# Patient Record
Sex: Female | Born: 1965
Health system: Southern US, Community
[De-identification: ages and names within clinical notes are randomized; demographics above are authoritative.]

## PROBLEM LIST (undated history)

## (undated) DIAGNOSIS — T7840XA Allergy, unspecified, initial encounter: Secondary | ICD-10-CM

## (undated) DIAGNOSIS — M1990 Unspecified osteoarthritis, unspecified site: Secondary | ICD-10-CM

## (undated) DIAGNOSIS — J189 Pneumonia, unspecified organism: Secondary | ICD-10-CM

## (undated) DIAGNOSIS — M797 Fibromyalgia: Secondary | ICD-10-CM

## (undated) DIAGNOSIS — I10 Essential (primary) hypertension: Secondary | ICD-10-CM

## (undated) DIAGNOSIS — E785 Hyperlipidemia, unspecified: Secondary | ICD-10-CM

## (undated) DIAGNOSIS — E119 Type 2 diabetes mellitus without complications: Secondary | ICD-10-CM

## (undated) DIAGNOSIS — H269 Unspecified cataract: Secondary | ICD-10-CM

## (undated) DIAGNOSIS — T884XXA Failed or difficult intubation, initial encounter: Secondary | ICD-10-CM

## (undated) DIAGNOSIS — F32A Depression, unspecified: Secondary | ICD-10-CM

## (undated) DIAGNOSIS — K219 Gastro-esophageal reflux disease without esophagitis: Secondary | ICD-10-CM

## (undated) DIAGNOSIS — E039 Hypothyroidism, unspecified: Secondary | ICD-10-CM

## (undated) DIAGNOSIS — J45909 Unspecified asthma, uncomplicated: Secondary | ICD-10-CM

## (undated) DIAGNOSIS — M5412 Radiculopathy, cervical region: Secondary | ICD-10-CM

## (undated) DIAGNOSIS — Z973 Presence of spectacles and contact lenses: Secondary | ICD-10-CM

## (undated) DIAGNOSIS — A4902 Methicillin resistant Staphylococcus aureus infection, unspecified site: Secondary | ICD-10-CM

## (undated) DIAGNOSIS — Z87440 Personal history of urinary (tract) infections: Secondary | ICD-10-CM

## (undated) DIAGNOSIS — IMO0002 Reserved for concepts with insufficient information to code with codable children: Secondary | ICD-10-CM

## (undated) DIAGNOSIS — R42 Dizziness and giddiness: Secondary | ICD-10-CM

## (undated) DIAGNOSIS — F329 Major depressive disorder, single episode, unspecified: Secondary | ICD-10-CM

## (undated) DIAGNOSIS — Z8659 Personal history of other mental and behavioral disorders: Secondary | ICD-10-CM

## (undated) DIAGNOSIS — R32 Unspecified urinary incontinence: Secondary | ICD-10-CM

## (undated) DIAGNOSIS — Z6841 Body Mass Index (BMI) 40.0 and over, adult: Secondary | ICD-10-CM

## (undated) DIAGNOSIS — R9430 Abnormal result of cardiovascular function study, unspecified: Secondary | ICD-10-CM

## (undated) DIAGNOSIS — F4310 Post-traumatic stress disorder, unspecified: Secondary | ICD-10-CM

## (undated) DIAGNOSIS — R200 Anesthesia of skin: Secondary | ICD-10-CM

## (undated) DIAGNOSIS — G4730 Sleep apnea, unspecified: Secondary | ICD-10-CM

## (undated) DIAGNOSIS — E282 Polycystic ovarian syndrome: Secondary | ICD-10-CM

## (undated) DIAGNOSIS — M810 Age-related osteoporosis without current pathological fracture: Secondary | ICD-10-CM

## (undated) DIAGNOSIS — R251 Tremor, unspecified: Secondary | ICD-10-CM

## (undated) DIAGNOSIS — R51 Headache: Secondary | ICD-10-CM

## (undated) DIAGNOSIS — N3281 Overactive bladder: Secondary | ICD-10-CM

## (undated) DIAGNOSIS — F419 Anxiety disorder, unspecified: Secondary | ICD-10-CM

## (undated) DIAGNOSIS — R000 Tachycardia, unspecified: Secondary | ICD-10-CM

## (undated) DIAGNOSIS — S060X9A Concussion with loss of consciousness of unspecified duration, initial encounter: Secondary | ICD-10-CM

## (undated) HISTORY — DX: Hypothyroidism, unspecified: E03.9

## (undated) HISTORY — DX: Anxiety disorder, unspecified: F41.9

## (undated) HISTORY — DX: Allergy, unspecified, initial encounter: T78.40XA

## (undated) HISTORY — DX: Fibromyalgia: M79.7

## (undated) HISTORY — DX: Depression, unspecified: F32.A

## (undated) HISTORY — DX: Age-related osteoporosis without current pathological fracture: M81.0

## (undated) HISTORY — DX: Tachycardia, unspecified: R00.0

## (undated) HISTORY — PX: COLONOSCOPY W/ BIOPSIES AND POLYPECTOMY: SHX1376

## (undated) HISTORY — PX: CHOLECYSTECTOMY: SHX55

## (undated) HISTORY — PX: CATARACT EXTRACTION, BILATERAL: SHX1313

## (undated) HISTORY — PX: CARPAL TUNNEL RELEASE: SHX101

## (undated) HISTORY — PX: EYE SURGERY: SHX253

## (undated) HISTORY — DX: Dizziness and giddiness: R42

## (undated) HISTORY — DX: Type 2 diabetes mellitus without complications: E11.9

## (undated) HISTORY — PX: BUNIONECTOMY: SHX129

## (undated) HISTORY — DX: Hyperlipidemia, unspecified: E78.5

## (undated) HISTORY — DX: Reserved for concepts with insufficient information to code with codable children: IMO0002

## (undated) HISTORY — PX: WISDOM TOOTH EXTRACTION: SHX21

## (undated) HISTORY — PX: STERIOD INJECTION: SHX5046

## (undated) HISTORY — DX: Major depressive disorder, single episode, unspecified: F32.9

## (undated) HISTORY — PX: TONSILLECTOMY: SUR1361

## (undated) HISTORY — DX: Abnormal result of cardiovascular function study, unspecified: R94.30

---
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
## 1999-04-06 ENCOUNTER — Emergency Department (HOSPITAL_COMMUNITY): Admission: EM | Admit: 1999-04-06 | Discharge: 1999-04-06 | Payer: Self-pay | Admitting: Emergency Medicine

## 1999-07-04 ENCOUNTER — Encounter: Admission: RE | Admit: 1999-07-04 | Discharge: 1999-07-06 | Payer: Self-pay | Admitting: Anesthesiology

## 2000-04-18 ENCOUNTER — Other Ambulatory Visit: Admission: RE | Admit: 2000-04-18 | Discharge: 2000-04-18 | Payer: Self-pay | Admitting: Obstetrics and Gynecology

## 2000-09-05 ENCOUNTER — Encounter (INDEPENDENT_AMBULATORY_CARE_PROVIDER_SITE_OTHER): Payer: Self-pay | Admitting: Specialist

## 2000-09-05 ENCOUNTER — Encounter: Payer: Self-pay | Admitting: Emergency Medicine

## 2000-09-05 ENCOUNTER — Encounter: Payer: Self-pay | Admitting: General Surgery

## 2000-09-05 ENCOUNTER — Inpatient Hospital Stay (HOSPITAL_COMMUNITY): Admission: EM | Admit: 2000-09-05 | Discharge: 2000-09-07 | Payer: Self-pay | Admitting: Emergency Medicine

## 2000-09-06 ENCOUNTER — Encounter: Payer: Self-pay | Admitting: General Surgery

## 2001-04-23 ENCOUNTER — Other Ambulatory Visit: Admission: RE | Admit: 2001-04-23 | Discharge: 2001-04-23 | Payer: Self-pay | Admitting: Obstetrics and Gynecology

## 2001-08-05 ENCOUNTER — Encounter: Payer: Self-pay | Admitting: Internal Medicine

## 2001-08-05 ENCOUNTER — Encounter: Admission: RE | Admit: 2001-08-05 | Discharge: 2001-08-05 | Payer: Self-pay | Admitting: Internal Medicine

## 2001-09-05 ENCOUNTER — Encounter: Admission: RE | Admit: 2001-09-05 | Discharge: 2001-12-04 | Payer: Self-pay | Admitting: Obstetrics and Gynecology

## 2002-01-06 ENCOUNTER — Encounter: Admission: RE | Admit: 2002-01-06 | Discharge: 2002-04-06 | Payer: Self-pay | Admitting: Obstetrics and Gynecology

## 2002-04-28 ENCOUNTER — Other Ambulatory Visit: Admission: RE | Admit: 2002-04-28 | Discharge: 2002-04-28 | Payer: Self-pay | Admitting: Obstetrics and Gynecology

## 2002-10-31 ENCOUNTER — Encounter: Admission: RE | Admit: 2002-10-31 | Discharge: 2002-10-31 | Payer: Self-pay | Admitting: Internal Medicine

## 2002-10-31 ENCOUNTER — Encounter: Payer: Self-pay | Admitting: Internal Medicine

## 2004-08-12 ENCOUNTER — Ambulatory Visit (HOSPITAL_COMMUNITY): Admission: RE | Admit: 2004-08-12 | Discharge: 2004-08-12 | Payer: Self-pay | Admitting: Family Medicine

## 2005-11-29 ENCOUNTER — Other Ambulatory Visit: Admission: RE | Admit: 2005-11-29 | Discharge: 2005-11-29 | Payer: Self-pay | Admitting: Gynecology

## 2007-09-05 ENCOUNTER — Inpatient Hospital Stay (HOSPITAL_COMMUNITY): Admission: EM | Admit: 2007-09-05 | Discharge: 2007-09-07 | Payer: Self-pay | Admitting: Emergency Medicine

## 2009-03-10 ENCOUNTER — Ambulatory Visit (HOSPITAL_COMMUNITY): Admission: RE | Admit: 2009-03-10 | Discharge: 2009-03-10 | Payer: Self-pay | Admitting: Family Medicine

## 2009-05-13 ENCOUNTER — Other Ambulatory Visit: Admission: RE | Admit: 2009-05-13 | Discharge: 2009-05-13 | Payer: Self-pay | Admitting: Family Medicine

## 2009-11-04 ENCOUNTER — Emergency Department (HOSPITAL_COMMUNITY): Admission: EM | Admit: 2009-11-04 | Discharge: 2009-11-04 | Payer: Self-pay | Admitting: Emergency Medicine

## 2010-03-29 ENCOUNTER — Ambulatory Visit (HOSPITAL_COMMUNITY): Admission: RE | Admit: 2010-03-29 | Discharge: 2010-03-29 | Payer: Self-pay | Admitting: Family Medicine

## 2010-06-01 ENCOUNTER — Ambulatory Visit (HOSPITAL_BASED_OUTPATIENT_CLINIC_OR_DEPARTMENT_OTHER): Admission: RE | Admit: 2010-06-01 | Discharge: 2010-06-01 | Payer: Self-pay | Admitting: Orthopedic Surgery

## 2010-09-28 LAB — POCT HEMOGLOBIN-HEMACUE: Hemoglobin: 15 g/dL (ref 12.0–15.0)

## 2010-10-04 LAB — URINALYSIS, ROUTINE W REFLEX MICROSCOPIC
Bilirubin Urine: NEGATIVE
Glucose, UA: NEGATIVE mg/dL
Specific Gravity, Urine: 1.019 (ref 1.005–1.030)
Urobilinogen, UA: 0.2 mg/dL (ref 0.0–1.0)

## 2010-10-04 LAB — DIFFERENTIAL
Basophils Absolute: 0 10*3/uL (ref 0.0–0.1)
Basophils Relative: 0 % (ref 0–1)
Eosinophils Absolute: 0.1 10*3/uL (ref 0.0–0.7)
Eosinophils Relative: 2 % (ref 0–5)
Lymphocytes Relative: 24 % (ref 12–46)
Lymphs Abs: 1.8 10*3/uL (ref 0.7–4.0)
Monocytes Absolute: 0.5 10*3/uL (ref 0.1–1.0)
Monocytes Relative: 7 % (ref 3–12)

## 2010-10-04 LAB — POCT I-STAT, CHEM 8
Calcium, Ion: 1.19 mmol/L (ref 1.12–1.32)
HCT: 44 % (ref 36.0–46.0)

## 2010-10-04 LAB — CBC
RBC: 4.61 MIL/uL (ref 3.87–5.11)
WBC: 7.6 10*3/uL (ref 4.0–10.5)

## 2010-10-04 LAB — URINE MICROSCOPIC-ADD ON
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
## 2010-12-02 NOTE — Op Note (Signed)
[place]. [facility]  Patient:    [name], [name]                       MRN: [id number] Proc. Date: 09/05/00 Adm. Date:  [id number] Attending:  Cherylynn Ridges                           Operative Report  PREOPERATIVE DIAGNOSIS:  Acute cholecystitis and biliary colic.  POSTOPERATIVE DIAGNOSIS:  Acute cholecystitis and biliary colic with retained common bile duct stone in the distal common bile duct.  OPERATION PERFORMED:  Laparoscopic cholecystectomy with intraoperative cholangiogram.  SURGEON:  Jimmye Norman, M.D.  ASSISTANT:  Donnie Coffin. Samuella Cota, M.D.  ANESTHESIA:  General endotracheal.  ESTIMATED BLOOD LOSS:  Less than 28 to 25 cc.  COMPLICATIONS:  Retained common bile duct stone with obstruction of the distal common bile duct.  CONDITION:  Stable.  INDICATIONS FOR PROCEDURE:  The patient is 35.  Has abdominal pain with mildly elevated liver function tests.  She now comes in for laparoscopic cholecystectomy through the emergency room.  FINDINGS:  The patients gallbladder was mildly edematous and large, was perforated during extrication.  Intraoperative cholangiogram demonstrates a distal common duct stone with meniscus sign and no entry into the duodenum.  DESCRIPTION OF PROCEDURE:  The patient was taken to the operating room and placed on the table in supine position.  After adequate general anesthetic was administered, she was prepped and draped in the usual sterile manner exposing the midline and the right upper quadrant of the abdomen.  She was placed in reverse Trendelenburg position and a curvilinear superumbilical incision was made down to the midline fascia.  Through this fascia Veress needle was passed into the peritoneal cavity and confirmed to be in position using saline test. Subsequently, carbon dioxide insufflation up to a maximum intra-abdominal pressure 15 mmHg was instilled into the peritoneal cavity.  Once this was done a 10-11 mm  trocar and cannula was passed through the superumbilical fascia into the peritoneal cavity and confirmed to be in adequate position using the laparoscope with attached camera and light source.  Once this was done, two right costal margin 5 mm cannulas and a subxiphoid 10-11 mm cannula were passed into the peritoneal cavity under direct vision.  Once this was done, the patient was placed in steeper reversed Trendelenburg position.  The left side was tilted down and the dissection begun.  The dome of the gallbladder was grasped and retracted to the anterior abdominal wall towards the right upper quadrant.  There were some lateral omental adhesions to the liver edge which were take down using electrocautery. We dissected out the infundibulum and the peritoneum overlying the triangle of Calot and the hepatoduodenal triangle in order to expose the cystic duct and the cystic artery.  The cystic duct did appear to be mildly enlarged at approximately 8 to 9 mm in size and just the full length of an Endoclip.  We were able to dissect out the duct and after we had done so, we placed a clip on the proximal part of the duct and then subsequently performed a cholecystodochotomy in order to pass a Reddick catheter for cholangiogram. The Reddick catheter was passed through a catheter on the anterior abdominal wall, passed into the cholecystodochotomy and cholangiogram performed.  This demonstrated the lack of flow into the duodenum and the meniscus sign of the distal common bile duct.  The  common bile duct itself did not appear to be markedly enlarged.  The proximal biliary tree appeared to be normal.  This appeared to be an impacted stone and one that would not be amenable to endoscopic removal. Therefore the decision was made to triply clip the cystic duct which was done after removing the Reddick catheter.  We then transected and dissected out the cystic artery which was endoclipped proximally  and distally.  Then we dissected the gallbladder out of its bed using electrocautery.  There was entry into the gallbladder during this procedure with drainage of mucous bile but no pus.  We irrigated out most of the biliary drainage from the gallbladder.  Then we passed an Endocatch back into the peritoneal cavity which we used in order to remove the gallbladder.  This was done with minimal difficulty and no need to enlarge the incisions.  Once we had done so, we inspected the gallbladder bed for hemostasis and it appeared to be adequately clotted.  We irrigated with 2L of saline and subsequently removed all cannulas allowing the gas to escape through the largest cannula.   We reapproximated the fascia at the superumbilical site using a figure-of-eight stitch of 0 Vicryl passed on a UR-6 needle.  Once this was done, the skin was closed using running subcuticular suture of 4-0 Vicryl. Sterile dressings were applied.  Sponge, needle and instrument counts were correct. DD:  09/05/00 TD:  09/05/00 Job: [id number]

## 2010-12-02 NOTE — Discharge Summary (Signed)
[place]. [facility]  Patient:    [name], [name]                       MRN: [id number] Adm. Date:  [id number] Disc. Date: [id number] Attending:  Cherylynn Ridges CC:         Barbette Hair. Arlyce Dice, M.D. [facility]   Discharge Summary  DISCHARGE DIAGNOSES: 1. Acute cholecystitis with common bile duct stone. 2. Mild cholangitis.  PRINCIPAL PROCEDURES:  Laparoscopic cholecystectomy with intraoperative cholangiogram and postoperative endoscopic retrograde cholangiopancreatography with retrieval of common bile duct stone.  GASTROENTEROLOGIST:  Barbette Hair. Arlyce Dice, M.D.  DISCHARGE MEDICATIONS:  She was discharged to home on Vicodin one to two every four hours p.r.n. for pain.  FOLLOW-UP:  To see me in two weeks.  Also, she had a follow-up with Barbette Hair. Arlyce Dice, M.D.  BRIEF SUMMARY OF HOSPITAL COURSE:  The patient was admitted through the emergency room with abdominal pain over the last four to five days, associated with nausea and vomiting.  No fever or chills.  She is a heavy-set lady.  She was admitted for cholecystectomy.  Her SGOT and SGPT were elevated, but the alkaline phosphatase and total bilirubin were normal.  She was taken to surgery where a laparoscopic cholecystectomy was performed uneventfully. Intraoperative cholangiogram demonstrated a distal common duct obstruction. She received a postoperative ERCP by Barbette Hair. Arlyce Dice, M.D., that retrieved with stone.  She did well subsequently and was discharged to home in two days. DD:  10/27/00 TD:  10/27/00 Job: [id number]

## 2010-12-02 NOTE — H&P (Signed)
[place]. [facility]  Patient:    [name], [name]                       MRN: [id number] Adm. Date:  [id number] Attending:  Cherylynn Ridges                         History and Physical  IDENTIFICATION/CHIEF COMPLAINT:  The patient is a 45 year old female with symptomatic cholelithiasis, possible subacute cholecystitis.  HISTORY OF PRESENT ILLNESS:  This is the fourth or fifth attack over the last several days of acute, severe right upper quadrant pain presenting in the same way with severe pain while the patient was asleep and awakening her early in the morning.  She has had nausea and vomiting associated with that, along with some possible chills but no documented fevers.  She has had no jaundice but did have some diarrhea yesterday which was bilious.  Her bowel movement today was normal. Her last meal, however, was yesterday evening at 7 p.m.  She has had no previous diagnosis of gallbladder disease; however, an ultrasound today showed multiple gallstones in her gallbladder along with a thickened gallbladder wall.  Her ducts were normal.  A surgical consultation was obtained after the ultrasound results were brought out.  PAST MEDICAL HISTORY: 1. Infertility. 2. She is hypothyroid. 3. Obesity.  MEDICATIONS: 1. Glucophage 500 mg p.o. b.i.d. - which she is apparently taking for    infertility, not for diabetes. 2. Synthroid. 3. Prenatal vitamins.  GYNECOLOGIC HISTORY:  She is currently not pregnant.  We will send a beta HCG but she feels as though she is not pregnant.  She has had infertility problems for years.  ALLERGIES:  PREDNISONE - she gets a rash.  REVIEW OF SYSTEMS:  She has had no constipation but did have diarrhea yesterday, possible chills.  No fevers documented, no jaundice, no blood in her urine.  She is currently on her period.  PHYSICAL EXAMINATION:  GENERAL:  She is a well-nourished obese female in mild acute distress but tender  in the right upper quadrant.  HEENT:  She is normocephalic and atraumatic.  VITAL SIGNS:  Stable.  She is afebrile.  NECK:  Supple.  No palpable masses, no bruits.  CHEST:  Clear to auscultation.  CARDIAC:  Regular rhythm and rate with no murmurs, gallops, lifts, or heaves.  ABDOMEN:  Soft but she is very tender in the right upper quadrant epigastric area with no palpable masses, no rebound or guarding.  She has hypoactive bowel sounds.  RECTAL AND PELVIC:  Deferred.  LABORATORY STUDIES:  Show a white count of 11.8, hematocrit 41%, platelet count normal.  Liver function tests are abnormal with an SGOT of 115, SGPT 88. Total bili is normal at 0.7.  Electrolytes are normal.  A UA is pending along with a urine beta HCG.  She does not have a chest x-ray or EKG.  She is a nonsmoker and nondrinker and has no risk factors for cardiac disease outside of her obesity.  IMPRESSION:  Symptomatic cholelithiasis, possible subacute cholecystitis in an obese 45 year old female with abdominal pain.  Ultrasound demonstrates a thickened gallbladder wall along with stones.  With this scenario, likely she has acute gallbladder disease that requires operative intervention.  Her obesity presents somewhat of a problem logistically and surgically; however, I suspect she should be a good candidate for a laparoscopic cholecystectomy.  It is unsure whether or not  she really has diabetes.  Will check a hemoglobin A1c.  PLAN:  Admit the patient, give her IV antibiotics, and take her to surgery for a laparoscopic cholecystectomy.  Intraoperative cholangiograms will be performed at the time of the laparoscopic cholecystectomy.  There is a possibility we may need to open for this procedure because of the amount of inflammation of the gallbladder. DD:  09/05/00 TD:  09/05/00 Job: [id number]

## 2010-12-19 ENCOUNTER — Inpatient Hospital Stay (INDEPENDENT_AMBULATORY_CARE_PROVIDER_SITE_OTHER)
Admission: RE | Admit: 2010-12-19 | Discharge: 2010-12-19 | Disposition: A | Discharge status: Home / Self Care | Payer: 59 | Source: Ambulatory Visit | Attending: Family Medicine | Admitting: Family Medicine

## 2010-12-19 DIAGNOSIS — L255 Unspecified contact dermatitis due to plants, except food: Secondary | ICD-10-CM

## 2011-02-09 ENCOUNTER — Inpatient Hospital Stay (INDEPENDENT_AMBULATORY_CARE_PROVIDER_SITE_OTHER)
Admission: RE | Admit: 2011-02-09 | Discharge: 2011-02-09 | Disposition: A | Discharge status: Home / Self Care | Payer: 59 | Source: Ambulatory Visit | Attending: Family Medicine | Admitting: Family Medicine

## 2011-02-09 DIAGNOSIS — M549 Dorsalgia, unspecified: Secondary | ICD-10-CM

## 2011-04-10 LAB — CBC
HCT: 37.3
Hemoglobin: 8.3 — ABNORMAL LOW
MCHC: 34.2
MCV: 84.7
Platelets: 291
RBC: 2.84 — ABNORMAL LOW
RBC: 4.4
RDW: 14.7
WBC: 9.3

## 2011-04-10 LAB — TROPONIN I: Troponin I: 0.02

## 2011-04-10 LAB — DIFFERENTIAL
Basophils Absolute: 0
Lymphocytes Relative: 22
Lymphs Abs: 2
Monocytes Absolute: 0.6
Monocytes Relative: 6
Neutro Abs: 6.5
Neutrophils Relative %: 70

## 2011-04-10 LAB — CK TOTAL AND CKMB (NOT AT ARMC)
CK, MB: 3.5
Relative Index: 2.7 — ABNORMAL HIGH
Total CK: 131

## 2011-04-10 LAB — I-STAT 8, (EC8 V) (CONVERTED LAB)
Acid-Base Excess: 2
Bicarbonate: 23.8
Hemoglobin: 13.9
Potassium: 3.8
Sodium: 137
TCO2: 25
pCO2, Ven: 30.3 — ABNORMAL LOW

## 2011-04-10 LAB — CROSSMATCH: ABO/RH(D): O NEG

## 2011-04-10 LAB — BASIC METABOLIC PANEL
CO2: 28
Calcium: 7.7 — ABNORMAL LOW
Creatinine, Ser: 0.8
GFR calc Af Amer: >60
GFR calc non Af Amer: >60
Sodium: 133 — ABNORMAL LOW

## 2011-05-11 ENCOUNTER — Emergency Department (HOSPITAL_COMMUNITY): Payer: 59

## 2011-05-11 ENCOUNTER — Emergency Department (HOSPITAL_COMMUNITY)
Admission: EM | Admit: 2011-05-11 | Discharge: 2011-05-11 | Disposition: A | Discharge status: Home / Self Care | Payer: 59 | Attending: Emergency Medicine | Admitting: Emergency Medicine

## 2011-05-11 DIAGNOSIS — E785 Hyperlipidemia, unspecified: Secondary | ICD-10-CM | POA: Insufficient documentation

## 2011-05-11 DIAGNOSIS — R42 Dizziness and giddiness: Secondary | ICD-10-CM | POA: Insufficient documentation

## 2011-05-11 DIAGNOSIS — E039 Hypothyroidism, unspecified: Secondary | ICD-10-CM | POA: Insufficient documentation

## 2011-05-11 DIAGNOSIS — M79609 Pain in unspecified limb: Secondary | ICD-10-CM | POA: Insufficient documentation

## 2011-05-11 DIAGNOSIS — F329 Major depressive disorder, single episode, unspecified: Secondary | ICD-10-CM | POA: Insufficient documentation

## 2011-05-11 DIAGNOSIS — F3289 Other specified depressive episodes: Secondary | ICD-10-CM | POA: Insufficient documentation

## 2011-05-11 DIAGNOSIS — R072 Precordial pain: Secondary | ICD-10-CM | POA: Insufficient documentation

## 2011-05-11 DIAGNOSIS — R799 Abnormal finding of blood chemistry, unspecified: Secondary | ICD-10-CM | POA: Insufficient documentation

## 2011-05-11 DIAGNOSIS — Z79899 Other long term (current) drug therapy: Secondary | ICD-10-CM | POA: Insufficient documentation

## 2011-05-11 LAB — URINALYSIS, ROUTINE W REFLEX MICROSCOPIC
Nitrite: NEGATIVE
Specific Gravity, Urine: 1.024 (ref 1.005–1.030)
Urobilinogen, UA: 1 mg/dL (ref 0.0–1.0)
pH: 6 (ref 5.0–8.0)

## 2011-05-11 LAB — CBC
HCT: 43.1 % (ref 36.0–46.0)
Platelets: 213 10*3/uL (ref 150–400)
RBC: 4.79 MIL/uL (ref 3.87–5.11)
RDW: 14 % (ref 11.5–15.5)
WBC: 10.3 10*3/uL (ref 4.0–10.5)

## 2011-05-11 LAB — POCT I-STAT TROPONIN I
Troponin i, poc: 0 ng/mL (ref 0.00–0.08)
Troponin i, poc: 0 ng/mL (ref 0.00–0.08)

## 2011-05-11 LAB — DIFFERENTIAL
Basophils Absolute: 0 10*3/uL (ref 0.0–0.1)
Eosinophils Absolute: 0.1 10*3/uL (ref 0.0–0.7)
Eosinophils Relative: 1 % (ref 0–5)
Lymphocytes Relative: 24 % (ref 12–46)
Lymphs Abs: 2.4 10*3/uL (ref 0.7–4.0)
Neutrophils Relative %: 66 % (ref 43–77)

## 2011-05-11 LAB — BASIC METABOLIC PANEL
Chloride: 102 mEq/L (ref 96–112)
GFR calc Af Amer: >90 mL/min (ref >90)
GFR calc non Af Amer: >90 mL/min (ref >90)
Potassium: 3.5 mEq/L (ref 3.5–5.1)
Sodium: 138 mEq/L (ref 135–145)

## 2011-05-11 LAB — URINE MICROSCOPIC-ADD ON

## 2011-05-11 MED ORDER — IOHEXOL 300 MG/ML  SOLN
100.0000 mL | Freq: Once | INTRAMUSCULAR | Status: AC | PRN
  Administered 2011-05-11: 100 mL via INTRAVENOUS

## 2011-05-15 ENCOUNTER — Encounter: Payer: Self-pay | Admitting: Cardiology

## 2011-05-15 DIAGNOSIS — E785 Hyperlipidemia, unspecified: Secondary | ICD-10-CM | POA: Insufficient documentation

## 2011-05-15 DIAGNOSIS — M797 Fibromyalgia: Secondary | ICD-10-CM | POA: Insufficient documentation

## 2011-05-15 DIAGNOSIS — F329 Major depressive disorder, single episode, unspecified: Secondary | ICD-10-CM | POA: Insufficient documentation

## 2011-05-15 DIAGNOSIS — F419 Anxiety disorder, unspecified: Secondary | ICD-10-CM | POA: Insufficient documentation

## 2011-05-15 DIAGNOSIS — R000 Tachycardia, unspecified: Secondary | ICD-10-CM | POA: Insufficient documentation

## 2011-05-15 DIAGNOSIS — F32A Depression, unspecified: Secondary | ICD-10-CM | POA: Insufficient documentation

## 2011-05-15 DIAGNOSIS — E039 Hypothyroidism, unspecified: Secondary | ICD-10-CM | POA: Insufficient documentation

## 2011-05-15 DIAGNOSIS — R42 Dizziness and giddiness: Secondary | ICD-10-CM | POA: Insufficient documentation

## 2011-05-16 ENCOUNTER — Ambulatory Visit (INDEPENDENT_AMBULATORY_CARE_PROVIDER_SITE_OTHER): Payer: 59

## 2011-05-16 ENCOUNTER — Inpatient Hospital Stay (INDEPENDENT_AMBULATORY_CARE_PROVIDER_SITE_OTHER)
Admission: RE | Admit: 2011-05-16 | Discharge: 2011-05-16 | Disposition: A | Discharge status: Home / Self Care | Payer: 59 | Source: Ambulatory Visit | Attending: Family Medicine | Admitting: Family Medicine

## 2011-05-16 DIAGNOSIS — S93409A Sprain of unspecified ligament of unspecified ankle, initial encounter: Secondary | ICD-10-CM

## 2011-05-17 ENCOUNTER — Encounter: Payer: Self-pay | Admitting: *Deleted

## 2011-05-18 ENCOUNTER — Encounter: Payer: Self-pay | Admitting: Cardiology

## 2011-05-18 ENCOUNTER — Ambulatory Visit (INDEPENDENT_AMBULATORY_CARE_PROVIDER_SITE_OTHER): Payer: 59 | Admitting: Cardiology

## 2011-05-18 DIAGNOSIS — E039 Hypothyroidism, unspecified: Secondary | ICD-10-CM

## 2011-05-18 DIAGNOSIS — R079 Chest pain, unspecified: Secondary | ICD-10-CM

## 2011-05-18 NOTE — Progress Notes (Signed)
HPI Thjs very active patient is here for evaluation of chest pain. Several days ago she noted anterior chest discomfort.  It seems to stay with her continuously. It does not appear to be related to exertion or position.  There is a family history of coronary disease.  The patient is overweight. The patient did have a chest CT revealing no pulmonary embolus.  As part of the evaluation today I reviewed the records sent to me by the patient's primary physician.  Also review the hospital records. Allergies  Allergen Reactions  . Prednisolone     Current Outpatient Prescriptions  Medication Sig Dispense Refill  . clonazePAM (KLONOPIN) 0.5 MG tablet Take 0.5 mg by mouth 2 (two) times daily as needed.        . cyclobenzaprine (FLEXERIL) 10 MG tablet Take 10 mg by mouth 3 (three) times daily as needed.        . DULoxetine (CYMBALTA) 60 MG capsule Take 60 mg by mouth daily.        Marland Kitchen levothyroxine (SYNTHROID, LEVOTHROID) 150 MCG tablet Take 150 mcg by mouth daily.        Marland Kitchen lidocaine (LIDODERM) 5 % Place 1 patch onto the skin daily. Remove & Discard patch within 12 hours or as directed by MD       . loratadine (CLARITIN) 10 MG tablet Take 10 mg by mouth daily.        . naproxen (NAPROSYN) 500 MG tablet Take 500 mg by mouth 2 (two) times daily as needed.        . solifenacin (VESICARE) 5 MG tablet Take 5 mg by mouth daily.       . traMADol (ULTRAM) 50 MG tablet Take 50 mg by mouth every 6 (six) hours as needed. Maximum dose= 8 tablets per day         History   Social History  . Marital Status: Married    Spouse Name: N/A    Number of Children: 0  . Years of Education: N/A   Occupational History  . [employer]   Social History Main Topics  . Smoking status: Never Smoker   . Smokeless tobacco: Not on file  . Alcohol Use: No  . Drug Use: No  . Sexually Active: Not on file   Other Topics Concern  . Not on file   Social History Narrative  . No narrative on  file    Family History  Problem Relation Age of Onset  . Adopted: Yes  . Stroke      Past Medical History  Diagnosis Date  . Chest pain   . Tachycardia   . Anxiety   . Dyslipidemia   . Hypothyroidism   . Depression   . Fibromyalgia   . Dizziness     Past Surgical History  Procedure Date  . Wisdom tooth extraction   . Carpal tunnel release   . Cholecystectomy     ROS  Patient denies fever, chills, headache, sweats, rash, change in vision, change in hearing, cough, nausea vomiting, urinary symptoms.  All other systems are reviewed and are negative. PHYSICAL EXAM Patient is overweight.  Head is atraumatic.  There is no xanthelasma.  No jugular venous distention.  No carotid bruits.  Lungs are clear.  Respiratory effort is not labored.  Cardiac exam reveals S1-S2.  No clicks or significant murmurs.  The abdomen is soft.  There is no peripheral edema.  There are no musculoskeletal deformities.  No skin  rashes. Filed Vitals:   05/18/11 1538  BP: 110/78  Pulse: 102  Height: 5\' 3"  (1.6 m)  Weight: 273 lb (123.832 kg)  SpO2: 97%    EKG is not done today.  I have reviewed the prior tracing and I reviewed the tracing from [facility].  There no significant abnormalities.  ASSESSMENT & PLAN

## 2011-05-18 NOTE — Assessment & Plan Note (Signed)
The patient continues to have chest pain.  We know that she had a CT angiogram of her chest showed no pulmonary embolus.  We need to proceed onward to be sure that there is no evidence of significant coronary disease.  Two-dimensional echo will be very helpful to be sure that she does not have any unusual type of cardiomyopathy.  Pharmacologic stress nuclear scan will help to be sure that there is no sign of ischemia.  This will be ordered and arranged for some period

## 2011-05-18 NOTE — Patient Instructions (Signed)
Your physician recommends that you schedule a follow-up appointment in: 05/31/11 at 9:30am  Your physician has requested that you have an echocardiogram. Echocardiography is a painless test that uses sound waves to create images of your heart. It provides your doctor with information about the size and shape of your heart and how well your heart's chambers and valves are working. This procedure takes approximately one hour. There are no restrictions for this procedure.   Your physician has requested that you have a lexiscan myoview. For further information please visit [email or web address] Please follow instruction sheet, as given.

## 2011-05-18 NOTE — Assessment & Plan Note (Signed)
The patient's thyroid status and being treated.

## 2011-05-23 ENCOUNTER — Ambulatory Visit (HOSPITAL_COMMUNITY)
Admission: RE | Admit: 2011-05-23 | Discharge: 2011-05-23 | Disposition: A | Discharge status: Home / Self Care | Payer: 59 | Attending: Psychiatry | Admitting: Psychiatry

## 2011-05-23 DIAGNOSIS — F3289 Other specified depressive episodes: Secondary | ICD-10-CM | POA: Insufficient documentation

## 2011-05-23 DIAGNOSIS — F329 Major depressive disorder, single episode, unspecified: Secondary | ICD-10-CM | POA: Insufficient documentation
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
## 2011-05-24 ENCOUNTER — Ambulatory Visit (HOSPITAL_COMMUNITY): Payer: 59 | Attending: Cardiovascular Disease | Admitting: Radiology

## 2011-05-24 ENCOUNTER — Other Ambulatory Visit (HOSPITAL_COMMUNITY): Payer: 59

## 2011-05-24 DIAGNOSIS — R0609 Other forms of dyspnea: Secondary | ICD-10-CM | POA: Insufficient documentation

## 2011-05-24 DIAGNOSIS — R42 Dizziness and giddiness: Secondary | ICD-10-CM | POA: Insufficient documentation

## 2011-05-24 DIAGNOSIS — R5381 Other malaise: Secondary | ICD-10-CM | POA: Insufficient documentation

## 2011-05-24 DIAGNOSIS — R0989 Other specified symptoms and signs involving the circulatory and respiratory systems: Secondary | ICD-10-CM | POA: Insufficient documentation

## 2011-05-24 DIAGNOSIS — Z8249 Family history of ischemic heart disease and other diseases of the circulatory system: Secondary | ICD-10-CM | POA: Insufficient documentation

## 2011-05-24 DIAGNOSIS — R0789 Other chest pain: Secondary | ICD-10-CM | POA: Insufficient documentation

## 2011-05-24 DIAGNOSIS — E785 Hyperlipidemia, unspecified: Secondary | ICD-10-CM | POA: Insufficient documentation

## 2011-05-24 DIAGNOSIS — R000 Tachycardia, unspecified: Secondary | ICD-10-CM | POA: Insufficient documentation

## 2011-05-24 MED ORDER — TECHNETIUM TC 99M TETROFOSMIN IV KIT
33.0000 | PACK | Freq: Once | INTRAVENOUS | Status: AC | PRN
  Administered 2011-05-24: 33 via INTRAVENOUS

## 2011-05-24 MED ORDER — REGADENOSON 0.4 MG/5ML IV SOLN
0.4000 mg | Freq: Once | INTRAVENOUS | Status: AC
  Administered 2011-05-24: 0.4 mg via INTRAVENOUS

## 2011-05-24 NOTE — Progress Notes (Signed)
[facility] SITE 3 NUCLEAR MED [address] [place] [address] [phone]  Cardiology Nuclear Med Study  [name] is a 45 y.o. female [id number] 09/25/1965   Nuclear Med Background Indication for Stress Test:  Evaluation for Ischemia and Post Hospital ER 05/11/11 CP, Neg. Enzymes History:  No previous documented CAD Cardiac Risk Factors: Family History - CAD and Lipids  Symptoms:  Chest Pain and Chest Pressure ar rest, continuous since 05/11/11 per patient, Dizziness, DOE, Fatigue, Fatigue with Exertion, Light-Headedness and Rapid HR   Nuclear Pre-Procedure Caffeine/Decaff Intake:  None NPO After: 11AM   Lungs:  Clear IV 0.9% NS with Angio Cath:  22g  IV Site: R Antecubital  IV Started by:  Bonnita Levan, RN  Chest Size (in):  52 Cup Size: D  Height: 5\' 3"  (1.6 m)  Weight:  272 lb (123.378 kg)  BMI:  Body mass index is 48.18 kg/(m^2). Tech Comments:  N/A    Nuclear Med Study 1 or 2 day study: 2 day  Stress Test Type:  Treadmill/Lexiscan  Reading MD: Charlton Haws, MD  Order Authorizing Provider:  Willa Rough, MD  Resting Radionuclide: Technetium 24m Tetrofosmin  Resting Radionuclide Dose: 33.0 mCi   Stress Radionuclide:  Technetium 41m Tetrofosmin  Stress Radionuclide Dose: 33.0 mCi           Stress Protocol Rest HR: 96 Stress HR: 144  Rest BP: 114/74 Stress BP: 124/51  Exercise Time (min): 2:00 METS: n/a   Predicted Max HR: 176 bpm % Max HR: 81.82 bpm Rate Pressure Product: 69629   Dose of Adenosine (mg):  n/a Dose of Lexiscan: 0.4 mg  Dose of Atropine (mg): n/a Dose of Dobutamine: n/a mcg/kg/min (at max HR)  Stress Test Technologist: Irean Hong, RN  Nuclear Technologist:  Domenic Polite, CNMT     Rest Procedure:  Myocardial perfusion imaging was performed at rest 45 minutes following the intravenous administration of Technetium 25m Tetrofosmin. Rest ECG: NSR with Poor R wave Progression  Stress Procedure:  The patient  received IV Lexiscan 0.4 mg over 15-seconds with concurrent low level exercise. Technetium 3m Tetrofosmin was injected at 30-seconds while the patient continued walking.There were no significant changes with Lexiscan.  Quantitative spect images were obtained after a 45-minute delay. Stress ECG: No significant change from baseline ECG  QPS Raw Data Images:  Normal; no motion artifact; normal heart/lung ratio. Stress Images:  No diagnostic abnormality Rest Images:  Same as stress. Subtraction (SDS):  No evidence of ischemia. Transient Ischemic Dilatation (Normal <1.22):  1.00 Lung/Heart Ratio (Normal <0.45):  0.28  Quantitative Gated Spect Images QGS EDV:  96 ml QGS ESV:  42 ml QGS cine images:  Normal Wall Motion QGS EF: 56%  Impression Exercise Capacity:  Lexiscan with low level exercise. BP Response:  Normal blood pressure response. Clinical Symptoms:  DOE ECG Impression:  No significant ST segment change suggestive of ischemia. Comparison with Prior Nuclear Study: No previous nuclear study performed  Overall Impression:  Normal stress nuclear study.  Willa Rough

## 2011-05-25 ENCOUNTER — Encounter (HOSPITAL_COMMUNITY): Payer: Self-pay | Admitting: Psychiatry

## 2011-05-25 ENCOUNTER — Other Ambulatory Visit (HOSPITAL_COMMUNITY): Payer: 59 | Admitting: Psychiatry

## 2011-05-25 DIAGNOSIS — F411 Generalized anxiety disorder: Secondary | ICD-10-CM

## 2011-05-25 DIAGNOSIS — F331 Major depressive disorder, recurrent, moderate: Secondary | ICD-10-CM | POA: Insufficient documentation

## 2011-05-25 NOTE — Progress Notes (Signed)
    Daily Group Progress Note  Program: IOP  Group Time: 9:00-10:30 am   Participation Level: Minimal  Behavioral Response: Appropriate  Type of Therapy:  Process Group  Summary of Progress: Today was the patients first day in the group. She was introduced, appeared attentive and observed the group process.      Group Time: 10:30 am - 12:00 pm   Participation Level:  Active  Behavioral Response: Appropriate, Sharing, Motivated and Assertive  Type of Therapy: Education and Training Group  Summary of Progress: Patient actively participated in a discussion and education segment on healthy boundaries. Pt identified personal examples where firmer limits need to be implemented for patients needs to be met.   Maxcine Ham, LCSW
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
## 2011-05-26 ENCOUNTER — Other Ambulatory Visit (HOSPITAL_COMMUNITY): Payer: 59 | Attending: Psychiatry

## 2011-05-26 NOTE — Progress Notes (Signed)
    Daily Group Progress Note  Program: IOP  Group Time:  0900 - 1030  Participation Level: Active  Behavioral Response: Appropriate, Sharing, Care-Taking and Motivated  Type of Therapy:  Process Group  Summary of Progress: Juel Burrow shared the pain of multiple losses in the past 3 years, while accepting the compassion of the group for her losses.  She focused on the loss of a man, neighbor, "who was like a brother" in Jan of 2012, stating she "can't get over it".  She used the opportunity to express her sorrow and her lack of support, even though she told us about her mother and husband as being supportive.  Financial concerns are prominent in spite of the fact that she has inherited the man's farm property and house.  She engaged easily with the group and was well accepted.    Group Time: 1045-1200  Participation Level:  Active  Behavioral Response: Appropriate, Sharing, Motivated and Assertive  Type of Therapy: Psycho-education Group  Summary of Progress: Participated in guided visualization exercise with the opportunity to find an object of significance.  Method explored as a strategy to enhance self-awareness and self-care.   Shonna Chock, APRN, CNS

## 2011-05-29 ENCOUNTER — Encounter: Payer: Self-pay | Admitting: Cardiology

## 2011-05-29 ENCOUNTER — Other Ambulatory Visit (HOSPITAL_COMMUNITY): Payer: 59

## 2011-05-29 ENCOUNTER — Ambulatory Visit (HOSPITAL_COMMUNITY): Payer: 59 | Attending: Cardiology | Admitting: Radiology

## 2011-05-29 ENCOUNTER — Encounter (HOSPITAL_COMMUNITY): Payer: 59 | Admitting: Radiology

## 2011-05-29 ENCOUNTER — Ambulatory Visit (HOSPITAL_BASED_OUTPATIENT_CLINIC_OR_DEPARTMENT_OTHER): Payer: 59 | Admitting: Radiology

## 2011-05-29 DIAGNOSIS — E039 Hypothyroidism, unspecified: Secondary | ICD-10-CM | POA: Insufficient documentation

## 2011-05-29 DIAGNOSIS — R072 Precordial pain: Secondary | ICD-10-CM | POA: Insufficient documentation

## 2011-05-29 DIAGNOSIS — R0989 Other specified symptoms and signs involving the circulatory and respiratory systems: Secondary | ICD-10-CM

## 2011-05-29 DIAGNOSIS — R9430 Abnormal result of cardiovascular function study, unspecified: Secondary | ICD-10-CM | POA: Insufficient documentation

## 2011-05-29 MED ORDER — TECHNETIUM TC 99M TETROFOSMIN IV KIT: 33.0000 | PACK | Freq: Once | INTRAVENOUS | Status: AC | PRN
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
## 2011-05-29 NOTE — Progress Notes (Signed)
    Daily Group Progress Note  Program: IOP  Group Time: 9:00-10:30 am  Participation Level: Minimal  Behavioral Response: Appropriate, Sharing and Motivated  Type of Therapy:  Process Group  Summary of Progress:  Patient was present for the first 30 minutes of group due to having a pre-approved doctor's visit.  Patient expressed an interest in participating for the limited amount of time due to benefiting from the group process.   Group Time: 10:30 am - 12:00 pm  Participation Level:  None  Behavioral Response: Patient did not attend due to not being back from her medical appointment.  Type of Therapy: Grief and Loss  Summary of Progress: Patient did not attend due to not being back from her medication appointment.  Cleophas Dunker, LMFT, CTS

## 2011-05-30 ENCOUNTER — Other Ambulatory Visit (HOSPITAL_COMMUNITY): Payer: 59

## 2011-05-30 NOTE — Progress Notes (Signed)
    Daily Group Progress Note  Program: IOP  Group Time: 9:00-10:30 am   Participation Level: Active  Behavioral Response: Appropriate  Type of Therapy:  Process Group  Summary of Progress: Patient processed feelings of depression and identified how Pt manages uncomfortable feelings while receiving support and encouragement from the group.      Group Time: 10:30 am - 12:00 pm   Participation Level:  Active  Behavioral Response: Appropriate  Type of Therapy: Psycho-education Group  Summary of Progress: Pt participated in a discussion on thoughts, feelings and behaviors and identifying how to distinguish between them and manage difficult feelings.   Dariana Garbett, LCSW 

## 2011-05-31 ENCOUNTER — Ambulatory Visit (INDEPENDENT_AMBULATORY_CARE_PROVIDER_SITE_OTHER): Payer: 59 | Admitting: Cardiology

## 2011-05-31 ENCOUNTER — Encounter: Payer: Self-pay | Admitting: Cardiology

## 2011-05-31 ENCOUNTER — Other Ambulatory Visit (HOSPITAL_COMMUNITY): Payer: 59

## 2011-05-31 ENCOUNTER — Ambulatory Visit: Payer: 59 | Admitting: Cardiology

## 2011-05-31 DIAGNOSIS — R079 Chest pain, unspecified: Secondary | ICD-10-CM | POA: Insufficient documentation

## 2011-05-31 DIAGNOSIS — F419 Anxiety disorder, unspecified: Secondary | ICD-10-CM

## 2011-05-31 NOTE — Patient Instructions (Signed)
Your physician recommends that you schedule a follow-up appointment in: as needed  

## 2011-05-31 NOTE — Assessment & Plan Note (Signed)
The patient's chest pain is improving.  There is no evidence of significant cardiac abnormalities.  She is getting help with her stress.  No further cardiac workup.

## 2011-05-31 NOTE — Assessment & Plan Note (Signed)
The patient has significant stress.  She is having this evaluated and doing well.

## 2011-05-31 NOTE — Assessment & Plan Note (Signed)
The patient has no evidence of significant cardiac disease.  Her nuclear scan reveals no definite ischemia.  No further workup is needed.

## 2011-05-31 NOTE — Progress Notes (Signed)
HPI Patient is seen today for followup evaluation of chest pain.  I had seen her on May 18, 2011.  She has been under enormous stress.  She had significant chest discomfort and a CT scan ruled out pulmonary embolus.  After our full discussion I decided to assess her LV function by echo and rule out ischemia by nuclear scan.  The echo reveals an ejection fraction of 60% with no wall motion abnormalities.  Valves reveal no significant abnormalities.  She had a nuclear pharmacologic stress test that showed no significant ischemia.  It had a good discussion with her today.  She is feeling better.  She is getting health concerning her significant stresses.  She seemed to be quite relieved that her heart tests were good. Allergies  Allergen Reactions  . Prednisolone     Prednisone HIVES.    Current Outpatient Prescriptions  Medication Sig Dispense Refill  . DULoxetine (CYMBALTA) 60 MG capsule Take 60 mg by mouth daily.        . fish oil-omega-3 fatty acids 1000 MG capsule Take 2 g by mouth daily.        . Glucosamine-Chondroit-Vit C-Mn (GLUCOSAMINE 1500 COMPLEX PO) Take 1 tablet by mouth daily.        Marland Kitchen levothyroxine (SYNTHROID, LEVOTHROID) 150 MCG tablet Take 150 mcg by mouth daily.        Marland Kitchen lidocaine (LIDODERM) 5 % Place 1 patch onto the skin daily. Remove & Discard patch within 12 hours or as directed by MD       . loratadine (CLARITIN) 10 MG tablet Take 10 mg by mouth daily.        . solifenacin (VESICARE) 5 MG tablet Take 5 mg by mouth daily.       . clonazePAM (KLONOPIN) 0.5 MG tablet Take 0.5 mg by mouth 2 (two) times daily as needed.        . cyclobenzaprine (FLEXERIL) 10 MG tablet Take 10 mg by mouth 3 (three) times daily as needed.        . naproxen (NAPROSYN) 500 MG tablet Take 500 mg by mouth 2 (two) times daily as needed.        . traMADol (ULTRAM) 50 MG tablet Take 50 mg by mouth every 6 (six) hours as needed. Maximum dose= 8 tablets per day         History   Social History  .  Marital Status: Married    Spouse Name: N/A    Number of Children: 0  . Years of Education: N/A   Occupational History  . [employer] [place]   Social History Main Topics  . Smoking status: Never Smoker   . Smokeless tobacco: Not on file  . Alcohol Use: No  . Drug Use: No  . Sexually Active: Yes   Other Topics Concern  . Not on file   Social History Narrative  . No narrative on file    Family History  Problem Relation Age of Onset  . Adopted: Yes  . Stroke    . Depression Mother     Past Medical History  Diagnosis Date  . Chest pain   . Tachycardia   . Anxiety   . Dyslipidemia   . Hypothyroidism   . Depression   . Fibromyalgia   . Dizziness   . Fibromyalgia muscle pain   . Ejection fraction     EF 60%, echo, 05/2011    Past Surgical History  Procedure Date  . Wisdom  tooth extraction   . Carpal tunnel release   . Cholecystectomy   . Gallbladder surgery     ROS  Patient denies fever, chills, headache, sweats, rash, change in vision, change in hearing, chest pain, cough, nausea vomiting, urinary symptoms.  All other systems are reviewed and are negative.  PHYSICAL EXAM   Vital signs are normal.  Her diastolic blood pressure is mildly elevated.  This needs to be followed over time.  Her pressure was not elevated when I saw her last. Filed Vitals:   05/31/11 1456  BP: 138/94  Pulse: 104  Resp: 18  Height: 5\' 5"  (1.651 m)  Weight: 271 lb 12.8 oz (123.288 kg)    ASSESSMENT & PLAN

## 2011-06-01 ENCOUNTER — Other Ambulatory Visit (HOSPITAL_COMMUNITY): Payer: 59

## 2011-06-01 NOTE — Progress Notes (Signed)
    Daily Group Progress Note  Program: IOP  Group Time: 9:00-10:30 am   Participation Level: Active  Behavioral Response: Appropriate  Type of Therapy:  Process Group  Summary of Progress: Pt reports depression and was tearful. Pt identified her tendency to "overcare" and how this gets in the way of her being healthy.      Group Time: 10:30 am - 12:00 pm   Participation Level:  Active  Behavioral Response: Appropriate  Type of Therapy: Psycho-education Group  Summary of Progress:  Patient participated in an educational discussion on defining anxiety and on how to use different coping strategies to manage anxiety.  Maxcine Ham, LCSW

## 2011-06-02 ENCOUNTER — Other Ambulatory Visit (HOSPITAL_BASED_OUTPATIENT_CLINIC_OR_DEPARTMENT_OTHER): Payer: 59

## 2011-06-02 DIAGNOSIS — F331 Major depressive disorder, recurrent, moderate: Secondary | ICD-10-CM

## 2011-06-02 NOTE — Progress Notes (Unsigned)
   [facility]al Health Follow-up Outpatient Visit  [name] [birth year]  Date: 06-02-11   Subjective Pt seen states gets frustrated easily , over little things. tol meds well. States groups are helping her understand her boundries better, Sleep-improved, app-good. No si/hi. No psychosis. There were no vitals filed for this visit.  Mental Status Examination  Appearance: neat Alert: Yes Attention: good  Cooperative: Yes Eye Contact: Good Speech: normal Psychomotor Activity: Normal Memory/Concentration: fair Oriented: person, place, time/date, situation, day of week, month of year and year Mood: Anxious and Irritable Affect: Congruent Thought Processes and Associations: Linear Fund of Knowledge: Fair Thought Content: No si / hi. No psychosis Insight: Good Judgement: Good  Diagnosis:  Major depressive episode. Anxiety disorder NOS  Treatment Plan:  Cont Cymbalta 160 mg q am and other meds. Increase Risperdal 0.5 mg po bid.  Jahanna Raether.Linet Brash.M.D.  [facility]

## 2011-06-02 NOTE — Progress Notes (Signed)
    Daily Group Progress Note  Program: IOP  Group Time:   0900-1030  Participation Level: Active  Behavioral Response: Appropriate, Sharing and Care-Taking  Type of Therapy:  Process Group  Summary of Progress: [name] was here very early, engaged in reading a recommended book about "Boundaries".  She said she normally did not read anything, but this book has caught her interest because it seems to be all about her.  She had the chance to practice respecting boundaries as well today in group.  She became involved in an intellectual discussion of various chronic illnesses and how they have affected her.  The group and leader exerted some limits and she accepted these well.  She was very much engaged in recognizing the ways in which she has the same as well as different issues than peers.     Group Time: 1045-1200  Participation Level:  Active  Behavioral Response: Appropriate, Sharing, Care-Taking and Motivated  Type of Therapy: Psycho-education Group  Summary of Progress: This group focused on the difficulty people sometimes have in saying good-bye or in ending a relationship, in the context of boundaries.  A number of people got in touch with grief related to recent or distant losses that remain unresolved.  Goodbyes were shared with the group facilitator who will be leaving to have her baby.  This served as an excellent example of how to end the therapeutic relationship.  Jessica Chock, APRN, MS    [facility]

## 2011-06-05 ENCOUNTER — Encounter (HOSPITAL_COMMUNITY): Payer: 59

## 2011-06-05 ENCOUNTER — Other Ambulatory Visit (HOSPITAL_BASED_OUTPATIENT_CLINIC_OR_DEPARTMENT_OTHER): Payer: 59

## 2011-06-05 DIAGNOSIS — F331 Major depressive disorder, recurrent, moderate: Secondary | ICD-10-CM

## 2011-06-05 NOTE — Progress Notes (Signed)
    Daily Group Progress Note  Program: IOP  Group Time: 9:00 - 11:30 a.m.  Participation Level: Active  Behavioral Response: Appropriate, Sharing and Motivated  Type of Therapy:  Process Group  Summary of Progress: Patient shared about ways she copes with her depression including knitting and fishing.  She did state and felt "ok," ( not bad or good) but that she was struggling with putting herself first considering she got a call from her sister-in-law who said the SIL's father died.  She reports struggling with going to group but did show.     Group Time: 11:00 - Noon  Participation Level:  Active  Behavioral Response: Appropriate and Sharing  Type of Therapy: Psycho-education Group  Summary of Progress: Patient attended the grief and loss group.  Cleophas Dunker, LMFT, CTS

## 2011-06-05 NOTE — Progress Notes (Signed)
   [facility] Behavioral Health Follow-up Outpatient Visit  [name] [birth year]  Date: 06-05-11   Subjective: doing ok trying to stay strong for my friend who has lost a family member. There were no vitals filed for this visit.  Mental Status Examination  Appearance: goodAlert: Yes Attention: good  Cooperative: Yes Eye Contact: Good Speech: normal Psychomotor Activity: Normal Memory/Concentration: good Oriented: person, place, time/date, situation, day of week, month of year and year Mood: Anxious Affect: Congruent Thought Processes and Associations: Linear Fund of Knowledge: Good Thought Content: No si/hi/hall/del Insight: Good Judgement: Good  Diagnosis: depression  Treatment Plancont meds and therapy [name].[name].M.D. [facility]

## 2011-06-06 ENCOUNTER — Other Ambulatory Visit (HOSPITAL_COMMUNITY): Payer: 59

## 2011-06-06 NOTE — Progress Notes (Signed)
    Daily Group Progress Note  Program: IOP  Group Time: 9:00 - 10:30 a.m.  Participation Level: Active  Behavioral Response: Appropriate, Sharing, Care-Taking and Motivated  Type of Therapy:  Process Group  Summary of Progress: Patient talked about taking care of her husband who has serious medical problems.  She also tried to solve a few of the other patient's problems for them but stopped herself stating, "I know I'm caretaking right now."  Discussed the idea that it takes time to change behavior.   Group Time: 11:00 - Noon  Participation Level:  Active  Behavioral Response: Appropriate, Sharing and Motivated  Type of Therapy: Psycho-education Group  Summary of Progress: Patient participated in the group on holiday tips for people who have depression, anxiety, and who have lost a loved one.  Patient cried throughout the group stating that Thanksgiving is difficult for her since she lost her best friend and her father.    Cleophas Dunker, LMFT, CTS

## 2011-06-07 ENCOUNTER — Other Ambulatory Visit (HOSPITAL_COMMUNITY): Payer: 59

## 2011-06-07 NOTE — Progress Notes (Signed)
    Daily Group Progress Note  Program: IOP  Group Time: 1000-1200  Participation Level: Active  Behavioral Response: Appropriate, Sharing, Care-Taking and Motivated  Type of Therapy:  Process Group  Summary of Progress: Lauria was very active with helping peers.  She also had a solution to her holiday stress and had already put into action getting an estranged family member to the family celebration.  She has been able to have a good and healing conversation with her mother.    Group Time: 0900-1000  Participation Level:  Active  Behavioral Response: Appropriate, Sharing and Motivated  Type of Therapy: Psycho-education Group  Summary of Progress:   Peggye Fothergill, [address] D., did a Q&A session about psychotropic medications which was well received.   Shonna Chock, APRN, CNS    [facility]

## 2011-06-09 ENCOUNTER — Other Ambulatory Visit (HOSPITAL_COMMUNITY): Payer: 59

## 2011-06-12 ENCOUNTER — Other Ambulatory Visit (HOSPITAL_COMMUNITY): Payer: 59

## 2011-06-12 NOTE — Progress Notes (Signed)
    Daily Group Progress Note  Program: IOP  Group Time: 9:00 - 10:30 a.m.  Participation Level: Active  Behavioral Response: Appropriate, Sharing, Care-Taking and Motivated  Type of Therapy:  Process Group  Summary of Progress: Patient talked about her Thanksgiving weekend and how she coped.  She reports it was better than anticipated because she decided to take part of the weekend and go to the beach with a friend, "in order to take care of me."  Group Time: 11:00 - Noon  Participation Level:  Minimal  Behavioral Response: Appropriate and Sharing  Type of Therapy: Psycho-education Group  Summary of Progress: Patient attended the grief and loss group.  Patient talked about the death of her best friend and the best friend's husband.  Cleophas Dunker, LMFT, CTS

## 2011-06-13 ENCOUNTER — Other Ambulatory Visit (HOSPITAL_BASED_OUTPATIENT_CLINIC_OR_DEPARTMENT_OTHER): Payer: 59 | Admitting: Psychiatry

## 2011-06-13 DIAGNOSIS — F331 Major depressive disorder, recurrent, moderate: Secondary | ICD-10-CM

## 2011-06-13 MED ORDER — RISPERIDONE 1 MG PO TABS: 0.5000 mg | ORAL_TABLET | Freq: Two times a day (BID) | ORAL | Status: DC

## 2011-06-13 NOTE — Progress Notes (Signed)
    Daily Group Progress Note  Program: IOP  Group Time: 9:00 - 10:30 a.m.  Participation Level: Active  Behavioral Response: Appropriate, Sharing, Motivated and Sad and crying  Type of Therapy:  Process Group  Summary of Progress: This is patient's last group.  Was involved in a goodbye ceremony.  Patient cried throughout the ceremony stating she did not feel ready to go however the group told her she did a good job saying goodbye.  Discussed options for patient for when she leaves including going to support groups and staying in touch with the group for support.  Group Time: 11:00 - Noon  Participation Level:  Active  Behavioral Response: Appropriate, Sharing and Motivated    Type of Therapy: Psycho-education Group  Summary of Progress: Patient was involved in group on assertive communication and personal rights.  Patient talked about an argument she had with her husband yesterday but was able to identify that what she needed from him was to acknowledge that she was having a difficult time emotionally.  Cleophas Dunker, LMFT, CTS

## 2011-06-13 NOTE — Patient Instructions (Signed)
Patient completed MH-IOP today.  Denies any SI/HI.  Will follow up with Wenda Overland, LPC ([facility]) on 06-15-11 @ 2pm.  Will follow up with PCP for medication management.  Encouraged support groups.
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
## 2011-06-14 ENCOUNTER — Other Ambulatory Visit (HOSPITAL_COMMUNITY): Payer: 59

## 2011-06-15 ENCOUNTER — Other Ambulatory Visit (HOSPITAL_COMMUNITY): Payer: 59

## 2011-06-16 ENCOUNTER — Other Ambulatory Visit (HOSPITAL_COMMUNITY): Payer: 59

## 2011-06-19 ENCOUNTER — Other Ambulatory Visit (HOSPITAL_COMMUNITY): Payer: 59

## 2011-06-20 ENCOUNTER — Other Ambulatory Visit (HOSPITAL_COMMUNITY): Payer: 59

## 2011-08-16 ENCOUNTER — Emergency Department (INDEPENDENT_AMBULATORY_CARE_PROVIDER_SITE_OTHER)
Admission: EM | Admit: 2011-08-16 | Discharge: 2011-08-16 | Disposition: A | Discharge status: Home / Self Care | Payer: 59 | Source: Home / Self Care | Attending: Family Medicine | Admitting: Family Medicine

## 2011-08-16 ENCOUNTER — Encounter (HOSPITAL_COMMUNITY): Payer: Self-pay | Admitting: *Deleted

## 2011-08-16 DIAGNOSIS — M6789 Other specified disorders of synovium and tendon, multiple sites: Secondary | ICD-10-CM

## 2011-08-16 DIAGNOSIS — M79609 Pain in unspecified limb: Secondary | ICD-10-CM

## 2011-08-16 DIAGNOSIS — M79673 Pain in unspecified foot: Secondary | ICD-10-CM

## 2011-08-16 DIAGNOSIS — G5750 Tarsal tunnel syndrome, unspecified lower limb: Secondary | ICD-10-CM

## 2011-08-16 DIAGNOSIS — M76829 Posterior tibial tendinitis, unspecified leg: Secondary | ICD-10-CM

## 2011-08-16 MED ORDER — NAPROXEN 500 MG PO TABS: 500.0000 mg | ORAL_TABLET | Freq: Two times a day (BID) | ORAL | Status: DC

## 2011-08-16 NOTE — ED Notes (Signed)
Pt with onset of foot pain bilateral left worse than right - this episode onset x one week

## 2011-08-16 NOTE — ED Provider Notes (Signed)
History     CSN: [id number]  Arrival date & time 08/16/11  0809   First MD Initiated Contact with Patient 08/16/11 [phone]      Chief Complaint  Patient presents with  . Foot Pain    (Consider location/radiation/quality/duration/timing/severity/associated sxs/prior treatment) HPI Comments: [name] presents for evaluation of bilateral foot pain, that started over the last few months. She reports worsening over the last week to where she has pain at rest, at night, but mostly with walking. She was seen here a month ago and had an xray which showed some arthritis. She now reports diffuse pain in both feet, mostly around the medial ankle and arch, and some in the forefoot. She denies any injury.   Patient is a 46 y.o. female presenting with lower extremity pain. The history is provided by the patient.  Foot Pain This is a new problem. The current episode started more than 1 week ago. The problem occurs constantly. The problem has been gradually worsening. The symptoms are aggravated by walking and standing. Treatments tried: naproxen. The treatment provided mild relief.    Past Medical History  Diagnosis Date  . Chest pain     Nuclear, November, 2012, no ischemia, normal ejection fraction.  . Tachycardia   . Anxiety   . Dyslipidemia   . Hypothyroidism   . Depression   . Fibromyalgia   . Dizziness   . Fibromyalgia muscle pain   . Ejection fraction     EF 60%, echo, 05/2011  . Bipolar 1 disorder     Past Surgical History  Procedure Date  . Wisdom tooth extraction   . Carpal tunnel release   . Cholecystectomy   . Gallbladder surgery     Family History  Problem Relation Age of Onset  . Adopted: Yes  . Stroke    . Depression Mother     History  Substance Use Topics  . Smoking status: Never Smoker   . Smokeless tobacco: Not on file  . Alcohol Use: No    OB History    Grav Para Term Preterm Abortions TAB SAB Ect Mult Living                  Review of Systems    Constitutional: Negative.   HENT: Negative.   Eyes: Negative.   Respiratory: Negative.   Cardiovascular: Negative.   Gastrointestinal: Negative.   Genitourinary: Negative.   Musculoskeletal: Positive for arthralgias and gait problem.       Bilateral foot pain  Skin: Negative.   Neurological: Negative.     Allergies  Prednisolone  Home Medications   Current Outpatient Rx  Name Route Sig Dispense Refill  . ARIPIPRAZOLE 10 MG PO TABS Oral Take 10 mg by mouth daily.    Marland Kitchen CLONAZEPAM 0.5 MG PO TABS Oral Take 0.5 mg by mouth 2 (two) times daily as needed.      . DULOXETINE HCL 60 MG PO CPEP Oral Take 60 mg by mouth daily.      . OMEGA-3 FATTY ACIDS 1000 MG PO CAPS Oral Take 2 g by mouth daily.      Marland Kitchen GLUCOSAMINE 1500 COMPLEX PO Oral Take 1 tablet by mouth daily.      Marland Kitchen LEVOTHYROXINE SODIUM 150 MCG PO TABS Oral Take 150 mcg by mouth daily.      Marland Kitchen LIDOCAINE 5 % EX PTCH Transdermal Place 1 patch onto the skin daily. Remove & Discard patch within 12 hours or as directed by MD     .  LORATADINE 10 MG PO TABS Oral Take 10 mg by mouth daily.      Marland Kitchen RISPERIDONE 1 MG PO TABS Oral Take 0.5 tablets (0.5 mg total) by mouth 2 (two) times daily. 60 tablet 0  . SOLIFENACIN SUCCINATE 5 MG PO TABS Oral Take 5 mg by mouth daily.     . CYCLOBENZAPRINE HCL 10 MG PO TABS Oral Take 10 mg by mouth 3 (three) times daily as needed.      Marland Kitchen NAPROXEN 500 MG PO TABS Oral Take 500 mg by mouth 2 (two) times daily as needed.      Marland Kitchen NAPROXEN 500 MG PO TABS Oral Take 1 tablet (500 mg total) by mouth 2 (two) times daily with a meal. 30 tablet 0  . TRAMADOL HCL 50 MG PO TABS Oral Take 50 mg by mouth every 6 (six) hours as needed. Maximum dose= 8 tablets per day       BP 126/78  Pulse 81  Temp(Src) 98 F (36.7 C) (Oral)  Resp 16  SpO2 99%  LMP 08/16/2011  Physical Exam  Nursing note and vitals reviewed. Constitutional: She is oriented to person, place, and time. She appears well-developed and well-nourished.   HENT:  Head: Normocephalic and atraumatic.  Eyes: EOM are normal.  Neck: Normal range of motion.  Pulmonary/Chest: Effort normal.  Musculoskeletal: Normal range of motion.       Left foot: She exhibits tenderness. She exhibits normal range of motion and normal capillary refill.       Feet:  Neurological: She is alert and oriented to person, place, and time.  Skin: Skin is warm and dry.  Psychiatric: Her behavior is normal.    ED Course  Procedures (including critical care time)  Labs Reviewed - No data to display No results found.   1. Posterior tibial tendon dysfunction   2. Tarsal tunnel syndrome   3. Foot pain       MDM  Referred to [facility]; referral placed with Rosalita Chessman; patient will call as well; advised naproxen (rx given) and medial arch support        Richardo Priest, MD 08/16/11 [phone]

## 2011-08-24 NOTE — ED Notes (Signed)
1/30 Medical follow- up request form received from Dr. Juanetta Gosling for pt. to be seen at the [facility] in 1-2 weeks for bil. Foot pain L > R medial/arch pain, pes planus.   2/7 I called [facility] and she said pt. came in 1/30 and saw Dr. Cyndia Bent and has a f/u appt. 2/13 @ 0915.

## 2011-08-25 ENCOUNTER — Other Ambulatory Visit: Payer: Self-pay | Admitting: Podiatrist

## 2011-08-25 DIAGNOSIS — M25572 Pain in left ankle and joints of left foot: Secondary | ICD-10-CM

## 2011-08-28 ENCOUNTER — Other Ambulatory Visit: Payer: Self-pay | Admitting: Podiatrist

## 2011-08-28 DIAGNOSIS — M25572 Pain in left ankle and joints of left foot: Secondary | ICD-10-CM

## 2011-08-29 ENCOUNTER — Ambulatory Visit (HOSPITAL_COMMUNITY)
Admission: RE | Admit: 2011-08-29 | Discharge: 2011-08-29 | Disposition: A | Discharge status: Home / Self Care | Payer: 59 | Source: Ambulatory Visit | Attending: Podiatrist | Admitting: Podiatrist

## 2011-08-29 DIAGNOSIS — M659 Synovitis and tenosynovitis, unspecified: Secondary | ICD-10-CM | POA: Insufficient documentation

## 2011-08-29 DIAGNOSIS — M65979 Unspecified synovitis and tenosynovitis, unspecified ankle and foot: Secondary | ICD-10-CM | POA: Insufficient documentation

## 2011-08-29 DIAGNOSIS — M19079 Primary osteoarthritis, unspecified ankle and foot: Secondary | ICD-10-CM | POA: Insufficient documentation

## 2011-08-29 DIAGNOSIS — M2140 Flat foot [pes planus] (acquired), unspecified foot: Secondary | ICD-10-CM | POA: Insufficient documentation

## 2011-08-29 DIAGNOSIS — M25572 Pain in left ankle and joints of left foot: Secondary | ICD-10-CM

## 2011-10-15 ENCOUNTER — Encounter (HOSPITAL_COMMUNITY): Payer: Self-pay

## 2011-10-15 ENCOUNTER — Emergency Department (HOSPITAL_COMMUNITY)
Admission: EM | Admit: 2011-10-15 | Discharge: 2011-10-15 | Disposition: A | Discharge status: Home / Self Care | Payer: 59 | Attending: Emergency Medicine | Admitting: Emergency Medicine

## 2011-10-15 ENCOUNTER — Other Ambulatory Visit: Payer: Self-pay

## 2011-10-15 DIAGNOSIS — R531 Weakness: Secondary | ICD-10-CM

## 2011-10-15 DIAGNOSIS — R51 Headache: Secondary | ICD-10-CM | POA: Insufficient documentation

## 2011-10-15 DIAGNOSIS — R5383 Other fatigue: Secondary | ICD-10-CM | POA: Insufficient documentation

## 2011-10-15 DIAGNOSIS — R42 Dizziness and giddiness: Secondary | ICD-10-CM | POA: Insufficient documentation

## 2011-10-15 DIAGNOSIS — R5381 Other malaise: Secondary | ICD-10-CM | POA: Insufficient documentation

## 2011-10-15 LAB — URINALYSIS, ROUTINE W REFLEX MICROSCOPIC
Glucose, UA: NEGATIVE mg/dL
Leukocytes, UA: NEGATIVE
Nitrite: NEGATIVE
Protein, ur: NEGATIVE mg/dL
Urobilinogen, UA: 1 mg/dL (ref 0.0–1.0)

## 2011-10-15 LAB — GLUCOSE, CAPILLARY: Glucose-Capillary: 103 mg/dL — ABNORMAL HIGH (ref 70–99)

## 2011-10-15 LAB — BASIC METABOLIC PANEL
CO2: 28 mEq/L (ref 19–32)
Calcium: 9.1 mg/dL (ref 8.4–10.5)
GFR calc non Af Amer: >90 mL/min (ref >90)
Sodium: 139 mEq/L (ref 135–145)

## 2011-10-15 LAB — DIFFERENTIAL
Basophils Absolute: 0 10*3/uL (ref 0.0–0.1)
Eosinophils Relative: 1 % (ref 0–5)
Lymphocytes Relative: 28 % (ref 12–46)

## 2011-10-15 LAB — POCT I-STAT TROPONIN I: Troponin i, poc: 0 ng/mL (ref 0.00–0.08)

## 2011-10-15 LAB — CBC
MCV: 89.5 fL (ref 78.0–100.0)
Platelets: 206 10*3/uL (ref 150–400)
RDW: 14.2 % (ref 11.5–15.5)
WBC: 6.8 10*3/uL (ref 4.0–10.5)

## 2011-10-15 MED ORDER — METOCLOPRAMIDE HCL 5 MG/ML IJ SOLN
10.0000 mg | Freq: Once | INTRAMUSCULAR | Status: AC
  Administered 2011-10-15: 10 mg via INTRAVENOUS
  Filled 2011-10-15: qty 2

## 2011-10-15 MED ORDER — LORAZEPAM 2 MG/ML IJ SOLN
0.5000 mg | Freq: Once | INTRAMUSCULAR | Status: AC
  Administered 2011-10-15: 0.5 mg via INTRAVENOUS
  Filled 2011-10-15: qty 1

## 2011-10-15 MED ORDER — SODIUM CHLORIDE 0.9 % IV BOLUS (SEPSIS)
1000.0000 mL | Freq: Once | INTRAVENOUS | Status: AC
  Administered 2011-10-15: 1000 mL via INTRAVENOUS

## 2011-10-15 MED ORDER — KETOROLAC TROMETHAMINE 30 MG/ML IJ SOLN
30.0000 mg | Freq: Once | INTRAMUSCULAR | Status: AC
  Administered 2011-10-15: 30 mg via INTRAVENOUS
  Filled 2011-10-15: qty 1

## 2011-10-15 NOTE — ED Notes (Signed)
Woke up with dizziness and shaky feeling. Pt. Was just diagnosed with hypoglycemia.  Pt. Also having a headache and photphobia.   Speech is clear and denies any numbness

## 2011-10-15 NOTE — ED Provider Notes (Signed)
11:10 AM  Date: 10/15/2011  Rate: 80  Rhythm: normal sinus rhythm  QRS Axis: normal  Intervals: normal  ST/T Wave abnormalities: normal  Conduction Disutrbances:none  Narrative Interpretation: Normal EKG.  Old EKG Reviewed: unchanged    Carleene Cooper III, MD 10/15/11 [phone]

## 2011-10-15 NOTE — Discharge Instructions (Signed)
Call your primary care physician's office tomorrow to schedule close followup for further evaluation and management of your recurrent shakiness and weakness however return to emergency department for any changing or worsening symptoms as discussed. Take over-the-counter Tylenol or ibuprofen immediately at onset of headache. Continue to eat 5-6 small meals with whole grain carbs, low fat protein combination.

## 2011-10-15 NOTE — ED Provider Notes (Signed)
History     CSN: [id number]  Arrival date & time 10/15/11  4098   First MD Initiated Contact with Patient 10/15/11 [phone]      Chief Complaint  Patient presents with  . Dizziness    (Consider location/radiation/quality/duration/timing/severity/associated sxs/prior treatment) HPI  Patient presents to emergency department complaining of a multiple month history of intermittent dizziness, "shakiness", and overall weakness. Patient states that she went to her primary care physician at [facility] last week and had "a lot of blood work done" that showed mild hypoglycemia but "everything else looked normal." At that time she states her primary care physician instructed her to eat 5 small meals throughout the day. Patient states that when she woke this morning she felt very shaky , generally weak, and lightheaded. She denies spinning of the room. Patient states she ate a biscuit and went on into work but felt increasingly lightheaded and shaky. She states she began to have gradual onset headache and mild blurred vision as well as photophobia. Patient states she's having ongoing headache and photophobia but that the blurry vision, lightheadedness, and shakiness has resolved. Patient states that thyroid studies were checked last week and that they were "normal." Patient states that 6 months ago she had an "extensive cardiac workup" stating that she had a stress test that was completely normal. Patient states the whole cardiac workup was "normal." Patient denies known history of diabetes but states that primary care physician is continuing to work this up given instance of hypoglycemia. She denies any associated chest pain or shortness of breath. She denies fevers, chills, loss of vision, double vision, spinning of room, loss of coordination, or difficulty ambulating. Patient is taken nothing for headache prior to arrival. She denies any slurred speech, facial droop, extremity  numbness/tingling/weakness.   Past Medical History  Diagnosis Date  . Chest pain     Nuclear, November, 2012, no ischemia, normal ejection fraction.  . Tachycardia   . Anxiety   . Dyslipidemia   . Hypothyroidism   . Depression   . Fibromyalgia   . Dizziness   . Fibromyalgia muscle pain   . Ejection fraction     EF 60%, echo, 05/2011  . Bipolar 1 disorder     Past Surgical History  Procedure Date  . Wisdom tooth extraction   . Carpal tunnel release   . Cholecystectomy   . Gallbladder surgery     Family History  Problem Relation Age of Onset  . Adopted: Yes  . Stroke    . Depression Mother     History  Substance Use Topics  . Smoking status: Never Smoker   . Smokeless tobacco: Not on file  . Alcohol Use: No    OB History    Grav Para Term Preterm Abortions TAB SAB Ect Mult Living                  Review of Systems  All other systems reviewed and are negative.    Allergies  Prednisolone  Home Medications   Current Outpatient Rx  Name Route Sig Dispense Refill  . ARIPIPRAZOLE 10 MG PO TABS Oral Take 10 mg by mouth daily.    Marland Kitchen CLONAZEPAM 1 MG PO TABS Oral Take 0.5 mg by mouth 2 (two) times daily as needed. For panic attacks    . DULOXETINE HCL 60 MG PO CPEP Oral Take 120 mg by mouth daily.     . OMEGA-3 FATTY ACIDS 1000 MG PO CAPS Oral Take  2 g by mouth daily.      Marland Kitchen GLUCOSAMINE 1500 COMPLEX PO Oral Take 1 tablet by mouth daily.      . IBUPROFEN 200 MG PO TABS Oral Take 400 mg by mouth every 6 (six) hours as needed. For pain    . LEVOTHYROXINE SODIUM 150 MCG PO TABS Oral Take 150 mcg by mouth daily.      Marland Kitchen LORATADINE 10 MG PO TABS Oral Take 10 mg by mouth daily.      Marland Kitchen SOLIFENACIN SUCCINATE 5 MG PO TABS Oral Take 5 mg by mouth daily.     . CYCLOBENZAPRINE HCL 10 MG PO TABS Oral Take 10 mg by mouth 3 (three) times daily as needed. For muscle spasms    . LIDOCAINE 5 % EX PTCH Transdermal Place 1 patch onto the skin as needed. Remove & Discard patch within  12 hours or as directed by MD - for lower back pain    . NAPROXEN 500 MG PO TABS Oral Take 500 mg by mouth 2 (two) times daily as needed. For pain    . TRAMADOL HCL 50 MG PO TABS Oral Take 50 mg by mouth every 6 (six) hours as needed. Maximum dose= 8 tablets per day - for pain      BP 101/62  Pulse 77  Temp(Src) 97.9 F (36.6 C) (Oral)  Resp 20  SpO2 97%  LMP 10/09/2011  Physical Exam  Nursing note and vitals reviewed. Constitutional: She is oriented to person, place, and time. She appears well-developed and well-nourished. No distress.       obese  HENT:  Head: Normocephalic and atraumatic.  Eyes: Conjunctivae and EOM are normal. Pupils are equal, round, and reactive to light. Right eye exhibits no nystagmus. Left eye exhibits no nystagmus.  Neck: Normal range of motion. Neck supple. No Brudzinski's sign and no Kernig's sign noted. No thyromegaly present.  Cardiovascular: Normal rate, regular rhythm, normal heart sounds and intact distal pulses.  Exam reveals no gallop and no friction rub.   No murmur heard. Pulmonary/Chest: Effort normal and breath sounds normal. No respiratory distress. She has no wheezes. She has no rales. She exhibits no tenderness.  Abdominal: Soft. Bowel sounds are normal. She exhibits no distension and no mass. There is no tenderness. There is no rebound and no guarding.  Musculoskeletal: Normal range of motion. She exhibits no edema and no tenderness.  Neurological: She is alert and oriented to person, place, and time. No cranial nerve deficit. Coordination normal.  Skin: Skin is warm and dry. No rash noted. She is not diaphoretic. No erythema.  Psychiatric: She has a normal mood and affect.    ED Course  Procedures (including critical care time)  Patient is ambulating about the department without difficulty, asymptomatic. No ataxia  Headache has decreased from 6/10 to 3/10.   Labs Reviewed  GLUCOSE, CAPILLARY - Abnormal; Notable for the following:     Glucose-Capillary 103 (*)    All other components within normal limits  CBC  DIFFERENTIAL  BASIC METABOLIC PANEL  URINALYSIS, ROUTINE W REFLEX MICROSCOPIC  POCT I-STAT TROPONIN I   No results found.   1. Headache   2. Weakness       MDM  Patient is afebrile, nontoxic-appearing, in no acute distress. No meningeal signs, no neuro focal findings, and she is ambulating without any signs of ataxia. Patient's headache has improved. Symptoms of lightheadedness and shakiness have resolved. Patient's blood glucose was normal in ER. Patient has  had a recent cardiac workup with no abnormal findings. She is followed closely by primary care physician is agreeable to ongoing evaluation and management by primary care for recurrent symptoms of weakness and shakiness however we spoke at length about changing or worsening of symptoms that should prompt return to ER. Patient voices her understanding and is agreeable to plan.        Jenness Corner, PA 10/15/11 1200

## 2011-10-16 NOTE — ED Provider Notes (Signed)
Medical screening examination/treatment/procedure(s) were performed by non-physician practitioner and as supervising physician I was immediately available for consultation/collaboration.   Carleene Cooper III, MD 10/16/11 [phone]

## 2011-11-15 ENCOUNTER — Ambulatory Visit (HOSPITAL_BASED_OUTPATIENT_CLINIC_OR_DEPARTMENT_OTHER): Payer: 59 | Attending: Physician Assistant | Admitting: Radiology

## 2011-11-15 VITALS — Ht 63.0 in | Wt 291.0 lb

## 2011-11-15 DIAGNOSIS — G4733 Obstructive sleep apnea (adult) (pediatric): Secondary | ICD-10-CM | POA: Insufficient documentation

## 2011-11-18 DIAGNOSIS — R0609 Other forms of dyspnea: Secondary | ICD-10-CM

## 2011-11-18 DIAGNOSIS — G4733 Obstructive sleep apnea (adult) (pediatric): Secondary | ICD-10-CM

## 2011-11-18 DIAGNOSIS — R0989 Other specified symptoms and signs involving the circulatory and respiratory systems: Secondary | ICD-10-CM

## 2011-11-18 NOTE — Procedures (Signed)
NAMELEOMIA, [name]              ACCOUNT NO.:  [id number]  MEDICAL RECORD NO.:  [id number]          PATIENT TYPE:  OUT  LOCATION:  SLEEP CENTER                 FACILITY:  [facility]  PHYSICIAN:  Falon Huesca D. Maple Hudson, MD, FCCP, FACPDATE OF BIRTH:  January 31, 1967  DATE OF STUDY:  11/15/2011                           NOCTURNAL POLYSOMNOGRAM  REFERRING PHYSICIAN:  CHRISTIAN SCOTT GURLEY  INDICATION FOR STUDY:  Hypersomnia with sleep apnea.  EPWORTH SLEEPINESS SCORE:  11/24.  BMI 51.5, weight 291 pounds, height 63 inches, neck 18 inches.  MEDICATIONS:  Charted and reviewed.  SLEEP ARCHITECTURE:  Split study protocol.  During the diagnostic phase, total sleep time 122 minutes with sleep efficiency 52.9%.  Stage I was 11.9%, stage II 88.1%, stages III and REM were absent.  Sleep latency 75.5 minutes.  Awake after sleep onset 33 minutes.  Arousal index 91. Bedtime medication:  None.  RESPIRATORY DATA:  Split study protocol.  Apnea-hypopnea index (AHI) 102.3 per hour.  A total of 208 events was scored including 75 obstructive apneas, 3 mixed apneas, 130 hypopneas.  Events were associated with non-supine sleep.  CPAP was titrated to 14 CWP, AHI 0 per hour.  She wore a small ResMed Quattro FX full face mask with heated humidifier.  OXYGEN DATA:  Before CPAP, snoring was moderate with oxygen desaturation to a nadir of 79% on room air.  With CPAP titration, snoring was prevented and mean oxygen saturation held 94.4% on room air.  CARDIAC DATA:  Normal sinus rhythm.  MOVEMENT-PARASOMNIA:  A few limb jerks were noted with insignificant effect on sleep.  Bathroom x1.  IMPRESSIONS-RECOMMENDATIONS: 1. Severe obstructive sleep apnea/hypopnea syndrome, AHI 102.3 per     hour with non-supine events, moderate snoring, and oxygen     desaturation to a nadir of 79% on room air. 2. Successful CPAP titration to 14 CWP, AHI 0 per hour.  She wore a     small ResMed Quattro FX full face mask with heated  humidifier.     Snoring was prevented and mean oxygen saturation held 94.4% on room     air.     Tationa Stech D. Maple Hudson, MD, [facility], FACP Diplomate, American Board of Sleep Medicine    CDY/MEDQ  D:  11/18/2011 11:57:34  T:  11/18/2011 16:07:33  Job:  [id number]

## 2011-12-05 ENCOUNTER — Other Ambulatory Visit (HOSPITAL_COMMUNITY): Payer: Self-pay | Admitting: Orthopedic Surgery

## 2011-12-05 DIAGNOSIS — M25561 Pain in right knee: Secondary | ICD-10-CM

## 2011-12-07 ENCOUNTER — Ambulatory Visit (HOSPITAL_COMMUNITY)
Admission: RE | Admit: 2011-12-07 | Discharge: 2011-12-07 | Disposition: A | Discharge status: Home / Self Care | Payer: 59 | Source: Ambulatory Visit | Attending: Orthopedic Surgery | Admitting: Orthopedic Surgery

## 2011-12-07 DIAGNOSIS — M25561 Pain in right knee: Secondary | ICD-10-CM

## 2011-12-07 DIAGNOSIS — M25569 Pain in unspecified knee: Secondary | ICD-10-CM | POA: Insufficient documentation

## 2011-12-25 ENCOUNTER — Ambulatory Visit: Payer: 59 | Attending: Orthopedic Surgery | Admitting: Rehabilitative and Restorative Service Providers"

## 2011-12-25 DIAGNOSIS — M25569 Pain in unspecified knee: Secondary | ICD-10-CM | POA: Insufficient documentation

## 2011-12-25 DIAGNOSIS — IMO0001 Reserved for inherently not codable concepts without codable children: Secondary | ICD-10-CM | POA: Insufficient documentation

## 2011-12-25 DIAGNOSIS — M6281 Muscle weakness (generalized): Secondary | ICD-10-CM | POA: Insufficient documentation

## 2012-01-03 ENCOUNTER — Ambulatory Visit: Payer: 59 | Admitting: Physical Therapy

## 2012-01-08 ENCOUNTER — Ambulatory Visit: Payer: 59 | Admitting: Physical Therapy

## 2012-01-11 ENCOUNTER — Ambulatory Visit: Payer: 59 | Admitting: Physical Therapy

## 2012-01-22 ENCOUNTER — Encounter: Payer: 59 | Admitting: Rehabilitative and Restorative Service Providers"

## 2012-01-24 ENCOUNTER — Encounter: Payer: 59 | Admitting: Rehabilitative and Restorative Service Providers"

## 2012-07-17 DIAGNOSIS — E119 Type 2 diabetes mellitus without complications: Secondary | ICD-10-CM

## 2012-07-17 HISTORY — DX: Type 2 diabetes mellitus without complications: E11.9

## 2012-09-03 ENCOUNTER — Other Ambulatory Visit: Payer: Self-pay | Admitting: Orthopedic Surgery

## 2012-09-05 ENCOUNTER — Encounter (HOSPITAL_COMMUNITY): Payer: Self-pay | Admitting: Pharmacy Technician

## 2012-09-09 NOTE — Patient Instructions (Signed)
[name]  09/09/2012   Your procedure is scheduled on:09/17/12     Report to [facility] at 12 noon  Call this number if you have problems the morning of surgery: [phone]   Remember:   Do not eat food after midnite.  May have clear liquids until 0800am then npo.    Take these medicines the morning of surgery with A SIP OF WATER:    Do not wear jewelry, make-up or nail polish.  Do not wear lotions, powders, or perfumes. You may wear deodorant.  Do not shave 48 hours prior to surgery.   Do not bring valuables to the hospital.  Contacts, dentures or bridgework may not be worn into surgery.  Leave suitcase in the car. After surgery it may be brought to your room.  For patients admitted to the hospital, checkout time is 11:00 AM the day of  discharge.   SEE CHG INSTRUCTION SHEET    Please read over the following fact sheets that you were given: MRSA Information, coughing and deep breathing exercises, leg exercises               Failure to comply with these instructions may result in cancellation of your surgery.                Patient Signature ____________________________              Nurse Signature _____________________________

## 2012-09-10 ENCOUNTER — Encounter (HOSPITAL_COMMUNITY)
Admission: RE | Admit: 2012-09-10 | Discharge: 2012-09-10 | Disposition: A | Discharge status: Home / Self Care | Payer: BC Managed Care – PPO | Source: Ambulatory Visit | Attending: Orthopedic Surgery | Admitting: Orthopedic Surgery

## 2012-09-10 ENCOUNTER — Ambulatory Visit (HOSPITAL_COMMUNITY)
Admission: RE | Admit: 2012-09-10 | Discharge: 2012-09-10 | Disposition: A | Discharge status: Home / Self Care | Payer: BC Managed Care – PPO | Source: Ambulatory Visit | Attending: Orthopedic Surgery | Admitting: Orthopedic Surgery

## 2012-09-10 ENCOUNTER — Encounter (HOSPITAL_COMMUNITY): Payer: Self-pay

## 2012-09-10 DIAGNOSIS — Z01818 Encounter for other preprocedural examination: Secondary | ICD-10-CM | POA: Insufficient documentation

## 2012-09-10 DIAGNOSIS — Z01812 Encounter for preprocedural laboratory examination: Secondary | ICD-10-CM | POA: Insufficient documentation

## 2012-09-10 DIAGNOSIS — I10 Essential (primary) hypertension: Secondary | ICD-10-CM | POA: Insufficient documentation

## 2012-09-10 HISTORY — DX: Essential (primary) hypertension: I10

## 2012-09-10 HISTORY — DX: Unspecified urinary incontinence: R32

## 2012-09-10 HISTORY — DX: Sleep apnea, unspecified: G47.30

## 2012-09-10 HISTORY — DX: Headache: R51

## 2012-09-10 HISTORY — DX: Unspecified osteoarthritis, unspecified site: M19.90

## 2012-09-10 LAB — SURGICAL PCR SCREEN: Staphylococcus aureus: POSITIVE — AB

## 2012-09-10 LAB — URINALYSIS, ROUTINE W REFLEX MICROSCOPIC
Glucose, UA: 250 mg/dL — AB
Hgb urine dipstick: NEGATIVE
Protein, ur: NEGATIVE mg/dL
pH: 6 (ref 5.0–8.0)

## 2012-09-10 LAB — BASIC METABOLIC PANEL
CO2: 25 mEq/L (ref 19–32)
Calcium: 8.9 mg/dL (ref 8.4–10.5)
Chloride: 97 mEq/L (ref 96–112)
Glucose, Bld: 231 mg/dL — ABNORMAL HIGH (ref 70–99)
Sodium: 134 mEq/L — ABNORMAL LOW (ref 135–145)

## 2012-09-10 LAB — CBC
MCV: 90.6 fL (ref 78.0–100.0)
Platelets: 237 10*3/uL (ref 150–400)
RDW: 13.4 % (ref 11.5–15.5)
WBC: 7.5 10*3/uL (ref 4.0–10.5)

## 2012-09-10 LAB — PROTIME-INR: Prothrombin Time: 13.4 seconds (ref 11.6–15.2)

## 2012-09-10 LAB — ABO/RH: ABO/RH(D): O NEG

## 2012-09-10 NOTE — Progress Notes (Signed)
ECHO 05/29/11 EPIC  Stress Test 05/30/11 EPIC  05/31/11 Last office visit with Dr Myrtis Ser [facility]  Sleep Study 11/22/11 EPIC

## 2012-09-10 NOTE — Progress Notes (Signed)
Urinalysis routed to Dr Simonne Come via [facility] fax.

## 2012-09-10 NOTE — Progress Notes (Signed)
Patient came in to preop appointment upset over office visit in regards to a bill.  Patient stated her heart rate and blood pressure would be elevated.  Heart rate was up at 123.  Patient voiced no complaints.  Blood pressure is 154/91.

## 2012-09-17 ENCOUNTER — Inpatient Hospital Stay (HOSPITAL_COMMUNITY): Payer: BC Managed Care – PPO

## 2012-09-17 ENCOUNTER — Encounter (HOSPITAL_COMMUNITY)
Admission: RE | Disposition: A | Discharge status: Home Health | Payer: Self-pay | Source: Ambulatory Visit | Attending: Orthopedic Surgery

## 2012-09-17 ENCOUNTER — Encounter (HOSPITAL_COMMUNITY): Payer: Self-pay | Admitting: Anesthesiology

## 2012-09-17 ENCOUNTER — Inpatient Hospital Stay (HOSPITAL_COMMUNITY): Payer: BC Managed Care – PPO | Admitting: Anesthesiology

## 2012-09-17 ENCOUNTER — Encounter (HOSPITAL_COMMUNITY): Payer: Self-pay | Admitting: *Deleted

## 2012-09-17 ENCOUNTER — Inpatient Hospital Stay (HOSPITAL_COMMUNITY)
Admission: RE | Admit: 2012-09-17 | Discharge: 2012-09-23 | DRG: 209 | Disposition: A | Discharge status: Home Health | Payer: BC Managed Care – PPO | Source: Ambulatory Visit | Attending: Orthopedic Surgery | Admitting: Orthopedic Surgery

## 2012-09-17 DIAGNOSIS — Z6841 Body Mass Index (BMI) 40.0 and over, adult: Secondary | ICD-10-CM

## 2012-09-17 DIAGNOSIS — F3289 Other specified depressive episodes: Secondary | ICD-10-CM | POA: Diagnosis present

## 2012-09-17 DIAGNOSIS — Z96659 Presence of unspecified artificial knee joint: Secondary | ICD-10-CM

## 2012-09-17 DIAGNOSIS — F329 Major depressive disorder, single episode, unspecified: Secondary | ICD-10-CM | POA: Diagnosis present

## 2012-09-17 DIAGNOSIS — M1710 Unilateral primary osteoarthritis, unspecified knee: Principal | ICD-10-CM | POA: Diagnosis present

## 2012-09-17 DIAGNOSIS — F411 Generalized anxiety disorder: Secondary | ICD-10-CM | POA: Diagnosis present

## 2012-09-17 DIAGNOSIS — M21869 Other specified acquired deformities of unspecified lower leg: Secondary | ICD-10-CM | POA: Diagnosis present

## 2012-09-17 DIAGNOSIS — D649 Anemia, unspecified: Secondary | ICD-10-CM | POA: Diagnosis not present

## 2012-09-17 DIAGNOSIS — I10 Essential (primary) hypertension: Secondary | ICD-10-CM | POA: Diagnosis present

## 2012-09-17 HISTORY — PX: TOTAL KNEE ARTHROPLASTY: SHX125

## 2012-09-17 LAB — TYPE AND SCREEN
ABO/RH(D): O NEG
Antibody Screen: NEGATIVE

## 2012-09-17 SURGERY — ARTHROPLASTY, KNEE, TOTAL
Anesthesia: General | Site: Knee | Laterality: Right | Wound class: Clean

## 2012-09-17 MED ORDER — SUCCINYLCHOLINE CHLORIDE 20 MG/ML IJ SOLN
INTRAMUSCULAR | Status: DC | PRN
  Administered 2012-09-17: 200 mg via INTRAVENOUS

## 2012-09-17 MED ORDER — FENTANYL CITRATE 0.05 MG/ML IJ SOLN
INTRAMUSCULAR | Status: DC | PRN
  Administered 2012-09-17 (×7): 50 ug via INTRAVENOUS
  Administered 2012-09-17: 100 ug via INTRAVENOUS

## 2012-09-17 MED ORDER — LIDOCAINE HCL (CARDIAC) 20 MG/ML IV SOLN
INTRAVENOUS | Status: DC | PRN
  Administered 2012-09-17: 80 mg via INTRAVENOUS

## 2012-09-17 MED ORDER — DIPHENHYDRAMINE HCL 50 MG/ML IJ SOLN: 12.5000 mg | Freq: Four times a day (QID) | INTRAMUSCULAR | Status: DC | PRN

## 2012-09-17 MED ORDER — FESOTERODINE FUMARATE ER 4 MG PO TB24
4.0000 mg | ORAL_TABLET | Freq: Every day | ORAL | Status: DC
  Administered 2012-09-18 – 2012-09-23 (×6): 4 mg via ORAL
  Filled 2012-09-17 (×7): qty 1

## 2012-09-17 MED ORDER — ADULT MULTIVITAMIN W/MINERALS CH
1.0000 | ORAL_TABLET | Freq: Every day | ORAL | Status: DC
  Administered 2012-09-17 – 2012-09-23 (×7): 1 via ORAL
  Filled 2012-09-17 (×7): qty 1

## 2012-09-17 MED ORDER — DIPHENHYDRAMINE HCL 12.5 MG/5ML PO ELIX
12.5000 mg | ORAL_SOLUTION | Freq: Four times a day (QID) | ORAL | Status: DC | PRN
  Filled 2012-09-17: qty 5

## 2012-09-17 MED ORDER — NEOSTIGMINE METHYLSULFATE 1 MG/ML IJ SOLN
INTRAMUSCULAR | Status: DC | PRN
  Administered 2012-09-17: 4 mg via INTRAVENOUS

## 2012-09-17 MED ORDER — SODIUM CHLORIDE 0.9 % IJ SOLN
9.0000 mL | INTRAMUSCULAR | Status: DC | PRN
Start: 2012-09-17 — End: 2012-09-17

## 2012-09-17 MED ORDER — ROCURONIUM BROMIDE 100 MG/10ML IV SOLN
INTRAVENOUS | Status: DC | PRN
  Administered 2012-09-17: 50 mg via INTRAVENOUS
  Administered 2012-09-17: 10 mg via INTRAVENOUS

## 2012-09-17 MED ORDER — HYDROMORPHONE HCL PF 1 MG/ML IJ SOLN
1.0000 mg | INTRAMUSCULAR | Status: DC | PRN
  Administered 2012-09-17 – 2012-09-19 (×7): 1 mg via INTRAVENOUS
  Filled 2012-09-17 (×7): qty 1

## 2012-09-17 MED ORDER — ACETAMINOPHEN 10 MG/ML IV SOLN
INTRAVENOUS | Status: DC | PRN
  Administered 2012-09-17: 1000 mg via INTRAVENOUS

## 2012-09-17 MED ORDER — POVIDONE-IODINE 10 % EX SOLN
CUTANEOUS | Status: DC | PRN
  Administered 2012-09-17: 1 via TOPICAL

## 2012-09-17 MED ORDER — ONDANSETRON HCL 4 MG PO TABS: 4.0000 mg | ORAL_TABLET | Freq: Four times a day (QID) | ORAL | Status: DC | PRN

## 2012-09-17 MED ORDER — RIVAROXABAN 10 MG PO TABS
10.0000 mg | ORAL_TABLET | Freq: Every day | ORAL | Status: DC
  Administered 2012-09-18 – 2012-09-23 (×6): 10 mg via ORAL
  Filled 2012-09-17 (×7): qty 1

## 2012-09-17 MED ORDER — LEVOTHYROXINE SODIUM 150 MCG PO TABS
150.0000 ug | ORAL_TABLET | Freq: Every day | ORAL | Status: DC
  Administered 2012-09-18 – 2012-09-23 (×6): 150 ug via ORAL
  Filled 2012-09-17 (×7): qty 1

## 2012-09-17 MED ORDER — ACETAMINOPHEN 325 MG PO TABS: 650.0000 mg | ORAL_TABLET | Freq: Four times a day (QID) | ORAL | Status: DC | PRN

## 2012-09-17 MED ORDER — POVIDONE-IODINE 7.5 % EX SOLN: Freq: Once | CUTANEOUS | Status: DC

## 2012-09-17 MED ORDER — ONDANSETRON HCL 4 MG/2ML IJ SOLN: 4.0000 mg | Freq: Four times a day (QID) | INTRAMUSCULAR | Status: DC | PRN

## 2012-09-17 MED ORDER — MENTHOL 3 MG MT LOZG: 1.0000 | LOZENGE | OROMUCOSAL | Status: DC | PRN

## 2012-09-17 MED ORDER — DIPHENHYDRAMINE HCL 12.5 MG/5ML PO ELIX: 12.5000 mg | ORAL_SOLUTION | Freq: Four times a day (QID) | ORAL | Status: DC | PRN

## 2012-09-17 MED ORDER — MENTHOL 3 MG MT LOZG
1.0000 | LOZENGE | OROMUCOSAL | Status: DC | PRN
  Filled 2012-09-17: qty 9

## 2012-09-17 MED ORDER — FENTANYL CITRATE 0.05 MG/ML IJ SOLN
25.0000 ug | INTRAMUSCULAR | Status: DC | PRN
  Administered 2012-09-17: 25 ug via INTRAVENOUS
  Administered 2012-09-17: 50 ug via INTRAVENOUS
  Administered 2012-09-17: 25 ug via INTRAVENOUS

## 2012-09-17 MED ORDER — CEFAZOLIN SODIUM-DEXTROSE 2-3 GM-% IV SOLR: 2.0000 g | Freq: Four times a day (QID) | INTRAVENOUS | Status: DC

## 2012-09-17 MED ORDER — SODIUM CHLORIDE 0.9 % IJ SOLN: 9.0000 mL | INTRAMUSCULAR | Status: DC | PRN

## 2012-09-17 MED ORDER — METOCLOPRAMIDE HCL 10 MG PO TABS: 5.0000 mg | ORAL_TABLET | Freq: Three times a day (TID) | ORAL | Status: DC | PRN

## 2012-09-17 MED ORDER — HYDROMORPHONE 0.3 MG/ML IV SOLN: INTRAVENOUS | Status: DC

## 2012-09-17 MED ORDER — NALOXONE HCL 0.4 MG/ML IJ SOLN: 0.4000 mg | INTRAMUSCULAR | Status: DC | PRN

## 2012-09-17 MED ORDER — CEFAZOLIN SODIUM-DEXTROSE 2-3 GM-% IV SOLR
2.0000 g | Freq: Four times a day (QID) | INTRAVENOUS | Status: AC
  Administered 2012-09-17 – 2012-09-18 (×2): 2 g via INTRAVENOUS
  Filled 2012-09-17 (×2): qty 50

## 2012-09-17 MED ORDER — SODIUM CHLORIDE 0.9 % IR SOLN
Status: DC | PRN
  Administered 2012-09-17 (×2): 1000 mL

## 2012-09-17 MED ORDER — HYDROMORPHONE HCL PF 1 MG/ML IJ SOLN
INTRAMUSCULAR | Status: DC | PRN
  Administered 2012-09-17 (×4): 0.5 mg via INTRAVENOUS

## 2012-09-17 MED ORDER — BUPIVACAINE-EPINEPHRINE PF 0.25-1:200000 % IJ SOLN
INTRAMUSCULAR | Status: DC | PRN
  Administered 2012-09-17: 30 mL

## 2012-09-17 MED ORDER — RIVAROXABAN 10 MG PO TABS: 10.0000 mg | ORAL_TABLET | Freq: Every day | ORAL | Status: DC

## 2012-09-17 MED ORDER — ONDANSETRON HCL 4 MG/2ML IJ SOLN
INTRAMUSCULAR | Status: DC | PRN
  Administered 2012-09-17: 4 mg via INTRAVENOUS

## 2012-09-17 MED ORDER — HYDROMORPHONE HCL PF 1 MG/ML IJ SOLN
0.2500 mg | INTRAMUSCULAR | Status: DC | PRN
  Administered 2012-09-17 (×4): 0.5 mg via INTRAVENOUS

## 2012-09-17 MED ORDER — PHENOL 1.4 % MT LIQD
1.0000 | OROMUCOSAL | Status: DC | PRN
  Filled 2012-09-17: qty 177

## 2012-09-17 MED ORDER — DEXTROSE 5 % IV SOLN
3.0000 g | INTRAVENOUS | Status: AC
  Administered 2012-09-17: 3 g via INTRAVENOUS
  Filled 2012-09-17: qty 3000

## 2012-09-17 MED ORDER — STERILE WATER FOR IRRIGATION IR SOLN
Status: DC | PRN
  Administered 2012-09-17: 3000 mL

## 2012-09-17 MED ORDER — DULOXETINE HCL 60 MG PO CPEP
90.0000 mg | ORAL_CAPSULE | Freq: Every day | ORAL | Status: DC
  Administered 2012-09-18 – 2012-09-23 (×6): 90 mg via ORAL
  Filled 2012-09-17 (×6): qty 1

## 2012-09-17 MED ORDER — PROMETHAZINE HCL 25 MG/ML IJ SOLN: 6.2500 mg | INTRAMUSCULAR | Status: DC | PRN

## 2012-09-17 MED ORDER — LORATADINE 10 MG PO TABS
10.0000 mg | ORAL_TABLET | Freq: Every day | ORAL | Status: DC
  Administered 2012-09-18 – 2012-09-23 (×6): 10 mg via ORAL
  Filled 2012-09-17 (×6): qty 1

## 2012-09-17 MED ORDER — VITAMIN C 500 MG PO TABS
500.0000 mg | ORAL_TABLET | Freq: Every day | ORAL | Status: DC
  Administered 2012-09-17 – 2012-09-23 (×7): 500 mg via ORAL
  Filled 2012-09-17 (×7): qty 1

## 2012-09-17 MED ORDER — LACTATED RINGERS IV SOLN
INTRAVENOUS | Status: DC | PRN
  Administered 2012-09-17 (×3): via INTRAVENOUS

## 2012-09-17 MED ORDER — GLYCOPYRROLATE 0.2 MG/ML IJ SOLN
INTRAMUSCULAR | Status: DC | PRN
  Administered 2012-09-17: 0.6 mg via INTRAVENOUS

## 2012-09-17 MED ORDER — CYCLOBENZAPRINE HCL 10 MG PO TABS
10.0000 mg | ORAL_TABLET | Freq: Three times a day (TID) | ORAL | Status: DC | PRN
  Administered 2012-09-18 – 2012-09-23 (×13): 10 mg via ORAL
  Filled 2012-09-17 (×15): qty 1

## 2012-09-17 MED ORDER — PROPOFOL 10 MG/ML IV BOLUS
INTRAVENOUS | Status: DC | PRN
  Administered 2012-09-17: 280 mg via INTRAVENOUS

## 2012-09-17 MED ORDER — LISINOPRIL 20 MG PO TABS
20.0000 mg | ORAL_TABLET | Freq: Every day | ORAL | Status: DC
  Administered 2012-09-17 – 2012-09-23 (×7): 20 mg via ORAL
  Filled 2012-09-17 (×7): qty 1

## 2012-09-17 MED ORDER — PHENOL 1.4 % MT LIQD: 1.0000 | OROMUCOSAL | Status: DC | PRN

## 2012-09-17 MED ORDER — ACETAMINOPHEN 650 MG RE SUPP: 650.0000 mg | Freq: Four times a day (QID) | RECTAL | Status: DC | PRN

## 2012-09-17 MED ORDER — ARIPIPRAZOLE 5 MG PO TABS
5.0000 mg | ORAL_TABLET | Freq: Every day | ORAL | Status: DC
  Administered 2012-09-18 – 2012-09-23 (×6): 5 mg via ORAL
  Filled 2012-09-17 (×6): qty 1

## 2012-09-17 MED ORDER — DEXTROSE IN LACTATED RINGERS 5 % IV SOLN: INTRAVENOUS | Status: DC

## 2012-09-17 MED ORDER — DEXTROSE IN LACTATED RINGERS 5 % IV SOLN
INTRAVENOUS | Status: DC
  Administered 2012-09-17 – 2012-09-18 (×2): via INTRAVENOUS
  Administered 2012-09-18: 100 mL/h via INTRAVENOUS

## 2012-09-17 MED ORDER — OXYCODONE HCL 5 MG PO TABS
5.0000 mg | ORAL_TABLET | ORAL | Status: DC | PRN
  Administered 2012-09-18: 5 mg via ORAL
  Administered 2012-09-18: 10 mg via ORAL
  Administered 2012-09-18: 5 mg via ORAL
  Administered 2012-09-19 – 2012-09-22 (×13): 10 mg via ORAL
  Administered 2012-09-22: 5 mg via ORAL
  Administered 2012-09-22 – 2012-09-23 (×5): 10 mg via ORAL
  Filled 2012-09-17 (×13): qty 2
  Filled 2012-09-17 (×2): qty 1
  Filled 2012-09-17 (×6): qty 2
  Filled 2012-09-17: qty 1

## 2012-09-17 MED ORDER — METOCLOPRAMIDE HCL 5 MG/ML IJ SOLN: 5.0000 mg | Freq: Three times a day (TID) | INTRAMUSCULAR | Status: DC | PRN

## 2012-09-17 MED ORDER — MIDAZOLAM HCL 5 MG/5ML IJ SOLN
INTRAMUSCULAR | Status: DC | PRN
  Administered 2012-09-17 (×2): 1 mg via INTRAVENOUS

## 2012-09-17 MED ORDER — FUROSEMIDE 40 MG PO TABS
40.0000 mg | ORAL_TABLET | Freq: Every day | ORAL | Status: DC
  Administered 2012-09-17 – 2012-09-23 (×7): 40 mg via ORAL
  Filled 2012-09-17 (×7): qty 1

## 2012-09-17 MED ORDER — KETOROLAC TROMETHAMINE 30 MG/ML IJ SOLN
15.0000 mg | Freq: Once | INTRAMUSCULAR | Status: AC | PRN
  Administered 2012-09-17: 30 mg via INTRAVENOUS

## 2012-09-17 MED ORDER — 0.9 % SODIUM CHLORIDE (POUR BTL) OPTIME
TOPICAL | Status: DC | PRN
  Administered 2012-09-17: 1000 mL

## 2012-09-17 SURGICAL SUPPLY — 65 items
BAG SPEC THK2 15X12 ZIP CLS (MISCELLANEOUS) ×2
BAG ZIPLOCK 12X15 (MISCELLANEOUS) ×4 IMPLANT
BANDAGE ELASTIC 4 VELCRO ST LF (GAUZE/BANDAGES/DRESSINGS) ×2 IMPLANT
BANDAGE ELASTIC 6 VELCRO ST LF (GAUZE/BANDAGES/DRESSINGS) ×3 IMPLANT
BANDAGE ESMARK 6X9 LF (GAUZE/BANDAGES/DRESSINGS) ×1 IMPLANT
BANDAGE GAUZE ELAST BULKY 4 IN (GAUZE/BANDAGES/DRESSINGS) ×3 IMPLANT
BLADE SAG 18X100X1.27 (BLADE) ×2 IMPLANT
BNDG CMPR 9X6 STRL LF SNTH (GAUZE/BANDAGES/DRESSINGS) ×1
BNDG ESMARK 6X9 LF (GAUZE/BANDAGES/DRESSINGS) ×2
CEMENT BONE 1-PACK (Cement) ×4 IMPLANT
CLOTH BEACON ORANGE TIMEOUT ST (SAFETY) ×2 IMPLANT
CONT SPECI 4OZ STER CLIK (MISCELLANEOUS) ×2 IMPLANT
CUFF TOURN SGL QUICK 34 (TOURNIQUET CUFF)
CUFF TOURN SGL QUICK 44 (TOURNIQUET CUFF) ×1 IMPLANT
CUFF TRNQT CYL 34X4X40X1 (TOURNIQUET CUFF) IMPLANT
DRAPE EXTREMITY T 121X128X90 (DRAPE) ×2 IMPLANT
DRAPE INCISE IOBAN 66X45 STRL (DRAPES) ×1 IMPLANT
DRAPE LG THREE QUARTER DISP (DRAPES) ×2 IMPLANT
DRAPE POUCH INSTRU U-SHP 10X18 (DRAPES) ×2 IMPLANT
DRAPE U-SHAPE 47X51 STRL (DRAPES) ×2 IMPLANT
DRSG ADAPTIC 3X8 NADH LF (GAUZE/BANDAGES/DRESSINGS) ×2 IMPLANT
DRSG PAD ABDOMINAL 8X10 ST (GAUZE/BANDAGES/DRESSINGS) ×4 IMPLANT
ELECT BLADE TIP CTD 4 INCH (ELECTRODE) ×2 IMPLANT
ELECT REM PT RETURN 9FT ADLT (ELECTROSURGICAL) ×2
ELECTRODE REM PT RTRN 9FT ADLT (ELECTROSURGICAL) ×1 IMPLANT
EVACUATOR 1/8 PVC DRAIN (DRAIN) ×2 IMPLANT
FACESHIELD LNG OPTICON STERILE (SAFETY) ×14 IMPLANT
GLOVE ECLIPSE 8.0 STRL XLNG CF (GLOVE) ×4 IMPLANT
GLOVE INDICATOR 8.0 STRL GRN (GLOVE) ×6 IMPLANT
GLOVE SURG SS PI 8.5 STRL IVOR (GLOVE) ×2
GLOVE SURG SS PI 8.5 STRL STRW (GLOVE) IMPLANT
GOWN BRE IMP PREV XXLGXLNG (GOWN DISPOSABLE) ×1 IMPLANT
GOWN STRL REIN XL XLG (GOWN DISPOSABLE) ×4 IMPLANT
HANDPIECE INTERPULSE COAX TIP (DISPOSABLE) ×2
IMMOBILIZER KNEE 20 (SOFTGOODS)
IMMOBILIZER KNEE 20 THIGH 36 (SOFTGOODS) IMPLANT
IMMOBILIZER KNEE 22  40 CIR (ORTHOPEDIC SUPPLIES) ×1
IMMOBILIZER KNEE 22 40 CIR (ORTHOPEDIC SUPPLIES) IMPLANT
KIT BASIN OR (CUSTOM PROCEDURE TRAY) ×2 IMPLANT
MANIFOLD NEPTUNE II (INSTRUMENTS) ×2 IMPLANT
NEEDLE HYPO 22GX1.5 SAFETY (NEEDLE) ×2 IMPLANT
NS IRRIG 1000ML POUR BTL (IV SOLUTION) ×2 IMPLANT
PACK TOTAL JOINT (CUSTOM PROCEDURE TRAY) ×2 IMPLANT
POSITIONER SURGICAL ARM (MISCELLANEOUS) ×2 IMPLANT
SET HNDPC FAN SPRY TIP SCT (DISPOSABLE) ×1 IMPLANT
SPONGE GAUZE 4X4 12PLY (GAUZE/BANDAGES/DRESSINGS) ×2 IMPLANT
SPONGE LAP 18X18 X RAY DECT (DISPOSABLE) ×3 IMPLANT
SPONGE SURGIFOAM ABS GEL 100 (HEMOSTASIS) IMPLANT
STAPLER VISISTAT 35W (STAPLE) ×3 IMPLANT
STEM REGULAR FLUTED (Stem) ×1 IMPLANT
SUCTION FRAZIER 12FR DISP (SUCTIONS) ×2 IMPLANT
SUT BONE WAX W31G (SUTURE) ×2 IMPLANT
SUT VIC AB 0 CT1 27 (SUTURE) ×2
SUT VIC AB 0 CT1 27XBRD ANTBC (SUTURE) IMPLANT
SUT VIC AB 1 CT1 27 (SUTURE) ×12
SUT VIC AB 1 CT1 27XBRD ANTBC (SUTURE) ×6 IMPLANT
SUT VIC AB 2-0 CT1 27 (SUTURE) ×6
SUT VIC AB 2-0 CT1 27XBRD (SUTURE) ×2 IMPLANT
SUT VIC AB 2-0 CT1 TAPERPNT 27 (SUTURE) IMPLANT
SYR 20CC LL (SYRINGE) ×2 IMPLANT
TOWEL OR 17X26 10 PK STRL BLUE (TOWEL DISPOSABLE) ×4 IMPLANT
TOWER CARTRIDGE SMART MIX (DISPOSABLE) ×2 IMPLANT
TRAY FOLEY CATH 14FRSI W/METER (CATHETERS) ×1 IMPLANT
WATER STERILE IRR 1500ML POUR (IV SOLUTION) IMPLANT
WRAP KNEE MAXI GEL POST OP (GAUZE/BANDAGES/DRESSINGS) ×3 IMPLANT

## 2012-09-17 NOTE — Brief Op Note (Signed)
09/17/2012  5:26 PM  PATIENT:  [name]  47 y.o. female  PRE-OPERATIVE DIAGNOSIS:  Osteoarthritis of the right knee  POST-OPERATIVE DIAGNOSIS:  Osteoarthritis of the right knee  PROCEDURE:  Procedure(s): TOTAL KNEE ARTHROPLASTY (Right)  SURGEON:  Surgeon(s) and Role:    * Drucilla Schmidt, MD - Primary  PHYSICIAN ASSISTANT:   ASSISTANTS: Skip Mayer [facility]  ANESTHESIA:   general  EBL:  Total I/O In: 3300 [I.V.:3300] Out: 700 [Urine:100; Blood:600]  BLOOD ADMINISTERED:none  DRAINS: (knee;open) Hemovact drain(s) in the knee-suction open with  Suction Open   LOCAL MEDICATIONS USED:  MARCAINE     SPECIMEN:  No Specimen  DISPOSITION OF SPECIMEN:  N/A  COUNTS:  YES  TOURNIQUET:   Total Tourniquet Time Documented: Thigh (Right) - 45 minutes Total: Thigh (Right) - 45 minutes   DICTATION: .Other Dictation: Dictation Number [id number]  PLAN OF CARE: Admit to inpatient   PATIENT DISPOSITION:  PACU - hemodynamically stable.   Delay start of Pharmacological VTE agent (>24hrs) due to surgical blood loss or risk of bleeding: no

## 2012-09-17 NOTE — Anesthesia Postprocedure Evaluation (Signed)
  Anesthesia Post-op Note  Patient: [name]  Procedure(s) Performed: Procedure(s) (LRB): TOTAL KNEE ARTHROPLASTY (Right)  Patient Location: PACU  Anesthesia Type: General  Level of Consciousness: awake and alert   Airway and Oxygen Therapy: Patient Spontanous Breathing  Post-op Pain: mild  Post-op Assessment: Post-op Vital signs reviewed, Patient's Cardiovascular Status Stable, Respiratory Function Stable, Patent Airway and No signs of Nausea or vomiting  Last Vitals:  Filed Vitals:   09/17/12 1710  BP:   Pulse: 121  Temp: 36.6 C  Resp:     Post-op Vital Signs: stable   Complications: No apparent anesthesia complications

## 2012-09-17 NOTE — Transfer of Care (Signed)
Immediate Anesthesia Transfer of Care Note  Patient: [name]  Procedure(s) Performed: Procedure(s): TOTAL KNEE ARTHROPLASTY (Right)  Patient Location: PACU  Anesthesia Type:General  Level of Consciousness: awake, alert  and oriented  Airway & Oxygen Therapy: Patient Spontanous Breathing and Patient connected to face mask oxygen  Post-op Assessment: Report given to PACU RN and Post -op Vital signs reviewed and stable  Post vital signs: Reviewed and stable  Complications: No apparent anesthesia complications

## 2012-09-17 NOTE — Pre-Procedure Instructions (Signed)
She has HandP in chart by Mr Idolina Primer from 2/25.  She has had no health changes since then and is ok for surgery.

## 2012-09-17 NOTE — Preoperative (Signed)
Beta Blockers   Reason not to administer Beta Blockers:Not Applicable 

## 2012-09-17 NOTE — Anesthesia Preprocedure Evaluation (Addendum)
Anesthesia Evaluation  Patient identified by MRN, date of birth, ID band Patient awake    Reviewed: Allergy & Precautions, H&P , NPO status , Patient's Chart, lab work & pertinent test results  History of Anesthesia Complications (+) MALIGNANT HYPERTHERMIA  Airway Mallampati: III TM Distance: <3 FB Neck ROM: Full    Dental no notable dental hx.    Pulmonary neg pulmonary ROS,  breath sounds clear to auscultation  + decreased breath sounds      Cardiovascular hypertension, Pt. on medications Rhythm:Regular Rate:Normal     Neuro/Psych Anxiety Depression negative neurological ROS     GI/Hepatic negative GI ROS, Neg liver ROS,   Endo/Other  Hypothyroidism Morbid obesity  Renal/GU negative Renal ROS  negative genitourinary   Musculoskeletal negative musculoskeletal ROS (+)   Abdominal   Peds negative pediatric ROS (+)  Hematology negative hematology ROS (+)   Anesthesia Other Findings   Reproductive/Obstetrics negative OB ROS                          Anesthesia Physical Anesthesia Plan  ASA: III  Anesthesia Plan: General   Post-op Pain Management:    Induction: Intravenous  Airway Management Planned: Oral ETT  Additional Equipment:   Intra-op Plan:   Post-operative Plan: Extubation in OR  Informed Consent: I have reviewed the patients History and Physical, chart, labs and discussed the procedure including the risks, benefits and alternatives for the proposed anesthesia with the patient or authorized representative who has indicated his/her understanding and acceptance.   Dental advisory given  Plan Discussed with: CRNA and Surgeon  Anesthesia Plan Comments:         Anesthesia Quick Evaluation

## 2012-09-17 NOTE — Anesthesia Postprocedure Evaluation (Signed)
  Anesthesia Post-op Note  Patient: [name]  Procedure(s) Performed: Procedure(s): TOTAL KNEE ARTHROPLASTY (Right)  Patient Location: PACU  Anesthesia Type:General  Level of Consciousness: awake, alert , oriented and patient cooperative  Airway and Oxygen Therapy: Patient Spontanous Breathing and Patient connected to nasal cannula oxygen  Post-op Pain: moderate, with movement  Post-op Assessment: Post-op Vital signs reviewed, Respiratory Function Stable, Patent Airway, No signs of Nausea or vomiting, Adequate PO intake and Pain level controlled  Post-op Vital Signs: Reviewed and stable  Complications: No apparent anesthesia complications

## 2012-09-18 ENCOUNTER — Encounter (HOSPITAL_COMMUNITY): Payer: Self-pay | Admitting: Orthopedic Surgery

## 2012-09-18 LAB — CBC
HCT: 30.3 % — ABNORMAL LOW (ref 36.0–46.0)
MCH: 30.4 pg (ref 26.0–34.0)
MCV: 90.2 fL (ref 78.0–100.0)
Platelets: 243 10*3/uL (ref 150–400)
RBC: 3.36 MIL/uL — ABNORMAL LOW (ref 3.87–5.11)

## 2012-09-18 NOTE — Progress Notes (Signed)
Physical Therapy Treatment Patient Details Name: [name] MRN: [id number] DOB: June 23, 1966 Today's Date: 09/18/2012 Time: 1478-2956 PT Time Calculation (min): 50 min  PT Assessment / Plan / Recommendation Comments on Treatment Session  Pt progressing very slowly and with increased pain at all times.  Tolerated stand pivot transfer to 3in1 and then bed was brought up behind her.  Discussed D/C options with pt and feel that she would best benefit from SNF then home.  Pt agreed to have CSW speak with them and give them options.  She is concerned about finances.     Follow Up Recommendations  SNF;Supervision/Assistance - 24 hour     Does the patient have the potential to tolerate intense rehabilitation     Barriers to Discharge Decreased caregiver support Requires +2 assist at this time.     Equipment Recommendations  Rolling walker with 5" wheels    Recommendations for Other Services    Frequency 7X/week   Plan Discharge plan remains appropriate    Precautions / Restrictions Precautions Precautions: Fall;Knee Required Braces or Orthoses: Knee Immobilizer - Right Knee Immobilizer - Right: Discontinue once straight leg raise with < 10 degree lag Restrictions Weight Bearing Restrictions: No Other Position/Activity Restrictions: WBAT   Pertinent Vitals/Pain 9/10 pain, premedicated, ice packs applied    Mobility  Bed Mobility Bed Mobility: Sit to Supine Supine to Sit: 1: +2 Total assist;HOB elevated;With rails Supine to Sit: Patient Percentage: 50% Sit to Supine: 1: +2 Total assist Sit to Supine: Patient Percentage: 50% Details for Bed Mobility Assistance: Assist for RLE into bed with guarding assist for trunk and cues for hand placement and adjusting hips once in bed.  Transfers Transfers: Sit to Stand;Stand to Sit;Stand Pivot Transfers Sit to Stand: 1: +2 Total assist;From elevated surface;With upper extremity assist;From chair/3-in-1 Sit to Stand: Patient Percentage:  60% Stand to Sit: 1: +2 Total assist;With upper extremity assist;With armrests;To chair/3-in-1;To bed Stand to Sit: Patient Percentage: 60% Stand Pivot Transfers: 1: +2 Total assist Stand Pivot Transfers: Patient Percentage: 60% Details for Transfer Assistance: Performed x 2 in order to use 3in1 with cues for hand placement and LE management.  Performed stand pivot to 3in1 with cues for sequencing/technique with RW.  Pt with increased difficulty advancing LLE.  Ambulation/Gait Ambulation/Gait Assistance: Not tested (comment) Assistive device: Rolling walker    Exercises Total Joint Exercises Ankle Circles/Pumps: AROM;Both;20 reps Quad Sets: AROM;Right;10 reps Heel Slides: AAROM;Right;10 reps (VERY limited ROM) Hip ABduction/ADduction: AAROM;Right;10 reps Straight Leg Raises: AAROM;Right;10 reps   PT Diagnosis: Difficulty walking;Generalized weakness;Acute pain  PT Problem List: Decreased strength;Decreased range of motion;Decreased activity tolerance;Decreased balance;Decreased mobility;Decreased coordination;Decreased knowledge of use of DME;Decreased safety awareness;Pain;Obesity PT Treatment Interventions: DME instruction;Gait training;Stair training   PT Goals Acute Rehab PT Goals PT Goal Formulation: With patient Time For Goal Achievement: 09/25/12 Potential to Achieve Goals: Good Pt will go Supine/Side to Sit: with min assist PT Goal: Supine/Side to Sit - Progress: Goal set today Pt will go Sit to Supine/Side: with min assist PT Goal: Sit to Supine/Side - Progress: Progressing toward goal Pt will go Sit to Stand: with min assist PT Goal: Sit to Stand - Progress: Progressing toward goal Pt will go Stand to Sit: with min assist PT Goal: Stand to Sit - Progress: Progressing toward goal Pt will Ambulate: 16 - 50 feet;with min assist;with least restrictive assistive device PT Goal: Ambulate - Progress: Progressing toward goal Pt will Go Up / Down Stairs: 1-2 stairs;with mod  assist;with least restrictive  assistive device PT Goal: Up/Down Stairs - Progress: Goal set today  Visit Information  Last PT Received On: 09/18/12 Assistance Needed: +2    Subjective Data  Subjective: I want to go home.  Patient Stated Goal: to return home.    Cognition  Cognition Overall Cognitive Status: Appears within functional limits for tasks assessed/performed Arousal/Alertness: Awake/alert Orientation Level: Appears intact for tasks assessed Behavior During Session: [facility] for tasks performed    Balance     End of Session PT - End of Session Activity Tolerance: Patient limited by pain Patient left: in bed;with call bell/phone within reach;with family/visitor present Nurse Communication: Mobility status   GP     Vista Deck 09/18/2012, 2:48 PM

## 2012-09-18 NOTE — Progress Notes (Signed)
Subjective: 1 Day Post-Op Procedure(s) (LRB): TOTAL KNEE ARTHROPLASTY (Right) Patient reports pain as mild.  Hemovac DC'd (1).  Dressing dry. N/V intact RLE. Foley out when OOB to bathroom.  NaCl lock iv's. Noted Hgb of 10.2, will follow.   Objective: Vital signs in last 24 hours: Temp:  [96.9 F (36.1 C)-98 F (36.7 C)] 97.9 F (36.6 C) (03/05 0502) Pulse Rate:  [102-134] 107 (03/05 0502) Resp:  [12-23] 22 (03/05 0502) BP: (115-208)/(77-152) 115/81 mmHg (03/05 0502) SpO2:  [95 %-99 %] 99 % (03/05 0502) Weight:  [141.976 kg (313 lb)] 141.976 kg (313 lb) (03/04 2342)  Intake/Output from previous day: 03/04 0701 - 03/05 0700 In: 4830 [P.O.:620; I.V.:4210] Out: 1910 [Urine:1125; Drains:185; Blood:600] Intake/Output this shift:     Recent Labs  09/18/12 0403  HGB 10.2*    Recent Labs  09/18/12 0403  WBC 9.6  RBC 3.36*  HCT 30.3*  PLT 243   No results found for this basename: NA, K, CL, CO2, BUN, CREATININE, GLUCOSE, CALCIUM,  in the last 72 hours No results found for this basename: LABPT, INR,  in the last 72 hours  Neurologically intact  Assessment/Plan: 1 Day Post-Op Procedure(s) (LRB): TOTAL KNEE ARTHROPLASTY (Right) Up with therapy  UNDERWOOD III,DOOLEY L 09/18/2012, 7:44 AM

## 2012-09-18 NOTE — Evaluation (Signed)
Physical Therapy Evaluation Patient Details Name: [name] MRN: [id number] DOB: 1965-10-24 Today's Date: 09/18/2012 Time: 4098-1191 PT Time Calculation (min): 29 min  PT Assessment / Plan / Recommendation Clinical Impression  Pt presents s/p R TKA POD 1 with decreased strength, ROM and mobility.  Tolerated OOB to chair, however requires +2 assist for safety and with increased pain with any mobility.  Pt will benefit from skilled PT in acute venue to address deficits.  PT recommends ST SNF for follow up at D/C to maximize pts safety and function.     PT Assessment  Patient needs continued PT services    Follow Up Recommendations  SNF;Supervision/Assistance - 24 hour    Does the patient have the potential to tolerate intense rehabilitation      Barriers to Discharge Decreased caregiver support Requires +2 assist at this time.     Equipment Recommendations  Rolling walker with 5" wheels (Bari RW)    Recommendations for Other Services     Frequency 7X/week    Precautions / Restrictions Precautions Precautions: Fall;Knee Required Braces or Orthoses: Knee Immobilizer - Right Knee Immobilizer - Right: Discontinue once straight leg raise with < 10 degree lag Restrictions Weight Bearing Restrictions: No Other Position/Activity Restrictions: WBAT   Pertinent Vitals/Pain 9/10, RN aware and had been premedicated.       Mobility  Bed Mobility Bed Mobility: Supine to Sit Supine to Sit: 1: +2 Total assist;HOB elevated;With rails Supine to Sit: Patient Percentage: 50% Details for Bed Mobility Assistance: Assist for RLE out of bed with max cues for encouragement and technique to self assist.  Transfers Transfers: Sit to Stand;Stand to Sit;Stand Pivot Transfers Sit to Stand: 1: +2 Total assist;From elevated surface;With upper extremity assist;From bed Sit to Stand: Patient Percentage: 60% Stand to Sit: 1: +2 Total assist;With upper extremity assist;With armrests;To  chair/3-in-1 Stand to Sit: Patient Percentage: 60% Stand Pivot Transfers: 1: +2 Total assist Stand Pivot Transfers: Patient Percentage: 60% Details for Transfer Assistance: Max cues for hand placement, sequencing/technique with RW, and safety.  Pt with increased pain and requires max cues for continuing with transfer.   Ambulation/Gait Ambulation/Gait Assistance: Not tested (comment) Assistive device: Rolling walker    Exercises     PT Diagnosis: Difficulty walking;Generalized weakness;Acute pain  PT Problem List: Decreased strength;Decreased range of motion;Decreased activity tolerance;Decreased balance;Decreased mobility;Decreased coordination;Decreased knowledge of use of DME;Decreased safety awareness;Pain;Obesity PT Treatment Interventions: DME instruction;Gait training;Stair training   PT Goals Acute Rehab PT Goals PT Goal Formulation: With patient Time For Goal Achievement: 09/25/12 Potential to Achieve Goals: Good Pt will go Supine/Side to Sit: with min assist PT Goal: Supine/Side to Sit - Progress: Goal set today Pt will go Sit to Supine/Side: with min assist PT Goal: Sit to Supine/Side - Progress: Goal set today Pt will go Sit to Stand: with min assist PT Goal: Sit to Stand - Progress: Goal set today Pt will go Stand to Sit: with min assist PT Goal: Stand to Sit - Progress: Goal set today Pt will Ambulate: 16 - 50 feet;with min assist;with least restrictive assistive device PT Goal: Ambulate - Progress: Goal set today Pt will Go Up / Down Stairs: 1-2 stairs;with mod assist;with least restrictive assistive device PT Goal: Up/Down Stairs - Progress: Goal set today  Visit Information  Last PT Received On: 09/18/12 Assistance Needed: +2    Subjective Data  Subjective: I'm not ready.  Patient Stated Goal: to return home.    Prior Functioning  Home Living Lives  With: Spouse Available Help at Discharge: Family Type of Home: House Home Access: Stairs to enter [employer] of Steps: 1 Entrance Stairs-Rails: None Home Layout: Two level;Able to live on main level with bedroom/bathroom Bathroom Shower/Tub: Heritage manager Toilet: Handicapped height Home Adaptive Equipment: Environmental consultant - four wheeled;Bedside commode/3-in-1 Prior Function Level of Independence: Independent;Needs assistance (needs A with LB dressingt) Vocation: Works at home Communication Communication: No difficulties    Copywriter, advertising Overall Cognitive Status: Appears within functional limits for tasks assessed/performed Arousal/Alertness: Awake/alert Orientation Level: Appears intact for tasks assessed Behavior During Session: [facility] for tasks performed    Extremity/Trunk Assessment Right Lower Extremity Assessment RLE ROM/Strength/Tone: Deficits RLE ROM/Strength/Tone Deficits: able to perform ankle DF/PF, however unable to perform SLR without total assist and also unable to bend knee due to pain.  Left Lower Extremity Assessment LLE ROM/Strength/Tone: WFL for tasks assessed Trunk Assessment Trunk Assessment: Kyphotic   Balance    End of Session PT - End of Session Activity Tolerance: Patient limited by pain Patient left: in chair;with call bell/phone within reach;with family/visitor present Nurse Communication: Mobility status CPM Right Knee Additional Comments:  (6 to 8 hours/day)  GP     Parcell, Meribeth Mattes 09/18/2012, 11:22 AM

## 2012-09-18 NOTE — Progress Notes (Signed)
CSW met with pt to assist with d/c planning needs. Pt is declining ST SNF placement at this time. Pt states she has 24/7 assistance at home. She is interested in [facility] at d/c.Marland Kitchen CSW is available to assist if pt changes her mind and would like to consider ST SNF placement.  Cori Razor LCSW [phone]

## 2012-09-18 NOTE — Progress Notes (Signed)
Started pt on CPAP at her home setting of 4 cmH2O. Pt is tolerating well. Will continue to monitor.

## 2012-09-18 NOTE — Op Note (Signed)
[name], [name]              ACCOUNT NO.:  [id number]  MEDICAL RECORD NO.:  [id number]  LOCATION:  1604                         FACILITY:  [facility]  PHYSICIAN:  [name], M.D.  DATE OF BIRTH:  September 21, 1965  DATE OF PROCEDURE:  09/17/2012 DATE OF DISCHARGE:                              OPERATIVE REPORT   PREOPERATIVE DIAGNOSIS:  End-stage osteoarthritis, right knee.  POSTOPERATIVE DIAGNOSIS:  End-stage osteoarthritis, right knee.  OPERATION:  Osteonics total knee replacement, right.  SURGEON:  Jessica Kays, MD.  ASSISTANTS:  Jessica Mayer, PA -C.  ANESTHESIA:  General.  INDICATIONS FOR PROCEDURE:  Painful knee which had failed nonsurgical treatment.  She had a bone on bone medially with medial subluxation of the femur on the tibia.  For this reason, with varus deformity and the fact that she was heavy, I elected to use a tibial extension to the tibial component.  DESCRIPTION OF PROCEDURE:  Satisfactory general anesthesia and prophylactic antibiotics, Foley catheter inserted.  Pneumatic tourniquet applied to right lower extremity and also pressure foot and lateral hip stabilizer.  Right leg was then prepped with DuraPrep from tourniquet to ankle and draped in sterile field.  Ioban employed.  Right leg was Esmarched out sterilely and the tourniquet inflated to 350 mmHg.  Time- out was performed.  Vertical midline incision down to the patellar mechanism with median parapatellar incision to open the joint.  Remnants of the ACL and portions of the anterior menisci were removed. Osteophytes removed from around the femur and the tibia and the femur with the pace anserinus and medial collateral ligament undermined off the proximal tibia.  The knee was then flexed and I placed a 5/16-inch drill hole in the distal femur followed by the axis liner for a 5-degree valgus cut for the right knee.  She did not have a flexion contracture, so I went with a standard 10 mm distal  femur resection.  I then used the sizing jig at a size 7.  Fixation holes and the distal femur were placed and I then placed the jig to make the distal femoral cuts with the anterior posterior cuts and posterior and anterior chamfering.  I then went to the tibia, made a leveling cut and we sized with a 5 tray and then made my initial intramedullary drill hole followed by canal finder and intramedullary rod.  I then used the 90-degree cutting jig to take 2 mm cut off the depressed lateral tibial plateau, but this was clearly not enough bone resections, so I took another additional 2 for total of 4 mm resection.  I then went back to the femur and placed a laminar spreader and removed residual bone and soft tissue from behind the femoral condyles.  I then placed the jig for creating the patellar groove and the notchplasty.  These were done.  I then went through a trial reduction and found that the knee was a little tight with a 10 mm spacer and accordingly, I took an additional 2 mm off the proximal tibia and the 10 mm spacer would trial fit nicely without undue tension and was stable.  Minimal lateral release was performed.  I then used the external rod  splitting the bimalleolar distance and put scribe lines on the anterior tibia.  Following this, I went ahead and everted the patella, and we used the 10-mm recess cutting jig to make a 10 mm recess cut.  Fixation holes were placed with a trial.  A trial of 26 patella was then placed and excess bone around the perimeter removed.  We then returned to the tibia where I used the external guide and reamed the tibia up to a 5 and cemented.  I then followed this with hand reaming up to 13 with a little enlargement of the proximal portion of this made and we then went ahead and went through a trial reduction and the tibial component fit nicely.  It should be noted that after about 44 minutes of tourniquet time, the tourniquet malfunctioned, so from  that point on, we had to complete the case without a tourniquet.  Prior to closure, I infiltrated all soft tissues with Marcaine with adrenaline.  We then methylmethacrylated in the components starting first with the tibia, impacting it going next to the femur and impacting it and again removing excess methylmethacrylate, and with the size 5, 10 mm spacer in place, I went ahead and glued in the patella of knee in extension.  We then returned and removed excess small particles of methylmethacrylate and after irrigating the wound well, I placed the final #5, 10 mm spacer. The knee had excellent motion and stability.  The wound was then closed over Hemovac with interrupted #1 Vicryl in the quadriceps tendon distally in the synovium and capsule.  A combination of #1, 0 and 2-0 Vicryl to subcutaneous tissue and staples in the skin, Betadine, Adaptic, dry sterile dressing were applied.  She tolerated the procedure well and was taken to the recovery room in satisfactory vision with no known complications with about 650 mL of blood loss.          ______________________________ [name], M.D.     JA/MEDQ  D:  09/17/2012  T:  09/18/2012  Job:  [id number]

## 2012-09-18 NOTE — Progress Notes (Signed)
Patient states that she does not require any assistance with her CPAP tonight. She has her home mask. RT checked water near fill line. RT encouraged patient to call if she required any assistance throughout the night.

## 2012-09-18 NOTE — Evaluation (Signed)
Occupational Therapy Evaluation Patient Details Name: [name] MRN: [id number] DOB: [birth year] Today's Date: 09/18/2012 Time: 8295-6213 OT Time Calculation (min): 28 min  OT Assessment / Plan / Recommendation Clinical Impression  Pt presents s/p R TKA POD 1 with decreased I with all ADL activity and  mobility.  Tolerated OOB to chair, however requires +2 assist for safety and with increased pain with any mobility. Will benefit from further OT to increase I with ADL activity and return to PLOF    OT Assessment  Patient needs continued OT Services    Follow Up Recommendations  Home health OT;SNF;Other (comment) (depending on progress)       Equipment Recommendations  3 in 1 bedside comode       Frequency  Min 3X/week    Precautions / Restrictions Precautions Precautions: Fall;Knee Required Braces or Orthoses: Knee Immobilizer - Right Knee Immobilizer - Right: Discontinue once straight leg raise with < 10 degree lag Restrictions Weight Bearing Restrictions: No Other Position/Activity Restrictions: WBAT       ADL  Transfers/Ambulation Related to ADLs: Limited eval performed due to pain. Will further evaluate ADL activity next OT visit    OT Diagnosis: Generalized weakness;Acute pain  OT Problem List: Decreased strength;Decreased activity tolerance;Decreased knowledge of use of DME or AE OT Treatment Interventions: Self-care/ADL training;Patient/family education   OT Goals Acute Rehab OT Goals OT Goal Formulation: With patient Time For Goal Achievement: 10/02/12 Potential to Achieve Goals: Good ADL Goals Pt Will Perform Grooming: with set-up;Standing at sink ADL Goal: Grooming - Progress: Goal set today Pt Will Perform Lower Body Dressing: with supervision;Sit to stand from chair;with adaptive equipment ADL Goal: Lower Body Dressing - Progress: Goal set today Pt Will Transfer to Toilet: with supervision;Comfort height toilet ADL Goal: Toilet Transfer - Progress:  Goal set today Pt Will Perform Toileting - Clothing Manipulation: with supervision;Standing ADL Goal: Toileting - Clothing Manipulation - Progress: Goal set today  Visit Information  Last OT Received On: 09/18/12 Assistance Needed: +2       Prior Functioning     Home Living Lives With: Spouse Available Help at Discharge: Family Type of Home: House Home Access: Stairs to enter Secretary/administrator of Steps: 1 Entrance Stairs-Rails: None Home Layout: Two level;Able to live on main level with bedroom/bathroom Bathroom Shower/Tub: Heritage manager Toilet: Handicapped height Home Adaptive Equipment: Environmental consultant - four wheeled;Bedside commode/3-in-1 Prior Function Level of Independence: Independent;Needs assistance (needs A with LB dressingt) Vocation: Works at home Communication Communication: No difficulties         Vision/Perception Vision - History Patient Visual Report: No change from baseline   Cognition  Cognition Overall Cognitive Status: Appears within functional limits for tasks assessed/performed Arousal/Alertness: Awake/alert Orientation Level: Appears intact for tasks assessed Behavior During Session: [facility] for tasks performed    Extremity/Trunk Assessment Right Upper Extremity Assessment RUE ROM/Strength/Tone: Within functional levels Left Upper Extremity Assessment LUE ROM/Strength/Tone: Within functional levels Right Lower Extremity Assessment RLE ROM/Strength/Tone: Deficits RLE ROM/Strength/Tone Deficits: able to perform ankle DF/PF, however unable to perform SLR without total assist and also unable to bend knee due to pain.  Left Lower Extremity Assessment LLE ROM/Strength/Tone: [facility] for tasks assessed Trunk Assessment Trunk Assessment: Kyphotic     Mobility Bed Mobility Bed Mobility: Supine to Sit Supine to Sit: 1: +2 Total assist;HOB elevated;With rails Supine to Sit: Patient Percentage: 50% Details for Bed Mobility Assistance: Assist for  RLE out of bed with max cues for encouragement and technique to self assist.  Transfers Sit to Stand: 1: +2 Total assist;From elevated surface;With upper extremity assist;From bed Sit to Stand: Patient Percentage: 60% Stand to Sit: 1: +2 Total assist;With upper extremity assist;With armrests;To chair/3-in-1 Stand to Sit: Patient Percentage: 60% Details for Transfer Assistance: Max cues for hand placement, sequencing/technique with RW, and safety.  Pt with increased pain and requires max cues for continuing with transfer.             End of Session Pt left in chair with husband present with call bell and phone within reach.  GO     Alba Cory 09/18/2012, 11:38 AM

## 2012-09-19 LAB — CBC
HCT: 26 % — ABNORMAL LOW (ref 36.0–46.0)
MCH: 30.5 pg (ref 26.0–34.0)
MCV: 89 fL (ref 78.0–100.0)
Platelets: 204 10*3/uL (ref 150–400)
RBC: 2.92 MIL/uL — ABNORMAL LOW (ref 3.87–5.11)
RDW: 14.1 % (ref 11.5–15.5)
WBC: 9.4 10*3/uL (ref 4.0–10.5)

## 2012-09-19 NOTE — Progress Notes (Signed)
CSW contacted [facility] to check status of insurance authorization. SNF has spoken with [employer] but no decision has been made at this time. CSW will contact SNF in am to check on progress. Pt has been updated. CSW will continue to follow to assist with d/c planning to SNF.  Cori Razor LCSW [phone]

## 2012-09-19 NOTE — Progress Notes (Signed)
Clinical Social Work Department CLINICAL SOCIAL WORK PLACEMENT NOTE 09/19/2012  Patient:  [name], [name]  Account Number:  [id number] Admit date:  09/17/2012  Clinical Social Worker:  Cori Razor, LCSW  Date/time:  09/19/2012 01:34 PM  Clinical Social Work is seeking post-discharge placement for this patient at the following level of care:   SKILLED NURSING   (*CSW will update this form in Epic as items are completed)   09/19/2012  Patient/family provided with [facility] Department of Clinical Social Work's list of facilities offering this level of care within the geographic area requested by the patient (or if unable, by the patient's family).  09/19/2012  Patient/family informed of their freedom to choose among providers that offer the needed level of care, that participate in Medicare, Medicaid or managed care program needed by the patient, have an available bed and are willing to accept the patient.    Patient/family informed of [facility]' ownership interest in [facility], as well as of the fact that they are under no obligation to receive care at this facility.  PASARR submitted to EDS on 09/19/2012 PASARR number received from [employer] on   FL2 transmitted to all facilities in geographic area requested by pt/family on  09/19/2012 FL2 transmitted to all facilities within larger geographic area on   Patient informed that his/her managed care company has contracts with or will negotiate with  certain facilities, including the following:     Patient/family informed of bed offers received:  09/19/2012 Patient chooses bed at  Physician recommends and patient chooses bed at  [facility]  Patient to be transferred to [facility] on   Patient to be transferred to facility by   The following physician request were entered in Epic:   Additional Comments: Bed offer is pending [facility] authorization  Cori Razor LCSW [phone]

## 2012-09-19 NOTE — Progress Notes (Signed)
Physical Therapy Treatment Patient Details Name: [name] MRN: [id number] DOB: Jun 22, 1966 Today's Date: 09/19/2012 Time: 4098-1191 PT Time Calculation (min): 35 min  PT Assessment / Plan / Recommendation Comments on Treatment Session  Pt.'s HR up to 149 and sustained >125 for . RN aware. BP 113/74  sats 90-95% on RA Pt. is improving in bed mobility, continues with decr. weight tolerated on R leg. Rec. post acute rehab. Pt. is putting forth efforts, feel shorter RW and better fitting KI will facilitate pt. to stand and take steps.Pt's     Follow Up Recommendations  SNF;Supervision/Assistance - 24 hour     Does the patient have the potential to tolerate intense rehabilitation     Barriers to Discharge        Equipment Recommendations  Rolling walker with 5" wheels (bariatric/wide.)    Recommendations for Other Services    Frequency 7X/week   Plan Discharge plan remains appropriate    Precautions / Restrictions Precautions Precautions: Fall;Knee Required Braces or Orthoses: Knee Immobilizer - Right Knee Immobilizer - Right: Discontinue once straight leg raise with < 10 degree lag   Pertinent Vitals/Pain 0 at rest--7 with weight. premedicated    Mobility  Bed Mobility Supine to Sit: 4: Min guard;HOB elevated;With rails Details for Bed Mobility Assistance: pt was able to use rails and HOB elevated to get to sitting on EOB.  Transfers Sit to Stand: 1: +2 Total assist;With upper extremity assist;From bed Sit to Stand: Patient Percentage: 60% Stand to Sit: To chair/3-in-1;With armrests;To elevated surface Stand to Sit: Patient Percentage: 60% Stand Pivot Transfers: 1: +2 Total assist Stand Pivot Transfers: Patient Percentage: 60% Details for Transfer Assistance: Attempted to take steps forward but unable to tolerate weight on R.leg. KI is ill fitting and ?RW is too high. will make adjustments for both for next session. Pt. scooted on L foot , able to advance  Rleg  to turn to recliner.    Exercises     PT Diagnosis:    PT Problem List:   PT Treatment Interventions:     PT Goals Acute Rehab PT Goals Pt will go Supine/Side to Sit: with supervision;with HOB 0 degrees PT Goal: Supine/Side to Sit - Progress: Updated due to goal met Pt will go Sit to Stand: with min assist PT Goal: Sit to Stand - Progress: Progressing toward goal Pt will go Stand to Sit: with min assist PT Goal: Stand to Sit - Progress: Progressing toward goal Pt will Ambulate: 16 - 50 feet;with min assist;with least restrictive assistive device PT Goal: Ambulate - Progress: Progressing toward goal Pt will Perform Home Exercise Program: with min assist PT Goal: Perform Home Exercise Program - Progress: Goal set today  Visit Information  Last PT Received On: 09/19/12 Assistance Needed: +2    Subjective Data  Subjective: I just can't put weight on my leg   Cognition  Cognition Overall Cognitive Status: Appears within functional limits for tasks assessed/performed Behavior During Session: Anxious Cognition - Other Comments: calmed after sititng in chair.    Balance     End of Session PT - End of Session Activity Tolerance: Patient tolerated treatment well;Patient limited by fatigue Patient left: in chair;with call bell/phone within reach Nurse Communication: Mobility status   GP     Rada Hay 09/19/2012, 9:18 AM [phone]

## 2012-09-19 NOTE — Progress Notes (Signed)
Occupational Therapy Treatment Patient Details Name: [name] MRN: [id number] DOB: [birth year] Today's Date: 09/19/2012 Time: 1324-4010 OT Time Calculation (min): 28 min  OT Assessment / Plan / Recommendation    Follow Up Recommendations  SNF                   Precautions / Restrictions Precautions Precautions: Fall;Knee Required Braces or Orthoses: Knee Immobilizer - Right Knee Immobilizer - Right: Discontinue once straight leg raise with < 10 degree lag       ADL  Grooming: Performed;Wash/dry face;Set up Where Assessed - Grooming: Unsupported sitting Upper Body Dressing: Simulated;Set up Where Assessed - Upper Body Dressing: Unsupported sitting Lower Body Dressing: Performed;+2 Total assistance Lower Body Dressing: Patient Percentage: 60% Where Assessed - Lower Body Dressing: Supported sit to stand Toilet Transfer: Simulated;+2 Total assistance Toilet Transfer: Patient Percentage: 60% Toilet Transfer Method: Sit to stand (bed to chair) Toileting - Clothing Manipulation and Hygiene: Maximal assistance Where Assessed - Glass blower/designer Manipulation and Hygiene: Standing Transfers/Ambulation Related to ADLs: Pt improved this OT visit- but still limited due to pain when pt puts weight through operative leg      OT Goals ADL Goals ADL Goal: Lower Body Dressing - Progress: Progressing toward goals ADL Goal: Toilet Transfer - Progress: Progressing toward goals ADL Goal: Toileting - Clothing Manipulation - Progress: Progressing toward goals  Visit Information  Last OT Received On: 09/19/12 Assistance Needed: +2    Subjective Data  Subjective: I did sleep better last night      Cognition  Cognition Overall Cognitive Status: Appears within functional limits for tasks assessed/performed Behavior During Session: Anxious Cognition - Other Comments: calmed after sititng in chair.    Mobility  Bed Mobility Supine to Sit: 4: Min guard;HOB elevated;With  rails Details for Bed Mobility Assistance: pt was able to use rails and HOB elevated to get to sitting on EOB.  Transfers Sit to Stand: 1: +2 Total assist;With upper extremity assist;From bed Sit to Stand: Patient Percentage: 60% Stand to Sit: To chair/3-in-1;With armrests;To elevated surface Stand to Sit: Patient Percentage: 60% Details for Transfer Assistance: Attempted to take steps forward but unable to tolerate weight on R.leg. KI is ill fitting and ?RW is too high. will make adjustments for both for next session. Pt. scooted on L foot , able to advance leg to turn to recliner.          End of Session OT - End of Session Activity Tolerance: Patient limited by pain Pt left in chair with call bell and phone with in reach   GO     [name], Metro Kung 09/19/2012, 9:45 AM

## 2012-09-19 NOTE — Progress Notes (Signed)
Physical Therapy Treatment Patient Details Name: [name] MRN: [id number] DOB: 15-May-1966 Today's Date: 09/19/2012 Time: 1221-1249 PT Time Calculation (min): 28 min  PT Assessment / Plan / Recommendation Comments on Treatment Session  Pt. slowly improving. m,anaging RLEnwith less assistance. Pt. plans for SNF tomorrow.  Continues with high HR    Follow Up Recommendations  SNF;Supervision/Assistance - 24 hour     Does the patient have the potential to tolerate intense rehabilitation     Barriers to Discharge        Equipment Recommendations  Rolling walker with 5" wheels    Recommendations for Other Services    Frequency 7X/week   Plan Discharge plan remains appropriate    Precautions / Restrictions Precautions Precautions: Fall;Knee Restrictions Weight Bearing Restrictions: No   Pertinent Vitals/Pain States no pain unless weight bearing.  HR 131 after pivot.    Mobility  Bed Mobility Sit to Supine: 3: Mod assist Details for Bed Mobility Assistance: support for RLE onto bed. Transfers Sit to Stand: 1: +2 Total assist;With upper extremity assist;From bed Sit to Stand: Patient Percentage: 70% Stand to Sit: To chair/3-in-1;With armrests;To elevated surface Stand to Sit: Patient Percentage: 70% Stand Pivot Transfers: 1: +2 Total assist Stand Pivot Transfers: Patient Percentage: 70% Details for Transfer Assistance: Pt. is taking more weight on RLE but is not actually trying to take a step. new KI and RW are beneficial. Ambulation/Gait Ambulation/Gait Assistance: Not tested (comment)    Exercises Total Joint Exercises Quad Sets: AROM;Right;10 reps Heel Slides: AAROM;Right;10 reps Hip ABduction/ADduction: AAROM;Right;10 reps Straight Leg Raises: AAROM;Right;10 reps   PT Diagnosis:    PT Problem List:   PT Treatment Interventions:     PT Goals Acute Rehab PT Goals Pt will go Sit to Supine/Side: with min assist PT Goal: Sit to Supine/Side - Progress:  Progressing toward goal Pt will go Sit to Stand: with min assist PT Goal: Sit to Stand - Progress: Progressing toward goal Pt will go Stand to Sit: with min assist PT Goal: Stand to Sit - Progress: Progressing toward goal Pt will Ambulate: 16 - 50 feet;with min assist;with least restrictive assistive device PT Goal: Ambulate - Progress: Not progressing Pt will Perform Home Exercise Program: with min assist PT Goal: Perform Home Exercise Program - Progress: Progressing toward goal  Visit Information  Last PT Received On: 09/19/12 Assistance Needed: +2    Subjective Data  Subjective: i did better.   Cognition  Cognition Overall Cognitive Status: Appears within functional limits for tasks assessed/performed    Balance     End of Session PT - End of Session Activity Tolerance: Patient tolerated treatment well;Patient limited by fatigue Patient left: with call bell/phone within reach;in bed Nurse Communication: Mobility status   GP     Rada Hay 09/19/2012, 1:47 PM

## 2012-09-19 NOTE — Progress Notes (Signed)
Clinical Social Work Department BRIEF PSYCHOSOCIAL ASSESSMENT 09/19/2012  Patient:  [name], [name]     Account Number:  [id number]     Admit date:  09/17/2012  Clinical Social Worker:  Candie Chroman  Date/Time:  09/19/2012 01:18 PM  Referred by:  Physician  Date Referred:  09/19/2012 Referred for  SNF Placement   Other Referral:   Interview type:  Patient Other interview type:    PSYCHOSOCIAL DATA Living Status:  FAMILY Admitted from facility:   Level of care:   Primary support name:  Timothy Primary support relationship to patient:  SPOUSE Degree of support available:   unclear    CURRENT CONCERNS Current Concerns  Post-Acute Placement   Other Concerns:    SOCIAL WORK ASSESSMENT / PLAN Pt is a 47 yr old female living at home prior to hospitalization. CSW met with pt again this am to assist with d/c planning. Pt stated her family woiuld like her to go to rehab following hospital d/c. She is now in agreement with SNF placement. Pt has chosen [facility] for [employer]. SNF has requested prior approval from [facility] for SNF placement. Decision is pending.   Assessment/plan status:  Psychosocial Support/Ongoing Assessment of Needs Other assessment/ plan:   Information/referral to community resources:   SNF list provided.    PATIENT'S/FAMILY'S RESPONSE TO PLAN OF CARE: Pt was hoping to return home following hospital d/c but is willing to try [facility] .   Cori Razor LCSW [phone]

## 2012-09-19 NOTE — Progress Notes (Signed)
Patient ID: [name], female   DOB: 25-Mar-1966, 47 y.o.   MRN: [id number] POD 2--afebrile.  Hgb-8.9--will follow.  Family feels rehab best move--ss is seeing.  Problem voiding and foley re-inserted.

## 2012-09-19 NOTE — Progress Notes (Signed)
RT checked on patient who was already wearing CPAP. Patient was resting comfortably with no signs of distress.

## 2012-09-20 LAB — CBC
HCT: 24.7 % — ABNORMAL LOW (ref 36.0–46.0)
MCH: 30.1 pg (ref 26.0–34.0)
MCHC: 33.6 g/dL (ref 30.0–36.0)
MCV: 89.5 fL (ref 78.0–100.0)
RDW: 14.1 % (ref 11.5–15.5)

## 2012-09-20 NOTE — Progress Notes (Signed)
Occupational Therapy Treatment Patient Details Name: [name] MRN: [id number] DOB: 1965-11-09 Today's Date: 09/20/2012 Time: 4098-1191 OT Time Calculation (min): 42 min  OT Assessment / Plan / Recommendation Comments on Treatment Session Pt much improved this OT visit!                             ADL  Toilet Transfer: Performed;Minimal assistance Toilet Transfer Method: Sit to Barista: Bedside commode Toileting - Clothing Manipulation and Hygiene: Performed;Moderate assistance Where Assessed - Toileting Clothing Manipulation and Hygiene: Standing Transfers/Ambulation Related to ADLs: Pt much improved this OT visit! See PT note also.  Pt walked to door of the room after getting to and from [facility]. Pt able to stand for 5 minutes at walker for ADL activity     OT Goals ADL Goals ADL Goal: Grooming - Progress: Progressing toward goals ADL Goal: Toilet Transfer - Progress: Progressing toward goals ADL Goal: Toileting - Clothing Manipulation - Progress: Progressing toward goals  Visit Information  Last OT Received On: 09/20/12    Subjective Data  Subjective: I really have to go to the bathroom!      Cognition  Cognition Overall Cognitive Status: Appears within functional limits for tasks assessed/performed Arousal/Alertness: Awake/alert Orientation Level: Appears intact for tasks assessed Behavior During Session: [facility] for tasks performed    Mobility  Bed Mobility Supine to Sit: 4: Min assist;HOB elevated Transfers Transfers: Sit to Stand;Stand to Sit Sit to Stand: 3: Mod assist;From bed;From toilet;With upper extremity assist Stand to Sit: 3: Mod assist;To toilet;To chair/3-in-1;With armrests;With upper extremity assist          End of Session OT - End of Session Equipment Utilized During Treatment: Right knee immobilizer Activity Tolerance: Patient tolerated treatment well Patient left: in chair;with call bell/phone within reach  GO     [name], [name] 09/20/2012, 11:06 AM

## 2012-09-20 NOTE — Care Management Note (Signed)
    Page 1 of 1   09/20/2012     9:29:39 AM   CARE MANAGEMENT NOTE 09/20/2012  Patient:  [name], [name]   Account Number:  [id number]  Date Initiated:  09/19/2012  Documentation initiated by:  Colleen Can  Subjective/Objective Assessment:   DX OSTEOARTHRITIS RIGHT KNEE; TOTAL KNEE REPLACEMNT     Action/Plan:   PLANS ARE FOR SNF REHAB UPON DISCHARGE   Anticipated DC Date:  09/20/2012   Anticipated DC Plan:  SKILLED NURSING FACILITY  In-house referral  Clinical Social Worker      DC Planning Services  CM consult      Choice offered to / List presented to:             Status of service:  Completed, signed off Medicare Important Message given?   (If response is "NO", the following Medicare IM given date fields will be blank) Date Medicare IM given:   Date Additional Medicare IM given:    Discharge Disposition:    Per UR Regulation:  Reviewed for med. necessity/level of care/duration of stay  If discussed at Long Length of Stay Meetings, dates discussed:    Comments:

## 2012-09-20 NOTE — Care Management (Signed)
[facility] wil follow this pt post Skilled stay  [name]

## 2012-09-20 NOTE — Progress Notes (Signed)
Physical Therapy Treatment Patient Details Name: [name] MRN: [id number] DOB: [birth year] Today's Date: 09/20/2012 Time: 1032-1050 PT Time Calculation (min): 18 min  PT Assessment / Plan / Recommendation Comments on Treatment Session  pt is now able to ambulate a short distance today. Really improving. in good spirits. plans SNF.    Follow Up Recommendations  SNF;Supervision/Assistance - 24 hour     Does the patient have the potential to tolerate intense rehabilitation     Barriers to Discharge        Equipment Recommendations  Rolling walker with 5" wheels    Recommendations for Other Services    Frequency 7X/week   Plan Discharge plan remains appropriate    Precautions / Restrictions Precautions Precautions: Fall;Knee Required Braces or Orthoses: Knee Immobilizer - Right Knee Immobilizer - Right: Discontinue once straight leg raise with < 10 degree lag   Pertinent Vitals/Pain 3/ did not check HR    Mobility  Bed Mobility Supine to Sit: 4: Min assist;HOB elevated Transfers Sit to Stand: 4: Min guard;From bed;With upper extremity assist Stand to Sit: To bed;To chair/3-in-1;With upper extremity assist;4: Min guard Details for Transfer Assistance: Pt. is taking more weight on RLE, able to stand up with no assist. Ambulation/Gait Ambulation/Gait Assistance: 1: +2 Total assist Ambulation/Gait: Patient Percentage: 80% Ambulation Distance (Feet): 20 Feet Assistive device: Rolling walker Ambulation/Gait Assistance Details: pt was able to take steps today with RW, Gait Pattern: Step-to pattern;Decreased step length - right    Exercises     PT Diagnosis:    PT Problem List:   PT Treatment Interventions:     PT Goals Acute Rehab PT Goals Pt will go Sit to Stand: with supervision PT Goal: Sit to Stand - Progress: Updated due to goal met Pt will go Stand to Sit: with supervision PT Goal: Stand to Sit - Progress: Updated due to goals met Pt will Ambulate: 16 - 50  feet;with min assist;with least restrictive assistive device PT Goal: Ambulate - Progress: Progressing toward goal PT Goal: Up/Down Stairs - Progress: Discontinued (comment)  Visit Information  Last PT Received On: 09/20/12 Assistance Needed: +2 (follow w/ chair)    Subjective Data  Subjective: I will try.   Cognition  Cognition Overall Cognitive Status: Appears within functional limits for tasks assessed/performed Arousal/Alertness: Awake/alert Orientation Level: Appears intact for tasks assessed Behavior During Session: [facility] for tasks performed    Balance     End of Session PT - End of Session Activity Tolerance: Patient tolerated treatment well;Patient limited by fatigue Patient left: with call bell/phone within reach;in chair Nurse Communication: Mobility status   GP     Rada Hay 09/20/2012, 11:17 AM

## 2012-09-20 NOTE — Progress Notes (Signed)
Patient ID: [name], female   DOB: 12/16/1965, 47 y.o.   MRN: [id number] POD 3--continues to improve.  Afebrile.  Hgb still acceptable at 8.3 with no symptoms of anemia.  Wound dressed and clean and dry.  She may shower.  Plann--SNF.  Papers have been signed.  Will chek hgb in am.

## 2012-09-20 NOTE — Progress Notes (Signed)
CSW assisting with d/c planning. [facility] has been contacted . [employer] has not made a decision regarding authorization for ST SNF placement. [employer] is not open during the weekend. CSW will assist with d/c planning to SNF on Monday.   Cori Razor LCSW [phone]

## 2012-09-20 NOTE — Progress Notes (Signed)
Physical Therapy Treatment Patient Details Name: DEBRIA BROECKER MRN: [id number] DOB: Jun 20, 1966 Today's Date: 09/20/2012 Time: 7829-5621 PT Time Calculation (min): 15 min  PT Assessment / Plan / Recommendation Comments on Treatment Session  pt has been up to [facility], now in bed. tolerated exercises well. performed 1 SLR.     Follow Up Recommendations  SNF;Supervision/Assistance - 24 hour     Does the patient have the potential to tolerate intense rehabilitation     Barriers to Discharge        Equipment Recommendations  Rolling walker with 5" wheels    Recommendations for Other Services    Frequency 7X/week   Plan Discharge plan remains appropriate    Precautions / Restrictions     Pertinent Vitals/Pain     Mobility       Exercises Total Joint Exercises Quad Sets: AROM;Right;10 reps Short Arc Quad: AAROM;10 reps;Right Heel Slides: AAROM;Right;10 reps Hip ABduction/ADduction: AAROM;Right;10 reps Straight Leg Raises: AAROM;Right;10 reps   PT Diagnosis:    PT Problem List:   PT Treatment Interventions:     PT Goals Acute Rehab PT Goals Pt will Perform Home Exercise Program: with supervision, verbal cues required/provided;with min assist PT Goal: Perform Home Exercise Program - Progress: Progressing toward goal  Visit Information  Last PT Received On: 09/20/12 Assistance Needed: +1    Subjective Data      Cognition       Balance     End of Session     GP     Rada Hay 09/20/2012, 3:19 PM

## 2012-09-20 NOTE — Progress Notes (Signed)
RT checked with patient who states  that would like to put herself on CPAP. RT added sterile water to fill line and encouraged patient to call if she needed any assistance throughout the night. Patient has her own home mask.

## 2012-09-21 DIAGNOSIS — Z96659 Presence of unspecified artificial knee joint: Secondary | ICD-10-CM

## 2012-09-21 LAB — CBC
MCH: 30.4 pg (ref 26.0–34.0)
MCHC: 33.6 g/dL (ref 30.0–36.0)
Platelets: 276 10*3/uL (ref 150–400)

## 2012-09-21 NOTE — Progress Notes (Signed)
Physical Therapy Treatment Patient Details Name: [name] MRN: [id number] DOB: 1965-08-09 Today's Date: 09/21/2012 Time: 1430-1500 PT Time Calculation (min): 30 min  PT Assessment / Plan / Recommendation Comments on Treatment Session  improving slowly with endurance and strengtjh    Follow Up Recommendations  SNF;Supervision/Assistance - 24 hour     Does the patient have the potential to tolerate intense rehabilitation     Barriers to Discharge        Equipment Recommendations  Rolling walker with 5" wheels    Recommendations for Other Services    Frequency 7X/week   Plan Discharge plan remains appropriate    Precautions / Restrictions Precautions Precaution Comments: has hi HR Required Braces or Orthoses: Knee Immobilizer - Right Knee Immobilizer - Right: Discontinue once straight leg raise with < 10 degree lag   Pertinent Vitals/Pain     Mobility  Bed Mobility Sit to Supine: 4: Min assist Details for Bed Mobility Assistance: support for RLE onto bed. Transfers Sit to Stand: 4: Min guard;With upper extremity assist;From chair/3-in-1 Stand to Sit: To bed;With upper extremity assist;4: Min guard Ambulation/Gait Ambulation/Gait Assistance: 1: +2 Total assist Ambulation/Gait: Patient Percentage: 80% Ambulation Distance (Feet): 40 Feet Assistive device: Rolling walker Ambulation/Gait Assistance Details: pt has to stop and stand for breaks for UE rest and breathing. Gait Pattern: Step-to pattern;Decreased step length - right    Exercises Total Joint Exercises Quad Sets: AROM;Right;10 reps Short Arc Quad: AAROM;10 reps;Right Heel Slides: AAROM;Right;10 reps Hip ABduction/ADduction: AAROM;Right;10 reps Straight Leg Raises: AAROM;Right;10 reps   PT Diagnosis:    PT Problem List:   PT Treatment Interventions:     PT Goals Acute Rehab PT Goals Pt will go Sit to Supine/Side: with min assist;with supervision PT Goal: Sit to Supine/Side - Progress: Updated due  to goal met Pt will go Sit to Stand: with supervision PT Goal: Sit to Stand - Progress: Progressing toward goal Pt will go Stand to Sit: with supervision PT Goal: Stand to Sit - Progress: Progressing toward goal Pt will Ambulate: 16 - 50 feet;with min assist;with least restrictive assistive device PT Goal: Ambulate - Progress: Progressing toward goal Pt will Perform Home Exercise Program: with supervision, verbal cues required/provided;with min assist PT Goal: Perform Home Exercise Program - Progress: Progressing toward goal  Visit Information  Last PT Received On: 09/21/12 Assistance Needed: +2    Subjective Data  Subjective: I have had all tests, nothing is wrong with my heart.   Cognition  Cognition Overall Cognitive Status: Appears within functional limits for tasks assessed/performed    Balance     End of Session PT - End of Session Activity Tolerance: Patient tolerated treatment well;Patient limited by fatigue Patient left: with call bell/phone within reach;in chair;with family/visitor present Nurse Communication: Mobility status   GP     Rada Hay 09/21/2012, 5:05 PM

## 2012-09-21 NOTE — Progress Notes (Signed)
   Subjective: 4 Days Post-Op Procedure(s) (LRB): TOTAL KNEE ARTHROPLASTY (Right)   Patient reports pain as mild, pain is well controlled. No events throughout the night. States that she is a little slow on the PT, but she has a bad ankle which doesn't help. She states that she wasn't walking long distances prior to the surgery.  Objective:   VITALS:   Filed Vitals:   09/21/12 0452  BP: 107/63  Pulse: 119  Temp: 97.7 F (36.5 C)  Resp: 18    Neurovascular intact Dorsiflexion/Plantar flexion intact Incision: dressing C/D/I No cellulitis present Compartment soft  LABS  Recent Labs  09/19/12 0410 09/20/12 0405 09/21/12 0407  HGB 8.9* 8.3* 8.3*  HCT 26.0* 24.7* 24.7*  WBC 9.4 10.6* 11.2*  PLT 204 214 276       Assessment/Plan: 4 Days Post-Op Procedure(s) (LRB): TOTAL KNEE ARTHROPLASTY (Right) Dressing changed with 4x4 guaze and tape, tolerated well. Up with therapy Discharge to SNF eventually, when ready   Anastasio Auerbach. Zlatan Hornback   PAC  09/21/2012, 12:17 PM

## 2012-09-21 NOTE — Progress Notes (Signed)
Physical Therapy Treatment Patient Details Name: [name] MRN: [id number] DOB: Oct 22, 1965 Today's Date: 09/21/2012 Time: 5621-3086 PT Time Calculation (min): 38 min  PT Assessment / Plan / Recommendation Comments on Treatment Session  Pt. slowly improving. Limited by dyspnea and fatigue. HR remains up to 140's with activity. recommend SNF as pt does not have 4/7 caregivers.    Follow Up Recommendations  SNF;Supervision/Assistance - 24 hour     Does the patient have the potential to tolerate intense rehabilitation     Barriers to Discharge        Equipment Recommendations  Rolling walker with 5" wheels    Recommendations for Other Services    Frequency 7X/week   Plan Discharge plan remains appropriate    Precautions / Restrictions Precautions Precaution Comments: has hi HR Required Braces or Orthoses: Knee Immobilizer - Right Knee Immobilizer - Right: Discontinue once straight leg raise with < 10 degree lag   Pertinent Vitals/Pain HR 149 after ambulation, sats 98%    Mobility  Transfers Sit to Stand: 4: Min guard;With upper extremity assist;From chair/3-in-1 Stand to Sit: To bed;To chair/3-in-1;With upper extremity assist;4: Min guard Ambulation/Gait Ambulation/Gait Assistance: 1: +2 Total assist Ambulation/Gait: Patient Percentage: 80% Ambulation Distance (Feet): 36 Feet Assistive device: Rolling walker Ambulation/Gait Assistance Details: pt has to stop and stand for breaks for UE rest and breathing. Gait Pattern: Step-to pattern;Decreased step length - right    Exercises     PT Diagnosis:    PT Problem List:   PT Treatment Interventions:     PT Goals Acute Rehab PT Goals Pt will go Sit to Stand: with supervision PT Goal: Sit to Stand - Progress: Progressing toward goal Pt will go Stand to Sit: with supervision PT Goal: Stand to Sit - Progress: Progressing toward goal Pt will Ambulate: 16 - 50 feet;with min assist;with least restrictive assistive  device PT Goal: Ambulate - Progress: Progressing toward goal Pt will Perform Home Exercise Program: with supervision, verbal cues required/provided;with min assist PT Goal: Perform Home Exercise Program - Progress: Progressing toward goal  Visit Information  Last PT Received On: 09/21/12 Assistance Needed: +2    Subjective Data  Subjective: my HR is always high   Cognition  Cognition Overall Cognitive Status: Appears within functional limits for tasks assessed/performed    Balance     End of Session PT - End of Session Activity Tolerance: Patient tolerated treatment well;Patient limited by fatigue Patient left: with call bell/phone within reach;in chair;with family/visitor present Nurse Communication: Mobility status   GP     Rada Hay 09/21/2012, 5:02 PM

## 2012-09-22 NOTE — Progress Notes (Signed)
Subjective: 5 Days Post-Op Procedure(s) (LRB): TOTAL KNEE ARTHROPLASTY (Right) Patient reports pain as mild.  Notes progress with PT but does have difficulty due to chronic L ankle pain and L knee DJD. No other c/o.   Objective: Vital signs in last 24 hours: Temp:  [98.4 F (36.9 C)-98.5 F (36.9 C)] 98.4 F (36.9 C) (03/09 0500) Pulse Rate:  [106-130] 106 (03/09 0500) Resp:  [17-18] 18 (03/09 0500) BP: (112-130)/(70-82) 112/77 mmHg (03/09 0500) SpO2:  [93 %-95 %] 94 % (03/09 0500)  Intake/Output from previous day: 03/08 0701 - 03/09 0700 In: 1080 [P.O.:1080] Out: 2900 [Urine:2900] Intake/Output this shift:     Recent Labs  09/20/12 0405 09/21/12 0407  HGB 8.3* 8.3*    Recent Labs  09/20/12 0405 09/21/12 0407  WBC 10.6* 11.2*  RBC 2.76* 2.73*  HCT 24.7* 24.7*  PLT 214 276   No results found for this basename: NA, K, CL, CO2, BUN, CREATININE, GLUCOSE, CALCIUM,  in the last 72 hours No results found for this basename: LABPT, INR,  in the last 72 hours  Neurologically intact Neurovascular intact Sensation intact distally Intact pulses distally Dorsiflexion/Plantar flexion intact Incision: dressing C/D/I and no drainage No cellulitis present Compartment soft No calf pain or sign of DVT  Assessment/Plan: 5 Days Post-Op Procedure(s) (LRB): TOTAL KNEE ARTHROPLASTY (Right) Up with therapy Discharge to SNF when ready, likely tomorrow  Andrez Grime M. 09/22/2012, 8:18 AM

## 2012-09-22 NOTE — Progress Notes (Signed)
Physical Therapy Treatment Patient Details Name: [name] MRN: [id number] DOB: [birth year] Today's Date: 09/22/2012 Time: 1478-2956 PT Time Calculation (min): 37 min  PT Assessment / Plan / Recommendation Comments on Treatment Session  improving. R knee and lower leg quite edematous and tight. encouraged to keep head low and elevate Loer leg. hopeful for SNF tomorrow. If no, then need to try pt with a 4 wheeled RW- does not have 2 wheeled, also 1 STE.    Follow Up Recommendations  SNF;Supervision/Assistance - 24 hour     Does the patient have the potential to tolerate intense rehabilitation     Barriers to Discharge        Equipment Recommendations  Rolling walker with 5" wheels (pt has a 4 wheeled RW)    Recommendations for Other Services    Frequency 7X/week   Plan Discharge plan remains appropriate    Precautions / Restrictions Precautions Precautions: Fall;Knee Precaution Comments: HR increases 130-140's   Pertinent Vitals/Pain astates back of R knee is very tight. HR 136 after walk.    Mobility  Bed Mobility Sit to Supine: 4: Min guard Details for Bed Mobility Assistance: pt able to use sheet to lift leg onto bed. Transfers Sit to Stand: 4: Min guard;From chair/3-in-1 Stand to Sit: 4: Min assist;To bed;4: Min guard Details for Transfer Assistance: supporty of RLE for sit/stand Ambulation/Gait Ambulation/Gait Assistance: 1: +2 Total assist Ambulation/Gait: Patient Percentage: 80% Ambulation Distance (Feet): 100 Feet Assistive device: Rolling walker Ambulation/Gait Assistance Details: recliner had to be brought to pt due to fatigue and L foot pain. Gait Pattern: Step-through pattern;Decreased step length - right;Decreased stance time - right Gait velocity: decr.    Exercises Total Joint Exercises Short Arc Quad: AAROM;10 reps;Right Heel Slides: AAROM;Right;10 reps Hip ABduction/ADduction: AAROM;Right;10 reps Straight Leg Raises: AAROM;Right;10 reps   PT  Diagnosis:    PT Problem List:   PT Treatment Interventions:     PT Goals Acute Rehab PT Goals Pt will go Sit to Supine/Side: with modified independence PT Goal: Sit to Supine/Side - Progress: Updated due to goal met Pt will go Sit to Stand: with supervision PT Goal: Sit to Stand - Progress: Progressing toward goal Pt will go Stand to Sit: with supervision PT Goal: Stand to Sit - Progress: Progressing toward goal Pt will Ambulate: with min assist;with least restrictive assistive device;51 - 150 feet PT Goal: Ambulate - Progress: Updated due to goal met Pt will Perform Home Exercise Program: with supervision, verbal cues required/provided;with min assist PT Goal: Perform Home Exercise Program - Progress: Progressing toward goal  Visit Information  Last PT Received On: 09/22/12 Assistance Needed: +2    Subjective Data  Subjective: the back of my knee feels like it is going to bust open   Cognition  Cognition Overall Cognitive Status: Appears within functional limits for tasks assessed/performed    Balance     End of Session PT - End of Session Activity Tolerance: Patient tolerated treatment well;Patient limited by fatigue Patient left: in bed;with call bell/phone within reach;with family/visitor present Nurse Communication: Mobility status   GP     Rada Hay 09/22/2012, 4:18 PM

## 2012-09-22 NOTE — Progress Notes (Signed)
Physical Therapy Treatment Patient Details Name: [name] MRN: [id number] DOB: 1965-08-31 Today's Date: 09/22/2012 Time: 4098-1191 PT Time Calculation (min): 19 min  PT Assessment / Plan / Recommendation Comments on Treatment Session  Pt. slowly improving in endurance and functional mobility. continues to have dyspnea and increased HR. Pt. will benefit from SNF as she does not have 24/7 caregivers.     Follow Up Recommendations  SNF;Supervision/Assistance - 24 hour     Does the patient have the potential to tolerate intense rehabilitation     Barriers to Discharge        Equipment Recommendations  Rolling walker with 5" wheels (pt has a 4 wheeled RW)    Recommendations for Other Services    Frequency 7X/week   Plan Discharge plan remains appropriate    Precautions / Restrictions Precautions Precautions: Fall;Knee Precaution Comments: HR increases 130-140's   Pertinent Vitals/Pain 4/10 R knee    Mobility  Transfers Sit to Stand: 4: Min guard Stand to Sit: To chair/3-in-1;4: Min assist Details for Transfer Assistance: supporty of RLE for sit/stand Ambulation/Gait Ambulation/Gait Assistance: 1: +2 Total assist Ambulation/Gait: Patient Percentage: 80% Ambulation Distance (Feet): 60 Feet Ambulation/Gait Assistance Details: pt stopped x 2 for rest breaks standing. Gait Pattern: Step-through pattern;Decreased step length - right;Decreased stance time - right Gait velocity: decr.    Exercises     PT Diagnosis:    PT Problem List:   PT Treatment Interventions:     PT Goals Acute Rehab PT Goals Pt will go Sit to Stand: with supervision PT Goal: Sit to Stand - Progress: Progressing toward goal Pt will go Stand to Sit: with supervision PT Goal: Stand to Sit - Progress: Progressing toward goal Pt will Ambulate: with min assist;with least restrictive assistive device;51 - 150 feet PT Goal: Ambulate - Progress: Updated due to goal met  Visit Information  Last PT  Received On: 09/22/12 Assistance Needed: +2    Subjective Data  Subjective: I want to walk more, I'll try without the KI   Cognition  Cognition Overall Cognitive Status: Appears within functional limits for tasks assessed/performed    Balance     End of Session PT - End of Session Activity Tolerance: Patient tolerated treatment well;Patient limited by fatigue Patient left: in chair;with family/visitor present;with call bell/phone within reach Nurse Communication: Mobility status   GP     Rada Hay 09/22/2012, 4:12 PM

## 2012-09-23 MED ORDER — RIVAROXABAN 10 MG PO TABS: 10.0000 mg | ORAL_TABLET | Freq: Every day | ORAL | Status: DC

## 2012-09-23 MED ORDER — HYDROCODONE-ACETAMINOPHEN 10-325 MG PO TABS: 1.0000 | ORAL_TABLET | Freq: Four times a day (QID) | ORAL | Status: DC | PRN

## 2012-09-23 NOTE — Progress Notes (Signed)
Comments:  09/23/2012 Carylon Rishikesh Khachatryan BSN RN CCM [phone] PT HAS DECIDED TO GO HOME WITH HH SERVICES. STATES SHE ALREADY HAS DME AND THAT HER SPOUSE AND MOTHER WILL BE CAREGIVERS. [facility] WILL PROVIDE HHPT WITH START DATE OF TOMORROW-09/24/2012.

## 2012-09-23 NOTE — Progress Notes (Signed)
CSW contacted by nsg at pt's request. Pt has decided to d/c home with [facility] services. RNCM to assist with d/c planning.  Cori Razor LCSW [phone]

## 2012-09-23 NOTE — Progress Notes (Signed)
Physical Therapy Treatment Patient Details Name: [name] MRN: [id number] DOB: [birth year] Today's Date: 09/23/2012 Time: 0817-0850 PT Time Calculation (min): 33 min  PT Assessment / Plan / Recommendation Comments on Treatment Session  Pt continues to make slow progress and is tolerating increased amb, however continues to require multiple rest breaks with HR up to 131 today.  Had pt practice in/out shower (simulated single step to get in house) in case of D/C home.  Feel that she would benefit from ST SNF to further progress mobility.     Follow Up Recommendations  SNF;Supervision/Assistance - 24 hour     Does the patient have the potential to tolerate intense rehabilitation     Barriers to Discharge        Equipment Recommendations  Other (comment) (Pt states she can get wide RW from friend. Please confirm with pt) Continue to discourage pt from using rollator at this time, as she needs increased stability from RW.    Recommendations for Other Services    Frequency 7X/week   Plan Discharge plan remains appropriate    Precautions / Restrictions Precautions Precautions: Knee Precaution Comments: HR up to 131 during amb today.  Restrictions Weight Bearing Restrictions: No Other Position/Activity Restrictions: WBAT   Pertinent Vitals/Pain HR to 131 with amb.      Mobility  Bed Mobility Bed Mobility: Supine to Sit Supine to Sit: 5: Supervision;HOB elevated Details for Bed Mobility Assistance: Pt used leg lifter to assist RLE out of bed.  She continues to increase HOB degrees to self assist, however provided max cues for keeping HOB flat to better simulate home environment.  Pt states she wants to use handrails and bed as long as possible.  Transfers Transfers: Sit to Stand;Stand to [employer] Transfers Sit to Stand: 5: Supervision;With upper extremity assist;From elevated surface;From bed Stand to Sit: 5: Supervision;To chair/3-in-1;With armrests Details for  Transfer Assistance: cues for hand placement and safety when sitting/standing.  Ambulation/Gait Ambulation/Gait Assistance: 4: Min guard;4: Min Environmental consultant (Feet): 140 Feet Assistive device: Rolling walker Ambulation/Gait Assistance Details: Min cues for upright posture and rest breaks as needed.  Requires 2-3 rest breaks today with HR up to 131 with amb.  Gait Pattern: Step-to pattern;Decreased stride length;Antalgic Gait velocity: decr. Stairs: Yes Stairs Assistance: 4: Min guard;4: Min assist Stairs Assistance Details (indicate cue type and reason): Assist to steady with cues for sequencing/technique with RW.  Simulated single step with getting in/out of shower.  Stair Management Technique: No rails;Step to pattern;Backwards;Forwards;With walker Number of Stairs: 1    Exercises Total Joint Exercises Ankle Circles/Pumps: AROM;Both;20 reps Quad Sets: AROM;Right;10 reps Short Arc Quad: AAROM;10 reps;Right Heel Slides: AAROM;Right;10 reps Hip ABduction/ADduction: AAROM;Right;10 reps Straight Leg Raises: AAROM;Right;10 reps   PT Diagnosis:    PT Problem List:   PT Treatment Interventions:     PT Goals Acute Rehab PT Goals PT Goal Formulation: With patient Time For Goal Achievement: 09/25/12 Potential to Achieve Goals: Good Pt will go Supine/Side to Sit: with supervision;with HOB 0 degrees PT Goal: Supine/Side to Sit - Progress: Progressing toward goal Pt will go Sit to Stand: with modified independence PT Goal: Sit to Stand - Progress: Updated due to goal met Pt will go Stand to Sit: with modified independence PT Goal: Stand to Sit - Progress: Updated due to goals met Pt will Ambulate: 51 - 150 feet;with supervision;with least restrictive assistive device PT Goal: Ambulate - Progress: Updated due to goal met Pt will Go Up /  Down Stairs: 1-2 stairs;with mod assist;with least restrictive assistive device PT Goal: Up/Down Stairs - Progress: Met Pt will Perform Home  Exercise Program: with supervision, verbal cues required/provided;with min assist PT Goal: Perform Home Exercise Program - Progress: Progressing toward goal  Visit Information  Last PT Received On: 09/23/12 Assistance Needed: +1    Subjective Data  Subjective: I'm just having to work through the pain.  Patient Stated Goal: to return home.    Cognition  Cognition Overall Cognitive Status: Appears within functional limits for tasks assessed/performed Arousal/Alertness: Awake/alert Orientation Level: Appears intact for tasks assessed Behavior During Session: [facility] for tasks performed    Balance     End of Session PT - End of Session Activity Tolerance: Patient limited by pain;Patient limited by fatigue Patient left: in chair;with call bell/phone within reach;with family/visitor present Nurse Communication: Mobility status CPM Right Knee CPM Right Knee: Off   GP     Vista Deck 09/23/2012, 9:54 AM

## 2012-09-23 NOTE — Progress Notes (Signed)
Occupational Therapy Treatment Patient Details Name: [name] MRN: [id number] DOB: [birth year] Today's Date: 09/23/2012 Time: 4696-2952 OT Time Calculation (min): 28 min  OT Assessment / Plan / Recommendation Comments on Treatment Session continuing to improve!    Follow Up Recommendations  SNF;Other (comment) (if SNF not approved- then HHOT and bath aide)       Equipment Recommendations  Other (comment) (pt states she can borrow 3 n 1)             Precautions / Restrictions Precautions Precautions: None Restrictions Weight Bearing Restrictions: No       ADL  Tub/Shower Transfer: Performed;Minimal assistance Tub/Shower Transfer Equipment: Walk in shower Transfers/Ambulation Related to ADLs: verbal cues for technique ADL Comments: Pt did voice concern regarding toilet hygiene.  Pt will obtain a toilet hygiene assist that she will order.  Pt has a reacher, sock aid, long handled sponge in which she verbalizes understanding in how to use.  Pt much improved, but still feels as if she needs rehab.        OT Goals ADL Goals ADL Goal: Toileting - Clothing Manipulation - Progress: Progressing toward goals Pt Will Perform Tub/Shower Transfer: Shower transfer;with supervision ADL Goal: Tub/Shower Transfer - Progress: Goal set today  Visit Information  Last OT Received On: 09/23/12          Cognition  Cognition Overall Cognitive Status: Appears within functional limits for tasks assessed/performed Arousal/Alertness: Awake/alert Orientation Level: Appears intact for tasks assessed Behavior During Session: [facility] for tasks performed    Mobility  Bed Mobility Bed Mobility: Supine to Sit Supine to Sit: 5: Supervision Transfers Transfers: Sit to Stand;Stand to Sit Sit to Stand: 5: Supervision;With upper extremity assist;From chair/3-in-1 Stand to Sit: 5: Supervision;With upper extremity assist;To chair/3-in-1          End of Session OT - End of Session Activity  Tolerance: Patient tolerated treatment well Patient left: in chair CPM Right Knee CPM Right Knee: Off  GO     Alba Cory 09/23/2012, 8:48 AM
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
## 2012-09-23 NOTE — Progress Notes (Signed)
Patient ID: [name], female   DOB: 03-04-1966, 47 y.o.   MRN: [id number] She is ready to go to rehab today--afebrile, hgb is stable at 8.3and bed is available.  Return office 2 wks from surgery.

## 2012-09-23 NOTE — Progress Notes (Signed)
Pt to d/c home with [facility]. Pt has no DME needs. Scripts given to pt. AVS reviewed and "My Chart" discussed with pt. Pt capable of verbalizing medications and follow-up appointments. Remains hemodynamically stable. No signs and symptoms of distress. Educated pt to return to ER in the case of SOB, dizziness, or chest pain.

## 2013-01-06 ENCOUNTER — Other Ambulatory Visit: Payer: Self-pay | Admitting: Orthopedic Surgery

## 2013-01-06 NOTE — Progress Notes (Signed)
Need orders in EPIC.  Surgery scheduled for 01/10/13.  Thank You.

## 2013-01-07 ENCOUNTER — Encounter (HOSPITAL_COMMUNITY): Payer: Self-pay | Admitting: Pharmacy Technician

## 2013-01-09 ENCOUNTER — Encounter (HOSPITAL_COMMUNITY): Payer: Self-pay

## 2013-01-09 ENCOUNTER — Encounter (HOSPITAL_COMMUNITY)
Admission: RE | Admit: 2013-01-09 | Discharge: 2013-01-09 | Disposition: A | Discharge status: Home / Self Care | Payer: BC Managed Care – PPO | Source: Ambulatory Visit | Attending: Orthopedic Surgery | Admitting: Orthopedic Surgery

## 2013-01-09 HISTORY — DX: Gastro-esophageal reflux disease without esophagitis: K21.9

## 2013-01-09 LAB — BASIC METABOLIC PANEL
BUN: 14 mg/dL (ref 6–23)
Chloride: 97 mEq/L (ref 96–112)
Creatinine, Ser: 0.63 mg/dL (ref 0.50–1.10)
GFR calc Af Amer: >90 mL/min (ref >90)
GFR calc non Af Amer: >90 mL/min (ref >90)
Potassium: 3.9 mEq/L (ref 3.5–5.1)

## 2013-01-09 LAB — CBC
MCHC: 32 g/dL (ref 30.0–36.0)
MCV: 82.8 fL (ref 78.0–100.0)
Platelets: 229 10*3/uL (ref 150–400)
RDW: 16.7 % — ABNORMAL HIGH (ref 11.5–15.5)
WBC: 7.3 10*3/uL (ref 4.0–10.5)

## 2013-01-09 LAB — SURGICAL PCR SCREEN: Staphylococcus aureus: NEGATIVE

## 2013-01-09 LAB — HCG, SERUM, QUALITATIVE: Preg, Serum: NEGATIVE

## 2013-01-09 NOTE — Patient Instructions (Addendum)
YOUR SURGERY IS SCHEDULED AT [facility]  ON:  Friday 6/27  REPORT TO [facility] AT:  3:30 PM      PHONE # FOR SHORT STAY IS [phone]  DO NOT EAT ANYTHING AFTER MIDNIGHT THE NIGHT BEFORE YOUR SURGERY.  NO FOOD, NO CHEWING GUM, NO MINTS, NO CANDIES, NO CHEWING TOBACCO. CLEAR LIQUIDS FROM MIDNIGHT UNTIL 11:30 AM DAY OF SURGERY - LIKE WATER, MOUNTAIN DEW, DR .PEPPER, APPLE JUICE.   NOTHING TO DRINK AFTER 11:30 AM DAY OF SURGERY.  PLEASE TAKE THE FOLLOWING MEDICATIONS THE AM OF YOUR SURGERY WITH A FEW SIPS OF WATER:   ABILIFY, CYMBALTA, TOVIAZ, HYDROCODONE / ACETAMINOPHEN, SYNTHROID, CLARITIN.   IF YOU HAVE SLEEP APNEA AND USE CPAP OR BIPAP--PLEASE BRING THE MASK AND THE TUBING.  DO NOT BRING YOUR MACHINE.  DO NOT BRING VALUABLES, MONEY, CREDIT CARDS.  DO NOT WEAR JEWELRY, MAKE-UP, NAIL POLISH AND NO METAL PINS OR CLIPS IN YOUR HAIR. CONTACT LENS, DENTURES / PARTIALS, GLASSES SHOULD NOT BE WORN TO SURGERY AND IN MOST CASES-HEARING AIDS WILL NEED TO BE REMOVED.  BRING YOUR GLASSES CASE, ANY EQUIPMENT NEEDED FOR YOUR CONTACT LENS. FOR PATIENTS ADMITTED TO THE HOSPITAL--CHECK OUT TIME THE DAY OF DISCHARGE IS 11:00 AM.  ALL INPATIENT ROOMS ARE PRIVATE - WITH BATHROOM, TELEPHONE, TELEVISION AND WIFI INTERNET.  IF YOU ARE BEING DISCHARGED THE SAME DAY OF YOUR SURGERY--YOU CAN NOT DRIVE YOURSELF HOME--AND SHOULD NOT GO HOME ALONE BY TAXI OR BUS.  NO DRIVING OR OPERATING MACHINERY FOR 24 HOURS FOLLOWING ANESTHESIA / PAIN MEDICATIONS.  PLEASE MAKE ARRANGEMENTS FOR SOMEONE TO BE WITH YOU AT HOME THE FIRST 24 HOURS AFTER SURGERY. RESPONSIBLE DRIVER'S NAME     PT'S HUSBAND OR MOTHER WILL DRIVE HER HOME                                                                     FAILURE TO FOLLOW THESE INSTRUCTIONS MAY RESULT IN THE CANCELLATION OF YOUR SURGERY.   PATIENT SIGNATURE_________________________________

## 2013-01-09 NOTE — Pre-Procedure Instructions (Signed)
CXR REPORT IN EPIC FROM 09/10/12.  EKG WAS DONE TODAY PREOP AT [facility].

## 2013-01-10 ENCOUNTER — Encounter (HOSPITAL_COMMUNITY)
Admission: RE | Disposition: A | Discharge status: Home / Self Care | Payer: Self-pay | Source: Ambulatory Visit | Attending: Orthopedic Surgery

## 2013-01-10 ENCOUNTER — Ambulatory Visit (HOSPITAL_COMMUNITY)
Admission: RE | Admit: 2013-01-10 | Discharge: 2013-01-10 | Disposition: A | Discharge status: Home / Self Care | Payer: BC Managed Care – PPO | Source: Ambulatory Visit | Attending: Orthopedic Surgery | Admitting: Orthopedic Surgery

## 2013-01-10 ENCOUNTER — Ambulatory Visit (HOSPITAL_COMMUNITY): Payer: BC Managed Care – PPO | Admitting: Anesthesiology

## 2013-01-10 ENCOUNTER — Encounter (HOSPITAL_COMMUNITY): Payer: Self-pay | Admitting: Anesthesiology

## 2013-01-10 ENCOUNTER — Encounter (HOSPITAL_COMMUNITY): Payer: Self-pay | Admitting: *Deleted

## 2013-01-10 DIAGNOSIS — Z79899 Other long term (current) drug therapy: Secondary | ICD-10-CM | POA: Insufficient documentation

## 2013-01-10 DIAGNOSIS — I10 Essential (primary) hypertension: Secondary | ICD-10-CM | POA: Insufficient documentation

## 2013-01-10 DIAGNOSIS — K219 Gastro-esophageal reflux disease without esophagitis: Secondary | ICD-10-CM | POA: Insufficient documentation

## 2013-01-10 DIAGNOSIS — G4730 Sleep apnea, unspecified: Secondary | ICD-10-CM | POA: Insufficient documentation

## 2013-01-10 DIAGNOSIS — M25669 Stiffness of unspecified knee, not elsewhere classified: Secondary | ICD-10-CM | POA: Insufficient documentation

## 2013-01-10 DIAGNOSIS — Z888 Allergy status to other drugs, medicaments and biological substances status: Secondary | ICD-10-CM | POA: Insufficient documentation

## 2013-01-10 DIAGNOSIS — F411 Generalized anxiety disorder: Secondary | ICD-10-CM | POA: Insufficient documentation

## 2013-01-10 DIAGNOSIS — Z96651 Presence of right artificial knee joint: Secondary | ICD-10-CM

## 2013-01-10 DIAGNOSIS — M129 Arthropathy, unspecified: Secondary | ICD-10-CM | POA: Insufficient documentation

## 2013-01-10 DIAGNOSIS — Y831 Surgical operation with implant of artificial internal device as the cause of abnormal reaction of the patient, or of later complication, without mention of misadventure at the time of the procedure: Secondary | ICD-10-CM | POA: Insufficient documentation

## 2013-01-10 DIAGNOSIS — T84099A Other mechanical complication of unspecified internal joint prosthesis, initial encounter: Secondary | ICD-10-CM | POA: Insufficient documentation

## 2013-01-10 DIAGNOSIS — E039 Hypothyroidism, unspecified: Secondary | ICD-10-CM | POA: Insufficient documentation

## 2013-01-10 DIAGNOSIS — F3130 Bipolar disorder, current episode depressed, mild or moderate severity, unspecified: Secondary | ICD-10-CM | POA: Insufficient documentation

## 2013-01-10 DIAGNOSIS — E785 Hyperlipidemia, unspecified: Secondary | ICD-10-CM | POA: Insufficient documentation

## 2013-01-10 DIAGNOSIS — Z885 Allergy status to narcotic agent status: Secondary | ICD-10-CM | POA: Insufficient documentation

## 2013-01-10 DIAGNOSIS — Z96659 Presence of unspecified artificial knee joint: Secondary | ICD-10-CM | POA: Insufficient documentation

## 2013-01-10 DIAGNOSIS — IMO0001 Reserved for inherently not codable concepts without codable children: Secondary | ICD-10-CM | POA: Insufficient documentation

## 2013-01-10 HISTORY — PX: KNEE CLOSED REDUCTION: SHX995

## 2013-01-10 SURGERY — MANIPULATION, KNEE, CLOSED
Anesthesia: General | Site: Knee | Laterality: Right | Wound class: Clean

## 2013-01-10 MED ORDER — LACTATED RINGERS IV SOLN: INTRAVENOUS | Status: DC

## 2013-01-10 MED ORDER — PROMETHAZINE HCL 25 MG/ML IJ SOLN: 6.2500 mg | INTRAMUSCULAR | Status: DC | PRN

## 2013-01-10 MED ORDER — HYDROMORPHONE HCL PF 1 MG/ML IJ SOLN
INTRAMUSCULAR | Status: AC
  Filled 2013-01-10: qty 1

## 2013-01-10 MED ORDER — FENTANYL CITRATE 0.05 MG/ML IJ SOLN
INTRAMUSCULAR | Status: DC | PRN
  Administered 2013-01-10: 100 ug via INTRAVENOUS

## 2013-01-10 MED ORDER — SUCCINYLCHOLINE CHLORIDE 20 MG/ML IJ SOLN
INTRAMUSCULAR | Status: DC | PRN
  Administered 2013-01-10: 100 mg via INTRAVENOUS

## 2013-01-10 MED ORDER — PROPOFOL 10 MG/ML IV BOLUS
INTRAVENOUS | Status: DC | PRN
  Administered 2013-01-10 (×2): 100 mg via INTRAVENOUS
  Administered 2013-01-10: 200 mg via INTRAVENOUS

## 2013-01-10 MED ORDER — LACTATED RINGERS IV SOLN
INTRAVENOUS | Status: DC | PRN
  Administered 2013-01-10: 16:00:00 via INTRAVENOUS

## 2013-01-10 MED ORDER — HYDROMORPHONE HCL PF 1 MG/ML IJ SOLN
0.2500 mg | INTRAMUSCULAR | Status: DC | PRN
  Administered 2013-01-10 (×2): 0.5 mg via INTRAVENOUS

## 2013-01-10 MED ORDER — MIDAZOLAM HCL 5 MG/5ML IJ SOLN
INTRAMUSCULAR | Status: DC | PRN
  Administered 2013-01-10: 2 mg via INTRAVENOUS

## 2013-01-10 SURGICAL SUPPLY — 1 items: CLOTH BEACON ORANGE TIMEOUT ST (SAFETY) ×2 IMPLANT

## 2013-01-10 NOTE — H&P (Signed)
[name] is an 47 y.o. female.   Chief Complaint:stiff rt knee HPI: TKR 3 mos ago with motion plateaued at -4  To 90 degrees despite aggressive PT  Past Medical History  Diagnosis Date  . Chest pain     Nuclear, November, 2012, no ischemia, normal ejection fraction.  . Tachycardia   . Anxiety   . Dyslipidemia   . Hypothyroidism   . Depression   . Fibromyalgia   . Dizziness     RESOLVED  . Fibromyalgia muscle pain   . Ejection fraction     EF 60%, echo, 05/2011  . Bipolar 1 disorder   . Hypertension   . Shortness of breath     when walks long distances   . Sleep apnea     cpap setting at 4   . Incontinence   . Headache(784.0)     occasional   . Arthritis   . GERD (gastroesophageal reflux disease)     OCCAS - DIET CONTROLLED    Past Surgical History  Procedure Laterality Date  . Wisdom tooth extraction    . Carpal tunnel release      BILATERAL  . Cholecystectomy    . Gallbladder surgery    . Total knee arthroplasty Right 09/17/2012    Procedure: TOTAL KNEE ARTHROPLASTY;  Surgeon: Drucilla Schmidt, MD;  Location: [facility];  Service: Orthopedics;  Laterality: Right;    Family History  Problem Relation Age of Onset  . Adopted: Yes  . Stroke    . Depression Mother    Social History:  reports that she has never smoked. She has never used smokeless tobacco. She reports that she does not drink alcohol or use illicit drugs.  Allergies:  Allergies  Allergen Reactions  . Prednisolone Hives    Can tolerate methylprednisone  . Prednisone Hives  . Vicodin (Hydrocodone-Acetaminophen) Other (See Comments)    Felt very hot    Medications Prior to Admission  Medication Sig Dispense Refill  . ARIPiprazole (ABILIFY) 5 MG tablet Take 5 mg by mouth every morning.       . cyclobenzaprine (FLEXERIL) 10 MG tablet Take 10 mg by mouth 3 (three) times daily as needed. For muscle spasms      . DULoxetine (CYMBALTA) 30 MG capsule Take 90 mg by mouth every morning.       .  fesoterodine (TOVIAZ) 8 MG TB24 Take 8 mg by mouth every morning.      . fish oil-omega-3 fatty acids 1000 MG capsule Take 1 g by mouth every morning.       . furosemide (LASIX) 20 MG tablet Take 40 mg by mouth every morning.       Marland Kitchen levothyroxine (SYNTHROID, LEVOTHROID) 150 MCG tablet Take 150 mcg by mouth every morning.       Marland Kitchen lisinopril (PRINIVIL,ZESTRIL) 20 MG tablet Take 20 mg by mouth every morning.       . loratadine (CLARITIN) 10 MG tablet Take 10 mg by mouth every morning.       Marland Kitchen MELATONIN PO Take 1 tablet by mouth at bedtime as needed (For sleep.).       Marland Kitchen OVER THE COUNTER MEDICATION Take 1 tablet by mouth every morning. Glucosamine Chondroitin 1500-1200mg       . HYDROcodone-acetaminophen (NORCO) 10-325 MG per tablet Take 1 tablet by mouth every 6 (six) hours as needed for pain.  30 tablet  0  . lidocaine (LIDODERM) 5 % Place 1-2 patches onto the skin daily  as needed (Applies to back for pain.). Remove & Discard patch within 12 hours or as directed by MD      . Multiple Vitamin (MULTIVITAMIN WITH MINERALS) TABS Take 1 tablet by mouth every morning.       . vitamin C (ASCORBIC ACID) 500 MG tablet Take 500 mg by mouth every morning.         Results for orders placed during the hospital encounter of 01/09/13 (from the past 48 hour(s))  SURGICAL PCR SCREEN     Status: None   Collection Time    01/09/13  1:17 PM      Result Value Range   MRSA, PCR NEGATIVE  NEGATIVE   Staphylococcus aureus NEGATIVE  NEGATIVE   Comment:            The Xpert SA Assay (FDA     approved for NASAL specimens     in patients over 51 years of age),     is one component of     a comprehensive surveillance     program.  Test performance has     been validated by [employer] for patients greater     than or equal to 63 year old.     It is not intended     to diagnose infection nor to     guide or monitor treatment.  CBC     Status: Abnormal   Collection Time    01/09/13  1:50 PM      Result Value  Range   WBC 7.3  4.0 - 10.5 K/uL   RBC 4.87  3.87 - 5.11 MIL/uL   Hemoglobin 12.9  12.0 - 15.0 g/dL   HCT 16.1  09.6 - 04.5 %   MCV 82.8  78.0 - 100.0 fL   MCH 26.5  26.0 - 34.0 pg   MCHC 32.0  30.0 - 36.0 g/dL   RDW 40.9 (*) 81.1 - 91.4 %   Platelets 229  150 - 400 K/uL  BASIC METABOLIC PANEL     Status: Abnormal   Collection Time    01/09/13  1:50 PM      Result Value Range   Sodium 135  135 - 145 mEq/L   Potassium 3.9  3.5 - 5.1 mEq/L   Chloride 97  96 - 112 mEq/L   CO2 27  19 - 32 mEq/L   Glucose, Bld 124 (*) 70 - 99 mg/dL   BUN 14  6 - 23 mg/dL   Creatinine, Ser 7.82  0.50 - 1.10 mg/dL   Calcium 9.5  8.4 - 95.6 mg/dL   GFR calc non Af Amer >90  >90 mL/min   GFR calc Af Amer >90  >90 mL/min   Comment:            The eGFR has been calculated     using the CKD EPI equation.     This calculation has not been     validated in all clinical     situations.     eGFR's persistently     <90 mL/min signify     possible Chronic Kidney Disease.  HCG, SERUM, QUALITATIVE     Status: None   Collection Time    01/09/13  1:50 PM      Result Value Range   Preg, Serum NEGATIVE  NEGATIVE   Comment:            THE SENSITIVITY OF THIS  METHODOLOGY IS >10 mIU/mL.   No results found.  ROS  Blood pressure 136/88, pulse 114, temperature 97.7 F (36.5 C), temperature source Oral, resp. rate 20, SpO2 99.00%. Physical Exam  Constitutional: She is oriented to person, place, and time.  obese  HENT:  Head: Normocephalic and atraumatic.  Right Ear: External ear normal.  Left Ear: External ear normal.  Nose: Nose normal.  Mouth/Throat: Oropharynx is clear and moist.  Eyes: Conjunctivae and EOM are normal. Pupils are equal, round, and reactive to light.  Neck: Normal range of motion. Neck supple.  Cardiovascular: Normal rate, regular rhythm, normal heart sounds and intact distal pulses.   Respiratory: Effort normal and breath sounds normal.  GI: Soft. Bowel sounds are normal.   Musculoskeletal:  Rt knee--motion -4 to 95 degrees  Neurological: She is alert and oriented to person, place, and time. She has normal reflexes.  Skin: Skin is warm and dry.  Psychiatric: She has a normal mood and affect. Her behavior is normal. Judgment and thought content normal.     Assessment/Plan Stiff rt knee post TKR Closed manipulation rt knee  APLINGTON,JAMES P 01/10/2013, 4:49 PM

## 2013-01-10 NOTE — Anesthesia Preprocedure Evaluation (Signed)
Anesthesia Evaluation  Patient identified by MRN, date of birth, ID band Patient awake    Reviewed: Allergy & Precautions, H&P , NPO status , Patient's Chart, lab work & pertinent test results  Airway Mallampati: III TM Distance: <3 FB Neck ROM: Full    Dental no notable dental hx. (+) Teeth Intact and Dental Advisory Given   Pulmonary neg pulmonary ROS, shortness of breath, sleep apnea and Continuous Positive Airway Pressure Ventilation ,  breath sounds clear to auscultation  + decreased breath sounds      Cardiovascular hypertension, Pt. on medications Rhythm:Regular Rate:Normal     Neuro/Psych  Headaches, Anxiety Depression negative neurological ROS     GI/Hepatic negative GI ROS, Neg liver ROS, GERD-  ,  Endo/Other  Hypothyroidism Morbid obesity  Renal/GU negative Renal ROS  negative genitourinary   Musculoskeletal negative musculoskeletal ROS (+) Fibromyalgia -  Abdominal   Peds  Hematology negative hematology ROS (+)   Anesthesia Other Findings   Reproductive/Obstetrics                           Anesthesia Physical Anesthesia Plan  ASA: III  Anesthesia Plan: General   Post-op Pain Management:    Induction: Intravenous  Airway Management Planned: Natural Airway  Additional Equipment:   Intra-op Plan:   Post-operative Plan:   Informed Consent: I have reviewed the patients History and Physical, chart, labs and discussed the procedure including the risks, benefits and alternatives for the proposed anesthesia with the patient or authorized representative who has indicated his/her understanding and acceptance.   Dental advisory given  Plan Discussed with: CRNA  Anesthesia Plan Comments:         Anesthesia Quick Evaluation

## 2013-01-10 NOTE — Transfer of Care (Signed)
Immediate Anesthesia Transfer of Care Note  Patient: [name]  Procedure(s) Performed: Procedure(s) (LRB): CLOSED MANIPULATION OF RIGHT KNEE (Right)  Patient Location: PACU  Anesthesia Type: General  Level of Consciousness: sedated, patient cooperative and responds to stimulaton  Airway & Oxygen Therapy: Patient Spontanous Breathing and Patient connected to face mask oxgen  Post-op Assessment: Report given to PACU RN and Post -op Vital signs reviewed and stable  Post vital signs: Reviewed and stable  Complications: No apparent anesthesia complications

## 2013-01-11 NOTE — Op Note (Signed)
NAMECARRISSA, [name]              ACCOUNT NO.:  [id number]  MEDICAL RECORD NO.:  [id number]  LOCATION:  [facility]                         FACILITY:  [facility]  PHYSICIAN:  Marlowe Kays, M.D.  DATE OF BIRTH:  11-06-1965  DATE OF PROCEDURE:  01/10/2013 DATE OF DISCHARGE:  01/10/2013                              OPERATIVE REPORT   PREOPERATIVE DIAGNOSIS:  Stiff right total knee replacement.  POSTOPERATIVE DIAGNOSIS:  Stiff right total knee replacement.  OPERATION:  Closed manipulation, right knee.  SURGEON:  Marlowe Kays, M.D.  ASSISTANT:  Nurse.  ANESTHESIA:  General.  PATHOLOGY AND JUSTIFICATION FOR PROCEDURE:  Original total knee replacement was done on September 17, 2012, roughly 3 months and 3 weeks ago. She was continuing to improve from physical therapy, but has plateaued out at this point.  I saw on her January 06, 2013, and at that time, she was happy with the knee because of pain control, but mainly complaining of fact that she was having difficulty putting on her shoe on her right foot.  Physical Therapy did not feel that any additional attempts there would be beneficial and consequently, she is here today for an attempt at closed manipulation, and even though, I have told her that at slight stage, we may not be able to make much improvement.  PROCEDURE:  Satisfactory general anesthesia.  Time-out was performed. First, checked her on leg extension, which appeared to be now that the popliteal area went flat to the examining table.  Her left knee actually went in slight hyperextension.  I then placed her right thigh over my arm and dropped the leg off the side of the gurney and began careful, but forceful traction occasionally going to extension, but gradually working back into as much flexion as I could obtained both with leg to the side and bending her leg while she was on her back and flexion her knee up against her chest.  It appeared to both knee and the circulating nurse  that we probably gained about 10 degrees of flexion in the process.  When her improvement had plateaued off after about 20 minutes of inflation time, I felt that possible improvement had leveled off and it did not feel that any further flexion would be beneficial.  She was then awakened and taken to the PACU in satisfactory condition with no known complications.         ______________________________ Marlowe Kays, M.D.    JA/MEDQ  D:  01/10/2013  T:  01/11/2013  Job:  [id number]

## 2013-01-12 NOTE — Anesthesia Postprocedure Evaluation (Signed)
Anesthesia Post Note  Patient: [name]  Procedure(s) Performed: Procedure(s) (LRB): CLOSED MANIPULATION OF RIGHT KNEE (Right)  Anesthesia type: General  Patient location: PACU  Post pain: Pain level controlled  Post assessment: Post-op Vital signs reviewed  Last Vitals:  Filed Vitals:   01/10/13 1844  BP: 144/64  Pulse: 82  Temp: 37.1 C  Resp: 18    Post vital signs: Reviewed  Level of consciousness: sedated  Complications: No apparent anesthesia complications

## 2013-01-13 ENCOUNTER — Encounter (HOSPITAL_COMMUNITY): Payer: Self-pay | Admitting: Orthopedic Surgery

## 2013-02-19 ENCOUNTER — Encounter (HOSPITAL_COMMUNITY): Payer: Self-pay | Admitting: Pharmacy Technician

## 2013-02-26 ENCOUNTER — Encounter (HOSPITAL_COMMUNITY)
Admission: RE | Admit: 2013-02-26 | Discharge: 2013-02-26 | Disposition: A | Discharge status: Home / Self Care | Payer: BC Managed Care – PPO | Source: Ambulatory Visit | Attending: Orthopedic Surgery | Admitting: Orthopedic Surgery

## 2013-02-26 ENCOUNTER — Encounter (HOSPITAL_COMMUNITY): Payer: Self-pay

## 2013-02-26 DIAGNOSIS — Z01812 Encounter for preprocedural laboratory examination: Secondary | ICD-10-CM | POA: Insufficient documentation

## 2013-02-26 LAB — COMPREHENSIVE METABOLIC PANEL
ALT: 77 U/L — ABNORMAL HIGH (ref 0–35)
AST: 49 U/L — ABNORMAL HIGH (ref 0–37)
Albumin: 3.4 g/dL — ABNORMAL LOW (ref 3.5–5.2)
Alkaline Phosphatase: 144 U/L — ABNORMAL HIGH (ref 39–117)
BUN: 17 mg/dL (ref 6–23)
CO2: 31 mEq/L (ref 19–32)
Calcium: 9.1 mg/dL (ref 8.4–10.5)
Chloride: 97 mEq/L (ref 96–112)
Creatinine, Ser: 0.74 mg/dL (ref 0.50–1.10)
GFR calc Af Amer: >90 mL/min (ref >90)
GFR calc non Af Amer: >90 mL/min (ref >90)
Glucose, Bld: 306 mg/dL — ABNORMAL HIGH (ref 70–99)
Potassium: 3.9 mEq/L (ref 3.5–5.1)
Sodium: 135 mEq/L (ref 135–145)
Total Bilirubin: 0.4 mg/dL (ref 0.3–1.2)
Total Protein: 7.1 g/dL (ref 6.0–8.3)

## 2013-02-26 LAB — URINALYSIS, ROUTINE W REFLEX MICROSCOPIC
Bilirubin Urine: NEGATIVE
Glucose, UA: 250 mg/dL — AB
Hgb urine dipstick: NEGATIVE
Ketones, ur: NEGATIVE mg/dL
Leukocytes, UA: NEGATIVE
Nitrite: NEGATIVE
Protein, ur: NEGATIVE mg/dL
Specific Gravity, Urine: 1.016 (ref 1.005–1.030)
Urobilinogen, UA: 0.2 mg/dL (ref 0.0–1.0)
pH: 5.5 (ref 5.0–8.0)

## 2013-02-26 LAB — PROTIME-INR
INR: 1.06 (ref 0.00–1.49)
Prothrombin Time: 13.6 seconds (ref 11.6–15.2)

## 2013-02-26 LAB — CBC
Hemoglobin: 13.4 g/dL (ref 12.0–15.0)
MCH: 28.2 pg (ref 26.0–34.0)
MCHC: 32.9 g/dL (ref 30.0–36.0)
RDW: 17.6 % — ABNORMAL HIGH (ref 11.5–15.5)

## 2013-02-26 LAB — APTT: aPTT: 36 seconds (ref 24–37)

## 2013-02-26 LAB — HCG, SERUM, QUALITATIVE: Preg, Serum: NEGATIVE

## 2013-02-26 NOTE — Progress Notes (Signed)
Chest x-ray 09/10/12 on EPIC, EKG 01/13/13 on EPIC

## 2013-02-26 NOTE — Patient Instructions (Addendum)
20 [name]  02/26/2013   Your procedure is scheduled on: 03/04/13  Report to [facility] at 11:30 AM.  Call this number if you have problems the morning of surgery [phone]   Remember: please bring CPAP mask   Do not eat food After Midnight, clear liquids from midnight until 8:00am on 03/04/13 then nothing.      Take these medicines the morning of surgery with A SIP OF WATER: abilify, cymbalta, synthroid, claritin   Do not wear jewelry, make-up or nail polish.  Do not wear lotions, powders, or perfumes. You may wear deodorant.  Do not shave 48 hours prior to surgery. Men may shave face and neck.  Do not bring valuables to the hospital.  Contacts, dentures or bridgework may not be worn into surgery.  Leave suitcase in the car. After surgery it may be brought to your room.  For patients admitted to the hospital, checkout time is 11:00 AM the day of discharge.    Please read over the following fact sheets that you were given: MRSA Information, incentive spirometry fact sheet, blood fact sheet, clear liquids fact sheet Birdie Sons, RN  pre op nurse call if needed [phone]    FAILURE TO FOLLOW THESE INSTRUCTIONS MAY RESULT IN CANCELLATION OF YOUR SURGERY   Patient Signature: ___________________________________________

## 2013-03-02 NOTE — H&P (Signed)
TOTAL KNEE ADMISSION H&P  Patient is being admitted for left total knee arthroplasty.  Subjective:  Chief Complaint:left knee pain.  HPI: [name], 47 y.o. female, has a history of pain and functional disability in the left knee due to arthritis and has failed non-surgical conservative treatments for greater than 12 weeks to includeNSAID's and/or analgesics, corticosteriod injections, viscosupplementation injections, weight reduction as appropriate and activity modification.  Onset of symptoms was gradual, starting 2 years ago with gradually worsening course since that time. The patient noted no past surgery on the left knee(s).  Patient currently rates pain in the left knee(s) at 7 out of 10 with activity. Patient has night pain, worsening of pain with activity and weight bearing, pain that interferes with activities of daily living, pain with passive range of motion, crepitus and joint swelling.  Patient has evidence of periarticular osteophytes and joint space narrowing by imaging studies. There is no active infection.  Patient Active Problem List   Diagnosis Date Noted  . S/P right TKA 09/21/2012  . Chest pain   . Ejection fraction   . Tachycardia   . Anxiety   . Dyslipidemia   . Hypothyroidism   . Depression   . Fibromyalgia   . Dizziness    Past Medical History  Diagnosis Date  . Chest pain     Nuclear, November, 2012, no ischemia, normal ejection fraction.  . Tachycardia   . Anxiety   . Dyslipidemia   . Hypothyroidism   . Depression   . Fibromyalgia   . Dizziness     RESOLVED  . Fibromyalgia muscle pain   . Ejection fraction     EF 60%, echo, 05/2011  . Bipolar 1 disorder   . Hypertension   . Sleep apnea     cpap setting at 4   . Incontinence   . Headache(784.0)     occasional   . Arthritis   . GERD (gastroesophageal reflux disease)     OCCAS - DIET CONTROLLED    Past Surgical History  Procedure Laterality Date  . Wisdom tooth extraction    . Carpal  tunnel release      BILATERAL  . Cholecystectomy    . Total knee arthroplasty Right 09/17/2012    Procedure: TOTAL KNEE ARTHROPLASTY;  Surgeon: Drucilla Schmidt, MD;  Location: [facility];  Service: Orthopedics;  Laterality: Right;  . Knee closed reduction Right 01/10/2013    Procedure: CLOSED MANIPULATION OF RIGHT KNEE;  Surgeon: Drucilla Schmidt, MD;  Location: [facility];  Service: Orthopedics;  Laterality: Right;  . Tonsillectomy     Current outpatient prescriptions: ARIPiprazole (ABILIFY) 5 MG tablet, Take 5 mg by mouth every morning. , Disp: , Rfl: ;  cyclobenzaprine (FLEXERIL) 10 MG tablet, Take 10 mg by mouth 3 (three) times daily as needed. For muscle spasms, Disp: , Rfl: ;  DULoxetine (CYMBALTA) 30 MG capsule, Take 90 mg by mouth every morning. , Disp: , Rfl: ;  fesoterodine (TOVIAZ) 8 MG TB24, Take 8 mg by mouth every morning., Disp: , Rfl:  fish oil-omega-3 fatty acids 1000 MG capsule, Take 1 g by mouth every morning. , Disp: , Rfl: ;  furosemide (LASIX) 20 MG tablet, Take 40 mg by mouth every morning. , Disp: , Rfl: ;  HYDROcodone-acetaminophen (NORCO) 7.5-325 MG per tablet, Take 1-3 tablets by mouth every 6 (six) hours as needed for pain., Disp: , Rfl: ;  levothyroxine (SYNTHROID, LEVOTHROID) 150 MCG tablet, Take 150 mcg by mouth every  morning. , Disp: , Rfl:  lidocaine (LIDODERM) 5 %, Place 1-2 patches onto the skin daily as needed (Applies to back for pain.). Remove & Discard patch within 12 hours or as directed by MD, Disp: , Rfl: ;  lisinopril (PRINIVIL,ZESTRIL) 20 MG tablet, Take 20 mg by mouth every morning. , Disp: , Rfl: ;  loratadine (CLARITIN) 10 MG tablet, Take 10 mg by mouth every morning. , Disp: , Rfl: ;  MELATONIN PO, Take 1 tablet by mouth at bedtime as needed (For sleep.). , Disp: , Rfl:  Multiple Vitamin (MULTIVITAMIN WITH MINERALS) TABS, Take 1 tablet by mouth every morning. , Disp: , Rfl: ;  OVER THE COUNTER MEDICATION, Take 1 tablet by mouth every morning. Glucosamine  Chondroitin 1500-1200mg , Disp: , Rfl:   Allergies  Allergen Reactions  . Prednisolone Hives    Can tolerate methylprednisone  . Prednisone Hives  . Vicodin [Hydrocodone-Acetaminophen] Other (See Comments)    Felt very hot    History  Substance Use Topics  . Smoking status: Never Smoker   . Smokeless tobacco: Never Used  . Alcohol Use: No     Comment: nothing to drink in 3 years     Family History  Problem Relation Age of Onset  . Adopted: Yes  . Stroke    . Depression Mother      Review of Systems  Constitutional: Positive for malaise/fatigue. Negative for fever, chills, weight loss and diaphoresis.  HENT: Negative.  Negative for neck pain.   Eyes: Positive for blurred vision. Negative for double vision, photophobia, pain, discharge and redness.  Respiratory: Positive for cough. Negative for hemoptysis, sputum production, shortness of breath and wheezing.   Cardiovascular: Negative.   Gastrointestinal: Positive for heartburn and constipation. Negative for nausea, vomiting, abdominal pain, diarrhea, blood in stool and melena.  Genitourinary: Negative.   Musculoskeletal: Positive for myalgias, back pain and joint pain. Negative for falls.  Skin: Negative.   Neurological: Positive for dizziness. Negative for tingling, tremors, sensory change, speech change, focal weakness, seizures, loss of consciousness and weakness.  Psychiatric/Behavioral: Negative for depression, suicidal ideas, hallucinations, memory loss and substance abuse. The patient has insomnia. The patient is not nervous/anxious.     Objective:  Physical Exam  Constitutional: She is oriented to person, place, and time. She appears well-developed. No distress.  Patient is morbidly obese  HENT:  Head: Normocephalic and atraumatic.  Right Ear: External ear normal.  Left Ear: External ear normal.  Nose: Nose normal.  Mouth/Throat: Oropharynx is clear and moist.  Eyes: Conjunctivae and EOM are normal.  Neck:  Normal range of motion. Neck supple.  Cardiovascular: Normal rate, regular rhythm, normal heart sounds and intact distal pulses.   No murmur heard. Respiratory: Effort normal and breath sounds normal. No respiratory distress. She has no wheezes.  GI: Soft. Bowel sounds are normal. She exhibits no distension and no mass. There is no tenderness.  Musculoskeletal:       Right hip: Normal.       Left hip: Normal.       Right knee: She exhibits decreased range of motion. She exhibits no swelling, no effusion and no erythema. No tenderness found.       Left knee: She exhibits decreased range of motion and swelling. She exhibits no effusion and no erythema. Tenderness found. Medial joint line tenderness noted. No lateral joint line tenderness noted.       Right lower leg: She exhibits no tenderness and no swelling.  Left lower leg: She exhibits no tenderness and no swelling.  Lymphadenopathy:    She has no cervical adenopathy.  Neurological: She is alert and oriented to person, place, and time. She has normal strength and normal reflexes. No sensory deficit.  Skin: No rash noted. She is not diaphoretic. No erythema.  Psychiatric: She has a normal mood and affect. Her behavior is normal.    Vitals  HR: 76 bpm BP: 127/82 (Sitting, Left Arm, Standard)  Estimated body mass index is 54.93 kg/(m^2) as calculated from the following:   Height as of 01/09/13: 5\' 3"  (1.6 m).   Weight as of 01/09/13: 140.615 kg (310 lb).   Imaging Review Plain radiographs demonstrate severe degenerative joint disease of the left knee(s). The overall alignment ismild varus. The bone quality appears to be good for age and reported activity level.  Assessment/Plan:  End stage arthritis, left knee   The patient history, physical examination, clinical judgment of the provider and imaging studies are consistent with end stage degenerative joint disease of the left knee(s) and total knee arthroplasty is deemed  medically necessary. The treatment options including medical management, injection therapy arthroscopy and arthroplasty were discussed at length. The risks and benefits of total knee arthroplasty were presented and reviewed. The risks due to aseptic loosening, infection, stiffness, patella tracking problems, thromboembolic complications and other imponderables were discussed. The patient acknowledged the explanation, agreed to proceed with the plan and consent was signed. Patient is being admitted for inpatient treatment for surgery, pain control, PT, OT, prophylactic antibiotics, VTE prophylaxis, progressive ambulation and ADL's and discharge planning. The patient is planning to be discharged home with home health services  [place], [place]

## 2013-03-04 ENCOUNTER — Encounter (HOSPITAL_COMMUNITY): Payer: Self-pay | Admitting: *Deleted

## 2013-03-04 ENCOUNTER — Encounter (HOSPITAL_COMMUNITY)
Admission: RE | Disposition: A | Discharge status: Home Health | Payer: Self-pay | Source: Ambulatory Visit | Attending: Orthopedic Surgery

## 2013-03-04 ENCOUNTER — Inpatient Hospital Stay (HOSPITAL_COMMUNITY): Payer: BC Managed Care – PPO | Admitting: Anesthesiology

## 2013-03-04 ENCOUNTER — Encounter (HOSPITAL_COMMUNITY): Payer: Self-pay | Admitting: Anesthesiology

## 2013-03-04 ENCOUNTER — Inpatient Hospital Stay (HOSPITAL_COMMUNITY): Payer: BC Managed Care – PPO

## 2013-03-04 ENCOUNTER — Inpatient Hospital Stay (HOSPITAL_COMMUNITY)
Admission: RE | Admit: 2013-03-04 | Discharge: 2013-03-07 | DRG: 209 | Disposition: A | Discharge status: Home Health | Payer: BC Managed Care – PPO | Source: Ambulatory Visit | Attending: Orthopedic Surgery | Admitting: Orthopedic Surgery

## 2013-03-04 DIAGNOSIS — K219 Gastro-esophageal reflux disease without esophagitis: Secondary | ICD-10-CM | POA: Diagnosis present

## 2013-03-04 DIAGNOSIS — M1712 Unilateral primary osteoarthritis, left knee: Secondary | ICD-10-CM

## 2013-03-04 DIAGNOSIS — E039 Hypothyroidism, unspecified: Secondary | ICD-10-CM | POA: Diagnosis present

## 2013-03-04 DIAGNOSIS — Z6841 Body Mass Index (BMI) 40.0 and over, adult: Secondary | ICD-10-CM

## 2013-03-04 DIAGNOSIS — F411 Generalized anxiety disorder: Secondary | ICD-10-CM | POA: Diagnosis present

## 2013-03-04 DIAGNOSIS — E785 Hyperlipidemia, unspecified: Secondary | ICD-10-CM | POA: Diagnosis present

## 2013-03-04 DIAGNOSIS — IMO0001 Reserved for inherently not codable concepts without codable children: Secondary | ICD-10-CM | POA: Diagnosis present

## 2013-03-04 DIAGNOSIS — M24569 Contracture, unspecified knee: Secondary | ICD-10-CM | POA: Diagnosis present

## 2013-03-04 DIAGNOSIS — M1710 Unilateral primary osteoarthritis, unspecified knee: Principal | ICD-10-CM | POA: Diagnosis present

## 2013-03-04 DIAGNOSIS — I10 Essential (primary) hypertension: Secondary | ICD-10-CM | POA: Diagnosis present

## 2013-03-04 DIAGNOSIS — Z96659 Presence of unspecified artificial knee joint: Secondary | ICD-10-CM

## 2013-03-04 DIAGNOSIS — Z79899 Other long term (current) drug therapy: Secondary | ICD-10-CM

## 2013-03-04 DIAGNOSIS — D62 Acute posthemorrhagic anemia: Secondary | ICD-10-CM | POA: Diagnosis present

## 2013-03-04 DIAGNOSIS — G4730 Sleep apnea, unspecified: Secondary | ICD-10-CM | POA: Diagnosis present

## 2013-03-04 HISTORY — PX: TOTAL KNEE ARTHROPLASTY: SHX125

## 2013-03-04 LAB — TYPE AND SCREEN
ABO/RH(D): O NEG
Antibody Screen: NEGATIVE

## 2013-03-04 SURGERY — ARTHROPLASTY, KNEE, TOTAL
Anesthesia: General | Site: Knee | Laterality: Left | Wound class: Clean

## 2013-03-04 MED ORDER — THROMBIN 5000 UNITS EX SOLR
CUTANEOUS | Status: DC | PRN
  Administered 2013-03-04: 5000 [IU] via TOPICAL

## 2013-03-04 MED ORDER — FUROSEMIDE 40 MG PO TABS
40.0000 mg | ORAL_TABLET | Freq: Every morning | ORAL | Status: DC
  Administered 2013-03-05 – 2013-03-07 (×3): 40 mg via ORAL
  Filled 2013-03-04 (×3): qty 1

## 2013-03-04 MED ORDER — BUPIVACAINE LIPOSOME 1.3 % IJ SUSP
INTRAMUSCULAR | Status: DC | PRN
  Administered 2013-03-04: 20 mL

## 2013-03-04 MED ORDER — ONDANSETRON HCL 4 MG PO TABS: 4.0000 mg | ORAL_TABLET | Freq: Four times a day (QID) | ORAL | Status: DC | PRN

## 2013-03-04 MED ORDER — CEFAZOLIN SODIUM 1-5 GM-% IV SOLN
1.0000 g | Freq: Four times a day (QID) | INTRAVENOUS | Status: AC
  Administered 2013-03-04 – 2013-03-05 (×2): 1 g via INTRAVENOUS
  Filled 2013-03-04 (×2): qty 50

## 2013-03-04 MED ORDER — NEOSTIGMINE METHYLSULFATE 1 MG/ML IJ SOLN
INTRAMUSCULAR | Status: DC | PRN
  Administered 2013-03-04: 2 mg via INTRAVENOUS

## 2013-03-04 MED ORDER — FENTANYL CITRATE 0.05 MG/ML IJ SOLN
INTRAMUSCULAR | Status: DC | PRN
  Administered 2013-03-04: 150 ug via INTRAVENOUS
  Administered 2013-03-04: 50 ug via INTRAVENOUS
  Administered 2013-03-04 (×3): 100 ug via INTRAVENOUS

## 2013-03-04 MED ORDER — MIDAZOLAM HCL 5 MG/5ML IJ SOLN
INTRAMUSCULAR | Status: DC | PRN
  Administered 2013-03-04: 2 mg via INTRAVENOUS

## 2013-03-04 MED ORDER — ACETAMINOPHEN 650 MG RE SUPP: 650.0000 mg | Freq: Four times a day (QID) | RECTAL | Status: DC | PRN

## 2013-03-04 MED ORDER — CELECOXIB 200 MG PO CAPS
200.0000 mg | ORAL_CAPSULE | Freq: Two times a day (BID) | ORAL | Status: DC
  Administered 2013-03-04 – 2013-03-07 (×6): 200 mg via ORAL
  Filled 2013-03-04 (×7): qty 1

## 2013-03-04 MED ORDER — ONDANSETRON HCL 4 MG/2ML IJ SOLN
INTRAMUSCULAR | Status: DC | PRN
  Administered 2013-03-04: 4 mg via INTRAVENOUS

## 2013-03-04 MED ORDER — SODIUM CHLORIDE 0.9 % IJ SOLN
INTRAMUSCULAR | Status: DC | PRN
  Administered 2013-03-04: 20 mL via INTRAVENOUS

## 2013-03-04 MED ORDER — FESOTERODINE FUMARATE ER 8 MG PO TB24
8.0000 mg | ORAL_TABLET | Freq: Every morning | ORAL | Status: DC
  Administered 2013-03-05 – 2013-03-07 (×3): 8 mg via ORAL
  Filled 2013-03-04 (×4): qty 1

## 2013-03-04 MED ORDER — SODIUM CHLORIDE 0.9 % IR SOLN
Status: DC | PRN
  Administered 2013-03-04: 3000 mL

## 2013-03-04 MED ORDER — ARIPIPRAZOLE 5 MG PO TABS
5.0000 mg | ORAL_TABLET | Freq: Every morning | ORAL | Status: DC
  Administered 2013-03-05 – 2013-03-07 (×3): 5 mg via ORAL
  Filled 2013-03-04 (×3): qty 1

## 2013-03-04 MED ORDER — RIVAROXABAN 10 MG PO TABS
10.0000 mg | ORAL_TABLET | Freq: Every day | ORAL | Status: DC
  Administered 2013-03-05 – 2013-03-07 (×3): 10 mg via ORAL
  Filled 2013-03-04 (×4): qty 1

## 2013-03-04 MED ORDER — DEXTROSE 5 % IV SOLN
3.0000 g | INTRAVENOUS | Status: AC
  Administered 2013-03-04: 3 g via INTRAVENOUS
  Filled 2013-03-04: qty 3000

## 2013-03-04 MED ORDER — LEVOTHYROXINE SODIUM 150 MCG PO TABS
150.0000 ug | ORAL_TABLET | Freq: Every day | ORAL | Status: DC
  Administered 2013-03-05 – 2013-03-07 (×3): 150 ug via ORAL
  Filled 2013-03-04 (×4): qty 1

## 2013-03-04 MED ORDER — SODIUM CHLORIDE 0.9 % IR SOLN
Status: DC | PRN
  Administered 2013-03-04: 16:00:00

## 2013-03-04 MED ORDER — GLYCOPYRROLATE 0.2 MG/ML IJ SOLN
INTRAMUSCULAR | Status: DC | PRN
  Administered 2013-03-04: 0.3 mg via INTRAVENOUS

## 2013-03-04 MED ORDER — LACTATED RINGERS IV SOLN
INTRAVENOUS | Status: DC
  Administered 2013-03-04: 21:00:00 via INTRAVENOUS

## 2013-03-04 MED ORDER — LIDOCAINE HCL (CARDIAC) 20 MG/ML IV SOLN
INTRAVENOUS | Status: DC | PRN
  Administered 2013-03-04: 60 mg via INTRAVENOUS

## 2013-03-04 MED ORDER — PHENOL 1.4 % MT LIQD: 1.0000 | OROMUCOSAL | Status: DC | PRN

## 2013-03-04 MED ORDER — HYDROMORPHONE HCL 2 MG PO TABS
2.0000 mg | ORAL_TABLET | ORAL | Status: DC | PRN
  Administered 2013-03-04 – 2013-03-06 (×10): 2 mg via ORAL
  Filled 2013-03-04 (×11): qty 1

## 2013-03-04 MED ORDER — METHOCARBAMOL 500 MG PO TABS
500.0000 mg | ORAL_TABLET | Freq: Four times a day (QID) | ORAL | Status: DC | PRN
  Administered 2013-03-04 – 2013-03-07 (×10): 500 mg via ORAL
  Filled 2013-03-04 (×10): qty 1

## 2013-03-04 MED ORDER — HYDROMORPHONE HCL PF 1 MG/ML IJ SOLN
0.2500 mg | INTRAMUSCULAR | Status: DC | PRN
  Administered 2013-03-04 (×4): 0.5 mg via INTRAVENOUS

## 2013-03-04 MED ORDER — DULOXETINE HCL 60 MG PO CPEP
90.0000 mg | ORAL_CAPSULE | Freq: Every morning | ORAL | Status: DC
  Administered 2013-03-05 – 2013-03-07 (×3): 90 mg via ORAL
  Filled 2013-03-04 (×3): qty 1

## 2013-03-04 MED ORDER — BISACODYL 10 MG RE SUPP: 10.0000 mg | Freq: Every day | RECTAL | Status: DC | PRN

## 2013-03-04 MED ORDER — METHOCARBAMOL 100 MG/ML IJ SOLN
500.0000 mg | Freq: Four times a day (QID) | INTRAVENOUS | Status: DC | PRN
  Filled 2013-03-04: qty 5

## 2013-03-04 MED ORDER — HYDROMORPHONE HCL PF 1 MG/ML IJ SOLN
INTRAMUSCULAR | Status: DC | PRN
  Administered 2013-03-04: 0.5 mg via INTRAVENOUS
  Administered 2013-03-04: 2 mg via INTRAVENOUS
  Administered 2013-03-04: 0.5 mg via INTRAVENOUS

## 2013-03-04 MED ORDER — FLEET ENEMA 7-19 GM/118ML RE ENEM: 1.0000 | ENEMA | Freq: Once | RECTAL | Status: AC | PRN

## 2013-03-04 MED ORDER — ONDANSETRON HCL 4 MG/2ML IJ SOLN: 4.0000 mg | Freq: Four times a day (QID) | INTRAMUSCULAR | Status: DC | PRN

## 2013-03-04 MED ORDER — SUCCINYLCHOLINE CHLORIDE 20 MG/ML IJ SOLN
INTRAMUSCULAR | Status: DC | PRN
  Administered 2013-03-04: 200 mg via INTRAVENOUS

## 2013-03-04 MED ORDER — ALUM & MAG HYDROXIDE-SIMETH 200-200-20 MG/5ML PO SUSP: 30.0000 mL | ORAL | Status: DC | PRN

## 2013-03-04 MED ORDER — CISATRACURIUM BESYLATE (PF) 10 MG/5ML IV SOLN
INTRAVENOUS | Status: DC | PRN
  Administered 2013-03-04: 6 mg via INTRAVENOUS

## 2013-03-04 MED ORDER — ACETAMINOPHEN 325 MG PO TABS: 650.0000 mg | ORAL_TABLET | Freq: Four times a day (QID) | ORAL | Status: DC | PRN

## 2013-03-04 MED ORDER — PROPOFOL 10 MG/ML IV BOLUS
INTRAVENOUS | Status: DC | PRN
  Administered 2013-03-04: 200 mg via INTRAVENOUS

## 2013-03-04 MED ORDER — HYDRALAZINE HCL 20 MG/ML IJ SOLN
INTRAMUSCULAR | Status: DC | PRN
  Administered 2013-03-04 (×2): 4 mg via INTRAVENOUS

## 2013-03-04 MED ORDER — POLYETHYLENE GLYCOL 3350 17 G PO PACK: 17.0000 g | PACK | Freq: Every day | ORAL | Status: DC | PRN

## 2013-03-04 MED ORDER — LISINOPRIL 20 MG PO TABS
20.0000 mg | ORAL_TABLET | Freq: Every morning | ORAL | Status: DC
  Administered 2013-03-05 – 2013-03-07 (×3): 20 mg via ORAL
  Filled 2013-03-04 (×3): qty 1

## 2013-03-04 MED ORDER — HYDROMORPHONE HCL PF 1 MG/ML IJ SOLN
1.0000 mg | INTRAMUSCULAR | Status: DC | PRN
  Administered 2013-03-04 – 2013-03-05 (×5): 1 mg via INTRAVENOUS
  Filled 2013-03-04 (×5): qty 1

## 2013-03-04 MED ORDER — PROMETHAZINE HCL 25 MG/ML IJ SOLN: 6.2500 mg | INTRAMUSCULAR | Status: DC | PRN

## 2013-03-04 MED ORDER — BUPIVACAINE LIPOSOME 1.3 % IJ SUSP
20.0000 mL | Freq: Once | INTRAMUSCULAR | Status: DC
  Filled 2013-03-04: qty 20

## 2013-03-04 MED ORDER — FERROUS SULFATE 325 (65 FE) MG PO TABS
325.0000 mg | ORAL_TABLET | Freq: Three times a day (TID) | ORAL | Status: DC
  Administered 2013-03-05 – 2013-03-07 (×6): 325 mg via ORAL
  Filled 2013-03-04 (×10): qty 1

## 2013-03-04 MED ORDER — LACTATED RINGERS IV SOLN
INTRAVENOUS | Status: DC
  Administered 2013-03-04 (×2): via INTRAVENOUS

## 2013-03-04 MED ORDER — MENTHOL 3 MG MT LOZG: 1.0000 | LOZENGE | OROMUCOSAL | Status: DC | PRN

## 2013-03-04 SURGICAL SUPPLY — 74 items
ADH SKN CLS APL DERMABOND .7 (GAUZE/BANDAGES/DRESSINGS) ×1
BAG SPEC THK2 15X12 ZIP CLS (MISCELLANEOUS) ×1
BAG ZIPLOCK 12X15 (MISCELLANEOUS) ×2 IMPLANT
BANDAGE ELASTIC 4 VELCRO ST LF (GAUZE/BANDAGES/DRESSINGS) ×2 IMPLANT
BANDAGE ELASTIC 6 VELCRO ST LF (GAUZE/BANDAGES/DRESSINGS) ×2 IMPLANT
BANDAGE ESMARK 6X9 LF (GAUZE/BANDAGES/DRESSINGS) ×1 IMPLANT
BLADE SAG 18X100X1.27 (BLADE) ×2 IMPLANT
BLADE SAW SGTL 11.0X1.19X90.0M (BLADE) ×2 IMPLANT
BNDG CMPR 9X6 STRL LF SNTH (GAUZE/BANDAGES/DRESSINGS) ×1
BNDG CMPR MED 15X6 ELC VLCR LF (GAUZE/BANDAGES/DRESSINGS) ×1
BNDG COHESIVE 4X5 TAN NS LF (GAUZE/BANDAGES/DRESSINGS) ×1 IMPLANT
BNDG ELASTIC 6X15 VLCR STRL LF (GAUZE/BANDAGES/DRESSINGS) ×1 IMPLANT
BNDG ESMARK 6X9 LF (GAUZE/BANDAGES/DRESSINGS) ×2
BONE CEMENT GENTAMICIN (Cement) ×4 IMPLANT
CAP UPCHARGE REVISION TRAY ×1 IMPLANT
CAPT RP KNEE ×1 IMPLANT
CEMENT BONE GENTAMICIN 40 (Cement) ×2 IMPLANT
CLOTH BEACON ORANGE TIMEOUT ST (SAFETY) ×2 IMPLANT
CUFF TOURN SGL QUICK 34 (TOURNIQUET CUFF)
CUFF TOURN SGL QUICK 44 (TOURNIQUET CUFF) ×1 IMPLANT
CUFF TRNQT CYL 34X4X40X1 (TOURNIQUET CUFF) ×1 IMPLANT
DERMABOND ADVANCED (GAUZE/BANDAGES/DRESSINGS) ×1
DERMABOND ADVANCED .7 DNX12 (GAUZE/BANDAGES/DRESSINGS) ×1 IMPLANT
DRAPE EXTREMITY T 121X128X90 (DRAPE) ×2 IMPLANT
DRAPE INCISE IOBAN 66X45 STRL (DRAPES) ×2 IMPLANT
DRAPE LG THREE QUARTER DISP (DRAPES) ×2 IMPLANT
DRAPE POUCH INSTRU U-SHP 10X18 (DRAPES) ×2 IMPLANT
DRAPE U-SHAPE 47X51 STRL (DRAPES) ×2 IMPLANT
DRSG AQUACEL AG ADV 3.5X10 (GAUZE/BANDAGES/DRESSINGS) ×2 IMPLANT
DRSG AQUACEL AG ADV 3.5X14 (GAUZE/BANDAGES/DRESSINGS) ×1 IMPLANT
DRSG PAD ABDOMINAL 8X10 ST (GAUZE/BANDAGES/DRESSINGS) ×4 IMPLANT
DRSG TEGADERM 4X4.75 (GAUZE/BANDAGES/DRESSINGS) ×2 IMPLANT
DURAPREP 26ML APPLICATOR (WOUND CARE) ×3 IMPLANT
ELECT BLADE TIP CTD 4 INCH (ELECTRODE) ×1 IMPLANT
ELECT REM PT RETURN 9FT ADLT (ELECTROSURGICAL) ×2
ELECTRODE REM PT RTRN 9FT ADLT (ELECTROSURGICAL) ×1 IMPLANT
EVACUATOR 1/8 PVC DRAIN (DRAIN) ×2 IMPLANT
FACESHIELD LNG OPTICON STERILE (SAFETY) ×10 IMPLANT
GAUZE SPONGE 2X2 8PLY STRL LF (GAUZE/BANDAGES/DRESSINGS) ×1 IMPLANT
GLOVE BIOGEL PI IND STRL 8 (GLOVE) ×1 IMPLANT
GLOVE BIOGEL PI INDICATOR 8 (GLOVE) ×1
GLOVE ECLIPSE 8.0 STRL XLNG CF (GLOVE) ×4 IMPLANT
GLOVE SURG SS PI 6.5 STRL IVOR (GLOVE) ×4 IMPLANT
GOWN PREVENTION PLUS LG XLONG (DISPOSABLE) ×6 IMPLANT
GOWN STRL REIN XL XLG (GOWN DISPOSABLE) ×4 IMPLANT
HANDPIECE INTERPULSE COAX TIP (DISPOSABLE) ×2
IMMOBILIZER KNEE 20 (SOFTGOODS) ×2
IMMOBILIZER KNEE 20 THIGH 36 (SOFTGOODS) IMPLANT
KIT BASIN OR (CUSTOM PROCEDURE TRAY) ×2 IMPLANT
MANIFOLD NEPTUNE II (INSTRUMENTS) ×2 IMPLANT
NEEDLE HYPO 22GX1.5 SAFETY (NEEDLE) ×2 IMPLANT
NS IRRIG 1000ML POUR BTL (IV SOLUTION) ×2 IMPLANT
PACK TOTAL JOINT (CUSTOM PROCEDURE TRAY) ×2 IMPLANT
PADDING CAST COTTON 6X4 STRL (CAST SUPPLIES) ×1 IMPLANT
POSITIONER SURGICAL ARM (MISCELLANEOUS) ×2 IMPLANT
SET HNDPC FAN SPRY TIP SCT (DISPOSABLE) ×1 IMPLANT
SPONGE GAUZE 2X2 STER 10/PKG (GAUZE/BANDAGES/DRESSINGS) ×1
SPONGE LAP 18X18 X RAY DECT (DISPOSABLE) ×1 IMPLANT
SPONGE SURGIFOAM ABS GEL 100 (HEMOSTASIS) ×2 IMPLANT
STAPLER VISISTAT 35W (STAPLE) ×1 IMPLANT
SUT BONE WAX W31G (SUTURE) ×2 IMPLANT
SUT MNCRL AB 4-0 PS2 18 (SUTURE) ×2 IMPLANT
SUT VIC AB 1 CT1 27 (SUTURE) ×4
SUT VIC AB 1 CT1 27XBRD ANTBC (SUTURE) ×2 IMPLANT
SUT VIC AB 2-0 CT1 27 (SUTURE) ×4
SUT VIC AB 2-0 CT1 TAPERPNT 27 (SUTURE) ×2 IMPLANT
SUT VLOC 180 0 24IN GS25 (SUTURE) ×2 IMPLANT
SYR 20CC LL (SYRINGE) ×2 IMPLANT
TOWEL OR 17X26 10 PK STRL BLUE (TOWEL DISPOSABLE) ×4 IMPLANT
TOWER CARTRIDGE SMART MIX (DISPOSABLE) ×2 IMPLANT
TRAY FOLEY CATH 14FRSI W/METER (CATHETERS) ×2 IMPLANT
TRAY REVISION SZ 2.5 (Knees) ×1 IMPLANT
WATER STERILE IRR 1500ML POUR (IV SOLUTION) ×2 IMPLANT
WRAP KNEE MAXI GEL POST OP (GAUZE/BANDAGES/DRESSINGS) ×2 IMPLANT

## 2013-03-04 NOTE — Anesthesia Postprocedure Evaluation (Signed)
  Anesthesia Post-op Note  Patient: [name]  Procedure(s) Performed: Procedure(s) (LRB): LEFT TOTAL KNEE ARTHROPLASTY (Left)  Patient Location: PACU  Anesthesia Type: General  Level of Consciousness: awake and alert   Airway and Oxygen Therapy: Patient Spontanous Breathing  Post-op Pain: mild  Post-op Assessment: Post-op Vital signs reviewed, Patient's Cardiovascular Status Stable, Respiratory Function Stable, Patent Airway and No signs of Nausea or vomiting  Last Vitals:  Filed Vitals:   03/04/13 1745  BP: 137/76  Pulse: 108  Temp:   Resp: 22    Post-op Vital Signs: stable   Complications: No apparent anesthesia complications

## 2013-03-04 NOTE — Interval H&P Note (Signed)
History and Physical Interval Note:  03/04/2013 2:32 PM  [name]  has presented today for surgery, with the diagnosis of Osteoarthritis of the Left Knee  The various methods of treatment have been discussed with the patient and family. After consideration of risks, benefits and other options for treatment, the patient has consented to  Procedure(s): LEFT TOTAL KNEE ARTHROPLASTY (Left) as a surgical intervention .  The patient's history has been reviewed, patient examined, no change in status, stable for surgery.  I have reviewed the patient's chart and labs.  Questions were answered to the patient's satisfaction.     Ezrah Dembeck A

## 2013-03-04 NOTE — Preoperative (Signed)
Beta Blockers   Reason not to administer Beta Blockers:Not Applicable 

## 2013-03-04 NOTE — Progress Notes (Signed)
[facility]  [facility] is providing the following services: patient received CPAP 12/08/2011.    If patient discharges after hours, please call [phone].   [name] 03/04/2013, 9:32 PM

## 2013-03-04 NOTE — Transfer of Care (Signed)
Immediate Anesthesia Transfer of Care Note  Patient: [name]  Procedure(s) Performed: Procedure(s): LEFT TOTAL KNEE ARTHROPLASTY (Left)  Patient Location: PACU  Anesthesia Type:General  Level of Consciousness: awake, alert , oriented and patient cooperative  Airway & Oxygen Therapy: Patient Spontanous Breathing and Patient connected to face mask oxygen  Post-op Assessment: Report given to PACU RN and Post -op Vital signs reviewed and stable  Post vital signs: Reviewed and stable  Complications: No apparent anesthesia complications

## 2013-03-04 NOTE — Progress Notes (Signed)
RT placed patient on CPAP. Home settings are 4 cmH2O, but our CPAP only goes to 5 cmH2O and patient is aware. Patient brought own mask from home. Patient seems to be tolerating well.

## 2013-03-04 NOTE — Anesthesia Preprocedure Evaluation (Addendum)
Anesthesia Evaluation  Patient identified by MRN, date of birth, ID band Patient awake    Reviewed: Allergy & Precautions, H&P , NPO status , Patient's Chart, lab work & pertinent test results  Airway Mallampati: III TM Distance: <3 FB Neck ROM: Full    Dental no notable dental hx.    Pulmonary sleep apnea and Continuous Positive Airway Pressure Ventilation ,  breath sounds clear to auscultation  Pulmonary exam normal       Cardiovascular hypertension, Pt. on medications Rhythm:Regular Rate:Normal     Neuro/Psych negative neurological ROS  negative psych ROS   GI/Hepatic negative GI ROS, Neg liver ROS,   Endo/Other  Hypothyroidism Morbid obesity  Renal/GU negative Renal ROS  negative genitourinary   Musculoskeletal negative musculoskeletal ROS (+)   Abdominal   Peds negative pediatric ROS (+)  Hematology negative hematology ROS (+)   Anesthesia Other Findings   Reproductive/Obstetrics negative OB ROS                           Anesthesia Physical Anesthesia Plan  ASA: III  Anesthesia Plan: General   Post-op Pain Management:    Induction: Intravenous  Airway Management Planned: Oral ETT  Additional Equipment:   Intra-op Plan:   Post-operative Plan: Extubation in OR  Informed Consent: I have reviewed the patients History and Physical, chart, labs and discussed the procedure including the risks, benefits and alternatives for the proposed anesthesia with the patient or authorized representative who has indicated his/her understanding and acceptance.   Dental advisory given  Plan Discussed with: CRNA and Surgeon  Anesthesia Plan Comments:         Anesthesia Quick Evaluation

## 2013-03-04 NOTE — Brief Op Note (Signed)
03/04/2013  4:43 PM  PATIENT:  [name]  47 y.o. female  PRE-OPERATIVE DIAGNOSIS:  Osteoarthritis of the Left Knee.Morbidly Obese  POST-OPERATIVE DIAGNOSIS:  Osteoarthritis of the Left Knee.Morbidily Obese.  PROCEDURE:  Procedure(s): LEFT TOTAL KNEE ARTHROPLASTY (Left),COMPLEX in a Morbidly Obese Patient.  SURGEON:  Surgeon(s) and Role:    * Jacki Cones, MD - Primary  PHYSICIAN ASSISTANT: Dimitri Ped PA  ASSISTANTS: Dimitri Ped PA   ANESTHESIA:   general  EBL:  Total I/O In: 1000 [I.V.:1000] Out: 175 [Urine:125; Blood:50]  BLOOD ADMINISTERED:none  DRAINS: (One ) Hemovact drain(s) in the Left Knee with  Suction Open   LOCAL MEDICATIONS USED:  BUPIVICAINE 20cc mixed with 20cc Normal Saline  SPECIMEN:  No Specimen  DISPOSITION OF SPECIMEN:  N/A  COUNTS:  YES  TOURNIQUET:  * Missing tourniquet times found for documented tourniquets in log:  [id number] *  DICTATION: .Other Dictation: Dictation Number [id number]  PLAN OF CARE: Admit to inpatient   PATIENT DISPOSITION:  Stable in OR   Delay start of Pharmacological VTE agent (>24hrs) due to surgical blood loss or risk of bleeding: yes

## 2013-03-05 ENCOUNTER — Encounter (HOSPITAL_COMMUNITY): Payer: Self-pay | Admitting: Orthopedic Surgery

## 2013-03-05 LAB — CBC
HCT: 37.1 % (ref 36.0–46.0)
Hemoglobin: 11.9 g/dL — ABNORMAL LOW (ref 12.0–15.0)
MCHC: 32.1 g/dL (ref 30.0–36.0)
MCV: 87.5 fL (ref 78.0–100.0)

## 2013-03-05 LAB — BASIC METABOLIC PANEL
BUN: 9 mg/dL (ref 6–23)
Chloride: 95 mEq/L — ABNORMAL LOW (ref 96–112)
GFR calc non Af Amer: >90 mL/min (ref >90)
Glucose, Bld: 274 mg/dL — ABNORMAL HIGH (ref 70–99)
Potassium: 4.2 mEq/L (ref 3.5–5.1)

## 2013-03-05 NOTE — Progress Notes (Signed)
IUtilization review completed.

## 2013-03-05 NOTE — Progress Notes (Signed)
Inpatient Diabetes Program Recommendations  AACE/ADA: New Consensus Statement on Inpatient Glycemic Control (2013)  Target Ranges:  Prepandial:   less than 140 mg/dL      Peak postprandial:   less than 180 mg/dL (1-2 hours)      Critically ill patients:  140 - 180 mg/dL     Results for POLINA, BURMASTER (MRN [id number]) as of 03/05/2013 09:39  Ref. Range 02/26/2013 14:05  Glucose Latest Range: 70-99 mg/dL 409 (H)    Results for ALLY, KNODEL (MRN [id number]) as of 03/05/2013 09:39  Ref. Range 03/05/2013 04:44  Glucose Latest Range: 70-99 mg/dL 956 (H)    **Noted elevated lab glucose preoperatively (306 mg/dl)  **Elevated lab glucose this morning (274 mg/dl)    Inpatient Diabetes Program Recommendations Correction (SSI): Noted Lab glucose 274 mg/dl this morning.  May want to check CBGs and cover with Novolog Moderate correction scale (SSI) tid with meals if elevated. HgbA1C: Please consider checking a HgbA1c level on this patient given her risk factors (morbidly obese).   Will follow. Ambrose Finland RN, MSN, CDE Diabetes Coordinator Inpatient Diabetes Program [phone]

## 2013-03-05 NOTE — Plan of Care (Signed)
Problem: Consults Goal: Diagnosis- Total Joint Replacement Outcome: Completed/Met Date Met:  03/04/13 Primary Total Knee LEFT

## 2013-03-05 NOTE — Progress Notes (Signed)
Subjective: 1 Day Post-Op Procedure(s) (LRB): LEFT TOTAL KNEE ARTHROPLASTY (Left) Patient reports pain as 6 on 0-10 scale. Ambulating and doing well. Hemovac DCd.  Objective: Vital signs in last 24 hours: Temp:  [97.5 F (36.4 C)-98.7 F (37.1 C)] 97.7 F (36.5 C) (08/20 1410) Pulse Rate:  [105-125] 119 (08/20 1410) Resp:  [16-22] 20 (08/20 1410) BP: (108-168)/(60-91) 115/70 mmHg (08/20 1410) SpO2:  [92 %-99 %] 95 % (08/20 1410) Weight:  [142.429 kg (314 lb)] 142.429 kg (314 lb) (08/19 1900)  Intake/Output from previous day: 08/19 0701 - 08/20 0700 In: 3230 [P.O.:240; I.V.:2890; IV Piggyback:100] Out: 1331 [Urine:1185; Drains:96; Blood:50] Intake/Output this shift: Total I/O In: 240 [P.O.:240] Out: 700 [Urine:700]   Recent Labs  03/05/13 0444  HGB 11.9*    Recent Labs  03/05/13 0444  WBC 12.1*  RBC 4.24  HCT 37.1  PLT 223    Recent Labs  03/05/13 0444  NA 131*  K 4.2  CL 95*  CO2 28  BUN 9  CREATININE 0.57  GLUCOSE 274*  CALCIUM 8.8   No results found for this basename: LABPT, INR,  in the last 72 hours  Dorsiflexion/Plantar flexion intact No cellulitis present  Assessment/Plan: 1 Day Post-Op Procedure(s) (LRB): LEFT TOTAL KNEE ARTHROPLASTY (Left) Up with therapy  Marilene Vath A 03/05/2013, 4:42 PM

## 2013-03-05 NOTE — Progress Notes (Signed)
OT Cancellation Note  Patient Details Name: [name] MRN: [id number] DOB: July 19, 1965   Cancelled Treatment:    Reason Eval/Treat Not Completed: Other (comment) (see pt +2 with PT and not ready for ADL. Will check on pt next date.)  [name] [phone] 03/05/2013, 12:59 PM

## 2013-03-05 NOTE — Evaluation (Signed)
Physical Therapy Evaluation Patient Details Name: [name] MRN: [id number] DOB: 1966-01-01 Today's Date: 03/05/2013 Time: 7253-6644 PT Time Calculation (min): 22 min  PT Assessment / Plan / Recommendation History of Present Illness     Clinical Impression  Pt s/p L TKR presents with decreased L LE strength/ROM, post op pain and elevated anxiety level limiting functional mobility.    PT Assessment  Patient needs continued PT services    Follow Up Recommendations  Home health PT    Does the patient have the potential to tolerate intense rehabilitation      Barriers to Discharge        Equipment Recommendations  None recommended by PT    Recommendations for Other Services OT consult   Frequency 7X/week    Precautions / Restrictions Precautions Precautions: Knee;Fall Required Braces or Orthoses: Knee Immobilizer - Left Knee Immobilizer - Left: Discontinue once straight leg raise with < 10 degree lag Restrictions Weight Bearing Restrictions: No Other Position/Activity Restrictions: WBAT   Pertinent Vitals/Pain 6/10; premed, cold packs provided      Mobility  Bed Mobility Bed Mobility: Supine to Sit Supine to Sit: 3: Mod assist;With rails Details for Bed Mobility Assistance: cues for sequence and use of L LE to self assist Transfers Transfers: Sit to Stand;Stand to Sit Sit to Stand: 1: +2 Total assist;With upper extremity assist;From bed;From chair/3-in-1;With armrests Sit to Stand: Patient Percentage: 70% Stand to Sit: 1: +2 Total assist;To chair/3-in-1;With armrests;With upper extremity assist Stand to Sit: Patient Percentage: 70% Details for Transfer Assistance: cues for LE management and use of UEs to self asist Ambulation/Gait Ambulation/Gait Assistance: 1: +2 Total assist Ambulation/Gait: Patient Percentage: 70% Ambulation Distance (Feet): 7 Feet (and 4) Assistive device: Rolling walker Ambulation/Gait Assistance Details: cues for posture, sequence  and position from RW Gait Pattern: Step-to pattern;Decreased step length - right;Decreased step length - left;Decreased stance time - left;Trunk flexed Stairs: No    Exercises     PT Diagnosis: Difficulty walking  PT Problem List: Decreased strength;Decreased range of motion;Decreased activity tolerance;Decreased mobility;Decreased knowledge of use of DME;Obesity;Pain PT Treatment Interventions: DME instruction;Gait training;Stair training;Functional mobility training;Therapeutic activities;Therapeutic exercise;Patient/family education     PT Goals(Current goals can be found in the care plan section) Acute Rehab PT Goals Patient Stated Goal: Home in 3 days PT Goal Formulation: With patient Time For Goal Achievement: 03/12/13 Potential to Achieve Goals: Good  Visit Information  Last PT Received On: 03/05/13 Assistance Needed: +2       Prior Functioning  Home Living Family/patient expects to be discharged to:: Private residence Living Arrangements: Spouse/significant other Available Help at Discharge: Family Type of Home: House Home Access: Stairs to enter Secretary/administrator of Steps: 1 Entrance Stairs-Rails: None Home Layout: Two level;Able to live on main level with bedroom/bathroom Home Equipment: Walker - 2 wheels Prior Function Level of Independence: Independent;Independent with assistive device(s) Communication Communication: No difficulties    Cognition  Cognition Arousal/Alertness: Awake/alert Behavior During Therapy: WFL for tasks assessed/performed Overall Cognitive Status: Within Functional Limits for tasks assessed    Extremity/Trunk Assessment Upper Extremity Assessment Upper Extremity Assessment: Overall WFL for tasks assessed Lower Extremity Assessment Lower Extremity Assessment: LLE deficits/detail Cervical / Trunk Assessment Cervical / Trunk Assessment: Normal   Balance    End of Session PT - End of Session Equipment Utilized During Treatment:  Gait belt;Left knee immobilizer Activity Tolerance: Patient tolerated treatment well;Other (comment) (elevated anxiety level) Patient left: in chair;with call bell/phone within reach;with family/visitor present Nurse Communication:  Mobility status  GP     [name] 03/05/2013, 12:17 PM

## 2013-03-05 NOTE — Progress Notes (Signed)
Physical Therapy Treatment Patient Details Name: [name] MRN: [id number] DOB: 1966/06/28 Today's Date: 03/05/2013 Time: 4098-1191 PT Time Calculation (min): 33 min  PT Assessment / Plan / Recommendation  History of Present Illness     PT Comments     Follow Up Recommendations  Home health PT     Does the patient have the potential to tolerate intense rehabilitation     Barriers to Discharge        Equipment Recommendations  None recommended by PT    Recommendations for Other Services OT consult  Frequency 7X/week   Progress towards PT Goals Progress towards PT goals: Progressing toward goals  Plan Current plan remains appropriate    Precautions / Restrictions Precautions Precautions: Knee;Fall Required Braces or Orthoses: Knee Immobilizer - Left Knee Immobilizer - Left: Discontinue once straight leg raise with < 10 degree lag Restrictions Weight Bearing Restrictions: No Other Position/Activity Restrictions: WBAT   Pertinent Vitals/Pain 6/10. Premed, ice packs provided    Mobility  Bed Mobility Bed Mobility: Sit to Supine Sit to Supine: 3: Mod assist Details for Bed Mobility Assistance: cues for sequence and use of L LE to self assist Transfers Transfers: Sit to Stand;Stand to Sit Sit to Stand: 1: +2 Total assist;With upper extremity assist;From chair/3-in-1;With armrests Sit to Stand: Patient Percentage: 70% Stand to Sit: 1: +2 Total assist;With upper extremity assist;To bed Stand to Sit: Patient Percentage: 70% Details for Transfer Assistance: cues for LE management and use of UEs to self asist Ambulation/Gait Ambulation/Gait Assistance: 1: +2 Total assist Ambulation/Gait: Patient Percentage: 70% Ambulation Distance (Feet): 18 Feet Assistive device: Rolling walker Ambulation/Gait Assistance Details: cues for posture, sequence, position from RW and stride length Gait Pattern: Step-to pattern;Decreased step length - right;Decreased step length -  left;Decreased stance time - left;Trunk flexed Gait velocity: decreased Stairs: No    Exercises Total Joint Exercises Ankle Circles/Pumps: AROM;10 reps;Supine;Both Quad Sets: AROM;Both;10 reps;Supine Heel Slides: AAROM;Left;10 reps;Supine Straight Leg Raises: AAROM;Left;10 reps;Supine   PT Diagnosis:    PT Problem List:   PT Treatment Interventions:     PT Goals (current goals can now be found in the care plan section) Acute Rehab PT Goals Patient Stated Goal: Home in 3 days PT Goal Formulation: With patient Time For Goal Achievement: 03/12/13 Potential to Achieve Goals: Good  Visit Information  Last PT Received On: 03/05/13 Assistance Needed: +2    Subjective Data  Patient Stated Goal: Home in 3 days   Cognition  Cognition Arousal/Alertness: Awake/alert Behavior During Therapy: WFL for tasks assessed/performed Overall Cognitive Status: Within Functional Limits for tasks assessed    Balance     End of Session PT - End of Session Equipment Utilized During Treatment: Gait belt;Left knee immobilizer Activity Tolerance: Patient tolerated treatment well Patient left: in bed;with call bell/phone within reach;with family/visitor present Nurse Communication: Mobility status   GP     [name] 03/05/2013, 3:25 PM

## 2013-03-05 NOTE — Op Note (Signed)
[name], [name]              ACCOUNT NO.:  [id number]  MEDICAL RECORD NO.:  [id number]  LOCATION:  1613                         FACILITY:  [facility]  PHYSICIAN:  Georges Lynch. Zamire Whitehurst, M.D.DATE OF BIRTH:  Dec 06, 1965  DATE OF PROCEDURE:  03/04/2013 DATE OF DISCHARGE:                              OPERATIVE REPORT   SURGEON:  Georges Lynch. Darrelyn Hillock, M.D.  OPERATIVE ASSISTANT:  Dimitri Ped, PA.  PREOPERATIVE DIAGNOSES: 1. Morbid obesity. 2. Severe degenerative arthritis, left knee with bone-on-bone and a     flexion contracture.  POSTOPERATIVE DIAGNOSES: 1. Morbid obesity. 2. Severe degenerative arthritis, left knee with bone-on-bone and a     flexion contracture.  OPERATION:  Left total knee arthroplasty utilizing the DePuy system.  I used a size 3 left femoral component posterior cruciate sacrificing type.  The tray was a size 3 tray with a 10-mm thickness, size 3 rotating platform.  The tray we used was a revision tray.  The patella was a size 35 with 3 pegs.  All 3 components were cemented and gentamicin was used in the cement.  PROCEDURE:  Under general anesthesia, routine orthopedic prep and draping of the left lower extremity was carried out.  The appropriate time-out was first carried out.  I also marked the appropriate left leg in the holding area.  At this time, the leg was exsanguinated with an Esmarch, tourniquet was elevated at 350 mmHg.  The knee was placed in the [facility] knee holder and was flexed and incision was made over the anterior aspect of the left knee.  The patient did have 3 g of IV Ancef preop.  The bleeder was identified and cauterized after the incision was made.  Self-retaining retractors were inserted.  I then carried out a median parapatellar incision.  I reflected the patella laterally.  I then did medial and lateral meniscectomies and excised the anterior and posterior cruciate ligaments.  Note, this is a very difficult case because of her morbid  obesity.  I then made my initial drill hole in the intercondylar notch, inserted the guide rod up the canal finder up the canal, thoroughly irrigated out the canal first to prevent any emboli. I then did the appropriate measurements for a size 3 left femur.  I carried out my appropriate cut by removing first 12 mm thickness of the distal femur.  Then, we measured the femur to be a size 3 and made the appropriate anterior, posterior, and chamfering cuts.  After this, I measured the tibia to be a size 3.  We elected to use a revision tibial tray.  Following that, I removed 6 mm thickness off the affected medial side.  Appropriate measurements were taken.  This was done after we went through our spacer devices to measure the flexion and extension gaps.  I then prepared the tibia in the usual fashion.  I removed 6 mm thickness off the tibia.  The appropriate drill holes were made for the revision stem.  At this time, I then prepared the femur in the usual fashion.  I utilized my notch cut.  Trial components then were inserted, we had an excellent fit with the 10 mm thickness.  We  could really not go any thicker than that.  Following that, I then prepared the patella.  I removed the appropriate amount of bone off patella for resurfacing procedure.  Three drill holes were made for a size 35 patellar button. All trial components were removed.  I thoroughly water picked out the knee, dried the knee out, cemented all 3 components in simultaneously. All loose pieces of cement were removed, thoroughly water picked out the knee.  I then inserted my rotating platform, a size 3.  The knee was reduced.  We had an excellent function.  We could not go any thicker than the 10 mm thickness insert.  At this time, we injected our mixture of Exparel 20 mL with 20 mL of normal saline into the soft tissue structures.  After the cement was hardened, we then searched for loose pieces of cement and note that was  done before the final insert was inserted.  After that, we then inserted the Hemovac drain, closed the knee in layers in usual fashion.  Sterile dressings were applied.          ______________________________ Georges Lynch Darrelyn Hillock, M.D.     RAG/MEDQ  D:  03/04/2013  T:  03/05/2013  Job:  [id number]

## 2013-03-06 DIAGNOSIS — D62 Acute posthemorrhagic anemia: Secondary | ICD-10-CM | POA: Diagnosis present

## 2013-03-06 LAB — CBC
HCT: 34 % — ABNORMAL LOW (ref 36.0–46.0)
Hemoglobin: 11 g/dL — ABNORMAL LOW (ref 12.0–15.0)
MCH: 28.2 pg (ref 26.0–34.0)
MCHC: 32.4 g/dL (ref 30.0–36.0)

## 2013-03-06 LAB — BASIC METABOLIC PANEL
BUN: 7 mg/dL (ref 6–23)
Calcium: 9.1 mg/dL (ref 8.4–10.5)
GFR calc non Af Amer: >90 mL/min (ref >90)
Glucose, Bld: 240 mg/dL — ABNORMAL HIGH (ref 70–99)

## 2013-03-06 MED ORDER — HYDROMORPHONE HCL 2 MG PO TABS
2.0000 mg | ORAL_TABLET | ORAL | Status: DC | PRN
  Administered 2013-03-06: 2 mg via ORAL
  Administered 2013-03-06 – 2013-03-07 (×2): 4 mg via ORAL
  Administered 2013-03-07 (×2): 2 mg via ORAL
  Filled 2013-03-06 (×2): qty 1
  Filled 2013-03-06 (×2): qty 2
  Filled 2013-03-06: qty 1

## 2013-03-06 NOTE — Progress Notes (Signed)
Subjective: 2 Days Post-Op Procedure(s) (LRB): LEFT TOTAL KNEE ARTHROPLASTY (Left) Patient reports pain as 4 on 0-10 scale.  Ambulating and doing much better today.  Objective: Vital signs in last 24 hours: Temp:  [97.6 F (36.4 C)-98.9 F (37.2 C)] 97.7 F (36.5 C) (08/21 0532) Pulse Rate:  [113-125] 113 (08/21 0532) Resp:  [18-20] 20 (08/21 0532) BP: (93-168)/(62-91) 96/67 mmHg (08/21 0532) SpO2:  [94 %-97 %] 95 % (08/21 0532)  Intake/Output from previous day: 08/20 0701 - 08/21 0700 In: 2208 [P.O.:1320; I.V.:888] Out: 4200 [Urine:4200] Intake/Output this shift:     Recent Labs  03/05/13 0444 03/06/13 0415  HGB 11.9* 11.0*    Recent Labs  03/05/13 0444 03/06/13 0415  WBC 12.1* 11.9*  RBC 4.24 3.90  HCT 37.1 34.0*  PLT 223 179    Recent Labs  03/05/13 0444 03/06/13 0415  NA 131* 131*  K 4.2 3.7  CL 95* 92*  CO2 28 33*  BUN 9 7  CREATININE 0.57 0.59  GLUCOSE 274* 240*  CALCIUM 8.8 9.1   No results found for this basename: LABPT, INR,  in the last 72 hours  Dorsiflexion/Plantar flexion intact  Assessment/Plan: 2 Days Post-Op Procedure(s) (LRB): LEFT TOTAL KNEE ARTHROPLASTY (Left) Up with therapy  Ashton Belote A 03/06/2013, 9:29 AM

## 2013-03-06 NOTE — Progress Notes (Signed)
Physical Therapy Treatment Patient Details Name: [name] MRN: [id number] DOB: [birth year] Today's Date: 03/06/2013 Time: 6213-0865 PT Time Calculation (min): 28 min  PT Assessment / Plan / Recommendation  History of Present Illness     PT Comments   Pt motivated but struggling with pain despite being premedicated.  RN aware and contacting dr.  Follow Up Recommendations  Home health PT     Does the patient have the potential to tolerate intense rehabilitation     Barriers to Discharge        Equipment Recommendations  None recommended by PT    Recommendations for Other Services OT consult  Frequency 7X/week   Progress towards PT Goals Progress towards PT goals: Progressing toward goals  Plan Current plan remains appropriate    Precautions / Restrictions Precautions Precautions: Knee;Fall Required Braces or Orthoses: Knee Immobilizer - Left Knee Immobilizer - Left: Discontinue once straight leg raise with < 10 degree lag Restrictions Weight Bearing Restrictions: No Other Position/Activity Restrictions: WBAT   Pertinent Vitals/Pain 10/10; premed, ice packs provided    Mobility  Bed Mobility Bed Mobility: Sit to Supine Sit to Supine: 4: Min assist Details for Bed Mobility Assistance: cues for sequence and use of L LE to self assist Transfers Transfers: Sit to Stand;Stand to Sit Sit to Stand: 4: Min guard;From chair/3-in-1 Stand to Sit: 4: Min guard;To bed;To chair/3-in-1 Details for Transfer Assistance: cues for LE placement Ambulation/Gait Ambulation/Gait Assistance: 4: Min assist Ambulation Distance (Feet): 35 Feet (and 5) Assistive device: Rolling walker Ambulation/Gait Assistance Details: cues for position from RW Gait Pattern: Step-to pattern;Decreased step length - right;Decreased step length - left;Decreased stance time - left;Trunk flexed Gait velocity: decreased    Exercises     PT Diagnosis:    PT Problem List:   PT Treatment Interventions:      PT Goals (current goals can now be found in the care plan section) Acute Rehab PT Goals Patient Stated Goal: Home in 3 days PT Goal Formulation: With patient Time For Goal Achievement: 03/12/13 Potential to Achieve Goals: Good  Visit Information  Last PT Received On: 03/06/13 Assistance Needed: +1    Subjective Data  Subjective: I'm glad you're pushing me Patient Stated Goal: Home in 3 days   Cognition  Cognition Arousal/Alertness: Awake/alert Behavior During Therapy: WFL for tasks assessed/performed Overall Cognitive Status: Within Functional Limits for tasks assessed    Balance     End of Session PT - End of Session Equipment Utilized During Treatment: Gait belt;Left knee immobilizer Activity Tolerance: Patient tolerated treatment well;Patient limited by pain Patient left: in bed;with call bell/phone within reach Nurse Communication: Mobility status   GP     [name] 03/06/2013, 3:48 PM

## 2013-03-06 NOTE — Evaluation (Signed)
Occupational Therapy Evaluation Patient Details Name: [name] MRN: [id number] DOB: 10-16-1965 Today's Date: 03/06/2013 Time: 4098-1191 OT Time Calculation (min): 30 min  OT Assessment / Plan / Recommendation History of present illness     Clinical Impression   Pt was admitted for L TKA.  She had R TKA earlier this year.  Reviewed all education.  Pt will not need follow up OT.     OT Assessment  Patient does not need any further OT services    Follow Up Recommendations  No OT follow up    Barriers to Discharge      Equipment Recommendations   (pt will borrow wide 3:1 commode)    Recommendations for Other Services    Frequency       Precautions / Restrictions Precautions Precautions: Knee;Fall Required Braces or Orthoses: Knee Immobilizer - Left Knee Immobilizer - Left: Discontinue once straight leg raise with < 10 degree lag Restrictions Weight Bearing Restrictions: No Other Position/Activity Restrictions: WBAT   Pertinent Vitals/Pain Shoulders/pects sore; L knee 5/10.  Repositioned with ice    ADL  Grooming: Set up;Wash/dry hands;Wash/dry face Where Assessed - Grooming: Unsupported sitting Upper Body Bathing: Set up Where Assessed - Upper Body Bathing: Unsupported sitting Lower Body Bathing: Maximal assistance Where Assessed - Lower Body Bathing: Supported sit to stand Upper Body Dressing: Minimal assistance (iv) Where Assessed - Upper Body Dressing: Unsupported sitting Lower Body Dressing: Maximal assistance Where Assessed - Lower Body Dressing: Supported sit to stand Toilet Transfer: Hydrographic surveyor Method: Sit to Barista: Extra wide bedside commode Toileting - Clothing Manipulation and Hygiene: +1 Total assistance Where Assessed - Glass blower/designer Manipulation and Hygiene: Standing Equipment Used: Rolling walker Transfers/Ambulation Related to ADLs: ambulated across room for transfer to 3:1.  Moves well but extra  time needed ADL Comments: Performed ADl when sitting on commode.  Mother present and will help with adls.  Pt has AE and will use it when she can lift leg. She also has a toilet aide at home.  Pt will sponge bathe initially.  Recalls stepping into shower backwards--will not practice in acute setting    OT Diagnosis:    OT Problem List:   OT Treatment Interventions:     OT Goals(Current goals can be found in the care plan section)    Visit Information  Last OT Received On: 03/06/13 Assistance Needed: +1       Prior Functioning     Home Living Family/patient expects to be discharged to:: Private residence Living Arrangements: Spouse/significant other Available Help at Discharge: Family Additional Comments: can borrow a wide commode--making arrangements Prior Function Level of Independence: Independent with assistive device(s) Comments: uses AE when needed Communication Communication: No difficulties         Vision/Perception     Cognition  Cognition Arousal/Alertness: Awake/alert Behavior During Therapy: WFL for tasks assessed/performed Overall Cognitive Status: Within Functional Limits for tasks assessed    Extremity/Trunk Assessment Upper Extremity Assessment Upper Extremity Assessment: Overall WFL for tasks assessed (sore from using so much with RW)     Mobility Transfers Sit to Stand: 4: Min assist;4: Min guard;From chair/3-in-1;With armrests Details for Transfer Assistance: cues for LE placement     Exercise     Balance     End of Session OT - End of Session Activity Tolerance: Patient limited by fatigue Patient left: in chair;with call bell/phone within reach;with family/visitor present  GO     [name] 03/06/2013, 10:45 AM [name]  [name], OTR/L [phone] 03/06/2013

## 2013-03-06 NOTE — Care Management Note (Addendum)
    Page 1 of 1   03/07/2013     4:43:28 PM   CARE MANAGEMENT NOTE 03/07/2013  Patient:  [name], [name]   Account Number:  [id number]  Date Initiated:  03/06/2013  Documentation initiated by:  Colleen Can  Subjective/Objective Assessment:   dx total knee replacemnt-left    Pre-arranged with Genevieve Norlander to provide HHpt which start on day after discharge.     Action/Plan:   CM spoke with patient. Plans are for patient to return to her home where spouse and sister will be caregivers. She already has RW and plans to borrow 3n1 from [employer].   Anticipated DC Date:  03/07/2013   Anticipated DC Plan:  HOME W HOME HEALTH SERVICES      DC Planning Services  CM consult      [facility] Choice  HOME HEALTH   Choice offered to / List presented to:  C-1 Patient        HH arranged  HH-2 PT      [facility] agency  [facility]   Status of service:  Completed, signed off Medicare Important Message given?   (If response is "NO", the following Medicare IM given date fields will be blank) Date Medicare IM given:   Date Additional Medicare IM given:    Discharge Disposition:  HOME W HOME HEALTH SERVICES  Per UR Regulation:  Reviewed for med. necessity/level of care/duration of stay  If discussed at Long Length of Stay Meetings, dates discussed:    Comments:  03/07/2013 Colleen Can BSN RN CCM [phone] Gen[facility] will arrange for Home cpm.

## 2013-03-06 NOTE — Progress Notes (Signed)
Physical Therapy Treatment Patient Details Name: [name] MRN: [id number] DOB: 1966/04/03 Today's Date: 03/06/2013 Time: 0811-0850 PT Time Calculation (min): 39 min  PT Assessment / Plan / Recommendation  History of Present Illness     PT Comments   Marked improvement in activity tolerance and motivation.  Follow Up Recommendations  Home health PT     Does the patient have the potential to tolerate intense rehabilitation     Barriers to Discharge        Equipment Recommendations  None recommended by PT    Recommendations for Other Services OT consult  Frequency 7X/week   Progress towards PT Goals Progress towards PT goals: Progressing toward goals  Plan Current plan remains appropriate    Precautions / Restrictions Precautions Precautions: Knee;Fall Required Braces or Orthoses: Knee Immobilizer - Left Knee Immobilizer - Left: Discontinue once straight leg raise with < 10 degree lag Restrictions Weight Bearing Restrictions: No Other Position/Activity Restrictions: WBAT   Pertinent Vitals/Pain 6/10; premed, cold packs provided    Mobility  Bed Mobility Bed Mobility: Supine to Sit Supine to Sit: 4: Min assist;With rails;HOB elevated Details for Bed Mobility Assistance: cues for sequence and use of L LE to self assist Transfers Transfers: Sit to Stand;Stand to Sit Sit to Stand: 4: Min assist;4: Min guard;From chair/3-in-1;With armrests Stand to Sit: 4: Min assist Details for Transfer Assistance: cues for LE placement Ambulation/Gait Ambulation/Gait Assistance: 4: Min assist Ambulation Distance (Feet): 45 Feet Assistive device: Rolling walker Ambulation/Gait Assistance Details: cues for posture and position from RW Gait Pattern: Step-to pattern;Decreased step length - right;Decreased step length - left;Decreased stance time - left;Trunk flexed Gait velocity: decreased Stairs: No    Exercises Total Joint Exercises Ankle Circles/Pumps: AROM;Supine;Both;20  reps Quad Sets: AROM;Both;Supine;20 reps Heel Slides: AAROM;Left;Supine;20 reps Straight Leg Raises: AAROM;Left;Supine;20 reps   PT Diagnosis:    PT Problem List:   PT Treatment Interventions:     PT Goals (current goals can now be found in the care plan section) Acute Rehab PT Goals Patient Stated Goal: Home in 3 days PT Goal Formulation: With patient Time For Goal Achievement: 03/12/13 Potential to Achieve Goals: Good  Visit Information  Last PT Received On: 03/06/13 Assistance Needed: +1    Subjective Data  Subjective: I'm glad you're pushing me Patient Stated Goal: Home in 3 days   Cognition  Cognition Arousal/Alertness: Awake/alert Behavior During Therapy: WFL for tasks assessed/performed Overall Cognitive Status: Within Functional Limits for tasks assessed    Balance     End of Session PT - End of Session Equipment Utilized During Treatment: Gait belt;Left knee immobilizer Activity Tolerance: Patient tolerated treatment well Patient left: in chair;with call bell/phone within reach Nurse Communication: Mobility status   GP     [name] 03/06/2013, 11:23 AM

## 2013-03-07 LAB — CBC
MCH: 28.4 pg (ref 26.0–34.0)
MCHC: 32.4 g/dL (ref 30.0–36.0)
Platelets: 190 10*3/uL (ref 150–400)

## 2013-03-07 MED ORDER — RIVAROXABAN 10 MG PO TABS: 10.0000 mg | ORAL_TABLET | Freq: Every day | ORAL | Status: DC

## 2013-03-07 MED ORDER — METHOCARBAMOL 500 MG PO TABS: 500.0000 mg | ORAL_TABLET | Freq: Four times a day (QID) | ORAL | Status: DC | PRN

## 2013-03-07 MED ORDER — HYDROMORPHONE HCL 2 MG PO TABS: 2.0000 mg | ORAL_TABLET | ORAL | Status: DC | PRN

## 2013-03-07 MED ORDER — FERROUS SULFATE 325 (65 FE) MG PO TABS: 325.0000 mg | ORAL_TABLET | Freq: Three times a day (TID) | ORAL | Status: DC

## 2013-03-07 NOTE — Progress Notes (Signed)
Physical Therapy Treatment Patient Details Name: [name] MRN: [id number] DOB: Nov 24, 1965 Today's Date: 03/07/2013 Time: 1011-1052 PT Time Calculation (min): 41 min  PT Assessment / Plan / Recommendation  History of Present Illness     PT Comments     Follow Up Recommendations  Home health PT     Does the patient have the potential to tolerate intense rehabilitation     Barriers to Discharge        Equipment Recommendations  None recommended by PT    Recommendations for Other Services OT consult  Frequency 7X/week   Progress towards PT Goals Progress towards PT goals: Progressing toward goals  Plan Current plan remains appropriate    Precautions / Restrictions Precautions Precautions: Knee;Fall Required Braces or Orthoses: Knee Immobilizer - Left Knee Immobilizer - Left: Discontinue once straight leg raise with < 10 degree lag Restrictions Weight Bearing Restrictions: No Other Position/Activity Restrictions: WBAT   Pertinent Vitals/Pain 5/10; premed, cold packs provided    Mobility  Bed Mobility Bed Mobility: Supine to Sit Supine to Sit: 5: Supervision Details for Bed Mobility Assistance: cues for sequence and use of L LE to self assist Transfers Transfers: Sit to Stand;Stand to Sit Sit to Stand: 5: Supervision;From bed;From chair/3-in-1 Stand to Sit: 5: Supervision;To chair/3-in-1 Details for Transfer Assistance: cues for LE placement Ambulation/Gait Ambulation/Gait Assistance: 4: Min guard;5: Supervision Ambulation Distance (Feet): 50 Feet (and 8') Assistive device: Rolling walker Ambulation/Gait Assistance Details: min cues for position from RW Gait Pattern: Step-to pattern;Decreased step length - right;Decreased step length - left;Trunk flexed Gait velocity: decreased Stairs: Yes Stairs Assistance: 4: Min assist Stairs Assistance Details (indicate cue type and reason): cues for sequence and foot/RW placement Stair Management Technique: No  rails;Step to pattern;Backwards;With walker Number of Stairs: 1    Exercises Total Joint Exercises Ankle Circles/Pumps: AROM;Supine;Both;20 reps Quad Sets: AROM;Both;Supine;20 reps Heel Slides: AAROM;Left;Supine;20 reps Straight Leg Raises: AAROM;Left;Supine;20 reps Goniometric ROM: AAROM at knee -10 - 60   PT Diagnosis:    PT Problem List:   PT Treatment Interventions:     PT Goals (current goals can now be found in the care plan section) Acute Rehab PT Goals Patient Stated Goal: Home in 3 days PT Goal Formulation: With patient Time For Goal Achievement: 03/12/13 Potential to Achieve Goals: Good  Visit Information  Last PT Received On: 03/07/13 Assistance Needed: +1    Subjective Data  Subjective: I am ready to go home Patient Stated Goal: Home in 3 days   Cognition  Cognition Arousal/Alertness: Awake/alert Behavior During Therapy: WFL for tasks assessed/performed Overall Cognitive Status: Within Functional Limits for tasks assessed    Balance     End of Session PT - End of Session Equipment Utilized During Treatment: Gait belt;Left knee immobilizer Activity Tolerance: Patient tolerated treatment well Patient left: in chair;with call bell/phone within reach;with family/visitor present Nurse Communication: Mobility status   GP     Gay Moncivais 03/07/2013, 12:08 PM

## 2013-03-07 NOTE — Progress Notes (Signed)
[facility]  [facility] is providing the following services: patient declined commode  If patient discharges after hours, please call [phone].   [name] 03/07/2013, 9:15 AM

## 2013-03-07 NOTE — Progress Notes (Signed)
   Subjective: 3 Days Post-Op Procedure(s) (LRB): LEFT TOTAL KNEE ARTHROPLASTY (Left) Patient reports pain as mild.   Patient seen in rounds with Dr. Darrelyn Hillock. Patient is well, and has had no acute complaints or problems. She feels that PT is going well. No SOB or chest pain.  Plan is to go Home after hospital stay.  Objective: Vital signs in last 24 hours: Temp:  [98 F (36.7 C)-98.3 F (36.8 C)] 98.3 F (36.8 C) (08/22 0504) Pulse Rate:  [100-137] 122 (08/22 0504) Resp:  [20] 20 (08/22 0504) BP: (95-112)/(61-71) 95/61 mmHg (08/22 0504) SpO2:  [97 %-99 %] 97 % (08/22 0504)  Intake/Output from previous day:  Intake/Output Summary (Last 24 hours) at 03/07/13 0827 Last data filed at 03/07/13 0504  Gross per 24 hour  Intake 565.67 ml  Output   3625 ml  Net -3059.33 ml    Intake/Output this shift:    Labs:  Recent Labs  03/05/13 0444 03/06/13 0415 03/07/13 0432  HGB 11.9* 11.0* 11.0*    Recent Labs  03/06/13 0415 03/07/13 0432  WBC 11.9* 9.8  RBC 3.90 3.87  HCT 34.0* 33.9*  PLT 179 190    Recent Labs  03/05/13 0444 03/06/13 0415  NA 131* 131*  K 4.2 3.7  CL 95* 92*  CO2 28 33*  BUN 9 7  CREATININE 0.57 0.59  GLUCOSE 274* 240*  CALCIUM 8.8 9.1   No results found for this basename: LABPT, INR,  in the last 72 hours  EXAM General - Patient is Alert and Oriented Extremity - Neurologically intact Dorsiflexion/Plantar flexion intact Incision: no drainage Dressing/Incision - clean, dry Motor Function - intact, moving foot and toes well on exam.   Past Medical History  Diagnosis Date  . Chest pain     Nuclear, November, 2012, no ischemia, normal ejection fraction.  . Tachycardia   . Anxiety   . Dyslipidemia   . Hypothyroidism   . Depression   . Fibromyalgia   . Dizziness     RESOLVED  . Fibromyalgia muscle pain   . Ejection fraction     EF 60%, echo, 05/2011  . Bipolar 1 disorder   . Hypertension   . Sleep apnea     cpap setting at 4     . Incontinence   . Headache(784.0)     occasional   . Arthritis   . GERD (gastroesophageal reflux disease)     OCCAS - DIET CONTROLLED    Assessment/Plan: 3 Days Post-Op Procedure(s) (LRB): LEFT TOTAL KNEE ARTHROPLASTY (Left) Active Problems:   Osteoarthritis of left knee   Acute blood loss anemia  Estimated body mass index is 55.64 kg/(m^2) as calculated from the following:   Height as of this encounter: 5\' 3"  (1.6 m).   Weight as of this encounter: 142.429 kg (314 lb). Advance diet Up with therapy D/C IV fluids Discharge home with home health  DVT Prophylaxis - Xarelto Weight-Bearing as tolerated   She is progressing well. Will DC home today after morning PT. Discharge instructions given. Will follow up in 2 weeks.   Davius Goudeau LAUREN 03/07/2013, 8:27 AM

## 2013-03-12 ENCOUNTER — Other Ambulatory Visit (HOSPITAL_COMMUNITY): Payer: Self-pay | Admitting: Family Medicine

## 2013-03-12 DIAGNOSIS — Z1231 Encounter for screening mammogram for malignant neoplasm of breast: Secondary | ICD-10-CM

## 2013-03-18 NOTE — Discharge Summary (Signed)
Physician Discharge Summary   Patient ID: [name] MRN: [id number] DOB/AGE: 11-05-1965 47 y.o.  Admit date: 03/04/2013 Discharge date: 03/07/2013  Primary Diagnosis: osteoarthritis, left knee  Admission Diagnoses:  Past Medical History  Diagnosis Date  . Chest pain     Nuclear, November, 2012, no ischemia, normal ejection fraction.  . Tachycardia   . Anxiety   . Dyslipidemia   . Hypothyroidism   . Depression   . Fibromyalgia   . Dizziness     RESOLVED  . Fibromyalgia muscle pain   . Ejection fraction     EF 60%, echo, 05/2011  . Bipolar 1 disorder   . Hypertension   . Sleep apnea     cpap setting at 4   . Incontinence   . Headache(784.0)     occasional   . Arthritis   . GERD (gastroesophageal reflux disease)     OCCAS - DIET CONTROLLED   Discharge Diagnoses:   Active Problems:   Osteoarthritis of left knee   Acute blood loss anemia  Estimated body mass index is 55.64 kg/(m^2) as calculated from the following:   Height as of this encounter: 5\' 3"  (1.6 m).   Weight as of this encounter: 142.429 kg (314 lb).  Procedure:  Procedure(s) (LRB): LEFT TOTAL KNEE ARTHROPLASTY (Left)   Consults: None  HPI: [name], 47 y.o. female, has a history of pain and functional disability in the left knee due to arthritis and has failed non-surgical conservative treatments for greater than 12 weeks to includeNSAID's and/or analgesics, corticosteriod injections, viscosupplementation injections, weight reduction as appropriate and activity modification. Onset of symptoms was gradual, starting 2 years ago with gradually worsening course since that time. The patient noted no past surgery on the left knee(s). Patient currently rates pain in the left knee(s) at 7 out of 10 with activity. Patient has night pain, worsening of pain with activity and weight bearing, pain that interferes with activities of daily living, pain with passive range of motion, crepitus and joint swelling.  Patient has evidence of periarticular osteophytes and joint space narrowing by imaging studies. There is no active infection.   Laboratory Data: Admission on 03/04/2013, Discharged on 03/07/2013  Component Date Value Range Status  . WBC 03/05/2013 12.1* 4.0 - 10.5 K/uL Final  . RBC 03/05/2013 4.24  3.87 - 5.11 MIL/uL Final  . Hemoglobin 03/05/2013 11.9* 12.0 - 15.0 g/dL Final  . HCT 14/78/2956 37.1  36.0 - 46.0 % Final  . MCV 03/05/2013 87.5  78.0 - 100.0 fL Final  . MCH 03/05/2013 28.1  26.0 - 34.0 pg Final  . MCHC 03/05/2013 32.1  30.0 - 36.0 g/dL Final  . RDW 21/30/8657 17.8* 11.5 - 15.5 % Final  . Platelets 03/05/2013 223  150 - 400 K/uL Final  . Sodium 03/05/2013 131* 135 - 145 mEq/L Final  . Potassium 03/05/2013 4.2  3.5 - 5.1 mEq/L Final  . Chloride 03/05/2013 95* 96 - 112 mEq/L Final  . CO2 03/05/2013 28  19 - 32 mEq/L Final  . Glucose, Bld 03/05/2013 274* 70 - 99 mg/dL Final  . BUN 84/69/6295 9  6 - 23 mg/dL Final  . Creatinine, Ser 03/05/2013 0.57  0.50 - 1.10 mg/dL Final  . Calcium 28/41/3244 8.8  8.4 - 10.5 mg/dL Final  . GFR calc non Af Amer 03/05/2013 >90  >90 mL/min Final  . GFR calc Af Amer 03/05/2013 >90  >90 mL/min Final   Comment: (NOTE)  The eGFR has been calculated using the CKD EPI equation.                          This calculation has not been validated in all clinical situations.                          eGFR's persistently <90 mL/min signify possible Chronic Kidney                          Disease.  . WBC 03/06/2013 11.9* 4.0 - 10.5 K/uL Final  . RBC 03/06/2013 3.90  3.87 - 5.11 MIL/uL Final  . Hemoglobin 03/06/2013 11.0* 12.0 - 15.0 g/dL Final  . HCT 16/04/9603 34.0* 36.0 - 46.0 % Final  . MCV 03/06/2013 87.2  78.0 - 100.0 fL Final  . MCH 03/06/2013 28.2  26.0 - 34.0 pg Final  . MCHC 03/06/2013 32.4  30.0 - 36.0 g/dL Final  . RDW 54/03/8118 17.5* 11.5 - 15.5 % Final  . Platelets 03/06/2013 179  150 - 400 K/uL Final  . Sodium  03/06/2013 131* 135 - 145 mEq/L Final  . Potassium 03/06/2013 3.7  3.5 - 5.1 mEq/L Final  . Chloride 03/06/2013 92* 96 - 112 mEq/L Final  . CO2 03/06/2013 33* 19 - 32 mEq/L Final  . Glucose, Bld 03/06/2013 240* 70 - 99 mg/dL Final  . BUN 14/78/2956 7  6 - 23 mg/dL Final  . Creatinine, Ser 03/06/2013 0.59  0.50 - 1.10 mg/dL Final  . Calcium 21/30/8657 9.1  8.4 - 10.5 mg/dL Final  . GFR calc non Af Amer 03/06/2013 >90  >90 mL/min Final  . GFR calc Af Amer 03/06/2013 >90  >90 mL/min Final   Comment: (NOTE)                          The eGFR has been calculated using the CKD EPI equation.                          This calculation has not been validated in all clinical situations.                          eGFR's persistently <90 mL/min signify possible Chronic Kidney                          Disease.  . WBC 03/07/2013 9.8  4.0 - 10.5 K/uL Final  . RBC 03/07/2013 3.87  3.87 - 5.11 MIL/uL Final  . Hemoglobin 03/07/2013 11.0* 12.0 - 15.0 g/dL Final  . HCT 84/69/6295 33.9* 36.0 - 46.0 % Final  . MCV 03/07/2013 87.6  78.0 - 100.0 fL Final  . MCH 03/07/2013 28.4  26.0 - 34.0 pg Final  . MCHC 03/07/2013 32.4  30.0 - 36.0 g/dL Final  . RDW 28/41/3244 17.5* 11.5 - 15.5 % Final  . Platelets 03/07/2013 190  150 - 400 K/uL Final  Hospital Outpatient Visit on 02/26/2013  Component Date Value Range Status  . WBC 02/26/2013 7.6  4.0 - 10.5 K/uL Final  . RBC 02/26/2013 4.75  3.87 - 5.11 MIL/uL Final  . Hemoglobin 02/26/2013 13.4  12.0 - 15.0 g/dL Final  . HCT 07/19/7251 40.7  36.0 - 46.0 % Final  . MCV 02/26/2013  85.7  78.0 - 100.0 fL Final  . MCH 02/26/2013 28.2  26.0 - 34.0 pg Final  . MCHC 02/26/2013 32.9  30.0 - 36.0 g/dL Final  . RDW 16/04/9603 17.6* 11.5 - 15.5 % Final  . Platelets 02/26/2013 240  150 - 400 K/uL Final  . Preg, Serum 02/26/2013 NEGATIVE  NEGATIVE Final   Comment:                                 THE SENSITIVITY OF THIS                          METHODOLOGY IS >10 mIU/mL.  . MRSA,  PCR 02/26/2013 NEGATIVE  NEGATIVE Final  . Staphylococcus aureus 02/26/2013 NEGATIVE  NEGATIVE Final   Comment:                                 The Xpert SA Assay (FDA                          approved for NASAL specimens                          in patients over 75 years of age),                          is one component of                          a comprehensive surveillance                          program.  Test performance has                          been validated by [employer] for patients greater                          than or equal to 72 year old.                          It is not intended                          to diagnose infection nor to                          guide or monitor treatment.  Marland Kitchen aPTT 02/26/2013 36  24 - 37 seconds Final  . Sodium 02/26/2013 135  135 - 145 mEq/L Final  . Potassium 02/26/2013 3.9  3.5 - 5.1 mEq/L Final  . Chloride 02/26/2013 97  96 - 112 mEq/L Final  . CO2 02/26/2013 31  19 - 32 mEq/L Final  . Glucose, Bld 02/26/2013 306* 70 - 99 mg/dL Final  . BUN 54/03/8118 17  6 - 23 mg/dL Final  . Creatinine, Ser 02/26/2013 0.74  0.50 - 1.10 mg/dL  Final  . Calcium 02/26/2013 9.1  8.4 - 10.5 mg/dL Final  . Total Protein 02/26/2013 7.1  6.0 - 8.3 g/dL Final  . Albumin 11/91/4782 3.4* 3.5 - 5.2 g/dL Final  . AST 95/62/1308 49* 0 - 37 U/L Final  . ALT 02/26/2013 77* 0 - 35 U/L Final  . Alkaline Phosphatase 02/26/2013 144* 39 - 117 U/L Final  . Total Bilirubin 02/26/2013 0.4  0.3 - 1.2 mg/dL Final  . GFR calc non Af Amer 02/26/2013 >90  >90 mL/min Final  . GFR calc Af Amer 02/26/2013 >90  >90 mL/min Final   Comment: (NOTE)                          The eGFR has been calculated using the CKD EPI equation.                          This calculation has not been validated in all clinical situations.                          eGFR's persistently <90 mL/min signify possible Chronic Kidney                          Disease.  Marland Kitchen  Prothrombin Time 02/26/2013 13.6  11.6 - 15.2 seconds Final  . INR 02/26/2013 1.06  0.00 - 1.49 Final  . ABO/RH(D) 02/26/2013 O NEG   Final  . Antibody Screen 02/26/2013 NEG   Final  . Sample Expiration 02/26/2013 03/07/2013   Final  . Color, Urine 02/26/2013 YELLOW  YELLOW Final  . APPearance 02/26/2013 CLOUDY* CLEAR Final  . Specific Gravity, Urine 02/26/2013 1.016  1.005 - 1.030 Final  . pH 02/26/2013 5.5  5.0 - 8.0 Final  . Glucose, UA 02/26/2013 250* NEGATIVE mg/dL Final  . Hgb urine dipstick 02/26/2013 NEGATIVE  NEGATIVE Final  . Bilirubin Urine 02/26/2013 NEGATIVE  NEGATIVE Final  . Ketones, ur 02/26/2013 NEGATIVE  NEGATIVE mg/dL Final  . Protein, ur 65/78/4696 NEGATIVE  NEGATIVE mg/dL Final  . Urobilinogen, UA 02/26/2013 0.2  0.0 - 1.0 mg/dL Final  . Nitrite 29/52/8413 NEGATIVE  NEGATIVE Final  . Leukocytes, UA 02/26/2013 NEGATIVE  NEGATIVE Final   MICROSCOPIC NOT DONE ON URINES WITH NEGATIVE PROTEIN, BLOOD, LEUKOCYTES, NITRITE, OR GLUCOSE <1000 mg/dL.     X-Rays:X-ray Knee Left Ap And Lateral  03/04/2013   *RADIOLOGY REPORT*  Clinical Data: Osteoarthritis of the left knee.  Total knee replacement.  LEFT KNEE - 1-2 VIEW  Comparison: None.  Findings: The components of the total knee prosthesis appear in excellent position in the AP and lateral projections.  Drain is in place.  No fractures.  IMPRESSION: Satisfactory appearance of the left knee after total knee prosthesis insertion.   Original Report Authenticated By: Francene Boyers, M.D.    EKG: Orders placed during the hospital encounter of 01/10/13  . EKG     Hospital Course: TATE JERKINS is a 47 y.o. who was admitted to [facility]. They were brought to the operating room on 03/04/2013 and underwent Procedure(s): LEFT TOTAL KNEE ARTHROPLASTY.  Patient tolerated the procedure well and was later transferred to the recovery room and then to the orthopaedic floor for postoperative care.  They were given PO and IV  analgesics for pain control following their surgery.  They were given 24 hours of postoperative  antibiotics of  Anti-infectives   Start     Dose/Rate Route Frequency Ordered Stop   03/04/13 2100  ceFAZolin (ANCEF) IVPB 1 g/50 mL premix     1 g 100 mL/hr over 30 Minutes Intravenous Every 6 hours 03/04/13 1928 03/05/13 0314   03/04/13 1532  polymyxin B 500,000 Units, bacitracin 50,000 Units in sodium chloride irrigation 0.9 % 500 mL irrigation  Status:  Discontinued       As needed 03/04/13 1532 03/04/13 1719   03/04/13 1130  ceFAZolin (ANCEF) 3 g in dextrose 5 % 50 mL IVPB     3 g 160 mL/hr over 30 Minutes Intravenous On call to O.R. 03/04/13 1123 03/04/13 1450     and started on DVT prophylaxis in the form of Xarelto.   PT and OT were ordered for total joint protocol.  Discharge planning consulted to help with postop disposition and equipment needs.  Patient had a difficult night on the evening of surgery due to pain.  They started to get up OOB with therapy on day one. Hemovac drain was pulled without difficulty.  Continued to work with therapy into day two.  Dressing was changed on day two and the incision was clean and dry.  By day three, the patient had progressed with therapy and meeting their goals.  Incision was healing well.  Patient was seen in rounds and was ready to go home.   Discharge Medications: Prior to Admission medications   Medication Sig Start Date End Date Taking? Authorizing Provider  ARIPiprazole (ABILIFY) 5 MG tablet Take 5 mg by mouth every morning.    Yes Historical Provider, MD  DULoxetine (CYMBALTA) 30 MG capsule Take 90 mg by mouth every morning.    Yes Historical Provider, MD  fesoterodine (TOVIAZ) 8 MG TB24 Take 8 mg by mouth every morning.   Yes Historical Provider, MD  furosemide (LASIX) 20 MG tablet Take 40 mg by mouth every morning.    Yes Historical Provider, MD  levothyroxine (SYNTHROID, LEVOTHROID) 150 MCG tablet Take 150 mcg by mouth every morning.    Yes  Historical Provider, MD  lisinopril (PRINIVIL,ZESTRIL) 20 MG tablet Take 20 mg by mouth every morning.    Yes Historical Provider, MD  loratadine (CLARITIN) 10 MG tablet Take 10 mg by mouth every morning.    Yes Historical Provider, MD  ferrous sulfate 325 (65 FE) MG tablet Take 1 tablet (325 mg total) by mouth 3 (three) times daily after meals. 03/07/13   Freeman Borba Tamala Ser, PA-C  HYDROmorphone (DILAUDID) 2 MG tablet Take 1-2 tablets (2-4 mg total) by mouth every 4 (four) hours as needed. 03/07/13   Partick Musselman Tamala Ser, PA-C  lidocaine (LIDODERM) 5 % Place 1-2 patches onto the skin daily as needed (Applies to back for pain.). Remove & Discard patch within 12 hours or as directed by MD    Historical Provider, MD  methocarbamol (ROBAXIN) 500 MG tablet Take 1 tablet (500 mg total) by mouth every 6 (six) hours as needed. 03/07/13   Natalie Leclaire Tamala Ser, PA-C  rivaroxaban (XARELTO) 10 MG TABS tablet Take 1 tablet (10 mg total) by mouth daily with breakfast. 03/07/13   Kerby Nora, PA-C    Diet: Regular diet Activity:WBAT Follow-up:in 2 weeks Disposition - Home Discharged Condition: good   Discharge Orders   Future Appointments Provider Department Dept Phone   03/20/2013 4:30 PM [id number] Lovie Macadamia MAMMOGRAPHY [phone]   Patient should wear two piece clothing and wear  no powder or deodorant. Patient should arrive 15 minutes early.   Future Orders Complete By Expires   Call MD / Call 911  As directed    Comments:     If you experience chest pain or shortness of breath, CALL 911 and be transported to the hospital emergency room.  If you develope a fever above 101 F, pus (white drainage) or increased drainage or redness at the wound, or calf pain, call your surgeon's office.   Constipation Prevention  As directed    Comments:     Drink plenty of fluids.  Prune juice may be helpful.  You may use a stool softener, such as Colace (over the counter) 100 mg  twice a day.  Use MiraLax (over the counter) for constipation as needed.   Diet - low sodium heart healthy  As directed    Discharge instructions  As directed    Comments:     Walk with your walker. Weight bearing as instructed. Home Health Agency will follow you at home for your therapy Do not change your dressing unless there is excess drainage Shower only, no tub bath. Call if any temperatures greater than 101 or any wound complications: [phone] during the day and ask for Dr. Jeannetta Ellis nurse, Mackey Birchwood.   Do not put a pillow under the knee. Place it under the heel.  As directed    Driving restrictions  As directed    Comments:     No driving   Increase activity slowly as tolerated  As directed        Medication List    STOP taking these medications       cyclobenzaprine 10 MG tablet  Commonly known as:  FLEXERIL     fish oil-omega-3 fatty acids 1000 MG capsule     HYDROcodone-acetaminophen 7.5-325 MG per tablet  Commonly known as:  NORCO     MELATONIN PO     multivitamin with minerals Tabs tablet     OVER THE COUNTER MEDICATION      TAKE these medications       ARIPiprazole 5 MG tablet  Commonly known as:  ABILIFY  Take 5 mg by mouth every morning.     DULoxetine 30 MG capsule  Commonly known as:  CYMBALTA  Take 90 mg by mouth every morning.     ferrous sulfate 325 (65 FE) MG tablet  Take 1 tablet (325 mg total) by mouth 3 (three) times daily after meals.     furosemide 20 MG tablet  Commonly known as:  LASIX  Take 40 mg by mouth every morning.     HYDROmorphone 2 MG tablet  Commonly known as:  DILAUDID  Take 1-2 tablets (2-4 mg total) by mouth every 4 (four) hours as needed.     levothyroxine 150 MCG tablet  Commonly known as:  SYNTHROID, LEVOTHROID  Take 150 mcg by mouth every morning.     lidocaine 5 %  Commonly known as:  LIDODERM  Place 1-2 patches onto the skin daily as needed (Applies to back for pain.). Remove & Discard patch within 12  hours or as directed by MD     lisinopril 20 MG tablet  Commonly known as:  PRINIVIL,ZESTRIL  Take 20 mg by mouth every morning.     loratadine 10 MG tablet  Commonly known as:  CLARITIN  Take 10 mg by mouth every morning.     methocarbamol 500 MG tablet  Commonly known as:  ROBAXIN  Take 1 tablet (500 mg total) by mouth every 6 (six) hours as needed.     rivaroxaban 10 MG Tabs tablet  Commonly known as:  XARELTO  Take 1 tablet (10 mg total) by mouth daily with breakfast.     TOVIAZ 8 MG Tb24 tablet  Generic drug:  fesoterodine  Take 8 mg by mouth every morning.           Follow-up Information   Follow up with GIOFFRE,RONALD A, MD. Schedule an appointment as soon as possible for a visit in 2 weeks.   Specialty:  Orthopedic Surgery   Contact information:   [address] [place] [address] [phone]       Signed: Dimitri Ped [facility] 03/18/2013, 8:16 AM

## 2013-03-20 ENCOUNTER — Other Ambulatory Visit (HOSPITAL_COMMUNITY): Payer: Self-pay | Admitting: Family Medicine

## 2013-03-20 ENCOUNTER — Ambulatory Visit (HOSPITAL_COMMUNITY)
Admission: RE | Admit: 2013-03-20 | Discharge: 2013-03-20 | Disposition: A | Discharge status: Home / Self Care | Payer: BC Managed Care – PPO | Source: Ambulatory Visit | Attending: Family Medicine | Admitting: Family Medicine

## 2013-03-20 ENCOUNTER — Ambulatory Visit (HOSPITAL_COMMUNITY): Payer: BC Managed Care – PPO

## 2013-03-20 DIAGNOSIS — Z1231 Encounter for screening mammogram for malignant neoplasm of breast: Secondary | ICD-10-CM

## 2013-03-31 ENCOUNTER — Other Ambulatory Visit (HOSPITAL_COMMUNITY): Payer: Self-pay | Admitting: Orthopedic Surgery

## 2013-03-31 ENCOUNTER — Ambulatory Visit (HOSPITAL_COMMUNITY)
Admission: RE | Admit: 2013-03-31 | Discharge: 2013-03-31 | Disposition: A | Discharge status: Home / Self Care | Payer: BC Managed Care – PPO | Source: Ambulatory Visit | Attending: Orthopedic Surgery | Admitting: Orthopedic Surgery

## 2013-03-31 DIAGNOSIS — S88119A Complete traumatic amputation at level between knee and ankle, unspecified lower leg, initial encounter: Secondary | ICD-10-CM | POA: Insufficient documentation

## 2013-03-31 DIAGNOSIS — M25569 Pain in unspecified knee: Secondary | ICD-10-CM | POA: Insufficient documentation

## 2013-03-31 DIAGNOSIS — M7989 Other specified soft tissue disorders: Secondary | ICD-10-CM

## 2013-03-31 DIAGNOSIS — M79609 Pain in unspecified limb: Secondary | ICD-10-CM

## 2013-03-31 DIAGNOSIS — M25562 Pain in left knee: Secondary | ICD-10-CM

## 2013-03-31 NOTE — Progress Notes (Signed)
VASCULAR LAB PRELIMINARY  PRELIMINARY  PRELIMINARY  PRELIMINARY  Left lower extremity venous duplex completed.    Preliminary report:  Left leg is negative for deep and superficial vein thrombosis.  Report called to Tammy.  Hampton Cost, RVT 03/31/2013, 5:24 PM    .

## 2013-05-26 ENCOUNTER — Ambulatory Visit (INDEPENDENT_AMBULATORY_CARE_PROVIDER_SITE_OTHER): Payer: BC Managed Care – PPO | Admitting: Neurology

## 2013-05-26 ENCOUNTER — Encounter: Payer: Self-pay | Admitting: Neurology

## 2013-05-26 VITALS — BP 118/80 | HR 80 | Temp 98.0°F | Resp 16 | Ht 63.0 in | Wt 305.3 lb

## 2013-05-26 DIAGNOSIS — R259 Unspecified abnormal involuntary movements: Secondary | ICD-10-CM

## 2013-05-26 DIAGNOSIS — G219 Secondary parkinsonism, unspecified: Secondary | ICD-10-CM

## 2013-05-26 DIAGNOSIS — G212 Secondary parkinsonism due to other external agents: Secondary | ICD-10-CM

## 2013-05-26 DIAGNOSIS — G2581 Restless legs syndrome: Secondary | ICD-10-CM

## 2013-05-26 DIAGNOSIS — R413 Other amnesia: Secondary | ICD-10-CM

## 2013-05-26 DIAGNOSIS — R251 Tremor, unspecified: Secondary | ICD-10-CM

## 2013-05-26 DIAGNOSIS — G4733 Obstructive sleep apnea (adult) (pediatric): Secondary | ICD-10-CM

## 2013-05-26 LAB — IBC PANEL: Iron: 59 ug/dL (ref 42–145)

## 2013-05-26 LAB — FERRITIN: Ferritin: 48.6 ng/mL (ref 10.0–291.0)
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
## 2013-05-26 NOTE — Patient Instructions (Signed)
MRI is scheduled for Thursday November 20th at 2:45pm at [facility] [phone]

## 2013-05-27 LAB — SYPHILIS: RPR W/REFLEX TO RPR TITER AND TREPONEMAL ANTIBODIES, TRADITIONAL SCREENING AND DIAGNOSIS ALGORITHM

## 2013-06-05 ENCOUNTER — Ambulatory Visit
Admission: RE | Admit: 2013-06-05 | Discharge: 2013-06-05 | Disposition: A | Discharge status: Home / Self Care | Payer: BC Managed Care – PPO | Source: Ambulatory Visit | Attending: Neurology | Admitting: Neurology

## 2013-06-05 DIAGNOSIS — R413 Other amnesia: Secondary | ICD-10-CM

## 2013-06-05 DIAGNOSIS — R251 Tremor, unspecified: Secondary | ICD-10-CM

## 2013-07-28 ENCOUNTER — Encounter: Payer: Self-pay | Admitting: Neurology

## 2013-07-28 ENCOUNTER — Ambulatory Visit (INDEPENDENT_AMBULATORY_CARE_PROVIDER_SITE_OTHER): Payer: BC Managed Care – PPO | Admitting: Neurology

## 2013-07-28 VITALS — BP 120/70 | HR 72 | Temp 98.1°F | Resp 14 | Ht 63.0 in | Wt 305.6 lb

## 2013-07-28 DIAGNOSIS — G2581 Restless legs syndrome: Secondary | ICD-10-CM

## 2013-07-28 DIAGNOSIS — G212 Secondary parkinsonism due to other external agents: Secondary | ICD-10-CM

## 2013-07-28 DIAGNOSIS — G219 Secondary parkinsonism, unspecified: Secondary | ICD-10-CM
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
## 2013-07-31 ENCOUNTER — Encounter: Payer: BC Managed Care – PPO | Attending: Family Medicine

## 2013-07-31 VITALS — Ht 63.0 in | Wt 305.0 lb

## 2013-07-31 DIAGNOSIS — Z713 Dietary counseling and surveillance: Secondary | ICD-10-CM | POA: Insufficient documentation

## 2013-07-31 DIAGNOSIS — E119 Type 2 diabetes mellitus without complications: Secondary | ICD-10-CM | POA: Insufficient documentation

## 2013-08-02 NOTE — Progress Notes (Signed)
Patient was seen on 07/31/13 for the first of a series of three diabetes self-management courses at the [facility].  Current HbA1c: 8.0%  The following learning objectives were met by the patient during this class:  Describe diabetes  State some common risk factors for diabetes  Defines the role of glucose and insulin  Identifies type of diabetes and pathophysiology  Describe the relationship between diabetes and cardiovascular risk  State the members of the Healthcare Team  States the rationale for glucose monitoring  State when to test glucose  State their individual Target Range  State the importance of logging glucose readings  Describe how to interpret glucose readings  Identifies A1C target  Explain the correlation between A1c and eAG values  State symptoms and treatment of high blood glucose  State symptoms and treatment of low blood glucose  Explain proper technique for glucose testing  Identifies proper sharps disposal  Handouts given during class include:  Living Well with Diabetes book  Carb Counting and Meal Planning book  Meal Plan Card  Carbohydrate guide  Meal planning worksheet  Low Sodium Flavoring Tips  The diabetes portion plate  Low Carbohydrate Snack Suggestions  A1c to eAG Conversion Chart  Diabetes Medications  Stress Management  Diabetes Recommended Care Schedule  Diabetes Success Plan  Core Class Satisfaction Survey  Follow-Up Plan:  Attend core 2

## 2013-08-07 DIAGNOSIS — E119 Type 2 diabetes mellitus without complications: Secondary | ICD-10-CM

## 2013-08-07 NOTE — Progress Notes (Signed)
Patient was seen on 08/07/13 for the second of a series of three diabetes self-management courses at the [facility]. The following learning objectives were met by the patient during this class:   Describe the role of different macronutrients on glucose  Explain how carbohydrates affect blood glucose  State what foods contain the most carbohydrates  Demonstrate carbohydrate counting  Demonstrate how to read Nutrition Facts food label  Describe effects of various fats on heart health  Describe the importance of good nutrition for health and healthy eating strategies  Describe techniques for managing your shopping, cooking and meal planning  List strategies to follow meal plan when dining out  Describe the effects of alcohol on glucose and how to use it safely  Goals:  Follow Diabetes Meal Plan as instructed  Eat 3 meals and 2 snacks, every 3-5 hrs  Aim for carbohydrate intake to 30-45 grams carbohydrate/meal Aim for carbohydrate intake to 0-15 grams carbohydrate/snack Add lean protein foods to meals/snacks  Monitor glucose levels as instructed by your doctor   Follow-Up Plan:  Attend Core 3  Work towards following your personal food plan.   

## 2013-08-14 DIAGNOSIS — E119 Type 2 diabetes mellitus without complications: Secondary | ICD-10-CM

## 2013-09-25 ENCOUNTER — Telehealth: Payer: Self-pay | Admitting: Internal Medicine

## 2013-09-25 NOTE — Telephone Encounter (Signed)
Spoke with the pt  She is requesting rx for new CPAP parts However, she has never been seen here before  Dr Baruch Goldmann ordered the PSG and [facility] read it  She thinks that [facility] is the one who ordered CPAP, but she is unsure  I called and spoke with [facility] and she will look into this and call us back

## 2013-09-25 NOTE — Telephone Encounter (Signed)
I spoke with Melissa and she states the pt is followed by Dr. Crista Curb so she will contact them and the pt and get her what she needs.  Bing, CMA

## 2013-11-17 ENCOUNTER — Ambulatory Visit: Payer: BC Managed Care – PPO

## 2014-01-29 NOTE — Progress Notes (Signed)
Patient was seen on 08/14/2013 for the third of a series of three diabetes self-management courses at the [facility]. The following learning objectives were met by the patient during this class:    State the amount of activity recommended for healthy living   Describe activities suitable for individual needs   Identify ways to regularly incorporate activity into daily life   Identify barriers to activity and ways to over come these barriers  Identify diabetes medications being personally used and their primary action for lowering glucose and possible side effects   Describe role of stress on blood glucose and develop strategies to address psychosocial issues   Identify diabetes complications and ways to prevent them  Explain how to manage diabetes during illness   Evaluate success in meeting personal goal   Establish 2-3 goals that they will plan to diligently work on until they return for the  4-month follow-up visit  Goals:  Follow Diabetes Meal Plan as instructed  Aim for 15-30 mins of physical activity daily as tolerated  Bring food record and glucose log to your follow up visit  Your patient has identified their diabetes self-care support plan as  [facility] Support Group  Plan:  Attend Core 4 in 4 months   

## 2014-05-09 ENCOUNTER — Encounter (HOSPITAL_COMMUNITY): Payer: Self-pay | Admitting: Emergency Medicine

## 2014-05-09 ENCOUNTER — Emergency Department (HOSPITAL_COMMUNITY)
Admission: EM | Admit: 2014-05-09 | Discharge: 2014-05-09 | Disposition: A | Discharge status: Home / Self Care | Payer: BC Managed Care – PPO | Attending: Emergency Medicine | Admitting: Emergency Medicine

## 2014-05-09 DIAGNOSIS — G8929 Other chronic pain: Secondary | ICD-10-CM

## 2014-05-09 DIAGNOSIS — M797 Fibromyalgia: Secondary | ICD-10-CM | POA: Insufficient documentation

## 2014-05-09 DIAGNOSIS — Z791 Long term (current) use of non-steroidal anti-inflammatories (NSAID): Secondary | ICD-10-CM | POA: Diagnosis not present

## 2014-05-09 DIAGNOSIS — M1990 Unspecified osteoarthritis, unspecified site: Secondary | ICD-10-CM | POA: Insufficient documentation

## 2014-05-09 DIAGNOSIS — E785 Hyperlipidemia, unspecified: Secondary | ICD-10-CM | POA: Diagnosis not present

## 2014-05-09 DIAGNOSIS — E119 Type 2 diabetes mellitus without complications: Secondary | ICD-10-CM | POA: Insufficient documentation

## 2014-05-09 DIAGNOSIS — M545 Low back pain: Secondary | ICD-10-CM | POA: Insufficient documentation

## 2014-05-09 DIAGNOSIS — F329 Major depressive disorder, single episode, unspecified: Secondary | ICD-10-CM | POA: Insufficient documentation

## 2014-05-09 DIAGNOSIS — E039 Hypothyroidism, unspecified: Secondary | ICD-10-CM | POA: Diagnosis not present

## 2014-05-09 DIAGNOSIS — Z8719 Personal history of other diseases of the digestive system: Secondary | ICD-10-CM | POA: Insufficient documentation

## 2014-05-09 DIAGNOSIS — F419 Anxiety disorder, unspecified: Secondary | ICD-10-CM | POA: Diagnosis not present

## 2014-05-09 DIAGNOSIS — Z79899 Other long term (current) drug therapy: Secondary | ICD-10-CM | POA: Diagnosis not present

## 2014-05-09 DIAGNOSIS — G4730 Sleep apnea, unspecified: Secondary | ICD-10-CM | POA: Insufficient documentation

## 2014-05-09 DIAGNOSIS — Z9981 Dependence on supplemental oxygen: Secondary | ICD-10-CM | POA: Diagnosis not present

## 2014-05-09 DIAGNOSIS — I10 Essential (primary) hypertension: Secondary | ICD-10-CM | POA: Diagnosis not present

## 2014-05-09 DIAGNOSIS — M549 Dorsalgia, unspecified: Secondary | ICD-10-CM

## 2014-05-09 LAB — URINALYSIS, ROUTINE W REFLEX MICROSCOPIC
BILIRUBIN URINE: NEGATIVE
Glucose, UA: NEGATIVE mg/dL
Hgb urine dipstick: NEGATIVE
KETONES UR: NEGATIVE mg/dL
Leukocytes, UA: NEGATIVE
NITRITE: NEGATIVE
PH: 6.5 (ref 5.0–8.0)
PROTEIN: NEGATIVE mg/dL
Specific Gravity, Urine: 1.012 (ref 1.005–1.030)
UROBILINOGEN UA: 0.2 mg/dL (ref 0.0–1.0)

## 2014-05-09 MED ORDER — ONDANSETRON HCL 4 MG/2ML IJ SOLN
4.0000 mg | Freq: Once | INTRAMUSCULAR | Status: AC
  Administered 2014-05-09: 4 mg via INTRAMUSCULAR
  Filled 2014-05-09: qty 2

## 2014-05-09 MED ORDER — KETOROLAC TROMETHAMINE 60 MG/2ML IM SOLN
60.0000 mg | Freq: Once | INTRAMUSCULAR | Status: AC
  Administered 2014-05-09: 60 mg via INTRAMUSCULAR
  Filled 2014-05-09: qty 2

## 2014-05-09 MED ORDER — OXYCODONE-ACETAMINOPHEN 5-325 MG PO TABS: 2.0000 | ORAL_TABLET | ORAL | Status: DC | PRN

## 2014-05-09 MED ORDER — HYDROMORPHONE HCL 1 MG/ML IJ SOLN
2.0000 mg | Freq: Once | INTRAMUSCULAR | Status: AC
  Administered 2014-05-09: 2 mg via INTRAMUSCULAR
  Filled 2014-05-09: qty 2

## 2014-05-09 MED ORDER — OXYCODONE-ACETAMINOPHEN 5-325 MG PO TABS
2.0000 | ORAL_TABLET | Freq: Once | ORAL | Status: AC
  Administered 2014-05-09: 2 via ORAL
  Filled 2014-05-09: qty 2

## 2014-05-09 NOTE — ED Provider Notes (Signed)
Medical screening examination/treatment/procedure(s) were performed by non-physician practitioner and as supervising physician I was immediately available for consultation/collaboration.   EKG Interpretation None        Carmin Muskrat, MD 05/09/14 2329

## 2014-05-09 NOTE — Discharge Instructions (Signed)
Back Pain, Adult Low back pain is very common. About 1 in 5 people have back pain.The cause of low back pain is rarely dangerous. The pain often gets better over time.About half of people with a sudden onset of back pain feel better in just 2 weeks. About 8 in 10 people feel better by 6 weeks.  CAUSES Some common causes of back pain include:  Strain of the muscles or ligaments supporting the spine.  Wear and tear (degeneration) of the spinal discs.  Arthritis.  Direct injury to the back. DIAGNOSIS Most of the time, the direct cause of low back pain is not known.However, back pain can be treated effectively even when the exact cause of the pain is unknown.Answering your caregiver's questions about your overall health and symptoms is one of the most accurate ways to make sure the cause of your pain is not dangerous. If your caregiver needs more information, he or she may order lab work or imaging tests (X-rays or MRIs).However, even if imaging tests show changes in your back, this usually does not require surgery. HOME CARE INSTRUCTIONS For many people, back pain returns.Since low back pain is rarely dangerous, it is often a condition that people can learn to manageon their own.   Remain active. It is stressful on the back to sit or stand in one place. Do not sit, drive, or stand in one place for more than 30 minutes at a time. Take short walks on level surfaces as soon as pain allows.Try to increase the length of time you walk each day.  Do not stay in bed.Resting more than 1 or 2 days can delay your recovery.  Do not avoid exercise or work.Your body is made to move.It is not dangerous to be active, even though your back may hurt.Your back will likely heal faster if you return to being active before your pain is gone.  Pay attention to your body when you bend and lift. Many people have less discomfortwhen lifting if they bend their knees, keep the load close to their bodies,and  avoid twisting. Often, the most comfortable positions are those that put less stress on your recovering back.  Find a comfortable position to sleep. Use a firm mattress and lie on your side with your knees slightly bent. If you lie on your back, put a pillow under your knees.  Only take over-the-counter or prescription medicines as directed by your caregiver. Over-the-counter medicines to reduce pain and inflammation are often the most helpful.Your caregiver may prescribe muscle relaxant drugs.These medicines help dull your pain so you can more quickly return to your normal activities and healthy exercise.  Put ice on the injured area.  Put ice in a plastic bag.  Place a towel between your skin and the bag.  Leave the ice on for 15-20 minutes, 03-04 times a day for the first 2 to 3 days. After that, ice and heat may be alternated to reduce pain and spasms.  Ask your caregiver about trying back exercises and gentle massage. This may be of some benefit.  Avoid feeling anxious or stressed.Stress increases muscle tension and can worsen back pain.It is important to recognize when you are anxious or stressed and learn ways to manage it.Exercise is a great option. SEEK MEDICAL CARE IF:  You have pain that is not relieved with rest or medicine.  You have pain that does not improve in 1 week.  You have new symptoms.  You are generally not feeling well. SEEK   IMMEDIATE MEDICAL CARE IF:   You have pain that radiates from your back into your legs.  You develop new bowel or bladder control problems.  You have unusual weakness or numbness in your arms or legs.  You develop nausea or vomiting.  You develop abdominal pain.  You feel faint. Document Released: 07/03/2005 Document Revised: 01/02/2012 Document Reviewed: 11/04/2013 [employer] Patient Information 2015 [employer]. This information is not intended to replace advice given to you by your health care provider. Make sure you  discuss any questions you have with your health care provider.  

## 2014-05-09 NOTE — ED Provider Notes (Signed)
CSN: [id number]     Arrival date & time 05/09/14  0456 History   First MD Initiated Contact with Patient 05/09/14 [phone]     Chief Complaint  Patient presents with  . Back Pain     (Consider location/radiation/quality/duration/timing/severity/associated sxs/prior Treatment) Patient is a 48 y.o. female presenting with back pain. The history is provided by the patient. No language interpreter was used.  Back Pain Location:  Lumbar spine Quality:  Aching and stiffness Stiffness is present:  All day Radiates to:  L posterior upper leg Pain severity:  Severe Pain is:  Same all the time Onset quality:  Gradual Timing:  Constant Progression:  Worsening Chronicity:  New Relieved by:  Nothing Worsened by:  Nothing tried Ineffective treatments:  None tried Associated symptoms: leg pain   Risk factors: obesity     Past Medical History  Diagnosis Date  . Chest pain     Nuclear, November, 2012, no ischemia, normal ejection fraction.  . Tachycardia   . Anxiety   . Dyslipidemia   . Hypothyroidism   . Depression   . Fibromyalgia   . Dizziness     RESOLVED  . Fibromyalgia muscle pain   . Ejection fraction     EF 60%, echo, 05/2011  . Bipolar 1 disorder   . Hypertension   . Sleep apnea     cpap setting at 4, not faithful  . Incontinence   . Headache(784.0)     occasional   . Arthritis   . GERD (gastroesophageal reflux disease)     OCCAS - DIET CONTROLLED  . Diabetes 07/2012  . Hyperlipidemia    Past Surgical History  Procedure Laterality Date  . Wisdom tooth extraction    . Carpal tunnel release      BILATERAL  . Cholecystectomy    . Total knee arthroplasty Right 09/17/2012    Procedure: TOTAL KNEE ARTHROPLASTY;  Surgeon: Magnus Sinning, MD;  Location: [facility];  Service: Orthopedics;  Laterality: Right;  . Knee closed reduction Right 01/10/2013    Procedure: CLOSED MANIPULATION OF RIGHT KNEE;  Surgeon: Magnus Sinning, MD;  Location: [facility];  Service: Orthopedics;   Laterality: Right;  . Tonsillectomy    . Total knee arthroplasty Left 03/04/2013    Procedure: LEFT TOTAL KNEE ARTHROPLASTY;  Surgeon: Tobi Bastos, MD;  Location: [facility];  Service: Orthopedics;  Laterality: Left;   Family History  Problem Relation Age of Onset  . Adopted: Yes  . Stroke    . Depression Mother    History  Substance Use Topics  . Smoking status: Never Smoker   . Smokeless tobacco: Never Used  . Alcohol Use: No     Comment: nothing to drink in 3 years , never heavy EtOH   OB History   Grav Para Term Preterm Abortions TAB SAB Ect Mult Living                 Review of Systems  Musculoskeletal: Positive for back pain.  All other systems reviewed and are negative.     Allergies  Prednisolone; Prednisone; and Vicodin  Home Medications   Prior to Admission medications   Medication Sig Start Date End Date Taking? Authorizing Provider  albuterol (PROVENTIL) (2.5 MG/3ML) 0.083% nebulizer solution Take 2.5 mg by nebulization every 4 (four) hours as needed for wheezing or shortness of breath.   Yes Historical Provider, MD  atorvastatin (LIPITOR) 10 MG tablet Take 10 mg by mouth daily.   Yes Historical  Provider, MD  cyclobenzaprine (FLEXERIL) 10 MG tablet Take 10 mg by mouth 3 (three) times daily as needed for muscle spasms.   Yes Historical Provider, MD  DULoxetine (CYMBALTA) 60 MG capsule Take 120 mg by mouth daily.   Yes Historical Provider, MD  fish oil-omega-3 fatty acids 1000 MG capsule Take 1 g by mouth daily.   Yes Historical Provider, MD  furosemide (LASIX) 20 MG tablet Take 40 mg by mouth every morning.    Yes Historical Provider, MD  Glucosamine-Chondroit-Vit C-Mn (GLUCOSAMINE 1500 COMPLEX PO) Take 1 tablet by mouth daily.   Yes Historical Provider, MD  HYDROcodone-acetaminophen (NORCO) 7.5-325 MG per tablet Take 1 tablet by mouth every 6 (six) hours as needed for moderate pain.   Yes Historical Provider, MD  indomethacin (INDOCIN) 25 MG capsule Take 50 mg  by mouth every 12 (twelve) hours.   Yes Historical Provider, MD  levothyroxine (SYNTHROID, LEVOTHROID) 150 MCG tablet Take 150 mcg by mouth every morning.    Yes Historical Provider, MD  lidocaine (LIDODERM) 5 % Place 1-2 patches onto the skin daily as needed (Applies to back for pain.). Remove & Discard patch within 12 hours or as directed by MD   Yes Historical Provider, MD  lisinopril (PRINIVIL,ZESTRIL) 20 MG tablet Take 20 mg by mouth every morning.    Yes Historical Provider, MD  metFORMIN (GLUMETZA) 1000 MG (MOD) 24 hr tablet Take 2,000 mg by mouth daily with breakfast.   Yes Historical Provider, MD  multivitamin-iron-minerals-folic acid (CENTRUM) chewable tablet Chew 1 tablet by mouth daily.   Yes Historical Provider, MD  tolterodine (DETROL LA) 4 MG 24 hr capsule Take 8 mg by mouth daily.    Yes Historical Provider, MD  zolpidem (AMBIEN) 5 MG tablet Take 5 mg by mouth at bedtime as needed for sleep.   Yes Historical Provider, MD   BP 117/71  Pulse 99  Temp(Src) 98.4 F (36.9 C) (Oral)  Resp 15  Ht 5\' 3"  (1.6 m)  Wt 284 lb (128.822 kg)  BMI 50.32 kg/m2  SpO2 98%  LMP 04/25/2014 Physical Exam  Constitutional: She appears well-developed and well-nourished.  HENT:  Head: Normocephalic.  Cardiovascular: Normal rate.   Pulmonary/Chest: Effort normal.  Abdominal: Soft.  Musculoskeletal: Normal range of motion.  Neurological: She is alert.  Skin: Skin is warm.    ED Course  Procedures (including critical care time) Labs Review Labs Reviewed  URINALYSIS, ROUTINE W REFLEX MICROSCOPIC   Results for orders placed during the hospital encounter of 05/09/14  URINALYSIS, ROUTINE W REFLEX MICROSCOPIC      Result Value Ref Range   Color, Urine YELLOW  YELLOW   APPearance CLEAR  CLEAR   Specific Gravity, Urine 1.012  1.005 - 1.030   pH 6.5  5.0 - 8.0   Glucose, UA NEGATIVE  NEGATIVE mg/dL   Hgb urine dipstick NEGATIVE  NEGATIVE   Bilirubin Urine NEGATIVE  NEGATIVE   Ketones, ur  NEGATIVE  NEGATIVE mg/dL   Protein, ur NEGATIVE  NEGATIVE mg/dL   Urobilinogen, UA 0.2  0.0 - 1.0 mg/dL   Nitrite NEGATIVE  NEGATIVE   Leukocytes, UA NEGATIVE  NEGATIVE   No results found.  Imaging Review No results found.   EKG Interpretation None      MDM  Pt is followed by Dr. Rolena Infante at [facility] and Dr. Nelva Bush.  Pt had MRi of neck and back.  Pt reports she was told she has multiple areas of disease but not a candidate  for surgery due to comorbitities.   Pt given torodol, dilaudid and zofran.   Pt denies having a pain management contract with Dr. Nelva Bush.  Pt reports she has not signed any type of contract.  I advised her I would try her on Percocet.  I advised call [facility] Orthopaedist to be seen on Monday.   Final diagnoses:  Chronic back pain        Fransico Meadow, PA-C 05/09/14 1057

## 2014-05-09 NOTE — ED Notes (Addendum)
Pt states she has a bulging disc.  Pt states she has "flares" every once in a while.  Pt states she began having L lower back pain radiating to L buttock yesterday.  Pt states she took Hydrocodone 7.5/325 x 2 and Flexeril 10mg  x 2 along with ice with no relief.  Pt states she recently had steroid injections in her upper back.

## 2014-06-16 ENCOUNTER — Other Ambulatory Visit: Payer: Self-pay | Admitting: Surgical

## 2014-07-03 NOTE — Patient Instructions (Addendum)
[name]  07/03/2014   Your procedure is scheduled on: 07/16/14    Report to [facility]  Entrance and follow signs to               [place] at   39 AM. Go to white Valet Tent   Call this number if you have problems the morning of surgery [phone]   Remember:Eat a good healthy snack prior to bedtime.   Do not eat food or drink liquids :After Midnight.     Take these medicines the morning of surgery with A SIP OF WATER: Albuterol Inhaler if needed and bring with you, cymbalta, synthroid, Percocet if needed                                You may not have any metal on your body including hair pins and              piercings  Do not wear jewelry, make-up, lotions, powders or perfumes.             Do not wear nail polish.  Do not shave  48 hours prior to surgery.                           Bring CPAP mask and tubing   Do not bring valuables to the hospital. [place].  Contacts, dentures or bridgework may not be worn into surgery.  Leave suitcase in the car. After surgery it may be brought to your room.         Special Instructions: coughing and deep breathing exercises, leg exercises               Please read over the following fact sheets you were given: _____________________________________________________________________             [facility] - Preparing for Surgery Before surgery, you can play an important role.  Because skin is not sterile, your skin needs to be as free of germs as possible.  You can reduce the number of germs on your skin by washing with CHG (chlorahexidine gluconate) soap before surgery.  CHG is an antiseptic cleaner which kills germs and bonds with the skin to continue killing germs even after washing. Please DO NOT use if you have an allergy to CHG or antibacterial soaps.  If your skin becomes reddened/irritated stop using the CHG and inform your nurse when you  arrive at Short Stay. Do not shave (including legs and underarms) for at least 48 hours prior to the first CHG shower.  You may shave your face/neck. Please follow these instructions carefully:  1.  Shower with CHG Soap the night before surgery and the  morning of Surgery.  2.  If you choose to wash your hair, wash your hair first as usual with your  normal  shampoo.  3.  After you shampoo, rinse your hair and body thoroughly to remove the  shampoo.                           4.  Use CHG as you would any other liquid soap.  You can apply chg  directly  to the skin and wash                       Gently with a scrungie or clean washcloth.  5.  Apply the CHG Soap to your body ONLY FROM THE NECK DOWN.   Do not use on face/ open                           Wound or open sores. Avoid contact with eyes, ears mouth and genitals (private parts).                       Wash face,  Genitals (private parts) with your normal soap.             6.  Wash thoroughly, paying special attention to the area where your surgery  will be performed.  7.  Thoroughly rinse your body with warm water from the neck down.  8.  DO NOT shower/wash with your normal soap after using and rinsing off  the CHG Soap.                9.  Pat yourself dry with a clean towel.            10.  Wear clean pajamas.            11.  Place clean sheets on your bed the night of your first shower and do not  sleep with pets. Day of Surgery : Do not apply any lotions/deodorants the morning of surgery.  Please wear clean clothes to the hospital/surgery center.  FAILURE TO FOLLOW THESE INSTRUCTIONS MAY RESULT IN THE CANCELLATION OF YOUR SURGERY PATIENT SIGNATURE_________________________________  NURSE SIGNATURE__________________________________  ________________________________________________________________________   Adam Phenix  An incentive spirometer is a tool that can help keep your lungs clear and active. This tool measures how  well you are filling your lungs with each breath. Taking long deep breaths may help reverse or decrease the chance of developing breathing (pulmonary) problems (especially infection) following:  A long period of time when you are unable to move or be active. BEFORE THE PROCEDURE   If the spirometer includes an indicator to show your best effort, your nurse or respiratory therapist will set it to a desired goal.  If possible, sit up straight or lean slightly forward. Try not to slouch.  Hold the incentive spirometer in an upright position. INSTRUCTIONS FOR USE  1. Sit on the edge of your bed if possible, or sit up as far as you can in bed or on a chair. 2. Hold the incentive spirometer in an upright position. 3. Breathe out normally. 4. Place the mouthpiece in your mouth and seal your lips tightly around it. 5. Breathe in slowly and as deeply as possible, raising the piston or the ball toward the top of the column. 6. Hold your breath for 3-5 seconds or for as long as possible. Allow the piston or ball to fall to the bottom of the column. 7. Remove the mouthpiece from your mouth and breathe out normally. 8. Rest for a few seconds and repeat Steps 1 through 7 at least 10 times every 1-2 hours when you are awake. Take your time and take a few normal breaths between deep breaths. 9. The spirometer may include an indicator to show your best effort. Use the indicator as a goal to work toward during each repetition. 10.  After each set of 10 deep breaths, practice coughing to be sure your lungs are clear. If you have an incision (the cut made at the time of surgery), support your incision when coughing by placing a pillow or rolled up towels firmly against it. Once you are able to get out of bed, walk around indoors and cough well. You may stop using the incentive spirometer when instructed by your caregiver.  RISKS AND COMPLICATIONS  Take your time so you do not get dizzy or light-headed.  If you  are in pain, you may need to take or ask for pain medication before doing incentive spirometry. It is harder to take a deep breath if you are having pain. AFTER USE  Rest and breathe slowly and easily.  It can be helpful to keep track of a log of your progress. Your caregiver can provide you with a simple table to help with this. If you are using the spirometer at home, follow these instructions: [place] IF:   You are having difficultly using the spirometer.  You have trouble using the spirometer as often as instructed.  Your pain medication is not giving enough relief while using the spirometer.  You develop fever of 100.5 F (38.1 C) or higher. SEEK IMMEDIATE MEDICAL CARE IF:   You cough up bloody sputum that had not been present before.  You develop fever of 102 F (38.9 C) or greater.  You develop worsening pain at or near the incision site. MAKE SURE YOU:   Understand these instructions.  Will watch your condition.  Will get help right away if you are not doing well or get worse. Document Released: 11/13/2006 Document Revised: 09/25/2011 Document Reviewed: 01/14/2007 [facility] Patient Information 2014 [place], [place].   ________________________________________________________________________

## 2014-07-06 ENCOUNTER — Encounter (HOSPITAL_COMMUNITY): Payer: Self-pay

## 2014-07-06 ENCOUNTER — Other Ambulatory Visit: Payer: Self-pay

## 2014-07-06 ENCOUNTER — Encounter (HOSPITAL_COMMUNITY)
Admission: RE | Admit: 2014-07-06 | Discharge: 2014-07-06 | Disposition: A | Discharge status: Home / Self Care | Payer: BC Managed Care – PPO | Source: Ambulatory Visit | Attending: Orthopedic Surgery | Admitting: Orthopedic Surgery

## 2014-07-06 ENCOUNTER — Ambulatory Visit (HOSPITAL_COMMUNITY)
Admission: RE | Admit: 2014-07-06 | Discharge: 2014-07-06 | Disposition: A | Discharge status: Home / Self Care | Payer: BC Managed Care – PPO | Source: Ambulatory Visit | Attending: Surgical | Admitting: Surgical

## 2014-07-06 DIAGNOSIS — Z01818 Encounter for other preprocedural examination: Secondary | ICD-10-CM

## 2014-07-06 DIAGNOSIS — E119 Type 2 diabetes mellitus without complications: Secondary | ICD-10-CM | POA: Insufficient documentation

## 2014-07-06 DIAGNOSIS — I10 Essential (primary) hypertension: Secondary | ICD-10-CM | POA: Diagnosis not present

## 2014-07-06 DIAGNOSIS — R000 Tachycardia, unspecified: Secondary | ICD-10-CM | POA: Insufficient documentation

## 2014-07-06 LAB — CBC WITH DIFFERENTIAL/PLATELET
Basophils Absolute: 0 10*3/uL (ref 0.0–0.1)
Basophils Relative: 0 % (ref 0–1)
Eosinophils Absolute: 0.1 10*3/uL (ref 0.0–0.7)
Eosinophils Relative: 2 % (ref 0–5)
HCT: 42.1 % (ref 36.0–46.0)
Hemoglobin: 13.4 g/dL (ref 12.0–15.0)
Lymphocytes Relative: 26 % (ref 12–46)
Lymphs Abs: 1.8 10*3/uL (ref 0.7–4.0)
MCH: 30 pg (ref 26.0–34.0)
MCHC: 31.8 g/dL (ref 30.0–36.0)
MCV: 94.4 fL (ref 78.0–100.0)
Monocytes Absolute: 0.4 10*3/uL (ref 0.1–1.0)
Monocytes Relative: 6 % (ref 3–12)
Neutro Abs: 4.5 10*3/uL (ref 1.7–7.7)
Neutrophils Relative %: 66 % (ref 43–77)
Platelets: 253 10*3/uL (ref 150–400)
RBC: 4.46 MIL/uL (ref 3.87–5.11)
RDW: 14.2 % (ref 11.5–15.5)
WBC: 6.9 10*3/uL (ref 4.0–10.5)

## 2014-07-06 LAB — PROTIME-INR
INR: 0.97 (ref 0.00–1.49)
Prothrombin Time: 13 seconds (ref 11.6–15.2)

## 2014-07-06 LAB — URINALYSIS, ROUTINE W REFLEX MICROSCOPIC
Bilirubin Urine: NEGATIVE
Glucose, UA: NEGATIVE mg/dL
Hgb urine dipstick: NEGATIVE
Ketones, ur: NEGATIVE mg/dL
Leukocytes, UA: NEGATIVE
Nitrite: NEGATIVE
Protein, ur: NEGATIVE mg/dL
Specific Gravity, Urine: 1.025 (ref 1.005–1.030)
Urobilinogen, UA: 2 mg/dL — ABNORMAL HIGH (ref 0.0–1.0)
pH: 6 (ref 5.0–8.0)

## 2014-07-06 LAB — COMPREHENSIVE METABOLIC PANEL
ALT: 31 U/L (ref 0–35)
AST: 26 U/L (ref 0–37)
Albumin: 3.8 g/dL (ref 3.5–5.2)
Alkaline Phosphatase: 88 U/L (ref 39–117)
Anion gap: 12 (ref 5–15)
BUN: 22 mg/dL (ref 6–23)
CO2: 27 mEq/L (ref 19–32)
Calcium: 9.8 mg/dL (ref 8.4–10.5)
Chloride: 95 mEq/L — ABNORMAL LOW (ref 96–112)
Creatinine, Ser: 0.69 mg/dL (ref 0.50–1.10)
GFR calc Af Amer: >90 mL/min (ref >90)
GFR calc non Af Amer: >90 mL/min (ref >90)
Glucose, Bld: 107 mg/dL — ABNORMAL HIGH (ref 70–99)
Potassium: 4.5 mEq/L (ref 3.7–5.3)
Sodium: 134 mEq/L — ABNORMAL LOW (ref 137–147)
Total Bilirubin: 0.4 mg/dL (ref 0.3–1.2)
Total Protein: 7.2 g/dL (ref 6.0–8.3)

## 2014-07-06 LAB — APTT: aPTT: 34 seconds (ref 24–37)

## 2014-07-06 LAB — SURGICAL PCR SCREEN
MRSA, PCR: NEGATIVE
STAPHYLOCOCCUS AUREUS: NEGATIVE

## 2014-07-06 NOTE — Progress Notes (Signed)
Sleep Study documents- 2013 in [place].

## 2014-07-08 NOTE — Progress Notes (Signed)
Consulted Dr. Lissa Hoard about EKG and medical history- per Dr. Myrlene Broker for surgery"

## 2014-07-15 MED ORDER — DEXTROSE 5 % IV SOLN
3.0000 g | INTRAVENOUS | Status: AC
  Administered 2014-07-16: 3 g via INTRAVENOUS
  Filled 2014-07-15: qty 3000

## 2014-07-16 ENCOUNTER — Ambulatory Visit (HOSPITAL_COMMUNITY): Payer: BC Managed Care – PPO | Admitting: Anesthesiology

## 2014-07-16 ENCOUNTER — Encounter (HOSPITAL_COMMUNITY)
Admission: RE | Disposition: A | Discharge status: Home / Self Care | Payer: Self-pay | Source: Ambulatory Visit | Attending: Orthopedic Surgery

## 2014-07-16 ENCOUNTER — Encounter (HOSPITAL_COMMUNITY): Payer: Self-pay | Admitting: *Deleted

## 2014-07-16 ENCOUNTER — Inpatient Hospital Stay (HOSPITAL_COMMUNITY)
Admission: RE | Admit: 2014-07-16 | Discharge: 2014-07-17 | DRG: 501 | Disposition: A | Discharge status: Home / Self Care | Payer: BC Managed Care – PPO | Source: Ambulatory Visit | Attending: Orthopedic Surgery | Admitting: Orthopedic Surgery

## 2014-07-16 DIAGNOSIS — M75100 Unspecified rotator cuff tear or rupture of unspecified shoulder, not specified as traumatic: Secondary | ICD-10-CM | POA: Diagnosis present

## 2014-07-16 DIAGNOSIS — M25511 Pain in right shoulder: Secondary | ICD-10-CM | POA: Diagnosis present

## 2014-07-16 DIAGNOSIS — F319 Bipolar disorder, unspecified: Secondary | ICD-10-CM | POA: Diagnosis present

## 2014-07-16 DIAGNOSIS — M797 Fibromyalgia: Secondary | ICD-10-CM | POA: Diagnosis present

## 2014-07-16 DIAGNOSIS — E119 Type 2 diabetes mellitus without complications: Secondary | ICD-10-CM | POA: Diagnosis present

## 2014-07-16 DIAGNOSIS — M89011 Algoneurodystrophy, right shoulder: Secondary | ICD-10-CM | POA: Diagnosis present

## 2014-07-16 DIAGNOSIS — E785 Hyperlipidemia, unspecified: Secondary | ICD-10-CM | POA: Diagnosis present

## 2014-07-16 DIAGNOSIS — M75101 Unspecified rotator cuff tear or rupture of right shoulder, not specified as traumatic: Principal | ICD-10-CM | POA: Diagnosis present

## 2014-07-16 DIAGNOSIS — Z6841 Body Mass Index (BMI) 40.0 and over, adult: Secondary | ICD-10-CM | POA: Diagnosis not present

## 2014-07-16 DIAGNOSIS — G4730 Sleep apnea, unspecified: Secondary | ICD-10-CM | POA: Diagnosis present

## 2014-07-16 DIAGNOSIS — K219 Gastro-esophageal reflux disease without esophagitis: Secondary | ICD-10-CM | POA: Diagnosis present

## 2014-07-16 DIAGNOSIS — M1990 Unspecified osteoarthritis, unspecified site: Secondary | ICD-10-CM | POA: Diagnosis present

## 2014-07-16 DIAGNOSIS — M7551 Bursitis of right shoulder: Secondary | ICD-10-CM | POA: Diagnosis present

## 2014-07-16 DIAGNOSIS — M7541 Impingement syndrome of right shoulder: Secondary | ICD-10-CM

## 2014-07-16 DIAGNOSIS — E039 Hypothyroidism, unspecified: Secondary | ICD-10-CM | POA: Diagnosis present

## 2014-07-16 DIAGNOSIS — I10 Essential (primary) hypertension: Secondary | ICD-10-CM | POA: Diagnosis present

## 2014-07-16 DIAGNOSIS — M7540 Impingement syndrome of unspecified shoulder: Secondary | ICD-10-CM | POA: Diagnosis present

## 2014-07-16 HISTORY — PX: SHOULDER OPEN ROTATOR CUFF REPAIR: SHX2407

## 2014-07-16 LAB — GLUCOSE, CAPILLARY
GLUCOSE-CAPILLARY: 112 mg/dL — AB (ref 70–99)
GLUCOSE-CAPILLARY: 120 mg/dL — AB (ref 70–99)
Glucose-Capillary: 111 mg/dL — ABNORMAL HIGH (ref 70–99)
Glucose-Capillary: 193 mg/dL — ABNORMAL HIGH (ref 70–99)

## 2014-07-16 SURGERY — REPAIR, ROTATOR CUFF, OPEN
Anesthesia: General | Site: Shoulder | Laterality: Right

## 2014-07-16 MED ORDER — SODIUM CHLORIDE 0.9 % IR SOLN
Status: DC | PRN
  Administered 2014-07-16: 500 mL

## 2014-07-16 MED ORDER — DEXAMETHASONE SODIUM PHOSPHATE 10 MG/ML IJ SOLN
INTRAMUSCULAR | Status: AC
  Filled 2014-07-16: qty 1

## 2014-07-16 MED ORDER — ONDANSETRON HCL 4 MG PO TABS: 4.0000 mg | ORAL_TABLET | Freq: Four times a day (QID) | ORAL | Status: DC | PRN

## 2014-07-16 MED ORDER — ONDANSETRON HCL 4 MG/2ML IJ SOLN
INTRAMUSCULAR | Status: AC
  Filled 2014-07-16: qty 2

## 2014-07-16 MED ORDER — OXYCODONE-ACETAMINOPHEN 5-325 MG PO TABS
1.0000 | ORAL_TABLET | ORAL | Status: DC | PRN
  Administered 2014-07-16 – 2014-07-17 (×3): 1 via ORAL
  Filled 2014-07-16 (×3): qty 1

## 2014-07-16 MED ORDER — OXYCODONE-ACETAMINOPHEN 5-325 MG PO TABS: 1.0000 | ORAL_TABLET | ORAL | Status: DC | PRN

## 2014-07-16 MED ORDER — SODIUM CHLORIDE 0.9 % IJ SOLN
INTRAMUSCULAR | Status: AC
  Filled 2014-07-16: qty 20

## 2014-07-16 MED ORDER — CEFAZOLIN SODIUM 1-5 GM-% IV SOLN
1.0000 g | Freq: Four times a day (QID) | INTRAVENOUS | Status: AC
  Administered 2014-07-16 – 2014-07-17 (×3): 1 g via INTRAVENOUS
  Filled 2014-07-16 (×3): qty 50

## 2014-07-16 MED ORDER — ROPIVACAINE HCL 5 MG/ML IJ SOLN
INTRAMUSCULAR | Status: AC
  Filled 2014-07-16: qty 30

## 2014-07-16 MED ORDER — LIDOCAINE HCL (PF) 2 % IJ SOLN
INTRAMUSCULAR | Status: DC | PRN
  Administered 2014-07-16: 20 mg via INTRADERMAL

## 2014-07-16 MED ORDER — OXYCODONE HCL 5 MG PO TABS
5.0000 mg | ORAL_TABLET | ORAL | Status: DC | PRN
  Administered 2014-07-17: 5 mg via ORAL
  Filled 2014-07-16: qty 1

## 2014-07-16 MED ORDER — HYDROMORPHONE HCL 1 MG/ML IJ SOLN: 0.5000 mg | INTRAMUSCULAR | Status: DC | PRN

## 2014-07-16 MED ORDER — METHOCARBAMOL 500 MG PO TABS
500.0000 mg | ORAL_TABLET | Freq: Four times a day (QID) | ORAL | Status: DC | PRN
  Administered 2014-07-16 – 2014-07-17 (×2): 500 mg via ORAL
  Filled 2014-07-16 (×2): qty 1

## 2014-07-16 MED ORDER — ONDANSETRON HCL 4 MG/2ML IJ SOLN: 4.0000 mg | Freq: Four times a day (QID) | INTRAMUSCULAR | Status: DC | PRN

## 2014-07-16 MED ORDER — ONDANSETRON HCL 4 MG/2ML IJ SOLN
INTRAMUSCULAR | Status: DC | PRN
  Administered 2014-07-16: 4 mg via INTRAVENOUS

## 2014-07-16 MED ORDER — OXYCODONE-ACETAMINOPHEN 10-325 MG PO TABS: 1.0000 | ORAL_TABLET | ORAL | Status: DC | PRN

## 2014-07-16 MED ORDER — HYDROMORPHONE HCL 1 MG/ML IJ SOLN
INTRAMUSCULAR | Status: AC
  Filled 2014-07-16: qty 1

## 2014-07-16 MED ORDER — METOCLOPRAMIDE HCL 5 MG/ML IJ SOLN: 5.0000 mg | Freq: Three times a day (TID) | INTRAMUSCULAR | Status: DC | PRN

## 2014-07-16 MED ORDER — PHENOL 1.4 % MT LIQD
1.0000 | OROMUCOSAL | Status: DC | PRN
  Filled 2014-07-16: qty 177

## 2014-07-16 MED ORDER — DARIFENACIN HYDROBROMIDE ER 7.5 MG PO TB24
7.5000 mg | ORAL_TABLET | Freq: Every day | ORAL | Status: DC
  Administered 2014-07-16 – 2014-07-17 (×2): 7.5 mg via ORAL
  Filled 2014-07-16 (×2): qty 1

## 2014-07-16 MED ORDER — THROMBIN 5000 UNITS EX SOLR
CUTANEOUS | Status: AC
  Filled 2014-07-16: qty 5000

## 2014-07-16 MED ORDER — FUROSEMIDE 20 MG PO TABS
20.0000 mg | ORAL_TABLET | Freq: Every morning | ORAL | Status: DC
  Administered 2014-07-17: 40 mg via ORAL
  Filled 2014-07-16: qty 2

## 2014-07-16 MED ORDER — POLYETHYLENE GLYCOL 3350 17 G PO PACK: 17.0000 g | PACK | Freq: Every day | ORAL | Status: DC | PRN

## 2014-07-16 MED ORDER — METHOCARBAMOL 500 MG PO TABS: 500.0000 mg | ORAL_TABLET | Freq: Four times a day (QID) | ORAL | Status: DC | PRN

## 2014-07-16 MED ORDER — SODIUM CHLORIDE 0.9 % IR SOLN
Status: AC
  Filled 2014-07-16: qty 1

## 2014-07-16 MED ORDER — MIDAZOLAM HCL 2 MG/2ML IJ SOLN
INTRAMUSCULAR | Status: AC
Start: 2014-07-16 — End: 2014-07-16
  Filled 2014-07-16: qty 2

## 2014-07-16 MED ORDER — HYDROMORPHONE HCL 1 MG/ML IJ SOLN
0.2500 mg | INTRAMUSCULAR | Status: DC | PRN
  Administered 2014-07-16 (×2): 0.5 mg via INTRAVENOUS

## 2014-07-16 MED ORDER — PHENYLEPHRINE HCL 10 MG/ML IJ SOLN
INTRAMUSCULAR | Status: DC | PRN
  Administered 2014-07-16 (×2): 80 ug via INTRAVENOUS

## 2014-07-16 MED ORDER — MIDAZOLAM HCL 5 MG/5ML IJ SOLN
INTRAMUSCULAR | Status: DC | PRN
  Administered 2014-07-16 (×2): 2 mg via INTRAVENOUS

## 2014-07-16 MED ORDER — LACTATED RINGERS IV SOLN
INTRAVENOUS | Status: DC
  Administered 2014-07-16: 1000 mL via INTRAVENOUS

## 2014-07-16 MED ORDER — LEVOTHYROXINE SODIUM 150 MCG PO TABS
150.0000 ug | ORAL_TABLET | Freq: Every morning | ORAL | Status: DC
  Filled 2014-07-16 (×3): qty 1

## 2014-07-16 MED ORDER — LISINOPRIL 20 MG PO TABS
20.0000 mg | ORAL_TABLET | Freq: Every morning | ORAL | Status: DC
  Administered 2014-07-17: 20 mg via ORAL
  Filled 2014-07-16: qty 1

## 2014-07-16 MED ORDER — ALBUTEROL SULFATE (2.5 MG/3ML) 0.083% IN NEBU: 2.5000 mg | INHALATION_SOLUTION | Freq: Four times a day (QID) | RESPIRATORY_TRACT | Status: DC | PRN

## 2014-07-16 MED ORDER — ACETAMINOPHEN 650 MG RE SUPP: 650.0000 mg | Freq: Four times a day (QID) | RECTAL | Status: DC | PRN

## 2014-07-16 MED ORDER — PROPOFOL 10 MG/ML IV BOLUS
INTRAVENOUS | Status: AC
  Filled 2014-07-16: qty 20

## 2014-07-16 MED ORDER — LACTATED RINGERS IV SOLN: INTRAVENOUS | Status: DC

## 2014-07-16 MED ORDER — METOCLOPRAMIDE HCL 10 MG PO TABS: 5.0000 mg | ORAL_TABLET | Freq: Three times a day (TID) | ORAL | Status: DC | PRN

## 2014-07-16 MED ORDER — ACETAMINOPHEN 325 MG PO TABS: 650.0000 mg | ORAL_TABLET | Freq: Four times a day (QID) | ORAL | Status: DC | PRN

## 2014-07-16 MED ORDER — ZOLPIDEM TARTRATE 5 MG PO TABS
5.0000 mg | ORAL_TABLET | Freq: Every evening | ORAL | Status: DC | PRN
  Administered 2014-07-16: 5 mg via ORAL
  Filled 2014-07-16: qty 1

## 2014-07-16 MED ORDER — FENTANYL CITRATE 0.05 MG/ML IJ SOLN
INTRAMUSCULAR | Status: DC | PRN
  Administered 2014-07-16 (×3): 50 ug via INTRAVENOUS

## 2014-07-16 MED ORDER — BUPIVACAINE-EPINEPHRINE (PF) 0.5% -1:200000 IJ SOLN
INTRAMUSCULAR | Status: AC
  Filled 2014-07-16: qty 30

## 2014-07-16 MED ORDER — SUCCINYLCHOLINE CHLORIDE 20 MG/ML IJ SOLN
INTRAMUSCULAR | Status: DC | PRN
  Administered 2014-07-16: 20 mg via INTRAVENOUS
  Administered 2014-07-16: 100 mg via INTRAVENOUS

## 2014-07-16 MED ORDER — INSULIN ASPART 100 UNIT/ML ~~LOC~~ SOLN: 0.0000 [IU] | Freq: Three times a day (TID) | SUBCUTANEOUS | Status: DC

## 2014-07-16 MED ORDER — DEXTROSE 5 % IV SOLN
500.0000 mg | Freq: Four times a day (QID) | INTRAVENOUS | Status: DC | PRN
  Administered 2014-07-16: 500 mg via INTRAVENOUS
  Filled 2014-07-16 (×2): qty 5

## 2014-07-16 MED ORDER — FENTANYL CITRATE 0.05 MG/ML IJ SOLN
INTRAMUSCULAR | Status: AC
  Filled 2014-07-16: qty 2

## 2014-07-16 MED ORDER — DULOXETINE HCL 60 MG PO CPEP
120.0000 mg | ORAL_CAPSULE | Freq: Every morning | ORAL | Status: DC
  Administered 2014-07-17: 120 mg via ORAL
  Filled 2014-07-16: qty 2

## 2014-07-16 MED ORDER — THROMBIN 5000 UNITS EX SOLR
OROMUCOSAL | Status: DC | PRN
  Administered 2014-07-16: 14:00:00 via TOPICAL

## 2014-07-16 MED ORDER — DEXAMETHASONE SODIUM PHOSPHATE 10 MG/ML IJ SOLN
INTRAMUSCULAR | Status: DC | PRN
  Administered 2014-07-16: 10 mg via INTRAVENOUS

## 2014-07-16 MED ORDER — PROPOFOL 10 MG/ML IV BOLUS
INTRAVENOUS | Status: DC | PRN
  Administered 2014-07-16: 50 mg via INTRAVENOUS
  Administered 2014-07-16: 150 mg via INTRAVENOUS

## 2014-07-16 MED ORDER — MIDAZOLAM HCL 2 MG/2ML IJ SOLN
INTRAMUSCULAR | Status: AC
  Filled 2014-07-16: qty 2

## 2014-07-16 MED ORDER — MENTHOL 3 MG MT LOZG
1.0000 | LOZENGE | OROMUCOSAL | Status: DC | PRN
  Filled 2014-07-16: qty 9

## 2014-07-16 MED ORDER — LIDOCAINE HCL (CARDIAC) 20 MG/ML IV SOLN
INTRAVENOUS | Status: AC
  Filled 2014-07-16: qty 5

## 2014-07-16 MED ORDER — BUPIVACAINE-EPINEPHRINE (PF) 0.5% -1:200000 IJ SOLN
INTRAMUSCULAR | Status: DC | PRN
  Administered 2014-07-16: 25 mL via PERINEURAL

## 2014-07-16 MED ORDER — METFORMIN HCL ER 750 MG PO TB24
2000.0000 mg | ORAL_TABLET | Freq: Every day | ORAL | Status: DC
  Administered 2014-07-16: 2000 mg via ORAL
  Administered 2014-07-17: 1000 mg via ORAL
  Filled 2014-07-16 (×3): qty 1

## 2014-07-16 SURGICAL SUPPLY — 40 items
BLADE OSCILLATING/SAGITTAL (BLADE) ×2
BLADE SW THK.38XMED LNG THN (BLADE) ×1 IMPLANT
BNDG COHESIVE 6X5 TAN NS LF (GAUZE/BANDAGES/DRESSINGS) ×2 IMPLANT
BUR OVAL CARBIDE 4.0 (BURR) ×2 IMPLANT
CLEANER TIP ELECTROSURG 2X2 (MISCELLANEOUS) ×2 IMPLANT
DRAPE POUCH INSTRU U-SHP 10X18 (DRAPES) ×2 IMPLANT
DRSG AQUACEL AG ADV 3.5X 6 (GAUZE/BANDAGES/DRESSINGS) ×2 IMPLANT
DURAPREP 26ML APPLICATOR (WOUND CARE) ×2 IMPLANT
ELECT BLADE TIP CTD 4 INCH (ELECTRODE) ×1 IMPLANT
ELECT REM PT RETURN 9FT ADLT (ELECTROSURGICAL) ×2
ELECTRODE REM PT RTRN 9FT ADLT (ELECTROSURGICAL) ×1 IMPLANT
GLOVE BIO SURGEON STRL SZ7 (GLOVE) ×1 IMPLANT
GLOVE BIOGEL PI IND STRL 6.5 (GLOVE) ×1 IMPLANT
GLOVE BIOGEL PI IND STRL 7.0 (GLOVE) IMPLANT
GLOVE BIOGEL PI IND STRL 8 (GLOVE) ×1 IMPLANT
GLOVE BIOGEL PI INDICATOR 6.5 (GLOVE) ×1
GLOVE BIOGEL PI INDICATOR 7.0 (GLOVE) ×1
GLOVE BIOGEL PI INDICATOR 8 (GLOVE) ×1
GLOVE ECLIPSE 8.0 STRL XLNG CF (GLOVE) ×2 IMPLANT
GLOVE SURG SS PI 6.5 STRL IVOR (GLOVE) ×2 IMPLANT
GOWN STRL REUS W/TWL LRG LVL3 (GOWN DISPOSABLE) ×2 IMPLANT
GOWN STRL REUS W/TWL XL LVL3 (GOWN DISPOSABLE) ×3 IMPLANT
KIT BASIN OR (CUSTOM PROCEDURE TRAY) ×2 IMPLANT
KIT POSITION SHOULDER SCHLEI (MISCELLANEOUS) ×2 IMPLANT
LIQUID BAND (GAUZE/BANDAGES/DRESSINGS) ×2 IMPLANT
MANIFOLD NEPTUNE II (INSTRUMENTS) ×2 IMPLANT
PACK SHOULDER CUSTOM OPM052 (CUSTOM PROCEDURE TRAY) ×2 IMPLANT
POSITIONER SURGICAL ARM (MISCELLANEOUS) ×2 IMPLANT
SLING ARM XL FOAM STRAP (SOFTGOODS) ×1 IMPLANT
SPONGE LAP 4X18 X RAY DECT (DISPOSABLE) ×1 IMPLANT
SPONGE SURGIFOAM ABS GEL 100 (HEMOSTASIS) ×2 IMPLANT
SUCTION FRAZIER 12FR DISP (SUCTIONS) ×2 IMPLANT
SUT BONE WAX W31G (SUTURE) ×2 IMPLANT
SUT ETHIBOND NAB CT1 #1 30IN (SUTURE) ×2 IMPLANT
SUT MNCRL AB 4-0 PS2 18 (SUTURE) ×2 IMPLANT
SUT VIC AB 1 CT1 27 (SUTURE) ×4
SUT VIC AB 1 CT1 27XBRD ANTBC (SUTURE) ×2 IMPLANT
SUT VIC AB 2-0 CT1 27 (SUTURE) ×4
SUT VIC AB 2-0 CT1 TAPERPNT 27 (SUTURE) ×2 IMPLANT
TOWEL OR 17X26 10 PK STRL BLUE (TOWEL DISPOSABLE) ×2 IMPLANT

## 2014-07-16 NOTE — Discharge Instructions (Signed)
Keep your sling on at all times, including sleeping in your sling. The dressing should not be removed. It is waterproof The only time you should remove your sling is to shower only but you need to keep your hand against your chest while you shower. .  Call Dr. Gladstone Lighter if any wound complications or temperature of 101 degrees F or over.  Call the office for an appointment to see Dr. Gladstone Lighter in two weeks: [phone] and ask for Dr. Charlestine Night nurse, Brunilda Payor.

## 2014-07-16 NOTE — Anesthesia Postprocedure Evaluation (Signed)
  Anesthesia Post-op Note  Patient: [name]  Procedure(s) Performed: Procedure(s): OPEN RIGHT SHOULDER BURSECTOMY, ACROMIOPLASTY, ACROMIONECTOMY (Right)  Patient Location: PACU  Anesthesia Type:General  Level of Consciousness: awake and alert   Airway and Oxygen Therapy: Patient Spontanous Breathing and Patient connected to nasal cannula oxygen  Post-op Pain: none  Post-op Assessment: Post-op Vital signs reviewed  Post-op Vital Signs: Reviewed and stable  Last Vitals:  Filed Vitals:   07/16/14 1010  BP: 121/83  Pulse: 121  Temp: 36.7 C  Resp: 18    Complications: No apparent anesthesia complications

## 2014-07-16 NOTE — Anesthesia Procedure Notes (Signed)
Anesthesia Regional Block:  Interscalene brachial plexus block  Pre-Anesthetic Checklist: ,, timeout performed, Correct Patient, Correct Site, Correct Laterality, Correct Procedure, Correct Position, site marked, Risks and benefits discussed,  Surgical consent,  Pre-op evaluation,  At surgeon's request and post-op pain management   Prep: chloraprep       Needles:  Injection technique: Single-shot  Needle Type: Echogenic Stimulator Needle     Needle Length: 10cm 10 cm Needle Gauge: 21 and 21 G    Additional Needles:  Procedures: ultrasound guided (picture in chart) and nerve stimulator Interscalene brachial plexus block  Nerve Stimulator or Paresthesia:  Response: 0.8 mA,   Additional Responses:   Narrative:  Start time: 07/16/2014 12:48 PM End time: 07/16/2014 12:59 PM  Performed by: Personally  Anesthesiologist: Alexis Frock  Additional Notes: R IS block with [place] and stimulator guidance, 64ml .5% marcaine with epi injected, talked to patient throughout procedure, no complications

## 2014-07-16 NOTE — Interval H&P Note (Signed)
History and Physical Interval Note:  07/16/2014 1:09 PM  [name]  has presented today for surgery, with the diagnosis of RIGHT SHOULDER ROTATOR CUFF TEAR   The various methods of treatment have been discussed with the patient and family. After consideration of risks, benefits and other options for treatment, the patient has consented to  Procedure(s): OPEN RIGHT SHOULDER ROTATOR CUFF REPAIR WITH POSSIBLE GRAFT AND ANCHORS  (Right) as a surgical intervention .  The patient's history has been reviewed, patient examined, no change in status, stable for surgery.  I have reviewed the patient's chart and labs.  Questions were answered to the patient's satisfaction.     Lucine Bilski A

## 2014-07-16 NOTE — Progress Notes (Signed)
Assisted Dr. Tresa Moore with right, ultrasound guided, interscalene  block. Side rails up, monitors on throughout procedure. See vital signs in flow sheet. Tolerated Procedure well.

## 2014-07-16 NOTE — Brief Op Note (Signed)
07/16/2014  2:13 PM  PATIENT:  [name]  48 y.o. female  PRE-OPERATIVE DIAGNOSIS:  RIGHT SHOULDER ROTATOR CUFF TEAR .Morbid Obesity  POST-OPERATIVE DIAGNOSIS:  RIGHT SHOULDER IMPINGEMENT SYNDROME, BURSITIS.Morbid Obesity  PROCEDURE:  Procedure(s): OPEN RIGHT SHOULDER BURSECTOMY, ACROMIOPLASTY, ACROMIONECTOMY (Right)  SURGEON:  Surgeon(s) and Role:    * Tobi Bastos, MD - Primary  PHYSICIAN ASSISTANT:Amber [place] PA   ASSISTANTS:Amber Hampton PA   ANESTHESIA:   general  EBL:     BLOOD ADMINISTERED:none  DRAINS: none   LOCAL MEDICATIONS USED: Interscalene Nerve Block by Anesthesia,Pre-Op  SPECIMEN:  No Specimen  DISPOSITION OF SPECIMEN:  N/A  COUNTS:  YES  TOURNIQUET:  * No tourniquets in log *  DICTATION: .Other Dictation: Dictation Number [id number]  PLAN OF CARE: Admit for overnight observation  PATIENT DISPOSITION:  Stsble in OR   Delay start of Pharmacological VTE agent (>24hrs) due to surgical blood loss or risk of bleeding: yes

## 2014-07-16 NOTE — Anesthesia Preprocedure Evaluation (Addendum)
Anesthesia Evaluation  Patient identified by MRN, date of birth, ID band Patient awake    Reviewed: Allergy & Precautions, H&P , NPO status , Patient's Chart, lab work & pertinent test results, reviewed documented beta blocker date and time   Airway Mallampati: II       Dental  (+) Teeth Intact   Pulmonary sleep apnea ,  breath sounds clear to auscultation        Cardiovascular hypertension, Rhythm:Regular  ECHO EF 60% 2012, Neg stress 2012   Neuro/Psych Anxiety Depression Bipolar Disorder    GI/Hepatic GERD-  Medicated,  Endo/Other  diabetes, Poorly Controlled, Type 2  Renal/GU      Musculoskeletal   Abdominal (+) + obese,   Peds  Hematology   Anesthesia Other Findings   Reproductive/Obstetrics                            Anesthesia Physical Anesthesia Plan  ASA: III  Anesthesia Plan: General   Post-op Pain Management: MAC Combined w/ Regional for Post-op pain   Induction: Intravenous  Airway Management Planned: Oral ETT  Additional Equipment:   Intra-op Plan:   Post-operative Plan: Extubation in OR  Informed Consent: I have reviewed the patients History and Physical, chart, labs and discussed the procedure including the risks, benefits and alternatives for the proposed anesthesia with the patient or authorized representative who has indicated his/her understanding and acceptance.     Plan Discussed with:   Anesthesia Plan Comments:         Anesthesia Quick Evaluation

## 2014-07-16 NOTE — Transfer of Care (Signed)
Immediate Anesthesia Transfer of Care Note  Patient: [name]  Procedure(s) Performed: Procedure(s) (LRB): OPEN RIGHT SHOULDER BURSECTOMY, ACROMIOPLASTY, ACROMIONECTOMY (Right)  Patient Location: PACU  Anesthesia Type: General  Level of Consciousness: sedated, patient cooperative and responds to stimulation  Airway & Oxygen Therapy: Patient Spontanous Breathing and Patient connected to face mask oxgen  Post-op Assessment: Report given to PACU RN and Post -op Vital signs reviewed and stable  Post vital signs: Reviewed and stable  Complications: No apparent anesthesia complications
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
## 2014-07-17 LAB — GLUCOSE, CAPILLARY: GLUCOSE-CAPILLARY: 101 mg/dL — AB (ref 70–99)

## 2014-07-17 NOTE — Evaluation (Signed)
Occupational Therapy Evaluation Patient Details Name: [name] MRN: [id number] DOB: March 20, 1966 Today's Date: 07/17/2014    History of Present Illness Pt is s/p open acromionectomy and acromioplasty, right shoulder   Clinical Impression   Pt tends to move quickly at times and needs frequent reminders to not move her R shoulder. She is unable to don her own sling without moving her R shoulder. She states she needs to be able to get dressed in the mornings as husband works until mid day. She also only had a loose pullover shirt available today which pt was unable to don without assist to not move R shoulder. Advised pt to obtain a button down shirt and that she will need to have assist to don sling so she does NOT move her R shoulder. She states she can wear her gown and sling until husband gets home mid day and states he can help her get dressed then. Pt planning to have spouse bring in a button down shirt later today and would like for OT to return and show him how to assist with sling and shirt. Will plan one more visit when spouse arrives. Issued shoulder care handout and reviewed all information. Pt performed elbow, wrist and digit ROM at least 5 reps of each to demonstrate understanding.     Follow Up Recommendations  No OT follow up;Other (comment) (progress shoulder as MD advises)    Equipment Recommendations  None recommended by OT    Recommendations for Other Services       Precautions / Restrictions Precautions Precautions: Shoulder Shoulder Interventions: Shoulder sling/immobilizer;Off for dressing/bathing/exercises Restrictions Weight Bearing Restrictions: Yes RUE Weight Bearing: Non weight bearing      Mobility Bed Mobility                  Transfers                      Balance                                            ADL                                               Vision                     Perception     Praxis      Pertinent Vitals/Pain Pain Assessment: 0-10 Pain Score: 3  Pain Descriptors / Indicators: Aching Pain Intervention(s): Repositioned;Ice applied     Hand Dominance     Extremity/Trunk Assessment             Communication     Cognition Arousal/Alertness: Awake/alert Behavior During Therapy: Impulsive Overall Cognitive Status: Within Functional Limits for tasks assessed                     General Comments       Exercises       Shoulder Instructions Shoulder Instructions Donning/doffing shirt without moving shoulder: Moderate assistance Method for sponge bathing under operated UE: Patient able to independently direct caregiver Donning/doffing sling/immobilizer: Moderate assistance Correct positioning of sling/immobilizer: Independent ROM for elbow, wrist and digits of operated UE: Supervision/safety Sling wearing  schedule (on at all times/off for ADL's): Independent Proper positioning of operated UE when showering: Independent Positioning of UE while sleeping: [place] expects to be discharged to:: Private residence Living Arrangements: Spouse/significant other                                      Prior Functioning/Environment               OT Diagnosis: Generalized weakness   OT Problem List: Decreased strength;Decreased knowledge of precautions   OT Treatment/Interventions: Self-care/ADL training;Patient/family education;Therapeutic activities    OT Goals(Current goals can be found in the care plan section) Acute Rehab OT Goals Patient Stated Goal: home OT Goal Formulation: With patient Time For Goal Achievement: 07/24/14 Potential to Achieve Goals: Good  OT Frequency: Min 2X/week   Barriers to D/C:            Co-evaluation              End of Session    Activity Tolerance: Patient tolerated treatment well Patient left: in chair   Time:  7544-9201 OT Time Calculation (min): 41 min Charges:  OT General Charges $OT Visit: 1 Procedure OT Evaluation $Initial OT Evaluation Tier I: 1 Procedure OT Treatments $Self Care/Home Management : 23-37 mins G-Codes:    Jules Schick  [phone] 07/17/2014, 12:44 PM

## 2014-07-17 NOTE — Op Note (Signed)
NAMECHARLEEN, [name]              ACCOUNT NO.:  [id number]  MEDICAL RECORD NO.:  [id number]  LOCATION:  46                         FACILITY:  [facility]  PHYSICIAN:  Kipp Brood. Destiney Sanabia, M.D.DATE OF BIRTH:  [birth year]  DATE OF PROCEDURE:  07/16/2014 DATE OF DISCHARGE:                              OPERATIVE REPORT   PREOPERATIVE DIAGNOSES: 1. Partial tear of rotator cuff tendon, right shoulder. 2. Severe impingement syndrome, right shoulder. 3. Chronic subdeltoid bursitis, right shoulder.  POSTOPERATIVE DIAGNOSES: 1. Partial tear of rotator cuff tendon, right shoulder. 2. Severe impingement syndrome, right shoulder. 3. Chronic subdeltoid bursitis, right shoulder.  OPERATION: 1. Open acromionectomy and acromioplasty, right shoulder. 2. Excision of subdeltoid bursa, right shoulder. 3. Exploration of the rotator cuff, right shoulder.  SURGEON:  Kipp Brood. Gladstone Lighter, M.D.  OPERATIVE ASSISTANT:  Ardeen Jourdain, PA  DESCRIPTION OF PROCEDURE:  Under general anesthesia, routine orthopedic prep and draping of the right shoulder was carried out.  Bleeders were identified and cauterized.  She had 2 g of IV Ancef.  Note, preop she had an interscalene nerve block by anesthesiologist.  The appropriate time-out was first carried out.  I also marked the appropriate right shoulder in the holding area.  At this time, with the patient on the beach chair position,  incision was made over the anterior aspect of the right shoulder.  Bleeders were identified and cauterized.  The incision was taken down through a large amount of subcutaneous tissue.  Note, she was morbidly obese.  At this time, I then split a small proximal part of the deltoid muscle in usual fashion.  Following that, I went down identified the acromion.  I stripped the deltoid tendon off the acromion in usual fashion.  We then noted an extremely wide thickened downsloping acromion totally was embedding into the rotator cuff.  I  gently retracted the shoulder per se, inserted a Bennett retractor up under the acromion and did an acromionectomy with the oscillating saw, then an acromioplasty.  Once this was done, we had a nice re-establishment of the subacromial space. The bursa was quite thickened and inflamed.  I did a subdeltoid bursectomy.  I then explored the rotator cuff.  There were no major tears. The MRI of the we had done was simply showing some small interstitial tears, but there was no repair that was necessary. We thoroughly irrigated out the area.  I then bone waxed the undersurface of the acromion.  I inserted some thrombin-soaked Gelfoam and good control the bleeding was carried out.  Note, I further explored the area and I still did not see any special indication to repair any rotator cuff.  I then repaired the deltoid tendon and muscle in the usual fashion.  Subcu was closed in usual fashion and skin was closed with a subcuticular Monocryl suture. Sterile dressings were applied. No Exparel was needed since we did the interscalene block.          ______________________________ Kipp Brood. Gladstone Lighter, M.D.     RAG/MEDQ  D:  07/16/2014  T:  07/17/2014  Job:  [id number]

## 2014-07-17 NOTE — Progress Notes (Signed)
[name]  MRN: [id number] DOB/Age: Oct 29, 1965 49 y.o. Physician: Rada Hay Procedure: Procedure(s) (LRB): OPEN RIGHT SHOULDER BURSECTOMY, ACROMIOPLASTY, ACROMIONECTOMY (Right)     Subjective: Up in chair working with therapy  Vital Signs Temp:  [97.7 F (36.5 C)-98.6 F (37 C)] 97.7 F (36.5 C) (01/01 0450) Pulse Rate:  [88-121] 96 (01/01 0450) Resp:  [15-24] 16 (01/01 0450) BP: (100-137)/(55-94) 137/87 mmHg (01/01 0450) SpO2:  [91 %-100 %] 91 % (01/01 0450) Weight:  [129.275 kg (285 lb)] 129.275 kg (285 lb) (12/31 1707)  Lab Results No results for input(s): WBC, HGB, HCT, PLT in the last 72 hours. BMET No results for input(s): NA, K, CL, CO2, GLUCOSE, BUN, CREATININE, CALCIUM in the last 72 hours. INR  Date Value Ref Range Status  07/06/2014 0.97 0.00 - 1.49 Final     Exam Dressing dry Still some proximal numbness remaining from block Moving hand well        Plan Discharge home rx in chart  [facility] for Dr.Kevin Supple 07/17/2014, 9:22 AM

## 2014-07-17 NOTE — Progress Notes (Signed)
Occupational Therapy Treatment Patient Details Name: [name] MRN: [id number] DOB: 1965/10/04 Today's Date: 07/17/2014    History of present illness Pt is s/p open acromionectomy and acromioplasty, right shoulder   OT comments  Pt seen for second OT session to reinforce education with spouse present. Spouse able to demonstrate safely assisting with donning shirt and sling without moving pt's R shoulder. He brought a button down shirt purchased today. Emphasized importance of not moving R shoulder. They verbalize understanding.    Follow Up Recommendations  No OT follow up    Equipment Recommendations  None recommended by OT    Recommendations for Other Services      Precautions / Restrictions Precautions Precautions: Shoulder Shoulder Interventions: Shoulder sling/immobilizer;Off for dressing/bathing/exercises Restrictions Weight Bearing Restrictions: Yes RUE Weight Bearing: Non weight bearing       Mobility Bed Mobility                  Transfers                      Balance                                   ADL                                         General ADL Comments: Pt and spouse able to don shirt and sling without moving R shoulder with independence. Reemphasized the importance of NOT moving the R shoulder. Pt and spouse verbalize understanding. Spouse demonstrated ability to safely assist with sling and shirt and feels comfortable with technique.       Vision                     Perception     Praxis      Cognition   Behavior During Therapy: Impulsive Overall Cognitive Status: Within Functional Limits for tasks assessed                       Extremity/Trunk Assessment               Exercises    Shoulder Instructions      General Comments      Pertinent Vitals/ Pain       Pain Assessment: 0-10 Pain Score: 7  Pain Location: R shoulder Pain Descriptors /  Indicators: Aching Pain Intervention(s): Patient requesting pain meds-RN notified;Repositioned (nursing in room to give meds)  Home Living Family/patient expects to be discharged to:: Private residence Living Arrangements: Spouse/significant other                                      Prior Functioning/Environment              Frequency Min 2X/week     Progress Toward Goals  OT Goals(current goals can now be found in the care plan section)  Progress towards OT goals: Goals met/education completed, patient discharged from OT  Acute Rehab OT Goals Patient Stated Goal: home OT Goal Formulation: With patient Time For Goal Achievement: 07/24/14 Potential to Achieve Goals: Good ADL Goals Additional ADL Goal #1: Spouse will safely assist with donning shirt and sling  without moving pt's R shoulder.  Plan All goals met and education completed, patient discharged from OT services    Co-evaluation                 End of Session     Activity Tolerance Patient tolerated treatment well   Patient Left in bed;with family/visitor present   Nurse Communication          Time: 5248-1859 OT Time Calculation (min): 10 min  Charges: OT General Charges $OT Visit: 1 Procedure OT Evaluation $Initial OT Evaluation Tier I: 1 Procedure OT Treatments $Self Care/Home Management : 8-22 mins  Jules Schick  [phone] 07/17/2014, 12:53 PM

## 2014-07-20 ENCOUNTER — Encounter (HOSPITAL_COMMUNITY): Payer: Self-pay | Admitting: Orthopedic Surgery
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
## 2015-01-28 ENCOUNTER — Other Ambulatory Visit: Payer: Self-pay | Admitting: Rheumatology

## 2015-01-28 DIAGNOSIS — M25472 Effusion, left ankle: Secondary | ICD-10-CM

## 2015-02-09 ENCOUNTER — Ambulatory Visit
Admission: RE | Admit: 2015-02-09 | Discharge: 2015-02-09 | Disposition: A | Discharge status: Home / Self Care | Payer: Self-pay | Source: Ambulatory Visit | Attending: Rheumatology | Admitting: Rheumatology

## 2015-02-09 DIAGNOSIS — M25472 Effusion, left ankle: Secondary | ICD-10-CM

## 2015-02-09 MED ORDER — GADOBENATE DIMEGLUMINE 529 MG/ML IV SOLN
20.0000 mL | Freq: Once | INTRAVENOUS | Status: AC | PRN
  Administered 2015-02-09: 20 mL via INTRAVENOUS

## 2015-05-11 ENCOUNTER — Encounter (HOSPITAL_COMMUNITY): Payer: Self-pay | Admitting: Psychiatry

## 2015-05-11 ENCOUNTER — Ambulatory Visit (INDEPENDENT_AMBULATORY_CARE_PROVIDER_SITE_OTHER): Payer: PPO | Admitting: Psychiatry

## 2015-05-11 VITALS — BP 127/88 | HR 84 | Ht 63.0 in | Wt 281.2 lb

## 2015-05-11 DIAGNOSIS — F411 Generalized anxiety disorder: Secondary | ICD-10-CM

## 2015-05-11 DIAGNOSIS — F332 Major depressive disorder, recurrent severe without psychotic features: Secondary | ICD-10-CM | POA: Diagnosis not present

## 2015-05-11 MED ORDER — BUPROPION HCL ER (XL) 150 MG PO TB24: 150.0000 mg | ORAL_TABLET | Freq: Every day | ORAL | Status: DC
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
## 2015-05-11 NOTE — Patient Instructions (Signed)
Labs:  Antipsychotic- CBC, CMP, HbA1c, Lipid panel, TSH, Prolactin level, EKG

## 2015-06-17 ENCOUNTER — Ambulatory Visit (HOSPITAL_COMMUNITY): Payer: Self-pay | Admitting: Clinical

## 2015-07-15 ENCOUNTER — Encounter (HOSPITAL_COMMUNITY): Payer: Self-pay | Admitting: Clinical

## 2015-07-15 ENCOUNTER — Ambulatory Visit (INDEPENDENT_AMBULATORY_CARE_PROVIDER_SITE_OTHER): Payer: PPO | Admitting: Clinical

## 2015-07-15 DIAGNOSIS — F411 Generalized anxiety disorder: Secondary | ICD-10-CM | POA: Diagnosis not present

## 2015-07-15 DIAGNOSIS — F4321 Adjustment disorder with depressed mood: Secondary | ICD-10-CM

## 2015-07-15 DIAGNOSIS — F332 Major depressive disorder, recurrent severe without psychotic features: Secondary | ICD-10-CM
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
## 2015-07-22 ENCOUNTER — Encounter (HOSPITAL_COMMUNITY): Payer: Self-pay | Admitting: Psychiatry

## 2015-07-22 ENCOUNTER — Ambulatory Visit (INDEPENDENT_AMBULATORY_CARE_PROVIDER_SITE_OTHER): Payer: PPO | Admitting: Psychiatry

## 2015-07-22 VITALS — BP 120/74 | HR 83 | Ht 63.0 in | Wt 288.4 lb

## 2015-07-22 DIAGNOSIS — F332 Major depressive disorder, recurrent severe without psychotic features: Secondary | ICD-10-CM | POA: Diagnosis not present

## 2015-07-22 DIAGNOSIS — G4700 Insomnia, unspecified: Secondary | ICD-10-CM

## 2015-07-22 DIAGNOSIS — F411 Generalized anxiety disorder: Secondary | ICD-10-CM

## 2015-07-22 MED ORDER — PAROXETINE HCL 20 MG PO TABS: 20.0000 mg | ORAL_TABLET | Freq: Every day | ORAL | Status: DC

## 2015-07-22 MED ORDER — ZOLPIDEM TARTRATE 5 MG PO TABS: 5.0000 mg | ORAL_TABLET | Freq: Every evening | ORAL | Status: DC | PRN

## 2015-07-22 MED ORDER — RISPERIDONE 2 MG PO TABS: 2.0000 mg | ORAL_TABLET | Freq: Every day | ORAL | Status: DC
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
## 2015-08-05 ENCOUNTER — Ambulatory Visit (INDEPENDENT_AMBULATORY_CARE_PROVIDER_SITE_OTHER): Payer: PPO | Admitting: Clinical

## 2015-08-05 DIAGNOSIS — F4321 Adjustment disorder with depressed mood: Secondary | ICD-10-CM | POA: Diagnosis not present

## 2015-08-05 DIAGNOSIS — F411 Generalized anxiety disorder: Secondary | ICD-10-CM | POA: Diagnosis not present

## 2015-08-05 DIAGNOSIS — F332 Major depressive disorder, recurrent severe without psychotic features: Secondary | ICD-10-CM

## 2015-08-05 NOTE — Progress Notes (Signed)
   THERAPIST PROGRESS NOTE  Session Time: 2:33- 3:29  Participation Level: Active  Behavioral Response: CasualAlertDepressed  Type of Therapy: Individual Therapy  Treatment Goals addressed: Improve Psychiatric Symptoms, Elevate Mood (being more active, getting out more), Improve Unhelpful Thought Patterns, Discuss and process grief, decrease anxiety (reduce restlessness, reduce trembling, reduce angry outburst)  Interventions: Motivational Interviewing, CBT, Grounding & Mindfulness Techniques  Summary: [name] is [name] 50 y.o. female who presents with Major Depressive Disorder, Recurrent Severe, without psychotic features, and Generalized Anxiety Disorder, and Unresolved Grief  Suicidal/Homicidal: No -without intent/plan  Therapist Response: [name] met with clinician for an individual session. [name] discussed her psychiatric symptoms, her current life events, and her goals for therapy. [name] shared that she was struggling with caring for her mother in law who has dementia. She shared that she gets angry and depressed. [name] and clinician discussed grounding and mindfulness techniques. Clinician explained the process, purpose, and practice of the techniques. [name] and clinician practiced some of the techniques together. [name] agreed to practice the techniques 2x daily until next session. [name] shared an incident that made her feel [name] lot of anger. [name] does not harm her mother in law, but her own anger adds stress to the situation and decreases her coping ability. Clinician introduced some basic cbt concepts. [name] and clinician discussed alternative ways to view and address her mother in law.Clinician asked who it would benefit if she approached things differently. [name] shared that her, her husband, and her mother in law would benefit. She shared the reasons why - including improving her own mental health. [name] explored some alternative ways to approach   the situation. She identified some  approaches she would be willing to try. She shared it felt good to know that she had some power to improve the situation. [name] talked briefly about her friend who had passed. [name] and clinician agreed to discuss it further at future sessions. Clinician gave [name] [name] homework packet which she agreed to complete and bring back with her to next session.  Plan: Return again in 1 weeks  Diagnosis:     Axis I: Major Depressive Disorder, Recurrent Severe, without psychotic features, and Generalized Anxiety Disorder, and Unresolved Grief    Jessica Quinones A, LCSW 08/05/2015

## 2015-08-09 ENCOUNTER — Encounter (HOSPITAL_COMMUNITY): Payer: Self-pay | Admitting: Clinical

## 2015-08-19 ENCOUNTER — Ambulatory Visit (INDEPENDENT_AMBULATORY_CARE_PROVIDER_SITE_OTHER): Payer: PPO | Admitting: Clinical

## 2015-08-19 ENCOUNTER — Encounter (HOSPITAL_COMMUNITY): Payer: Self-pay | Admitting: Clinical

## 2015-08-19 DIAGNOSIS — F411 Generalized anxiety disorder: Secondary | ICD-10-CM | POA: Diagnosis not present

## 2015-08-19 DIAGNOSIS — F4321 Adjustment disorder with depressed mood: Secondary | ICD-10-CM | POA: Diagnosis not present

## 2015-08-19 DIAGNOSIS — F332 Major depressive disorder, recurrent severe without psychotic features: Secondary | ICD-10-CM | POA: Diagnosis not present

## 2015-08-19 NOTE — Progress Notes (Signed)
   THERAPIST PROGRESS NOTE  Session Time: 11:55 - 12:55  Participation Level: Active  Behavioral Response: CasualAlertDepressed   Type of Therapy: Individual Therapy  Treatment Goals addressed: Improve Psychiatric Symptoms, Elevate Mood (being more active), Improve Unhelpful Thought Patterns,  decrease anxiety (reduce restlessness, reduce angry outburst)  Interventions: Motivational Interviewing, CBT, Grounding & Mindfulness Techniques  Summary: [name] is a 50 y.o. female who presents with Major Depressive Disorder, Recurrent Severe, without psychotic features, and Generalized Anxiety Disorder, and Unresolved Grief  Suicidal/Homicidal: No -without intent/plan  Therapist Response: [name] met with clinician for an individual session. [name] discussed her psychiatric symptoms and her current life events. [name] shared that she has been feeling slightly better. She shared that she believes her medication has been working better.  She stated that she has been practicing her  Grounding and mindfulness techniques. She shared that she was finding them helpful in helping her to remain calmer. She shared that she had less angry outburst since last session. She shared about her successes in using the techniques discussed in last session. [name] shared that this had a positive affect for  All in the house. [name] also shared about some of the times she was not as successful as she would like to be. Client and clinician used a 7 panel thought record sheet to explore some of her negative automatic thoughts. She was then able to formulate healthier alternative thoughts. [name] shared some about how her Polycystic Ovary Syndrome (PCOS) makes things more challenging for her and affects her depression. Client and clinician discussed how she could use the 7 panel thought record sheet to challenge some of her negative thoughts that "keep her stuck in inactivity". Client and clinician briefly reviewed her homework  packet and she agreed to complete another one as well as continue to her grounding and mindfulness techniques.   Plan: Return again in 1 weeks  Diagnosis:     Axis I: Major Depressive Disorder, Recurrent Severe, without psychotic features, and Generalized Anxiety Disorder, and Unresolved Grief   [name] A, LCSW 08/19/2015

## 2015-08-31 DIAGNOSIS — M5416 Radiculopathy, lumbar region: Secondary | ICD-10-CM | POA: Diagnosis not present

## 2015-08-31 DIAGNOSIS — M4806 Spinal stenosis, lumbar region: Secondary | ICD-10-CM | POA: Diagnosis not present

## 2015-09-03 DIAGNOSIS — M722 Plantar fascial fibromatosis: Secondary | ICD-10-CM | POA: Diagnosis not present

## 2015-09-06 ENCOUNTER — Encounter (HOSPITAL_COMMUNITY): Payer: Self-pay | Admitting: Clinical

## 2015-09-06 ENCOUNTER — Ambulatory Visit (INDEPENDENT_AMBULATORY_CARE_PROVIDER_SITE_OTHER): Payer: PPO | Admitting: Clinical

## 2015-09-06 DIAGNOSIS — F332 Major depressive disorder, recurrent severe without psychotic features: Secondary | ICD-10-CM

## 2015-09-06 DIAGNOSIS — F4321 Adjustment disorder with depressed mood: Secondary | ICD-10-CM

## 2015-09-06 DIAGNOSIS — F411 Generalized anxiety disorder: Secondary | ICD-10-CM

## 2015-09-06 NOTE — Progress Notes (Signed)
   THERAPIST PROGRESS NOTE  Session Time: 2:30 - 3:28  Participation Level: Active  Behavioral Response: CasualAlertNA  Type of Therapy: Individual Therapy  Treatment Goals addressed: Improve Psychiatric Symptoms, Elevate Mood (being more active, getting out more), Improve Unhelpful Thought Patterns, Discuss and process grief, decrease anxiety (reduce restlessness, reduce trembling, reduce angry outburst)  Interventions: Motivational Interviewing, CBT, Grounding & Mindfulness Techniques  Summary: [name] is a 50 y.o. female who presents with Major Depressive Disorder, Recurrent Severe, without psychotic features, and Generalized Anxiety Disorder, and Unresolved Grief  Suicidal/Homicidal: No -without intent/plan  Therapist Response: [name] met with clinician for an individual session. [name] discussed her psychiatric symptoms and her current life events. [name] shared that she is making improvements. She shared that she felt her medication is working well and that her skills practice is paying off. [name] shared that her angry outburst are decreasing as she has been using her grounding and mindfulness techniques. She shared that she uses them when ever she finds herself becoming frustrated with her mother in law (who she cares for). Client and clinician discussed her successes and her challenges with using the techniques. Client and clinician discussed ways to improve her techniques. The homework packet she completed had a guided imagery in it. Client and clinician discussed how she could use this also by recording the script herself. Clinician also informed [name] about a podcast of guided imagery. Client and clinician discussed how she could incorporate this into the care for her mother in law ( which would decrease her anxiety). Client and clinician discussed the fact that [name] does not have very many friends or outside activities due to her obligation to care for her mother in law. Client  and clinician discussed arranging sometime however brief to have activities (gym, crafting class etc.). [name] shared her insight that she needs to begin these soon because other wise when her mother in law passes she will just be alone and that would be difficult. Client and clinician also discussed the immediate benefits of being more active and social.   [name] and clinician talked briefly about her grief. In sharing about her friend [name] and his wife. She shared a little about him. She stopped herself and said she was not going to cry today. Client and clinician did a grounding technique together. Client and clinician discussed the fact that [name] could set the pace with discussing her grief. She said that felt good. [name] agreed to continue her homework until next session.   Plan: Return again in 1-2 weeks  Diagnosis:     Axis I: Major Depressive Disorder, Recurrent Severe, without psychotic features, and Generalized Anxiety Disorder, and Unresolved Grief      [name] A, LCSW 09/06/2015

## 2015-09-20 ENCOUNTER — Ambulatory Visit (HOSPITAL_COMMUNITY): Payer: Self-pay | Admitting: Clinical

## 2015-09-23 ENCOUNTER — Encounter (HOSPITAL_COMMUNITY): Payer: Self-pay | Admitting: Psychiatry

## 2015-09-23 ENCOUNTER — Ambulatory Visit (INDEPENDENT_AMBULATORY_CARE_PROVIDER_SITE_OTHER): Payer: PPO | Admitting: Psychiatry

## 2015-09-23 VITALS — BP 120/74 | HR 81 | Ht 63.5 in | Wt 296.2 lb

## 2015-09-23 DIAGNOSIS — F411 Generalized anxiety disorder: Secondary | ICD-10-CM

## 2015-09-23 DIAGNOSIS — F332 Major depressive disorder, recurrent severe without psychotic features: Secondary | ICD-10-CM | POA: Diagnosis not present

## 2015-09-23 DIAGNOSIS — G4700 Insomnia, unspecified: Secondary | ICD-10-CM | POA: Diagnosis not present

## 2015-09-23 MED ORDER — PAROXETINE HCL 20 MG PO TABS
20.0000 mg | ORAL_TABLET | Freq: Every day | ORAL | Status: DC
Start: 2015-09-23 — End: 2015-12-02

## 2015-09-23 MED ORDER — ZOLPIDEM TARTRATE 5 MG PO TABS
5.0000 mg | ORAL_TABLET | Freq: Every evening | ORAL | Status: DC | PRN
Start: 2015-09-23 — End: 2015-12-02
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
## 2015-10-14 ENCOUNTER — Ambulatory Visit (HOSPITAL_COMMUNITY): Payer: Self-pay | Admitting: Clinical

## 2015-11-11 ENCOUNTER — Ambulatory Visit (HOSPITAL_COMMUNITY): Payer: Self-pay | Admitting: Clinical

## 2015-11-24 DIAGNOSIS — M47816 Spondylosis without myelopathy or radiculopathy, lumbar region: Secondary | ICD-10-CM | POA: Diagnosis not present

## 2015-11-30 ENCOUNTER — Ambulatory Visit (INDEPENDENT_AMBULATORY_CARE_PROVIDER_SITE_OTHER): Payer: PPO | Admitting: Clinical

## 2015-11-30 ENCOUNTER — Encounter (HOSPITAL_COMMUNITY): Payer: Self-pay | Admitting: Clinical

## 2015-11-30 DIAGNOSIS — F331 Major depressive disorder, recurrent, moderate: Secondary | ICD-10-CM | POA: Diagnosis not present

## 2015-11-30 DIAGNOSIS — F4321 Adjustment disorder with depressed mood: Secondary | ICD-10-CM | POA: Diagnosis not present

## 2015-11-30 DIAGNOSIS — F411 Generalized anxiety disorder: Secondary | ICD-10-CM

## 2015-12-02 ENCOUNTER — Encounter (HOSPITAL_COMMUNITY): Payer: Self-pay | Admitting: Psychiatry

## 2015-12-02 ENCOUNTER — Ambulatory Visit (INDEPENDENT_AMBULATORY_CARE_PROVIDER_SITE_OTHER): Payer: PPO | Admitting: Psychiatry

## 2015-12-02 VITALS — BP 100/70 | HR 78 | Ht 64.0 in | Wt 292.6 lb

## 2015-12-02 DIAGNOSIS — F332 Major depressive disorder, recurrent severe without psychotic features: Secondary | ICD-10-CM | POA: Diagnosis not present

## 2015-12-02 DIAGNOSIS — G4700 Insomnia, unspecified: Secondary | ICD-10-CM

## 2015-12-02 DIAGNOSIS — F411 Generalized anxiety disorder: Secondary | ICD-10-CM

## 2015-12-02 MED ORDER — TRAZODONE HCL 50 MG PO TABS: ORAL_TABLET | ORAL | Status: DC

## 2015-12-02 MED ORDER — PAROXETINE HCL 40 MG PO TABS: 40.0000 mg | ORAL_TABLET | Freq: Every day | ORAL | Status: DC
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
## 2015-12-04 DIAGNOSIS — M4802 Spinal stenosis, cervical region: Secondary | ICD-10-CM | POA: Diagnosis not present

## 2015-12-04 DIAGNOSIS — M542 Cervicalgia: Secondary | ICD-10-CM | POA: Diagnosis not present
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
## 2015-12-06 NOTE — Progress Notes (Deleted)
   THERAPIST PROGRESS NOTE  Session Time: 3:30 - 4:25  Participation Level: {BHH PARTICIPATION LEVEL:22264}  Behavioral Response: {Appearance:22683}{BHH LEVEL OF CONSCIOUSNESS:22305}{BHH MOOD:22306}  Type of Therapy: {CHL AMB BH Type of Therapy:21022741}  Treatment Goals addressed: {CHL AMB BH Treatment Goals Addressed:21022754}  Interventions: {CHL AMB BH Type of Intervention:21022753}  Summary: [name] is a 50 y.o. female who presents with ***.   Suicidal/Homicidal: {BHH YES OR NO:22294}{yes/no/with/without intent/plan:22693}  Therapist Response: ***  Plan: Return again in *** weeks.  Diagnosis: Axis I: {psych axis 1:31909}      Shelda Truby A, LCSW 11/30/2015

## 2015-12-07 DIAGNOSIS — M1990 Unspecified osteoarthritis, unspecified site: Secondary | ICD-10-CM | POA: Diagnosis not present

## 2015-12-07 DIAGNOSIS — I10 Essential (primary) hypertension: Secondary | ICD-10-CM | POA: Diagnosis not present

## 2015-12-07 DIAGNOSIS — N3281 Overactive bladder: Secondary | ICD-10-CM | POA: Diagnosis not present

## 2015-12-07 DIAGNOSIS — M62830 Muscle spasm of back: Secondary | ICD-10-CM | POA: Diagnosis not present

## 2015-12-07 DIAGNOSIS — F3340 Major depressive disorder, recurrent, in remission, unspecified: Secondary | ICD-10-CM | POA: Diagnosis not present

## 2015-12-07 DIAGNOSIS — E1165 Type 2 diabetes mellitus with hyperglycemia: Secondary | ICD-10-CM | POA: Diagnosis not present

## 2015-12-07 DIAGNOSIS — E039 Hypothyroidism, unspecified: Secondary | ICD-10-CM | POA: Diagnosis not present

## 2015-12-07 DIAGNOSIS — E785 Hyperlipidemia, unspecified: Secondary | ICD-10-CM | POA: Diagnosis not present

## 2015-12-07 DIAGNOSIS — G4700 Insomnia, unspecified: Secondary | ICD-10-CM | POA: Diagnosis not present

## 2015-12-27 ENCOUNTER — Ambulatory Visit (INDEPENDENT_AMBULATORY_CARE_PROVIDER_SITE_OTHER): Payer: PPO | Admitting: Clinical

## 2015-12-27 ENCOUNTER — Encounter (HOSPITAL_COMMUNITY): Payer: Self-pay | Admitting: Clinical

## 2015-12-27 DIAGNOSIS — F4321 Adjustment disorder with depressed mood: Secondary | ICD-10-CM | POA: Diagnosis not present

## 2015-12-27 DIAGNOSIS — F411 Generalized anxiety disorder: Secondary | ICD-10-CM | POA: Diagnosis not present

## 2015-12-27 DIAGNOSIS — F332 Major depressive disorder, recurrent severe without psychotic features: Secondary | ICD-10-CM | POA: Diagnosis not present

## 2015-12-27 DIAGNOSIS — M542 Cervicalgia: Secondary | ICD-10-CM | POA: Diagnosis not present

## 2015-12-27 NOTE — Progress Notes (Signed)
   THERAPIST PROGRESS NOTE  Session Time: 1:30 - 2:30  Participation Level: Active  Behavioral Response: CasualAlertAngry and Depressed  Type of Therapy: Individual Therapy  Treatment Goals addressed: Improve Psychiatric Symptoms, Elevate Mood, Improve Unhelpful Thought Patterns,  decrease anxiety ( reduce angry outburst)  Interventions: Motivational Interviewing, CBT, Grounding & Mindfulness Techniques  Summary: [name] is a 50 y.o. female who presents with Major Depressive Disorder, Recurrent Severe, without psychotic features, and Generalized Anxiety Disorder, and Unresolved Grief  Suicidal/Homicidal: No -without intent/plan  Therapist Response: [name] met with clinician for an individual session. [name] discussed her psychiatric symptoms, her current life events, and her homework. [name] used a cane to assist with her walking today. [name] shared that she has been in a lot of pain. She shared that she has not been able to get any relief. She shared that this limits her mobility and depends her depression. She shared that continues to struggle with taking care of her mother in law. She s stated that one of the things that makes taking care of her mother-in-law so difficult is that another family members doesn't help and so it is a [name], [name]. [name] shared her negative thoughts about the other family member. She shared that when she thinks about she becomes very anger and it increases her feeling of burden (depression). Clinician affirmed her feeling. Client and clinician discussed what would be different if she didn't expect the family member to do the things he does not do. [name] shared how she would schedule and how she would view her task. She shared that it would help  improve her mood. Clinician asked open ended questions and [name] formulated other ways to view the situation that didn't make her feel so bad. Client and clinician discussed the process and benefit of changing our thoughts  when we are unable to change our situation. Client and clinician discussed her grounding and mindfulness techniques and clinician encouraged her to practice them. Clinician directed her to a source for audio to use  mindfulness for  pain and depression. [name] stated that she was glad she came to therapy. Clinician gave [name] Distress Tolerance packet 1, which [name] agreed to complete and bring back with her to next session. Clinician explained how she could access the packets on her own should she want to  Plan: Return again in 2-3 weeks  Diagnosis:     Axis I: Major Depressive Disorder, Recurrent Severe, without psychotic features, and Generalized Anxiety Disorder, and Unresolved Grief    [name] A, LCSW 12/27/2015

## 2015-12-31 ENCOUNTER — Other Ambulatory Visit: Payer: Self-pay | Admitting: Physical Medicine and Rehabilitation

## 2015-12-31 DIAGNOSIS — M542 Cervicalgia: Secondary | ICD-10-CM | POA: Diagnosis not present

## 2015-12-31 DIAGNOSIS — R29898 Other symptoms and signs involving the musculoskeletal system: Secondary | ICD-10-CM

## 2015-12-31 DIAGNOSIS — M545 Low back pain: Secondary | ICD-10-CM

## 2016-01-01 ENCOUNTER — Ambulatory Visit
Admission: RE | Admit: 2016-01-01 | Discharge: 2016-01-01 | Disposition: A | Discharge status: Home / Self Care | Payer: PPO | Source: Ambulatory Visit | Attending: Physical Medicine and Rehabilitation | Admitting: Physical Medicine and Rehabilitation

## 2016-01-01 DIAGNOSIS — M545 Low back pain: Secondary | ICD-10-CM

## 2016-01-01 DIAGNOSIS — R29898 Other symptoms and signs involving the musculoskeletal system: Secondary | ICD-10-CM

## 2016-01-01 DIAGNOSIS — M5126 Other intervertebral disc displacement, lumbar region: Secondary | ICD-10-CM | POA: Diagnosis not present

## 2016-01-03 DIAGNOSIS — M542 Cervicalgia: Secondary | ICD-10-CM | POA: Diagnosis not present

## 2016-01-06 DIAGNOSIS — M5416 Radiculopathy, lumbar region: Secondary | ICD-10-CM | POA: Diagnosis not present

## 2016-01-06 DIAGNOSIS — M5136 Other intervertebral disc degeneration, lumbar region: Secondary | ICD-10-CM | POA: Diagnosis not present

## 2016-01-06 DIAGNOSIS — M5126 Other intervertebral disc displacement, lumbar region: Secondary | ICD-10-CM | POA: Diagnosis not present

## 2016-01-10 ENCOUNTER — Ambulatory Visit (HOSPITAL_COMMUNITY): Payer: Self-pay | Admitting: Clinical

## 2016-01-11 ENCOUNTER — Ambulatory Visit: Payer: Self-pay | Admitting: Orthopedic Surgery

## 2016-01-14 NOTE — Patient Instructions (Addendum)
[name]  01/14/2016   Your procedure is scheduled on: Wednesday January 26, 2016  Report to [facility] Main  Entrance take [place]  elevators to 3rd floor to  [place] at 6:30 AM.  Call this number if you have problems the morning of surgery [phone]   Remember: ONLY 1 PERSON MAY GO WITH YOU TO SHORT STAY TO GET  READY MORNING OF [place].  Do not eat food or drink liquids :After Midnight.     Take these medicines the morning of surgery with A SIP OF WATER: Levothyroxine (Synthroid), , albuterol inhaler if needed (please bring with you morning of surgery); May take oxycodone-acetaminophen if needed  DO NOT TAKE ANY DIABETIC MEDICATIONS DAY OF YOUR SURGERY                               You may not have any metal on your body including hair pins and              piercings  Do not wear jewelry, make-up, lotions, powders or perfumes, deodorant             Do not wear nail polish.  Do not shave  48 hours prior to surgery.             Do not bring valuables to the hospital. [place].  Contacts, dentures or bridgework may not be worn into surgery.  Leave suitcase in the car. After surgery it may be brought to your room.      Special Instructions: BRING CPAP MASK AND TUBING DAY OF SURGERY               Please read over the following fact sheets you were given:INCENTIVE SPIROMETER;  _____________________________________________________________________             [facility] - Preparing for Surgery Before surgery, you can play an important role.  Because skin is not sterile, your skin needs to be as free of germs as possible.  You can reduce the number of germs on your skin by washing with CHG (chlorahexidine gluconate) soap before surgery.  CHG is an antiseptic cleaner which kills germs and bonds with the skin to continue killing germs even after washing. Please DO NOT use if you have an allergy  to CHG or antibacterial soaps.  If your skin becomes reddened/irritated stop using the CHG and inform your nurse when you arrive at Short Stay. Do not shave (including legs and underarms) for at least 48 hours prior to the first CHG shower.  You may shave your face/neck. Please follow these instructions carefully:  1.  Shower with CHG Soap the night before surgery and the  morning of Surgery.  2.  If you choose to wash your hair, wash your hair first as usual with your  normal  shampoo.  3.  After you shampoo, rinse your hair and body thoroughly to remove the  shampoo.                           4.  Use CHG as you would any other liquid soap.  You can apply chg directly  to the skin and wash  Gently with a scrungie or clean washcloth.  5.  Apply the CHG Soap to your body ONLY FROM THE NECK DOWN.   Do not use on face/ open                           Wound or open sores. Avoid contact with eyes, ears mouth and genitals (private parts).                       Wash face,  Genitals (private parts) with your normal soap.             6.  Wash thoroughly, paying special attention to the area where your surgery  will be performed.  7.  Thoroughly rinse your body with warm water from the neck down.  8.  DO NOT shower/wash with your normal soap after using and rinsing off  the CHG Soap.                9.  Pat yourself dry with a clean towel.            10.  Wear clean pajamas.            11.  Place clean sheets on your bed the night of your first shower and do not  sleep with pets. Day of Surgery : Do not apply any lotions/deodorants the morning of surgery.  Please wear clean clothes to the hospital/surgery center.  FAILURE TO FOLLOW THESE INSTRUCTIONS MAY RESULT IN THE CANCELLATION OF YOUR SURGERY PATIENT SIGNATURE_________________________________  NURSE SIGNATURE__________________________________  ________________________________________________________________________   Adam Phenix  An incentive spirometer is a tool that can help keep your lungs clear and active. This tool measures how well you are filling your lungs with each breath. Taking long deep breaths may help reverse or decrease the chance of developing breathing (pulmonary) problems (especially infection) following:  A long period of time when you are unable to move or be active. BEFORE THE PROCEDURE   If the spirometer includes an indicator to show your best effort, your nurse or respiratory therapist will set it to a desired goal.  If possible, sit up straight or lean slightly forward. Try not to slouch.  Hold the incentive spirometer in an upright position. INSTRUCTIONS FOR USE   Sit on the edge of your bed if possible, or sit up as far as you can in bed or on a chair.  Hold the incentive spirometer in an upright position.  Breathe out normally.  Place the mouthpiece in your mouth and seal your lips tightly around it.  Breathe in slowly and as deeply as possible, raising the piston or the ball toward the top of the column.  Hold your breath for 3-5 seconds or for as long as possible. Allow the piston or ball to fall to the bottom of the column.  Remove the mouthpiece from your mouth and breathe out normally.  Rest for a few seconds and repeat Steps 1 through 7 at least 10 times every 1-2 hours when you are awake. Take your time and take a few normal breaths between deep breaths.  The spirometer may include an indicator to show your best effort. Use the indicator as a goal to work toward during each repetition.  After each set of 10 deep breaths, practice coughing to be sure your lungs are clear. If you have an incision (the cut made at the time of surgery),  support your incision when coughing by placing a pillow or rolled up towels firmly against it. Once you are able to get out of bed, walk around indoors and cough well. You may stop using the incentive spirometer when instructed by  your caregiver.  RISKS AND COMPLICATIONS  Take your time so you do not get dizzy or light-headed.  If you are in pain, you may need to take or ask for pain medication before doing incentive spirometry. It is harder to take a deep breath if you are having pain. AFTER USE  Rest and breathe slowly and easily.  It can be helpful to keep track of a log of your progress. Your caregiver can provide you with a simple table to help with this. If you are using the spirometer at home, follow these instructions: [place] IF:   You are having difficultly using the spirometer.  You have trouble using the spirometer as often as instructed.  Your pain medication is not giving enough relief while using the spirometer.  You develop fever of 100.5 F (38.1 C) or higher. SEEK IMMEDIATE MEDICAL CARE IF:   You cough up bloody sputum that had not been present before.  You develop fever of 102 F (38.9 C) or greater.  You develop worsening pain at or near the incision site. MAKE SURE YOU:   Understand these instructions.  Will watch your condition.  Will get help right away if you are not doing well or get worse. Document Released: 11/13/2006 Document Revised: 09/25/2011 Document Reviewed: 01/14/2007 [facility] Patient Information 2014 [place], [place].

## 2016-01-18 ENCOUNTER — Emergency Department (HOSPITAL_COMMUNITY)
Admission: EM | Admit: 2016-01-18 | Discharge: 2016-01-18 | Disposition: A | Discharge status: Home / Self Care | Payer: PPO | Attending: Emergency Medicine | Admitting: Emergency Medicine

## 2016-01-18 ENCOUNTER — Encounter (HOSPITAL_COMMUNITY): Payer: Self-pay | Admitting: Emergency Medicine

## 2016-01-18 DIAGNOSIS — E119 Type 2 diabetes mellitus without complications: Secondary | ICD-10-CM | POA: Diagnosis not present

## 2016-01-18 DIAGNOSIS — M1990 Unspecified osteoarthritis, unspecified site: Secondary | ICD-10-CM | POA: Insufficient documentation

## 2016-01-18 DIAGNOSIS — M5440 Lumbago with sciatica, unspecified side: Secondary | ICD-10-CM | POA: Insufficient documentation

## 2016-01-18 DIAGNOSIS — F319 Bipolar disorder, unspecified: Secondary | ICD-10-CM | POA: Diagnosis not present

## 2016-01-18 DIAGNOSIS — E785 Hyperlipidemia, unspecified: Secondary | ICD-10-CM | POA: Diagnosis not present

## 2016-01-18 DIAGNOSIS — Z79899 Other long term (current) drug therapy: Secondary | ICD-10-CM | POA: Diagnosis not present

## 2016-01-18 DIAGNOSIS — M545 Low back pain: Secondary | ICD-10-CM | POA: Diagnosis not present

## 2016-01-18 DIAGNOSIS — Z7984 Long term (current) use of oral hypoglycemic drugs: Secondary | ICD-10-CM | POA: Diagnosis not present

## 2016-01-18 DIAGNOSIS — E039 Hypothyroidism, unspecified: Secondary | ICD-10-CM | POA: Insufficient documentation

## 2016-01-18 DIAGNOSIS — J45909 Unspecified asthma, uncomplicated: Secondary | ICD-10-CM | POA: Diagnosis not present

## 2016-01-18 DIAGNOSIS — M5126 Other intervertebral disc displacement, lumbar region: Secondary | ICD-10-CM | POA: Insufficient documentation

## 2016-01-18 DIAGNOSIS — I10 Essential (primary) hypertension: Secondary | ICD-10-CM | POA: Insufficient documentation

## 2016-01-18 MED ORDER — HYDROCODONE-ACETAMINOPHEN 10-325 MG PO TABS
1.0000 | ORAL_TABLET | Freq: Once | ORAL | Status: DC
  Filled 2016-01-18: qty 1

## 2016-01-18 MED ORDER — KETOROLAC TROMETHAMINE 15 MG/ML IJ SOLN
15.0000 mg | Freq: Once | INTRAMUSCULAR | Status: AC
  Administered 2016-01-18: 15 mg via INTRAVENOUS
  Filled 2016-01-18: qty 1

## 2016-01-18 MED ORDER — HYDROMORPHONE HCL 1 MG/ML IJ SOLN
1.0000 mg | Freq: Once | INTRAMUSCULAR | Status: AC
  Administered 2016-01-18: 1 mg via INTRAVENOUS
  Filled 2016-01-18: qty 1

## 2016-01-18 MED ORDER — SODIUM CHLORIDE 0.9 % IV BOLUS (SEPSIS): 1000.0000 mL | Freq: Once | INTRAVENOUS | Status: DC

## 2016-01-18 MED ORDER — DEXAMETHASONE SODIUM PHOSPHATE 10 MG/ML IJ SOLN
10.0000 mg | Freq: Once | INTRAMUSCULAR | Status: AC
  Administered 2016-01-18: 10 mg via INTRAVENOUS
  Filled 2016-01-18: qty 1

## 2016-01-18 MED ORDER — SODIUM CHLORIDE 0.9 % IV BOLUS (SEPSIS)
500.0000 mL | Freq: Once | INTRAVENOUS | Status: AC
  Administered 2016-01-18: 500 mL via INTRAVENOUS

## 2016-01-18 NOTE — ED Notes (Signed)
Patient is complaining of bilaterally leg pain.  The pain starts in her lower back and radiates to both legs. Denies any injury, trauma, or fall.  She has pre-op appointment tomorrow and surgery scheduled for 01-26-16.

## 2016-01-18 NOTE — Discharge Instructions (Signed)
If you were given medicines take as directed.  If you are on coumadin or contraceptives realize their levels and effectiveness is altered by many different medicines.  If you have any reaction (rash, tongues swelling, other) to the medicines stop taking and see a physician.    If your blood pressure was elevated in the ER make sure you follow up for management with a primary doctor or return for chest pain, shortness of breath or stroke symptoms.  Please follow up as directed and return to the ER or see a physician for new or worsening symptoms including leg weakness, urinary retention or incontinence, fevers.  Thank you. Filed Vitals:   01/18/16 1433  BP: 134/101  Pulse: 67  Temp: 98.2 F (36.8 C)  TempSrc: Oral  Resp: 18  Height: 5\' 3"  (1.6 m)  Weight: 293 lb (132.904 kg)  SpO2: 97%
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
## 2016-01-19 ENCOUNTER — Encounter (HOSPITAL_COMMUNITY): Payer: Self-pay

## 2016-01-19 ENCOUNTER — Ambulatory Visit (HOSPITAL_COMMUNITY)
Admission: RE | Admit: 2016-01-19 | Discharge: 2016-01-19 | Disposition: A | Discharge status: Home / Self Care | Payer: PPO | Source: Ambulatory Visit | Attending: Orthopedic Surgery | Admitting: Orthopedic Surgery

## 2016-01-19 ENCOUNTER — Ambulatory Visit: Payer: Self-pay | Admitting: Orthopedic Surgery

## 2016-01-19 ENCOUNTER — Encounter (HOSPITAL_COMMUNITY)
Admission: RE | Admit: 2016-01-19 | Discharge: 2016-01-19 | Disposition: A | Discharge status: Home / Self Care | Payer: PPO | Source: Ambulatory Visit | Attending: Specialist | Admitting: Specialist

## 2016-01-19 DIAGNOSIS — M5416 Radiculopathy, lumbar region: Secondary | ICD-10-CM | POA: Diagnosis not present

## 2016-01-19 DIAGNOSIS — M5136 Other intervertebral disc degeneration, lumbar region: Secondary | ICD-10-CM | POA: Diagnosis not present

## 2016-01-19 DIAGNOSIS — I498 Other specified cardiac arrhythmias: Secondary | ICD-10-CM | POA: Insufficient documentation

## 2016-01-19 DIAGNOSIS — Z01818 Encounter for other preprocedural examination: Secondary | ICD-10-CM | POA: Diagnosis not present

## 2016-01-19 DIAGNOSIS — M5126 Other intervertebral disc displacement, lumbar region: Secondary | ICD-10-CM

## 2016-01-19 HISTORY — DX: Anesthesia of skin: R20.0

## 2016-01-19 HISTORY — DX: Presence of spectacles and contact lenses: Z97.3

## 2016-01-19 HISTORY — DX: Unspecified cataract: H26.9

## 2016-01-19 HISTORY — DX: Tremor, unspecified: R25.1

## 2016-01-19 HISTORY — DX: Personal history of other mental and behavioral disorders: Z86.59

## 2016-01-19 HISTORY — DX: Unspecified asthma, uncomplicated: J45.909

## 2016-01-19 HISTORY — DX: Personal history of urinary (tract) infections: Z87.440

## 2016-01-19 HISTORY — DX: Post-traumatic stress disorder, unspecified: F43.10

## 2016-01-19 LAB — BASIC METABOLIC PANEL
ANION GAP: 8 (ref 5–15)
BUN: 26 mg/dL — ABNORMAL HIGH (ref 6–20)
CALCIUM: 9.2 mg/dL (ref 8.9–10.3)
CHLORIDE: 101 mmol/L (ref 101–111)
CO2: 25 mmol/L (ref 22–32)
Creatinine, Ser: 0.97 mg/dL (ref 0.44–1.00)
GFR calc non Af Amer: >60 mL/min (ref >60)
GLUCOSE: 102 mg/dL — AB (ref 65–99)
POTASSIUM: 4.8 mmol/L (ref 3.5–5.1)
Sodium: 134 mmol/L — ABNORMAL LOW (ref 135–145)

## 2016-01-19 LAB — CBC
HEMATOCRIT: 36.3 % (ref 36.0–46.0)
HEMOGLOBIN: 12.4 g/dL (ref 12.0–15.0)
MCH: 30.6 pg (ref 26.0–34.0)
MCHC: 34.2 g/dL (ref 30.0–36.0)
MCV: 89.6 fL (ref 78.0–100.0)
Platelets: 244 10*3/uL (ref 150–400)
RBC: 4.05 MIL/uL (ref 3.87–5.11)
RDW: 13.8 % (ref 11.5–15.5)
WBC: 9.3 10*3/uL (ref 4.0–10.5)

## 2016-01-19 LAB — PREGNANCY, URINE: PREG TEST UR: NEGATIVE

## 2016-01-19 NOTE — Progress Notes (Signed)
BMP results in epic per PAT visit 01/19/2016 sent to Dr Tonita Cong

## 2016-01-19 NOTE — Progress Notes (Signed)
Needing surgical orders to be placed in epic. Pt had PAT appt 01/19/2016. Thanks.

## 2016-01-19 NOTE — Progress Notes (Signed)
OV note per chart per Dr Antony Contras 12/07/2015 with A1C results noted from 12/07/2015

## 2016-01-19 NOTE — H&P (Signed)
[name] DOB: [birth year] Married / Language: [name] / Race: White Female  Chief Complaint: Back and B/L leg pain  History of Present Illness  The patient is a 50 year old female who presents with back pain. The patient is here today for a surgical consult and in referral from Dr. Nelva Bush. The patient reports low back symptoms which began 2 year(s) ago without any known injury. Symptoms are reported to be located in the low back and Symptoms include pain, numbness, burning and tingling. The pain radiates to the left buttock, left posterior thigh, right buttock and right posterior thigh. The patient feels as if the symptoms are worsening. Symptoms are exacerbated by standing and sitting. Current treatment includes nonsteroidal anti-inflammatory drugs, opioid analgesics and activity modification. Prior to being seen today the patient was previously evaluated by Dr. Nelva Bush. Past evaluation has included x-ray of the lumbar spine and MRI of the lumbar spine. Past treatment has included nonsteroidal anti-inflammatory drugs, opioid analgesics, muscle relaxants, epidural injections and activity modification.   [name] presents today for an evaluation of her back per Dr. Nelva Bush. She reports symptoms for 2+ years as far as chronic back pain progressively. She has noted worsening of pain over time over the last few months, but especially in the last two weeks where she started developing pain into the buttocks and down the posterior legs into the posterior thighs bilaterally with associated tingling, some weakness first thing in the morning. The left leg is more symptomatic than the right. She has had multiple injections by Dr. Nelva Bush over the last year plus. Her most recent one was May 10th. She had an ESI at 5-1 on the left which did seem to help temporarily with her back pain, but the legs progressively got worse after that. She reports pain with both sitting and standing. She has trouble sleeping in her  bed. She now needs to sleep on her recliner and keeps a pillow underneath her knees, which seems to help with that. She takes Percocet, takes 5 and 10 mg tablets, 5s during the day, 10s at night for more severe pain. Also takes indomethacin as needed. She denies any specific injury within the last few weeks that seemed to worsen any of the symptoms that she was having or caused the worsening leg pain. She does take care of her mother-in-law who has dementia who is not quite to the point where she needs assistance as far as being lifted, but she does do most of the cooking and cleaning in the household. She is diabetic. She is not on insulin. She is currently on metformin. The last fasting sugar that she recalls in the morning was a 140. She is not sure what her last A1c was. She states that she has had so many injections every few months that they are not sure how accurate her A1c is anyway. She has been seeing Dr. Rolena Infante for her neck and has been in PT for that. She reports she has been told she had osteoporosis when she had her knee replacements done on the right by Dr. Shellia Carwin and on the left by Dr. Gladstone Lighter. They both had told her she had findings consistent with osteoporosis. She is not sure when the last time is that she had a DEXA scan, but has never been told prior to that, that she had any osteoporosis. She is not currently taking any medications for that at this point. She reports difficulty ambulating, pain with any position as far  as her back and legs, but it does seem to be worse when she sits.  Problem List/Past Medical Hx History of total right knee replacement (Z96.651)  Impingement syndrome of right shoulder (M75.41)  Synovitis (M65.9)  Acute carpal tunnel syndrome, right (G56.01)  Degeneration of intervertebral disc at C5-C6 level (M50.322)  Plantar fasciitis, right (M72.2)  Chronic pain of both knees (M25.561, M25.562)  Orthopedic aftercare (Z47.89)  Cervical pain (M54.2)   Cervical spinal stenosis (M48.02)  Degenerative lumbar disc (M51.36)  Numbness and tingling in both hands (R20.0)  Facet arthropathy (M46.90)  Primary osteoarthritis of lumbar spine (M47.816)  Hypothyroidism  Osteoarthritis  Chronic Pain  Anxiety Disorder  Depression  Psychiatric disorder  Fibromyalgia   Allergies  Vicodin *ANALGESICS - OPIOID*  SKIN FEELS HOT- had reaction once after head trauma in ER PredniSONE (Pak) *CORTICOSTEROIDS*  Allergies Reconciled   Social History Tobacco use  Never smoker. never smoker Living situation  live with spouse Exercise  Exercises rarely Drug/Alcohol Rehab (Currently)  no Alcohol use  current drinker; drinks hard liquor; only occasionally per week Illicit drug use  no Previously in rehab  no Current work status  Disabled. Marital status  married Tobacco / smoke exposure  yes Number of flights of stairs before winded  1 Pain Contract  no Children  0 Most recent primary occupation  Housekeeping at [employer] (10-325MG  Tablet, 1 (one) Oral four times daily, as needed, Taken starting 05/07/2015) Active. Atorvastatin Calcium (10MG  Tablet, Oral) Active. Eye Vitamins & Minerals (Oral) Active. Womens Daily Multivitamin (Oral) Active. Furosemide (20MG  Tablet, Oral) Active. (qd) Glucosamine Complex (1 (one) Oral) Active. Indomethacin (25MG  Capsule, 2 (two) Oral every 12 hours, Taken starting 12/14/2015) Active. (take after meals) Levothyroxine Sodium (150MCG Capsule, Oral) Active. (qd) Lisinopril (20MG  Tablet, Oral) Active. (qd) Oxybutynin Chloride (5MG  Tablet, Oral two times daily) Active. PARoxetine HCl (40MG  Tablet, Oral) Active. MetFORMIN HCl (1000MG  Tablet, Oral) Active. ProAir HFA (108 (90 Base)MCG/ACT Aerosol Soln, Inhalation) Active. Vitamin B-6 (100MG  Tablet, Oral) Active. Vitamin C (1000MG  Tablet, Oral) Active. TraZODone HCl (50MG  Tablet, Oral)  Inactive. Medications Reconciled  Pregnancy / Birth History Pregnant  no  Past Surgical History Tonsillectomy  Carpal Tunnel Repair  bilateral Gallbladder Surgery  laporoscopic Other Orthopaedic Surgery [07/16/2014]: right shoulder SAD/DCR B/L TKA Right knee manipulation  Physical Exam On exam, she is morbidly obese. She has an antalgic gait. On examination of the lumbar spine, she has tender midline lower lumbar spine as well as bilateral paraspinus musculature, lumbosacral junction, buttocks and bilateral mild tenderness over the greater trochanters of the hips. Positive straight leg raise bilaterally reproducing buttock and posterior thigh pain. She has EHL weakness bilaterally, left slightly worse than right. Left 4/5, right 4+/5. Otherwise, her strength is 5/5, hip flexors, quads, hamstrings, plantar flexion, dorsiflexion. No calf pain or sign of DVT. Sensation intact distally.  X-rays ordered, obtained, reviewed today. No fracture, subluxation, dislocation, lytic or blastic lesions. She has mild disc degeneration 2-3 and 3-4, more significant disc degeneration at 4-5 with a spur at the posterior corner of L4 at the foramen which could cause impingement. Mild degenerative changes in the hip, more so in the left. MRI images and report, which was done last week reviewed today by Dr. Tonita Cong. Multilevel disc desiccation most noted at 2-3, 3-4, 4-5. There was a large disc herniation at L4-5, more so to the left encroaching into the foramen bilaterally and into the left lateral recess causing impingement on the descending left  L5 root. There is a small shallow disc protrusion at 2-3 and 3-4, neither of which cause any nerve root compression or central stenosis. Also some atrophy noted in the lower lumbar paraspinals.  Assessment & Plan Lumbar DDD (M51.36) Lumbar radiculitis (M54.16) Lumbar HNP (M51.26)  1. Chronic lower back pain, which is likely discogenic in nature. 2. Acute  progressively worsening bilateral lower extremity pain, left worse than right, L5 distribution with myotomal weakness and dermatomal dysesthesias consistent with her disc herniation at L4-5 refractory to epidural steroid injections, narcotic pain medications, anti-inflammatories, activity modifications, relative rest with neural tension signs on exam. 3. Morbid obesity. 4. Diabetes, questionable how well controlled. 5. Underlying osteoporosis on her knee replacements requiring what sounds to be a revision tray.  We discussed relevant anatomy in detail and etiologies of her pain. We discussed her chronic back pain being discogenic in nature, more acute symptoms that are causing her radiating symptoms with myotomal weakness, dermatomal dysesthesias and neural tension signs to be related to her disc herniation at L4-5, which was not seen on her prior MRI from 2015. We discussed the nature of the disc herniation, discussed treatment options. It is large and causing nerve root compression. She does have EHL weakness and neural tension signs. We do not feel that an epidural steroid injection at this point given the size and location and her symptoms associated with this that an epidural steroid injection would be effective nor would physical therapy. We would recommend proceeding with a microlumbar decompression L4-5. We discussed the procedure itself as well as risks, complications and alternatives including but not limited to DVT, PE, failure of procedure, need for secondary procedure, anesthesia risk, even death. She did previously test positive for MRSA, therefore we will use vanco. I have given her the MRSA decolonization scripts. She states she did test negative once after that. However, we will proceed with those decolonization prescriptions. She has no history of DVT. We discussed postop protocols, restrictions, long-term activity modifications to avoid exacerbation. Also discussed eventual possible need for  a fusion. She had inquired about effusion today given the disc degeneration noted on her x-rays. She is aware that a microlumbar decompression to address her disc herniation does not address her chronic back pain. She is aware that, that back pain is discogenic in nature. Given the fact that she has multilevel disc degeneration and defecation on her x-rays and MRI would not recommend proceeding with fusion as it would likely require up to a three level fusion to avoid adjacent segment disease above as she has disc desiccation and narrowing at 2-3 and 3-4 not only 4-5. We discussed the risks associated with the procedure such as that which would be a salvage procedure. She is aware that, that would be more for her chronic back pain. We would recommend long-term activity modifications and disc pressure management in an attempt to keep her discogenic pain at a minimum. The occasional flare-ups could be treated by epidural steroid injections. She can continue with her pain medication from Dr. Nelva Bush in the meantime. We will have her followup. We will proceed with surgery accordingly. Request clearance from her PCP and then proceed accordingly with scheduling. She will follow up 10 to 14 days postop for suture/staple removal. Call if any questions or concerns in the interim. I have discussed her cervical spine PT requirement. She can hold off on that in the meantime and we can resume at postop from her back. She will call if any questions or  concerns in the interim. The patient was seen in conjunction with Dr. Tonita Cong today.  She has predominantly left lower extremity radicular pain also on the right, refractory. She has had chronic back pain, which was tolerable and now that it is intolerable. She has some weakness associated with that.  We discussed lumbar decompression versus living with her symptoms and lumbar decompression with fusion. Problem with the fusion is that she has multilevel degenerative changes would be  a fairly involved surgery and the risk I feel are precluded by a number of comorbidities, which include diabetes, her elevated BMI, and her fibromyalgia.  There is increase in failed laminectomy syndrome a fibromyalgia due to the overlie of the myofascial disease. However, with her neural compression, certainly this is an option for a decompression only. Back to her baseline with chronic back pain, I feel the best management of that would be weight reduction, pool therapy, activity modification, strategies to avoid re-injury. She understands and I spent considerable time discussing all these issues, perioperative decolonization due to her treatment of MRSA in the past. No history of DVT.  I had an extensive discussion of the risks and benefits of the lumbar decompression with the patient including bleeding, infection, damage to neurovascular structures, epidural fibrosis, CSF leak requiring repair. We also discussed increase in pain, adjacent segment disease, recurrent disc herniation, need for future surgery including repeat decompression and/or fusion. We also discussed risks of postoperative hematoma, paralysis, anesthetic complications including DVT, PE, death, cardiopulmonary dysfunction. In addition, the perioperative and postoperative courses were discussed in detail including the rehabilitative time and return to functional activity and work. I provided the patient with an illustrated handout and utilized the appropriate surgical models.  Plan microlumbar decompression L4-5  Signed electronically by Lacie Draft, PA-C for Dr. Tonita Cong

## 2016-01-20 DIAGNOSIS — N3941 Urge incontinence: Secondary | ICD-10-CM | POA: Diagnosis not present

## 2016-01-20 DIAGNOSIS — R351 Nocturia: Secondary | ICD-10-CM | POA: Diagnosis not present

## 2016-01-24 ENCOUNTER — Ambulatory Visit (HOSPITAL_COMMUNITY): Payer: Self-pay | Admitting: Clinical

## 2016-01-25 MED ORDER — VANCOMYCIN HCL 10 G IV SOLR
1500.0000 mg | INTRAVENOUS | Status: AC
  Administered 2016-01-26: 1500 mg via INTRAVENOUS
  Filled 2016-01-25: qty 1500

## 2016-01-25 NOTE — Anesthesia Preprocedure Evaluation (Addendum)
Anesthesia Evaluation  Patient identified by MRN, date of birth, ID band Patient awake    Reviewed: Allergy & Precautions, H&P , NPO status , Patient's Chart, lab work & pertinent test results  Airway Mallampati: III  TM Distance: >3 FB Neck ROM: full    Dental no notable dental hx. (+) Teeth Intact, Dental Advisory Given   Pulmonary asthma , sleep apnea and Continuous Positive Airway Pressure Ventilation ,    Pulmonary exam normal breath sounds clear to auscultation       Cardiovascular Exercise Tolerance: Poor hypertension, Pt. on medications + angina  Rhythm:Regular Rate:Normal  ECHO EF 60% 2012, Neg stress 2012. CP with anxiety. tachycardia   Neuro/Psych Anxiety Depression Bipolar Disorder negative neurological ROS  negative psych ROS   GI/Hepatic negative GI ROS, Neg liver ROS, GERD  Medicated,  Endo/Other  diabetes, Well Controlled, Type 2, Oral Hypoglycemic AgentsHypothyroidism Morbid obesitySuper obesity  Renal/GU negative Renal ROS  negative genitourinary   Musculoskeletal   Abdominal (+) + obese,   Peds  Hematology negative hematology ROS (+)   Anesthesia Other Findings   Reproductive/Obstetrics negative OB ROS                            Anesthesia Physical Anesthesia Plan  ASA: IV  Anesthesia Plan: General   Post-op Pain Management:    Induction: Intravenous  Airway Management Planned: Oral ETT  Additional Equipment:   Intra-op Plan:   Post-operative Plan:   Informed Consent: I have reviewed the patients History and Physical, chart, labs and discussed the procedure including the risks, benefits and alternatives for the proposed anesthesia with the patient or authorized representative who has indicated his/her understanding and acceptance.   Dental advisory given  Plan Discussed with: CRNA  Anesthesia Plan Comments:         Anesthesia Quick Evaluation

## 2016-01-26 ENCOUNTER — Ambulatory Visit (HOSPITAL_COMMUNITY)
Admission: RE | Admit: 2016-01-26 | Discharge: 2016-01-28 | Disposition: A | Discharge status: Home / Self Care | Payer: PPO | Source: Ambulatory Visit | Attending: Specialist | Admitting: Specialist

## 2016-01-26 ENCOUNTER — Encounter (HOSPITAL_COMMUNITY): Payer: Self-pay | Admitting: Certified Registered Nurse Anesthetist

## 2016-01-26 ENCOUNTER — Ambulatory Visit (HOSPITAL_COMMUNITY): Payer: PPO | Admitting: Anesthesiology

## 2016-01-26 ENCOUNTER — Ambulatory Visit (HOSPITAL_COMMUNITY): Payer: PPO

## 2016-01-26 ENCOUNTER — Encounter (HOSPITAL_COMMUNITY)
Admission: RE | Disposition: A | Discharge status: Home / Self Care | Payer: Self-pay | Source: Ambulatory Visit | Attending: Specialist

## 2016-01-26 DIAGNOSIS — F410 Panic disorder [episodic paroxysmal anxiety] without agoraphobia: Secondary | ICD-10-CM | POA: Insufficient documentation

## 2016-01-26 DIAGNOSIS — I10 Essential (primary) hypertension: Secondary | ICD-10-CM | POA: Diagnosis not present

## 2016-01-26 DIAGNOSIS — M48061 Spinal stenosis, lumbar region without neurogenic claudication: Secondary | ICD-10-CM | POA: Diagnosis present

## 2016-01-26 DIAGNOSIS — M5116 Intervertebral disc disorders with radiculopathy, lumbar region: Secondary | ICD-10-CM | POA: Insufficient documentation

## 2016-01-26 DIAGNOSIS — Z6841 Body Mass Index (BMI) 40.0 and over, adult: Secondary | ICD-10-CM | POA: Diagnosis not present

## 2016-01-26 DIAGNOSIS — Z96653 Presence of artificial knee joint, bilateral: Secondary | ICD-10-CM | POA: Insufficient documentation

## 2016-01-26 DIAGNOSIS — M5126 Other intervertebral disc displacement, lumbar region: Secondary | ICD-10-CM | POA: Diagnosis not present

## 2016-01-26 DIAGNOSIS — M797 Fibromyalgia: Secondary | ICD-10-CM | POA: Diagnosis not present

## 2016-01-26 DIAGNOSIS — M961 Postlaminectomy syndrome, not elsewhere classified: Secondary | ICD-10-CM | POA: Diagnosis not present

## 2016-01-26 DIAGNOSIS — E119 Type 2 diabetes mellitus without complications: Secondary | ICD-10-CM | POA: Diagnosis not present

## 2016-01-26 DIAGNOSIS — E039 Hypothyroidism, unspecified: Secondary | ICD-10-CM | POA: Diagnosis not present

## 2016-01-26 DIAGNOSIS — F4310 Post-traumatic stress disorder, unspecified: Secondary | ICD-10-CM | POA: Insufficient documentation

## 2016-01-26 DIAGNOSIS — M810 Age-related osteoporosis without current pathological fracture: Secondary | ICD-10-CM | POA: Insufficient documentation

## 2016-01-26 DIAGNOSIS — M4806 Spinal stenosis, lumbar region: Secondary | ICD-10-CM | POA: Diagnosis not present

## 2016-01-26 DIAGNOSIS — E785 Hyperlipidemia, unspecified: Secondary | ICD-10-CM | POA: Insufficient documentation

## 2016-01-26 DIAGNOSIS — F319 Bipolar disorder, unspecified: Secondary | ICD-10-CM | POA: Diagnosis not present

## 2016-01-26 DIAGNOSIS — M5136 Other intervertebral disc degeneration, lumbar region: Secondary | ICD-10-CM | POA: Diagnosis not present

## 2016-01-26 DIAGNOSIS — Z7984 Long term (current) use of oral hypoglycemic drugs: Secondary | ICD-10-CM | POA: Diagnosis not present

## 2016-01-26 DIAGNOSIS — Z96651 Presence of right artificial knee joint: Secondary | ICD-10-CM | POA: Diagnosis not present

## 2016-01-26 DIAGNOSIS — I209 Angina pectoris, unspecified: Secondary | ICD-10-CM | POA: Diagnosis not present

## 2016-01-26 DIAGNOSIS — Z419 Encounter for procedure for purposes other than remedying health state, unspecified: Secondary | ICD-10-CM

## 2016-01-26 DIAGNOSIS — Z79899 Other long term (current) drug therapy: Secondary | ICD-10-CM | POA: Diagnosis not present

## 2016-01-26 HISTORY — PX: LUMBAR LAMINECTOMY/DECOMPRESSION MICRODISCECTOMY: SHX5026

## 2016-01-26 LAB — GLUCOSE, CAPILLARY
GLUCOSE-CAPILLARY: 119 mg/dL — AB (ref 65–99)
Glucose-Capillary: 141 mg/dL — ABNORMAL HIGH (ref 65–99)
Glucose-Capillary: 118 mg/dL — ABNORMAL HIGH (ref 65–99)
Glucose-Capillary: 104 mg/dL — ABNORMAL HIGH (ref 65–99)
Glucose-Capillary: 96 mg/dL (ref 65–99)

## 2016-01-26 LAB — TYPE AND SCREEN
ABO/RH(D): O NEG
Antibody Screen: NEGATIVE

## 2016-01-26 SURGERY — LUMBAR LAMINECTOMY/DECOMPRESSION MICRODISCECTOMY 1 LEVEL
Anesthesia: General

## 2016-01-26 MED ORDER — DOCUSATE SODIUM 100 MG PO CAPS
100.0000 mg | ORAL_CAPSULE | Freq: Two times a day (BID) | ORAL | Status: DC
  Administered 2016-01-26 – 2016-01-28 (×4): 100 mg via ORAL
  Filled 2016-01-26 (×4): qty 1

## 2016-01-26 MED ORDER — LACTATED RINGERS IV SOLN: INTRAVENOUS | Status: DC

## 2016-01-26 MED ORDER — INDOMETHACIN 25 MG PO CAPS: 50.0000 mg | ORAL_CAPSULE | Freq: Two times a day (BID) | ORAL | Status: DC

## 2016-01-26 MED ORDER — SUCCINYLCHOLINE CHLORIDE 20 MG/ML IJ SOLN
INTRAMUSCULAR | Status: DC | PRN
  Administered 2016-01-26: 140 mg via INTRAVENOUS

## 2016-01-26 MED ORDER — BISACODYL 5 MG PO TBEC: 5.0000 mg | DELAYED_RELEASE_TABLET | Freq: Every day | ORAL | Status: DC | PRN

## 2016-01-26 MED ORDER — POTASSIUM CHLORIDE IN NACL 20-0.9 MEQ/L-% IV SOLN
INTRAVENOUS | Status: DC
  Filled 2016-01-26 (×3): qty 1000

## 2016-01-26 MED ORDER — DOPAMINE-DEXTROSE 3.2-5 MG/ML-% IV SOLN
INTRAVENOUS | Status: AC
  Filled 2016-01-26: qty 250

## 2016-01-26 MED ORDER — OXYBUTYNIN CHLORIDE ER 5 MG PO TB24
5.0000 mg | ORAL_TABLET | Freq: Two times a day (BID) | ORAL | Status: DC
  Administered 2016-01-26 – 2016-01-28 (×4): 5 mg via ORAL
  Filled 2016-01-26 (×4): qty 1

## 2016-01-26 MED ORDER — ACETAMINOPHEN 325 MG PO TABS: 650.0000 mg | ORAL_TABLET | ORAL | Status: DC | PRN

## 2016-01-26 MED ORDER — ONDANSETRON HCL 4 MG/2ML IJ SOLN
INTRAMUSCULAR | Status: DC | PRN
  Administered 2016-01-26: 4 mg via INTRAVENOUS

## 2016-01-26 MED ORDER — VITAMIN B-6 100 MG PO TABS
100.0000 mg | ORAL_TABLET | Freq: Every day | ORAL | Status: DC
  Administered 2016-01-26 – 2016-01-28 (×3): 100 mg via ORAL
  Filled 2016-01-26 (×3): qty 1

## 2016-01-26 MED ORDER — TRAZODONE HCL 50 MG PO TABS
50.0000 mg | ORAL_TABLET | Freq: Every day | ORAL | Status: DC
  Administered 2016-01-26 – 2016-01-27 (×2): 50 mg via ORAL
  Filled 2016-01-26 (×2): qty 1

## 2016-01-26 MED ORDER — METHOCARBAMOL 1000 MG/10ML IJ SOLN
500.0000 mg | Freq: Four times a day (QID) | INTRAVENOUS | Status: DC | PRN
  Administered 2016-01-26 – 2016-01-27 (×2): 500 mg via INTRAVENOUS
  Filled 2016-01-26 (×2): qty 5
  Filled 2016-01-26: qty 550
  Filled 2016-01-26: qty 5

## 2016-01-26 MED ORDER — ADULT MULTIVITAMIN W/MINERALS CH
1.0000 | ORAL_TABLET | Freq: Every day | ORAL | Status: DC
  Administered 2016-01-26 – 2016-01-28 (×3): 1 via ORAL
  Filled 2016-01-26 (×3): qty 1

## 2016-01-26 MED ORDER — LIDOCAINE HCL (CARDIAC) 20 MG/ML IV SOLN
INTRAVENOUS | Status: AC
  Filled 2016-01-26: qty 5

## 2016-01-26 MED ORDER — THROMBIN 5000 UNITS EX SOLR
CUTANEOUS | Status: AC
  Filled 2016-01-26: qty 10000

## 2016-01-26 MED ORDER — FUROSEMIDE 40 MG PO TABS
40.0000 mg | ORAL_TABLET | Freq: Every morning | ORAL | Status: DC
  Administered 2016-01-26 – 2016-01-28 (×3): 40 mg via ORAL
  Filled 2016-01-26 (×3): qty 1

## 2016-01-26 MED ORDER — ROCURONIUM BROMIDE 100 MG/10ML IV SOLN
INTRAVENOUS | Status: AC
  Filled 2016-01-26: qty 1

## 2016-01-26 MED ORDER — POLYETHYLENE GLYCOL 3350 17 G PO PACK: 17.0000 g | PACK | Freq: Every day | ORAL | Status: DC

## 2016-01-26 MED ORDER — POLYETHYLENE GLYCOL 3350 17 G PO PACK: 17.0000 g | PACK | Freq: Every day | ORAL | Status: DC | PRN

## 2016-01-26 MED ORDER — RISAQUAD PO CAPS
1.0000 | ORAL_CAPSULE | Freq: Every day | ORAL | Status: DC
  Administered 2016-01-26 – 2016-01-28 (×3): 1 via ORAL
  Filled 2016-01-26 (×3): qty 1

## 2016-01-26 MED ORDER — METFORMIN HCL ER 500 MG PO TB24
1000.0000 mg | ORAL_TABLET | Freq: Two times a day (BID) | ORAL | Status: DC
  Administered 2016-01-27 – 2016-01-28 (×3): 1000 mg via ORAL
  Filled 2016-01-26 (×3): qty 2

## 2016-01-26 MED ORDER — HYDROMORPHONE HCL 1 MG/ML IJ SOLN
1.0000 mg | INTRAMUSCULAR | Status: DC | PRN
  Administered 2016-01-26 – 2016-01-27 (×8): 1 mg via INTRAVENOUS
  Filled 2016-01-26 (×9): qty 1

## 2016-01-26 MED ORDER — HYDROMORPHONE HCL 1 MG/ML IJ SOLN
INTRAMUSCULAR | Status: AC
  Administered 2016-01-26: 1 mg via INTRAVENOUS
  Filled 2016-01-26: qty 1

## 2016-01-26 MED ORDER — METHOCARBAMOL 500 MG PO TABS
500.0000 mg | ORAL_TABLET | Freq: Four times a day (QID) | ORAL | Status: DC | PRN
  Administered 2016-01-26 – 2016-01-28 (×5): 500 mg via ORAL
  Filled 2016-01-26 (×5): qty 1

## 2016-01-26 MED ORDER — FENTANYL CITRATE (PF) 100 MCG/2ML IJ SOLN
INTRAMUSCULAR | Status: DC | PRN
  Administered 2016-01-26 (×5): 50 ug via INTRAVENOUS

## 2016-01-26 MED ORDER — OXYCODONE HCL 5 MG PO TABS: 5.0000 mg | ORAL_TABLET | ORAL | Status: DC | PRN

## 2016-01-26 MED ORDER — METHOCARBAMOL 500 MG PO TABS: 500.0000 mg | ORAL_TABLET | Freq: Four times a day (QID) | ORAL | Status: DC | PRN

## 2016-01-26 MED ORDER — BUPIVACAINE-EPINEPHRINE 0.5% -1:200000 IJ SOLN
INTRAMUSCULAR | Status: DC | PRN
  Administered 2016-01-26: 19 mL

## 2016-01-26 MED ORDER — LIDOCAINE HCL (CARDIAC) 20 MG/ML IV SOLN
INTRAVENOUS | Status: DC | PRN
  Administered 2016-01-26: 100 mg via INTRAVENOUS

## 2016-01-26 MED ORDER — SODIUM CHLORIDE 0.9 % IJ SOLN
INTRAMUSCULAR | Status: AC
  Filled 2016-01-26: qty 10

## 2016-01-26 MED ORDER — EPHEDRINE SULFATE 50 MG/ML IJ SOLN
INTRAMUSCULAR | Status: AC
  Filled 2016-01-26: qty 1

## 2016-01-26 MED ORDER — ACETAMINOPHEN 10 MG/ML IV SOLN
INTRAVENOUS | Status: DC | PRN
  Administered 2016-01-26: 1000 mg via INTRAVENOUS

## 2016-01-26 MED ORDER — MIDAZOLAM HCL 5 MG/5ML IJ SOLN
INTRAMUSCULAR | Status: DC | PRN
  Administered 2016-01-26: 0.5 mg via INTRAVENOUS
  Administered 2016-01-26: 1 mg via INTRAVENOUS
  Administered 2016-01-26: 0.5 mg via INTRAVENOUS

## 2016-01-26 MED ORDER — HYDROMORPHONE HCL 1 MG/ML IJ SOLN
INTRAMUSCULAR | Status: AC
  Filled 2016-01-26: qty 1

## 2016-01-26 MED ORDER — PAROXETINE HCL 20 MG PO TABS
40.0000 mg | ORAL_TABLET | Freq: Every day | ORAL | Status: DC
  Administered 2016-01-26 – 2016-01-28 (×3): 40 mg via ORAL
  Filled 2016-01-26 (×3): qty 2

## 2016-01-26 MED ORDER — INSULIN ASPART 100 UNIT/ML ~~LOC~~ SOLN: 0.0000 [IU] | Freq: Three times a day (TID) | SUBCUTANEOUS | Status: DC

## 2016-01-26 MED ORDER — BUPIVACAINE-EPINEPHRINE (PF) 0.5% -1:200000 IJ SOLN
INTRAMUSCULAR | Status: AC
  Filled 2016-01-26: qty 30

## 2016-01-26 MED ORDER — DOCUSATE SODIUM 100 MG PO CAPS: 100.0000 mg | ORAL_CAPSULE | Freq: Two times a day (BID) | ORAL | Status: DC | PRN

## 2016-01-26 MED ORDER — MAGNESIUM CITRATE PO SOLN
1.0000 | Freq: Once | ORAL | Status: DC | PRN
Start: 2016-01-26 — End: 2016-01-28

## 2016-01-26 MED ORDER — MENTHOL 3 MG MT LOZG: 1.0000 | LOZENGE | OROMUCOSAL | Status: DC | PRN

## 2016-01-26 MED ORDER — HYDROMORPHONE HCL 1 MG/ML IJ SOLN
0.2500 mg | INTRAMUSCULAR | Status: DC | PRN
  Administered 2016-01-26 (×4): 0.5 mg via INTRAVENOUS

## 2016-01-26 MED ORDER — PROPOFOL 10 MG/ML IV BOLUS
INTRAVENOUS | Status: AC
  Filled 2016-01-26: qty 20

## 2016-01-26 MED ORDER — KCL IN DEXTROSE-NACL 20-5-0.45 MEQ/L-%-% IV SOLN
INTRAVENOUS | Status: AC
  Administered 2016-01-26: 50 mL/h via INTRAVENOUS
  Filled 2016-01-26 (×2): qty 1000

## 2016-01-26 MED ORDER — ACETAMINOPHEN 650 MG RE SUPP: 650.0000 mg | RECTAL | Status: DC | PRN

## 2016-01-26 MED ORDER — VANCOMYCIN HCL 10 G IV SOLR
1500.0000 mg | Freq: Once | INTRAVENOUS | Status: AC
  Administered 2016-01-26: 1500 mg via INTRAVENOUS
  Filled 2016-01-26: qty 1500

## 2016-01-26 MED ORDER — PHENOL 1.4 % MT LIQD
1.0000 | OROMUCOSAL | Status: DC | PRN
  Filled 2016-01-26: qty 177

## 2016-01-26 MED ORDER — ALUM & MAG HYDROXIDE-SIMETH 200-200-20 MG/5ML PO SUSP: 30.0000 mL | Freq: Four times a day (QID) | ORAL | Status: DC | PRN

## 2016-01-26 MED ORDER — VITAMIN C 500 MG PO TABS
500.0000 mg | ORAL_TABLET | Freq: Every day | ORAL | Status: DC
  Administered 2016-01-26 – 2016-01-27 (×2): 500 mg via ORAL
  Filled 2016-01-26: qty 1

## 2016-01-26 MED ORDER — SODIUM CHLORIDE 0.9 % IR SOLN
Status: AC
  Filled 2016-01-26: qty 1

## 2016-01-26 MED ORDER — OCUVITE-LUTEIN PO CAPS
ORAL_CAPSULE | Freq: Two times a day (BID) | ORAL | Status: DC
  Administered 2016-01-26: 1 via ORAL
  Filled 2016-01-26 (×2): qty 1

## 2016-01-26 MED ORDER — MIDAZOLAM HCL 2 MG/2ML IJ SOLN
INTRAMUSCULAR | Status: AC
  Filled 2016-01-26: qty 2

## 2016-01-26 MED ORDER — ONDANSETRON HCL 4 MG/2ML IJ SOLN: 4.0000 mg | INTRAMUSCULAR | Status: DC | PRN

## 2016-01-26 MED ORDER — POLYMYXIN B SULFATE 500000 UNITS IJ SOLR
INTRAMUSCULAR | Status: DC | PRN
  Administered 2016-01-26: 500 mL

## 2016-01-26 MED ORDER — PROPOFOL 10 MG/ML IV BOLUS
INTRAVENOUS | Status: DC | PRN
  Administered 2016-01-26: 200 mg via INTRAVENOUS

## 2016-01-26 MED ORDER — OXYCODONE HCL 5 MG PO TABS
10.0000 mg | ORAL_TABLET | ORAL | Status: DC | PRN
Start: 2016-01-26 — End: 2016-01-27
  Administered 2016-01-26 – 2016-01-27 (×5): 10 mg via ORAL
  Filled 2016-01-26 (×5): qty 2

## 2016-01-26 MED ORDER — LACTATED RINGERS IV SOLN
INTRAVENOUS | Status: DC | PRN
  Administered 2016-01-26 (×2): via INTRAVENOUS

## 2016-01-26 MED ORDER — LEVOTHYROXINE SODIUM 75 MCG PO TABS
150.0000 ug | ORAL_TABLET | Freq: Every day | ORAL | Status: DC
  Administered 2016-01-27 – 2016-01-28 (×2): 150 ug via ORAL
  Filled 2016-01-26 (×2): qty 2

## 2016-01-26 MED ORDER — ONDANSETRON HCL 4 MG/2ML IJ SOLN
INTRAMUSCULAR | Status: AC
  Filled 2016-01-26: qty 2

## 2016-01-26 MED ORDER — ALBUTEROL SULFATE HFA 108 (90 BASE) MCG/ACT IN AERS: 1.0000 | INHALATION_SPRAY | Freq: Four times a day (QID) | RESPIRATORY_TRACT | Status: DC | PRN

## 2016-01-26 MED ORDER — SUGAMMADEX SODIUM 500 MG/5ML IV SOLN
INTRAVENOUS | Status: DC | PRN
  Administered 2016-01-26: 250 mg via INTRAVENOUS

## 2016-01-26 MED ORDER — ROCURONIUM BROMIDE 100 MG/10ML IV SOLN
INTRAVENOUS | Status: DC | PRN
  Administered 2016-01-26 (×2): 10 mg via INTRAVENOUS
  Administered 2016-01-26: 30 mg via INTRAVENOUS

## 2016-01-26 MED ORDER — FENTANYL CITRATE (PF) 250 MCG/5ML IJ SOLN
INTRAMUSCULAR | Status: AC
  Filled 2016-01-26: qty 5

## 2016-01-26 SURGICAL SUPPLY — 56 items
AGENT HMST SPONGE THK3/8 (HEMOSTASIS)
BAG SPEC THK2 15X12 ZIP CLS (MISCELLANEOUS)
BAG ZIPLOCK 12X15 (MISCELLANEOUS) IMPLANT
CLEANER TIP ELECTROSURG 2X2 (MISCELLANEOUS) ×2 IMPLANT
CLOTH 2% CHLOROHEXIDINE 3PK (PERSONAL CARE ITEMS) ×2 IMPLANT
DRAPE INCISE IOBAN 66X45 STRL (DRAPES) ×1 IMPLANT
DRAPE MICROSCOPE LEICA (MISCELLANEOUS) ×2 IMPLANT
DRAPE POUCH INSTRU U-SHP 10X18 (DRAPES) ×2 IMPLANT
DRAPE SHEET LG 3/4 BI-LAMINATE (DRAPES) ×2 IMPLANT
DRAPE SURG 17X11 SM STRL (DRAPES) ×2 IMPLANT
DRAPE UTILITY XL STRL (DRAPES) ×2 IMPLANT
DRESSING AQUACEL AG SP 3.5X6 (GAUZE/BANDAGES/DRESSINGS) IMPLANT
DRSG AQUACEL AG ADV 3.5X 4 (GAUZE/BANDAGES/DRESSINGS) IMPLANT
DRSG AQUACEL AG ADV 3.5X 6 (GAUZE/BANDAGES/DRESSINGS) IMPLANT
DRSG AQUACEL AG SP 3.5X6 (GAUZE/BANDAGES/DRESSINGS) ×2
DURAPREP 26ML APPLICATOR (WOUND CARE) ×2 IMPLANT
DURASEAL SPINE SEALANT 3ML (MISCELLANEOUS) IMPLANT
ELECT BLADE TIP CTD 4 INCH (ELECTRODE) IMPLANT
ELECT REM PT RETURN 9FT ADLT (ELECTROSURGICAL) ×2
ELECTRODE REM PT RTRN 9FT ADLT (ELECTROSURGICAL) ×1 IMPLANT
GLOVE BIOGEL PI IND STRL 7.0 (GLOVE) ×1 IMPLANT
GLOVE BIOGEL PI INDICATOR 7.0 (GLOVE) ×1
GLOVE SURG SS PI 7.0 STRL IVOR (GLOVE) ×2 IMPLANT
GLOVE SURG SS PI 7.5 STRL IVOR (GLOVE) ×2 IMPLANT
GLOVE SURG SS PI 8.0 STRL IVOR (GLOVE) ×4 IMPLANT
GOWN STRL REUS W/TWL XL LVL3 (GOWN DISPOSABLE) ×4 IMPLANT
HEMOSTAT SPONGE AVITENE ULTRA (HEMOSTASIS) IMPLANT
IV CATH 14GX2 1/4 (CATHETERS) ×2 IMPLANT
KIT BASIN OR (CUSTOM PROCEDURE TRAY) ×2 IMPLANT
KIT POSITIONING SURG ANDREWS (MISCELLANEOUS) ×2 IMPLANT
MANIFOLD NEPTUNE II (INSTRUMENTS) ×2 IMPLANT
NDL SPNL 18GX3.5 QUINCKE PK (NEEDLE) ×2 IMPLANT
NEEDLE SPNL 18GX3.5 QUINCKE PK (NEEDLE) ×4 IMPLANT
PACK LAMINECTOMY ORTHO (CUSTOM PROCEDURE TRAY) ×2 IMPLANT
PATTIES SURGICAL .5 X.5 (GAUZE/BANDAGES/DRESSINGS) IMPLANT
PATTIES SURGICAL .75X.75 (GAUZE/BANDAGES/DRESSINGS) IMPLANT
PATTIES SURGICAL 1X1 (DISPOSABLE) IMPLANT
RUBBERBAND STERILE (MISCELLANEOUS) ×4 IMPLANT
SPONGE SURGIFOAM ABS GEL 100 (HEMOSTASIS) ×2 IMPLANT
STAPLER VISISTAT (STAPLE) ×1 IMPLANT
STRIP CLOSURE SKIN 1/2X4 (GAUZE/BANDAGES/DRESSINGS) ×2 IMPLANT
SUCTION FRAZIER HANDLE 10FR (MISCELLANEOUS) ×1
SUCTION TUBE FRAZIER 10FR DISP (MISCELLANEOUS) IMPLANT
SUT NURALON 4 0 TR CR/8 (SUTURE) IMPLANT
SUT PROLENE 3 0 PS 2 (SUTURE) ×2 IMPLANT
SUT VIC AB 1 CT1 27 (SUTURE) ×4
SUT VIC AB 1 CT1 27XBRD ANTBC (SUTURE) ×2 IMPLANT
SUT VIC AB 1-0 CT2 27 (SUTURE) ×2 IMPLANT
SUT VIC AB 2-0 CT1 27 (SUTURE) ×2
SUT VIC AB 2-0 CT1 TAPERPNT 27 (SUTURE) IMPLANT
SUT VIC AB 2-0 CT2 27 (SUTURE) ×2 IMPLANT
SYR 3ML LL SCALE MARK (SYRINGE) IMPLANT
TOWEL OR 17X26 10 PK STRL BLUE (TOWEL DISPOSABLE) ×2 IMPLANT
TOWEL OR NON WOVEN STRL DISP B (DISPOSABLE) IMPLANT
TRAY FOLEY CATH 14FR (SET/KITS/TRAYS/PACK) ×1 IMPLANT
YANKAUER SUCT BULB TIP NO VENT (SUCTIONS) ×1 IMPLANT

## 2016-01-26 NOTE — H&P (View-Only) (Signed)
[name] DOB: 09-17-1965 Married / Language: Cleophus Molt / Race: White Female  Chief Complaint: Back and B/L leg pain  History of Present Illness  The patient is a 50 year old female who presents with back pain. The patient is here today for a surgical consult and in referral from Dr. Nelva Bush. The patient reports low back symptoms which began 2 year(s) ago without any known injury. Symptoms are reported to be located in the low back and Symptoms include pain, numbness, burning and tingling. The pain radiates to the left buttock, left posterior thigh, right buttock and right posterior thigh. The patient feels as if the symptoms are worsening. Symptoms are exacerbated by standing and sitting. Current treatment includes nonsteroidal anti-inflammatory drugs, opioid analgesics and activity modification. Prior to being seen today the patient was previously evaluated by Dr. Nelva Bush. Past evaluation has included x-ray of the lumbar spine and MRI of the lumbar spine. Past treatment has included nonsteroidal anti-inflammatory drugs, opioid analgesics, muscle relaxants, epidural injections and activity modification.   [name] presents today for an evaluation of her back per Dr. Nelva Bush. She reports symptoms for 2+ years as far as chronic back pain progressively. She has noted worsening of pain over time over the last few months, but especially in the last two weeks where she started developing pain into the buttocks and down the posterior legs into the posterior thighs bilaterally with associated tingling, some weakness first thing in the morning. The left leg is more symptomatic than the right. She has had multiple injections by Dr. Nelva Bush over the last year plus. Her most recent one was May 10th. She had an ESI at 5-1 on the left which did seem to help temporarily with her back pain, but the legs progressively got worse after that. She reports pain with both sitting and standing. She has trouble sleeping in her  bed. She now needs to sleep on her recliner and keeps a pillow underneath her knees, which seems to help with that. She takes Percocet, takes 5 and 10 mg tablets, 5s during the day, 10s at night for more severe pain. Also takes indomethacin as needed. She denies any specific injury within the last few weeks that seemed to worsen any of the symptoms that she was having or caused the worsening leg pain. She does take care of her mother-in-law who has dementia who is not quite to the point where she needs assistance as far as being lifted, but she does do most of the cooking and cleaning in the household. She is diabetic. She is not on insulin. She is currently on metformin. The last fasting sugar that she recalls in the morning was a 140. She is not sure what her last A1c was. She states that she has had so many injections every few months that they are not sure how accurate her A1c is anyway. She has been seeing Dr. Rolena Infante for her neck and has been in PT for that. She reports she has been told she had osteoporosis when she had her knee replacements done on the right by Dr. Shellia Carwin and on the left by Dr. Gladstone Lighter. They both had told her she had findings consistent with osteoporosis. She is not sure when the last time is that she had a DEXA scan, but has never been told prior to that, that she had any osteoporosis. She is not currently taking any medications for that at this point. She reports difficulty ambulating, pain with any position as far  as her back and legs, but it does seem to be worse when she sits.  Problem List/Past Medical Hx History of total right knee replacement (Z96.651)  Impingement syndrome of right shoulder (M75.41)  Synovitis (M65.9)  Acute carpal tunnel syndrome, right (G56.01)  Degeneration of intervertebral disc at C5-C6 level (M50.322)  Plantar fasciitis, right (M72.2)  Chronic pain of both knees (M25.561, M25.562)  Orthopedic aftercare (Z47.89)  Cervical pain (M54.2)   Cervical spinal stenosis (M48.02)  Degenerative lumbar disc (M51.36)  Numbness and tingling in both hands (R20.0)  Facet arthropathy (M46.90)  Primary osteoarthritis of lumbar spine (M47.816)  Hypothyroidism  Osteoarthritis  Chronic Pain  Anxiety Disorder  Depression  Psychiatric disorder  Fibromyalgia   Allergies  Vicodin *ANALGESICS - OPIOID*  SKIN FEELS HOT- had reaction once after head trauma in ER PredniSONE (Pak) *CORTICOSTEROIDS*  Allergies Reconciled   Social History Tobacco use  Never smoker. never smoker Living situation  live with spouse Exercise  Exercises rarely Drug/Alcohol Rehab (Currently)  no Alcohol use  current drinker; drinks hard liquor; only occasionally per week Illicit drug use  no Previously in rehab  no Current work status  Disabled. Marital status  married Tobacco / smoke exposure  yes Number of flights of stairs before winded  1 Pain Contract  no Children  0 Most recent primary occupation  Housekeeping at [employer] (10-325MG  Tablet, 1 (one) Oral four times daily, as needed, Taken starting 05/07/2015) Active. Atorvastatin Calcium (10MG  Tablet, Oral) Active. Eye Vitamins & Minerals (Oral) Active. Womens Daily Multivitamin (Oral) Active. Furosemide (20MG  Tablet, Oral) Active. (qd) Glucosamine Complex (1 (one) Oral) Active. Indomethacin (25MG  Capsule, 2 (two) Oral every 12 hours, Taken starting 12/14/2015) Active. (take after meals) Levothyroxine Sodium (150MCG Capsule, Oral) Active. (qd) Lisinopril (20MG  Tablet, Oral) Active. (qd) Oxybutynin Chloride (5MG  Tablet, Oral two times daily) Active. PARoxetine HCl (40MG  Tablet, Oral) Active. MetFORMIN HCl (1000MG  Tablet, Oral) Active. ProAir HFA (108 (90 Base)MCG/ACT Aerosol Soln, Inhalation) Active. Vitamin B-6 (100MG  Tablet, Oral) Active. Vitamin C (1000MG  Tablet, Oral) Active. TraZODone HCl (50MG  Tablet, Oral)  Inactive. Medications Reconciled  Pregnancy / Birth History Pregnant  no  Past Surgical History Tonsillectomy  Carpal Tunnel Repair  bilateral Gallbladder Surgery  laporoscopic Other Orthopaedic Surgery [07/16/2014]: right shoulder SAD/DCR B/L TKA Right knee manipulation  Physical Exam On exam, she is morbidly obese. She has an antalgic gait. On examination of the lumbar spine, she has tender midline lower lumbar spine as well as bilateral paraspinus musculature, lumbosacral junction, buttocks and bilateral mild tenderness over the greater trochanters of the hips. Positive straight leg raise bilaterally reproducing buttock and posterior thigh pain. She has EHL weakness bilaterally, left slightly worse than right. Left 4/5, right 4+/5. Otherwise, her strength is 5/5, hip flexors, quads, hamstrings, plantar flexion, dorsiflexion. No calf pain or sign of DVT. Sensation intact distally.  X-rays ordered, obtained, reviewed today. No fracture, subluxation, dislocation, lytic or blastic lesions. She has mild disc degeneration 2-3 and 3-4, more significant disc degeneration at 4-5 with a spur at the posterior corner of L4 at the foramen which could cause impingement. Mild degenerative changes in the hip, more so in the left. MRI images and report, which was done last week reviewed today by Dr. Tonita Cong. Multilevel disc desiccation most noted at 2-3, 3-4, 4-5. There was a large disc herniation at L4-5, more so to the left encroaching into the foramen bilaterally and into the left lateral recess causing impingement on the descending left  L5 root. There is a small shallow disc protrusion at 2-3 and 3-4, neither of which cause any nerve root compression or central stenosis. Also some atrophy noted in the lower lumbar paraspinals.  Assessment & Plan Lumbar DDD (M51.36) Lumbar radiculitis (M54.16) Lumbar HNP (M51.26)  1. Chronic lower back pain, which is likely discogenic in nature. 2. Acute  progressively worsening bilateral lower extremity pain, left worse than right, L5 distribution with myotomal weakness and dermatomal dysesthesias consistent with her disc herniation at L4-5 refractory to epidural steroid injections, narcotic pain medications, anti-inflammatories, activity modifications, relative rest with neural tension signs on exam. 3. Morbid obesity. 4. Diabetes, questionable how well controlled. 5. Underlying osteoporosis on her knee replacements requiring what sounds to be a revision tray.  We discussed relevant anatomy in detail and etiologies of her pain. We discussed her chronic back pain being discogenic in nature, more acute symptoms that are causing her radiating symptoms with myotomal weakness, dermatomal dysesthesias and neural tension signs to be related to her disc herniation at L4-5, which was not seen on her prior MRI from 2015. We discussed the nature of the disc herniation, discussed treatment options. It is large and causing nerve root compression. She does have EHL weakness and neural tension signs. We do not feel that an epidural steroid injection at this point given the size and location and her symptoms associated with this that an epidural steroid injection would be effective nor would physical therapy. We would recommend proceeding with a microlumbar decompression L4-5. We discussed the procedure itself as well as risks, complications and alternatives including but not limited to DVT, PE, failure of procedure, need for secondary procedure, anesthesia risk, even death. She did previously test positive for MRSA, therefore we will use vanco. I have given her the MRSA decolonization scripts. She states she did test negative once after that. However, we will proceed with those decolonization prescriptions. She has no history of DVT. We discussed postop protocols, restrictions, long-term activity modifications to avoid exacerbation. Also discussed eventual possible need for  a fusion. She had inquired about effusion today given the disc degeneration noted on her x-rays. She is aware that a microlumbar decompression to address her disc herniation does not address her chronic back pain. She is aware that, that back pain is discogenic in nature. Given the fact that she has multilevel disc degeneration and defecation on her x-rays and MRI would not recommend proceeding with fusion as it would likely require up to a three level fusion to avoid adjacent segment disease above as she has disc desiccation and narrowing at 2-3 and 3-4 not only 4-5. We discussed the risks associated with the procedure such as that which would be a salvage procedure. She is aware that, that would be more for her chronic back pain. We would recommend long-term activity modifications and disc pressure management in an attempt to keep her discogenic pain at a minimum. The occasional flare-ups could be treated by epidural steroid injections. She can continue with her pain medication from Dr. Nelva Bush in the meantime. We will have her followup. We will proceed with surgery accordingly. Request clearance from her PCP and then proceed accordingly with scheduling. She will follow up 10 to 14 days postop for suture/staple removal. Call if any questions or concerns in the interim. I have discussed her cervical spine PT requirement. She can hold off on that in the meantime and we can resume at postop from her back. She will call if any questions or  concerns in the interim. The patient was seen in conjunction with Dr. Tonita Cong today.  She has predominantly left lower extremity radicular pain also on the right, refractory. She has had chronic back pain, which was tolerable and now that it is intolerable. She has some weakness associated with that.  We discussed lumbar decompression versus living with her symptoms and lumbar decompression with fusion. Problem with the fusion is that she has multilevel degenerative changes would be  a fairly involved surgery and the risk I feel are precluded by a number of comorbidities, which include diabetes, her elevated BMI, and her fibromyalgia.  There is increase in failed laminectomy syndrome a fibromyalgia due to the overlie of the myofascial disease. However, with her neural compression, certainly this is an option for a decompression only. Back to her baseline with chronic back pain, I feel the best management of that would be weight reduction, pool therapy, activity modification, strategies to avoid re-injury. She understands and I spent considerable time discussing all these issues, perioperative decolonization due to her treatment of MRSA in the past. No history of DVT.  I had an extensive discussion of the risks and benefits of the lumbar decompression with the patient including bleeding, infection, damage to neurovascular structures, epidural fibrosis, CSF leak requiring repair. We also discussed increase in pain, adjacent segment disease, recurrent disc herniation, need for future surgery including repeat decompression and/or fusion. We also discussed risks of postoperative hematoma, paralysis, anesthetic complications including DVT, PE, death, cardiopulmonary dysfunction. In addition, the perioperative and postoperative courses were discussed in detail including the rehabilitative time and return to functional activity and work. I provided the patient with an illustrated handout and utilized the appropriate surgical models.  Plan microlumbar decompression L4-5  Signed electronically by Lacie Draft, PA-C for Dr. Tonita Cong

## 2016-01-26 NOTE — Anesthesia Procedure Notes (Addendum)
Procedure Name: Intubation Date/Time: 01/26/2016 8:49 AM Performed by: Ofilia Neas Pre-anesthesia Checklist: Patient identified, Emergency Drugs available, Suction available, Patient being monitored and Timeout performed Patient Re-evaluated:Patient Re-evaluated prior to inductionOxygen Delivery Method: Circle system utilized Preoxygenation: Pre-oxygenation with 100% oxygen Intubation Type: IV induction Ventilation: Mask ventilation without difficulty Laryngoscope Size: Mac and 4 Grade View: Grade I Tube type: Oral Tube size: 7.5 mm Number of attempts: 1 Airway Equipment and Method: Stylet Placement Confirmation: ETT inserted through vocal cords under direct vision,  positive ETCO2 and breath sounds checked- equal and bilateral Secured at: 21 cm Tube secured with: Tape Dental Injury: Teeth and Oropharynx as per pre-operative assessment

## 2016-01-26 NOTE — Transfer of Care (Signed)
Immediate Anesthesia Transfer of Care Note  Patient: [name]  Procedure(s) Performed: Procedure(s): MICRO LUMBER L4-L5 (N/A)  Patient Location: PACU  Anesthesia Type:General  Level of Consciousness: awake, oriented, patient cooperative, lethargic and responds to stimulation  Airway & Oxygen Therapy: Patient Spontanous Breathing and Patient connected to face mask oxygen  Post-op Assessment: Report given to RN, Post -op Vital signs reviewed and stable and Patient moving all extremities  Post vital signs: Reviewed and stable  Last Vitals:  Filed Vitals:   01/26/16 0712  BP: 116/72  Pulse: 99  Temp: 36.9 C  Resp: 18    Last Pain:  Filed Vitals:   01/26/16 0713  PainSc: 4       Patients Stated Pain Goal: 4 ([id number])  Complications: No apparent anesthesia complications

## 2016-01-26 NOTE — Brief Op Note (Signed)
01/26/2016  10:28 AM  PATIENT:  [name]  50 y.o. female  PRE-OPERATIVE DIAGNOSIS:  LUMBER HNP  POST-OPERATIVE DIAGNOSIS:  LUMBER HNP  PROCEDURE:  Procedure(s): MICRO LUMBER L4-L5 (N/A)  SURGEON:  Surgeon(s) and Role:    * Susa Day, MD - Primary  PHYSICIAN ASSISTANT:   ASSISTANTS: [place]   ANESTHESIA:   general  EBL:  Total I/O In: 1000 [I.V.:1000] Out: 25 [Blood:25]  BLOOD ADMINISTERED:none  DRAINS: none   LOCAL MEDICATIONS USED:  MARCAINE     SPECIMEN:  Source of Specimen:  L45  DISPOSITION OF SPECIMEN:  PATHOLOGY  COUNTS:  YES  TOURNIQUET:  * No tourniquets in log *  DICTATION: .Other Dictation: Dictation Number [id number]  PLAN OF CARE: Admit for overnight observation  PATIENT DISPOSITION:  PACU - hemodynamically stable.   Delay start of Pharmacological VTE agent (>24hrs) due to surgical blood loss or risk of bleeding: yes

## 2016-01-26 NOTE — Discharge Instructions (Signed)
Walk As Tolerated utilizing back precautions.  No bending, twisting, or lifting.  No driving for 2 weeks.   Aquacel dressing may remain in place until follow up. May shower with aquacel dressing in place. If the dressing peels off or becomes saturated, you may remove aquacel dressing and place gauze and tape dressing which should be kept clean and dry and changed daily. Do not remove steri-strips if they are present. See Dr. Tonita Cong in office in 10 to 14 days. Begin taking aspirin 81mg  per day starting 4 days after your surgery if not allergic to aspirin or on another blood thinner. Walk daily even outside. Use a cane or walker only if necessary. Avoid sitting on soft sofas. Walk As Tolerated utilizing back precautions.  No bending, twisting, or lifting.  No driving for 2 weeks.   Aquacel dressing may remain in place until follow up. May shower with aquacel dressing in place. If the dressing peels off or becomes saturated, you may remove aquacel dressing and place gauze and tape dressing which should be kept clean and dry and changed daily. Do not remove steri-strips if they are present. See Dr. Tonita Cong in office in 10 to 14 days. Begin taking aspirin 81mg  per day starting 4 days after your surgery if not allergic to aspirin or on another blood thinner. Walk daily even outside. Use a cane or walker only if necessary. Avoid sitting on soft sofas.

## 2016-01-26 NOTE — Progress Notes (Signed)
PT Cancellation Note  Patient Details Name: [name] MRN: [id number] DOB: [birth year]   Cancelled Treatment:    Reason Eval/Treat Not Completed: Pain limiting ability to participate, not ready.    Claretha Cooper 01/26/2016, 2:34 PM Tresa Endo PT [phone]

## 2016-01-26 NOTE — Progress Notes (Signed)
Pharmacy Antibiotic Note  [name] is a 50 y.o. female admitted on 01/26/2016. She is s/p microlumbar decompression L4-L5. Per MD note, patient has tested positive for MRSA in the past and therefore received Vancomycin pre-operatively. Pharmacy consulted to dose Vancomycin x 1 dose post-operatively for surgical prophylaxis. Patient does not have a drain in place per RN.   Plan: Give Vancomycin 1500mg  IV x 1 dose at 2000.    Height: 5\' 3"  (160 cm) Weight: 295 lb (133.811 kg) IBW/kg (Calculated) : 52.4  Temp (24hrs), Avg:98.2 F (36.8 C), Min:97.6 F (36.4 C), Max:98.7 F (37.1 C)  No results for input(s): WBC, CREATININE, LATICACIDVEN, VANCOTROUGH, VANCOPEAK, VANCORANDOM, GENTTROUGH, GENTPEAK, GENTRANDOM, TOBRATROUGH, TOBRAPEAK, TOBRARND, AMIKACINPEAK, AMIKACINTROU, AMIKACIN in the last 168 hours.  Estimated Creatinine Clearance: 95.2 mL/min (by C-G formula based on Cr of 0.97).    Allergies  Allergen Reactions  . Prednisolone Hives    Can tolerate methylprednisone  . Prednisone Hives  . Vicodin [Hydrocodone-Acetaminophen] Other (See Comments)    Felt very hot    Antimicrobials this admission: 7/12 >> Vancomycin peri-operatively  Microbiology results: none  Thank you for allowing pharmacy to be a part of this patient's care.  Lindell Spar, PharmD, BCPS Pager: [phone] 01/26/2016 2:00 PM

## 2016-01-26 NOTE — Interval H&P Note (Signed)
History and Physical Interval Note:  01/26/2016 8:26 AM  [name]  has presented today for surgery, with the diagnosis of LUMBER HNP  The various methods of treatment have been discussed with the patient and family. After consideration of risks, benefits and other options for treatment, the patient has consented to  Procedure(s): MICRO LUMBER L4-L5 (N/A) as a surgical intervention .  The patient's history has been reviewed, patient examined, no change in status, stable for surgery.  I have reviewed the patient's chart and labs.  Questions were answered to the patient's satisfaction.     [name] C

## 2016-01-26 NOTE — Addendum Note (Signed)
Addendum  created 01/26/16 1336 by Ofilia Neas, CRNA   Modules edited: Anesthesia Flowsheet

## 2016-01-26 NOTE — Anesthesia Postprocedure Evaluation (Signed)
Anesthesia Post Note  Patient: [name]  Procedure(s) Performed: Procedure(s) (LRB): MICRO LUMBER L4-L5 (N/A)  Patient location during evaluation: PACU Anesthesia Type: General Level of consciousness: awake and alert Pain management: pain level controlled Vital Signs Assessment: post-procedure vital signs reviewed and stable Respiratory status: spontaneous breathing, nonlabored ventilation, respiratory function stable and patient connected to nasal cannula oxygen Cardiovascular status: blood pressure returned to baseline and stable Postop Assessment: no signs of nausea or vomiting Anesthetic complications: no    Last Vitals:  Filed Vitals:   01/26/16 1100 01/26/16 1115  BP: 114/78   Pulse: 108 103  Temp: 37.1 C   Resp: 21 10    Last Pain:  Filed Vitals:   01/26/16 1115  PainSc: 4                  Christiane Sistare L

## 2016-01-27 DIAGNOSIS — M5116 Intervertebral disc disorders with radiculopathy, lumbar region: Secondary | ICD-10-CM | POA: Diagnosis not present

## 2016-01-27 DIAGNOSIS — R269 Unspecified abnormalities of gait and mobility: Secondary | ICD-10-CM | POA: Diagnosis not present

## 2016-01-27 DIAGNOSIS — G4733 Obstructive sleep apnea (adult) (pediatric): Secondary | ICD-10-CM | POA: Diagnosis not present

## 2016-01-27 LAB — BASIC METABOLIC PANEL
Anion gap: 5 (ref 5–15)
BUN: 9 mg/dL (ref 6–20)
CALCIUM: 8.4 mg/dL — AB (ref 8.9–10.3)
CO2: 30 mmol/L (ref 22–32)
CREATININE: 0.73 mg/dL (ref 0.44–1.00)
Chloride: 98 mmol/L — ABNORMAL LOW (ref 101–111)
GFR calc Af Amer: >60 mL/min (ref >60)
Glucose, Bld: 122 mg/dL — ABNORMAL HIGH (ref 65–99)
Potassium: 4.3 mmol/L (ref 3.5–5.1)
SODIUM: 133 mmol/L — AB (ref 135–145)

## 2016-01-27 LAB — GLUCOSE, CAPILLARY
GLUCOSE-CAPILLARY: 125 mg/dL — AB (ref 65–99)
GLUCOSE-CAPILLARY: 147 mg/dL — AB (ref 65–99)
Glucose-Capillary: 129 mg/dL — ABNORMAL HIGH (ref 65–99)
Glucose-Capillary: 103 mg/dL — ABNORMAL HIGH (ref 65–99)

## 2016-01-27 MED ORDER — DIAZEPAM 5 MG/ML IJ SOLN
5.0000 mg | Freq: Four times a day (QID) | INTRAMUSCULAR | Status: DC | PRN
  Administered 2016-01-27 (×2): 5 mg via INTRAVENOUS
  Filled 2016-01-27: qty 2

## 2016-01-27 MED ORDER — HYDROMORPHONE HCL 2 MG PO TABS
2.0000 mg | ORAL_TABLET | ORAL | Status: DC | PRN
  Administered 2016-01-27 (×2): 4 mg via ORAL
  Administered 2016-01-27: 2 mg via ORAL
  Administered 2016-01-28 (×2): 4 mg via ORAL
  Filled 2016-01-27 (×5): qty 2

## 2016-01-27 MED ORDER — PROSIGHT PO TABS
1.0000 | ORAL_TABLET | Freq: Every day | ORAL | Status: DC
  Administered 2016-01-27 – 2016-01-28 (×2): 1 via ORAL
  Filled 2016-01-27 (×2): qty 1

## 2016-01-27 MED ORDER — DIAZEPAM 5 MG/ML IJ SOLN
INTRAMUSCULAR | Status: AC
  Filled 2016-01-27: qty 2

## 2016-01-27 MED ORDER — OXYCODONE HCL 5 MG PO TABS
10.0000 mg | ORAL_TABLET | ORAL | Status: DC | PRN
  Administered 2016-01-27 – 2016-01-28 (×3): 10 mg via ORAL
  Filled 2016-01-27 (×3): qty 2

## 2016-01-27 MED ORDER — ACETAMINOPHEN 10 MG/ML IV SOLN
1000.0000 mg | Freq: Four times a day (QID) | INTRAVENOUS | Status: AC
  Administered 2016-01-27: 1000 mg via INTRAVENOUS
  Filled 2016-01-27: qty 100

## 2016-01-27 NOTE — Care Management Note (Addendum)
Case Management Note  Patient Details  Name: [name] MRN: [id number] Date of Birth: 04/03/1966  Subjective/Objective:                  MICRO LUMBER L4-L5 (N/A) Action/Plan: Cm met with pt to discuss discharge needs.  NO HH services recc or ordered. Pt has a rolling walker at home and requests a 3n1 for home.  CM called [facility] DME rep, Germaine to please deliver the 3n1 to room prior to discharge.  No other CM needs were communicated. Expected Discharge Date:                  Expected Discharge Plan:  Home/Self Care  In-House Referral:     Discharge planning Services     Post Acute Care Choice:    Choice offered to:     DME Arranged:  3-N-1 DME Agency:  [place]:  NA [place] Agency:  NA  Status of Service:  Completed, signed off  If discussed at [place] of Stay Meetings, dates discussed:    Additional Comments:  Dellie Catholic, RN 01/27/2016, 1:35 PM

## 2016-01-27 NOTE — Progress Notes (Signed)
Subjective: 1 Day Post-Op Procedure(s) (LRB): MICRO LUMBER L4-L5 (N/A) Patient reports pain as 5 on 0-10 scale.   Some pain into buttock though better Objective: Vital signs in last 24 hours: Temp:  [97.6 F (36.4 C)-98.7 F (37.1 C)] 98.4 F (36.9 C) (07/13 0619) Pulse Rate:  [82-113] 83 (07/13 0619) Resp:  [10-24] 16 (07/13 0619) BP: (100-143)/(58-96) 102/58 mmHg (07/13 0619) SpO2:  [92 %-100 %] 99 % (07/13 0619) Weight:  [133.811 kg (295 lb)] 133.811 kg (295 lb) (07/12 1131)  Intake/Output from previous day: 07/12 0701 - 07/13 0700 In: 2280 [P.O.:480; I.V.:1500; IV Piggyback:300] Out: 4175 [Urine:4150; Blood:25] Intake/Output this shift:    No results for input(s): HGB in the last 72 hours. No results for input(s): WBC, RBC, HCT, PLT in the last 72 hours.  Recent Labs  01/27/16 0415  NA 133*  K 4.3  CL 98*  CO2 30  BUN 9  CREATININE 0.73  GLUCOSE 122*  CALCIUM 8.4*   No results for input(s): LABPT, INR in the last 72 hours.  Neurologically intact Neurovascular intact Sensation intact distally Incision: dressing C/D/I  Assessment/Plan: 1 Day Post-Op Procedure(s) (LRB): MICRO LUMBER L4-L5 (N/A) Advance diet Up with therapy D/C IV fluids Discharge home with home health probable if ok in PT D/c instr given  [name] C 01/27/2016, 7:13 AM

## 2016-01-27 NOTE — Progress Notes (Signed)
Pt post op back surgery L4-L5 on 01/26/16.  Pain management of concern. PRN break through pain meds not enough for comfort of patient. Call out to covering Medical professional. Provider Doren Custard with call back and orders to given Valium 5mg  IV x3 doses q6 hours prn if needed, and also Tylenol gtt for dose x1. Call out to [facility] to clarify dosages and to send bottled tylenol dose.  With additional rotation of meds pt seems to be consolable at this time. Pt refuses to ambulate to bathroom, was able to sit at bedside with NT Megan, but not willing to ambulate. Foley discontinued this morning, pt DTV, unwilling to ambulate at this time.  Will f/u with day shift RN and NT.

## 2016-01-27 NOTE — Progress Notes (Signed)
Occupational Therapy Evaluation Patient Details Name: [name] MRN: [id number] DOB: Nov 19, 1965 Today's Date: 01/27/2016    History of Present Illness s/p microlumbar decompression at L4-5   Clinical Impression   Patient presents to OT s/p above procedure with the below functional deficits. She will benefit from skilled OT to maximize ADL independence and safety to allow discharge to the venue listed below. OT will follow.    Follow Up Recommendations  No OT follow up;Supervision/Assistance - 24 hour    Equipment Recommendations  3 in 1 bedside comode    Recommendations for Other Services       Precautions / Restrictions Precautions Precautions: Back Precaution Booklet Issued: Yes (comment) Precaution Comments: issued handout on back precautions and reviewed with patient Restrictions Weight Bearing Restrictions: No      Mobility Bed Mobility Overal bed mobility: Needs Assistance Bed Mobility: Rolling;Sidelying to Sit Rolling: Min assist Sidelying to sit: Min assist       General bed mobility comments: cues for log roll technique  Transfers Overall transfer level: Needs assistance Equipment used: Rolling walker (2 wheeled) Transfers: Sit to/from Stand Sit to Stand: Min guard;From elevated surface              Balance                                            ADL Overall ADL's : Needs assistance/impaired Eating/Feeding: Independent;Sitting   Grooming: Wash/dry hands;Min guard;Standing           Upper Body Dressing : Moderate assistance;Sitting;Standing   Lower Body Dressing: Total assistance;Bed level Lower Body Dressing Details (indicate cue type and reason): don socks Toilet Transfer: Min guard;Ambulation;BSC;RW   Toileting- Clothing Manipulation and Hygiene: Total assistance;Sit to/from stand Toileting - Clothing Manipulation Details (indicate cue type and reason): unable to perform toilet hygiene due to body  habitus and back precautions     Functional mobility during ADLs: Min guard;Minimal assistance;Rolling walker General ADL Comments: Patient educated on back precautions and ADL implications. Patient reports pain control has been an issue but agreeable to work with therapy. Patient practiced bed mobility, transfers, ambulation to/from bathroom, toilet transfer/toileting, and ambulation with RW in hallway. She had difficulty with toilet hygiene but reports she has a toilet aide at home but does not know how to use it. OT will address that and AE for LB self-care next visit, as well as practicing shower transfer.     Vision     Perception     Praxis      Pertinent Vitals/Pain Pain Assessment: 0-10 Pain Score: 7  Pain Location: back and LEs Pain Descriptors / Indicators: Aching;Sore Pain Intervention(s): Monitored during session;Premedicated before session;Repositioned     Hand Dominance Right   Extremity/Trunk Assessment Upper Extremity Assessment Upper Extremity Assessment: Overall WFL for tasks assessed   Lower Extremity Assessment Lower Extremity Assessment: Defer to PT evaluation   Cervical / Trunk Assessment Cervical / Trunk Assessment: Normal   Communication Communication Communication: No difficulties   Cognition Arousal/Alertness: Awake/alert Behavior During Therapy: WFL for tasks assessed/performed Overall Cognitive Status: Within Functional Limits for tasks assessed                     General Comments       Exercises       Shoulder Instructions      Home Living Family/patient  expects to be discharged to:: Private residence Living Arrangements: Spouse/significant other (also mother in law lives in home) Available Help at Discharge: Family;Available 24 hours/day Type of Home: House Home Access: Stairs to enter [employer] of Steps: 1 Entrance Stairs-Rails: None Home Layout: Two level;Able to live on main level with bedroom/bathroom      Bathroom Shower/Tub: Occupational psychologist: Handicapped height     Home Equipment: [place] - single point;Walker - 2 wheels;Walker - standard;Shower seat - built in;Grab bars - tub/shower;Adaptive equipment Adaptive Equipment: Reacher;Other (Comment) (toilet aide)        Prior Functioning/Environment Level of Independence: Independent        Comments: helps care for her mother in law with dementia    OT Diagnosis: Acute pain   OT Problem List: Decreased strength;Decreased activity tolerance;Decreased knowledge of use of DME or AE;Decreased knowledge of precautions;Pain;Obesity   OT Treatment/Interventions: Self-care/ADL training;DME and/or AE instruction;Therapeutic activities;Patient/family education    OT Goals(Current goals can be found in the care plan section) Acute Rehab OT Goals Patient Stated Goal: home once pain is under control OT Goal Formulation: With patient Time For Goal Achievement: 02/10/16 Potential to Achieve Goals: Good ADL Goals Pt Will Perform Lower Body Bathing: with min assist;with adaptive equipment;sit to/from stand Pt Will Perform Lower Body Dressing: with min assist;with adaptive equipment;sit to/from stand Pt Will Transfer to Toilet: with modified independence;ambulating;bedside commode Pt Will Perform Toileting - Clothing Manipulation and hygiene: with min assist;with caregiver independent in assisting;with adaptive equipment;sit to/from stand Pt Will Perform Tub/Shower Transfer: Shower transfer;with supervision;ambulating;shower seat;grab bars;rolling walker  OT Frequency: Min 2X/week   Barriers to D/C:            Co-evaluation PT/OT/SLP Co-Evaluation/Treatment: Yes Reason for Co-Treatment: For patient/therapist safety PT goals addressed during session: Mobility/safety with mobility OT goals addressed during session: ADL's and self-care      End of Session Equipment Utilized During Treatment: Rolling walker Nurse  Communication: Mobility status;Patient requests pain meds  Activity Tolerance: Patient tolerated treatment well Patient left: in chair;with call bell/phone within reach;with chair alarm set;with nursing/sitter in room   Time: 0816-0900 OT Time Calculation (min): 44 min Charges:  OT General Charges $OT Visit: 1 Procedure OT Evaluation $OT Eval Low Complexity: 1 Procedure OT Treatments $Self Care/Home Management : 8-22 mins G-Codes: OT G-codes **NOT FOR INPATIENT CLASS** Functional Assessment Tool Used: clinical judgment Functional Limitation: Self care Self Care Current Status [id number]): At least 40 percent but less than 60 percent impaired, limited or restricted Self Care Goal Status [id number]): At least 20 percent but less than 40 percent impaired, limited or restricted  [name] A 01/27/2016, 9:13 AM

## 2016-01-27 NOTE — Evaluation (Signed)
Physical Therapy Evaluation Patient Details Name: [name] MRN: [id number] DOB: 1966-05-14 Today's Date: 01/27/2016   History of Present Illness  s/p microlumbar decompression at L4-5  Clinical Impression  THE PATIENT TOLERATED AMBULATION VERY WELL. PLANS DC [place]. PATIENT IS VERY MOTIVATED TO AMBULATE. Pt admitted with above diagnosis. Pt currently with functional limitations due to the deficits listed below (see PT Problem List). Pt will benefit from skilled PT to increase their independence and safety with mobility to allow discharge to the venue listed below.        No PT follow up    Equipment Recommendations  None recommended by PT    Recommendations for Other Services       Precautions / Restrictions Precautions Precautions: Back Precaution Booklet Issued: Yes (comment) Precaution Comments: issued handout on back precautions and reviewed with patient Restrictions Weight Bearing Restrictions: No      Mobility  Bed Mobility Overal bed mobility: Needs Assistance Bed Mobility: Rolling;Sidelying to Sit Rolling: Min assist Sidelying to sit: Min assist       General bed mobility comments: cues for log roll technique  Transfers Overall transfer level: Needs assistance Equipment used: Rolling walker (2 wheeled) Transfers: Sit to/from Stand Sit to Stand: Min guard;From elevated surface            Ambulation/Gait Ambulation/Gait assistance: Min guard Ambulation Distance (Feet): 175 Feet Assistive device: Rolling walker (2 wheeled) Gait Pattern/deviations: Step-to pattern;Step-through pattern     General Gait Details: slow speed  Stairs            Wheelchair Mobility    Modified Rankin (Stroke Patients Only)       Balance                                             Pertinent Vitals/Pain Pain Assessment: 0-10 Pain Score: 7  Pain Location: back and legs Pain Descriptors / Indicators: Aching Pain Intervention(s):  Monitored during session;Premedicated before session    Home Living Family/patient expects to be discharged to:: Private residence Living Arrangements: Spouse/significant other (also mother in law lives in home) Available Help at Discharge: Family;Available 24 hours/day Type of Home: House Home Access: Stairs to enter Entrance Stairs-Rails: None Entrance Stairs-Number of Steps: 1 Home Layout: Two level;Able to live on main level with bedroom/bathroom Home Equipment: Kasandra Knudsen - single point;Walker - 2 wheels;Walker - standard;Shower seat - built in;Grab bars - tub/shower;Adaptive equipment      Prior Function Level of Independence: Independent         Comments: helps care for her mother in law with dementia     Hand Dominance   Dominant Hand: Right    Extremity/Trunk Assessment   Upper Extremity Assessment: Defer to OT evaluation           Lower Extremity Assessment: Overall WFL for tasks assessed      Cervical / Trunk Assessment: Normal  Communication   Communication: No difficulties  Cognition Arousal/Alertness: Awake/alert Behavior During Therapy: WFL for tasks assessed/performed Overall Cognitive Status: Within Functional Limits for tasks assessed                      General Comments      Exercises        Assessment/Plan    PT Assessment Patient needs continued PT services  PT Diagnosis Difficulty walking;Acute pain  PT Problem List Decreased strength;Decreased activity tolerance;Decreased mobility;Pain;Decreased knowledge of precautions  PT Treatment Interventions DME instruction;Gait training;Functional mobility training;Therapeutic activities;Therapeutic exercise;Patient/family education   PT Goals (Current goals can be found in the Care Plan section) Acute Rehab PT Goals Patient Stated Goal: home once pain is under control PT Goal Formulation: With patient Time For Goal Achievement: 01/29/16 Potential to Achieve Goals: Good     Frequency Min 5X/week   Barriers to discharge        Co-evaluation PT/OT/SLP Co-Evaluation/Treatment: Yes Reason for Co-Treatment: For patient/therapist safety PT goals addressed during session: Mobility/safety with mobility OT goals addressed during session: ADL's and self-care       End of Session   Activity Tolerance: Patient tolerated treatment well Patient left: in chair;with call bell/phone within reach;with chair alarm set Nurse Communication: Mobility status    Functional Assessment Tool Used: clinical judgement Functional Limitation: Mobility: Walking and moving around Mobility: Walking and Moving Around Current Status [id number]): At least 20 percent but less than 40 percent impaired, limited or restricted Mobility: Walking and Moving Around Goal Status [phone]): At least 1 percent but less than 20 percent impaired, limited or restricted    Time: 0815-0900 PT Time Calculation (min) (ACUTE ONLY): 45 min   Charges:   PT Evaluation $PT Eval Low Complexity: 1 Procedure     PT G Codes:   PT G-Codes **NOT FOR INPATIENT CLASS** Functional Assessment Tool Used: clinical judgement Functional Limitation: Mobility: Walking and moving around Mobility: Walking and Moving Around Current Status [id number]): At least 20 percent but less than 40 percent impaired, limited or restricted Mobility: Walking and Moving Around Goal Status [phone]): At least 1 percent but less than 20 percent impaired, limited or restricted    Claretha Cooper 01/27/2016, 10:02 AM Tresa Endo PT [phone]

## 2016-01-27 NOTE — Op Note (Signed)
[name], [name]              ACCOUNT NO.:  [id number]  MEDICAL RECORD NO.:  [id number]  LOCATION:  [id number]                         FACILITY:  [facility]  PHYSICIAN:  Susa Day, M.D.    DATE OF BIRTH:  [birth year]  DATE OF PROCEDURE:  01/26/2016 DATE OF DISCHARGE:                              OPERATIVE REPORT   PREOPERATIVE DIAGNOSES:  Spinal stenosis, herniated nucleus pulposus, L4- 5.  Elevated BMI with BMI of 51.  POSTOPERATIVE DIAGNOSES:  Spinal stenosis, herniated nucleus pulposus, L4-5.  Elevated BMI with BMI of 51.  PROCEDURES PERFORMED: 1. Bilateral microlumbar decompression at L4-5, with bilateral     foraminotomies at L4-L5. 2. Microdiskectomy, L4-5, left. 3. Technical difficulty increased due to the patient's elevated BMI.  ANESTHESIA:  General.  ASSISTANT:  Cleophas Dunker, PA.  PATHOLOGY:  Specimen at L4-5 disk.  HISTORY:  A 49, bilateral lower extremity radicular pain, left greater than right, due to HNP and spinal stenosis and disk degeneration at 5. She had chronic back pain, multilevel disk degeneration.  We discussed options in terms of surgical intervention, demonstrating severe pain into the leg.  We discussed decompression at 4-5 and having bilateral symptoms due to end-stage disk degeneration, narrowing of the spinal canal and her elevated BMI.  We discussed decompression.  Risk and benefits were discussed including bleeding, infection, damage to the neurovascular structures, no change in symptoms, worsening symptoms, DVT, PE, anesthetic complications, persistent back pain, possibility of fusion in the future.  This surgery would not address her back pain. She has had history of fibromyalgia.  I counseled her on the increased risk of persistent pain related to her fibromyalgia.  TECHNIQUE:  With the patient in supine position, after induction of adequate general anesthesia, 1.5 of vancomycin, she was placed prone on the [place] frame.  Gently, all  bony prominences were well padded, Foley to gravity.  Lumbar region was prepped and draped in usual sterile fashion.  Two 18-gauge spinal needles were utilized to localize the 4-5 interspace, confirmed with x-ray.  Incision was made from the spinous process of L4-5.  Subcutaneous tissue was dissected.  There was an ample subcutaneous adipose layer.  Electrocautery was utilized to achieve hemostasis.  Dorsolumbar fascia identified.  We infiltrated with 0.25% Marcaine with epinephrine.  We divided in line with skin incision, elevated paraspinous musculature bilaterally and had to place extra long retractors bilaterally.  Confirmatory radiograph obtained.  She had a very small interlaminar window bilaterally at 4-5 in an effort to not sacrifice the facet and that she was stable at 4-5.  I elected to remove the interspinous ligament.  Bilateral hemilaminotomies were performed with caudad edge of 4.  We used a straight curette to detach the ligamentum flavum from the cephalad edge of 5.  We placed a neuro patty beneath the ligamentum flavum.  We detached the ligamentum flavum from the inferior edge of 4.  I then performed with neuroprotection at all times, foraminotomies first at 5 on the left and then on the right.  She had severe compression of the 5 root particularly on the left, also on the right.  We decompressed the lateral recess to the medial border of the pedicle.  Bipolar electrocautery was utilized to achieve hemostasis. A focal HNP was noted, paracentral left at 5.  Neural elements were well protected, though I performed an annulotomy.  Copious portion of the disk material was removed from the disk space with micropituitary. Further mobilized with an Epstein nerve hook and then catheter irrigation of the disk space.  Following catheter irrigation, we had additional fragments that were retrieved.  Foraminotomies of 4 were performed bilaterally as well.  We checked the contralateral  side, there was no disk herniation, residual on the right.  Large fragment was found that was removed from the midline off to the left.  This was sent to Pathology.  Confirmatory radiograph obtained at 4-5.  Bone wax placed on the cancellous surfaces.  The neuroprobe passed freely at the foramen of 4 and 5.  There was 1 cm of excursion at the 5 root medial to pedicle without tension.  Copiously irrigated the wound.  No evidence of CSF leakage or active bleeding.  I therefore removed the [facility] retractor.  Paraspinous muscle was inspected, no evidence of active bleeding.  Dorsolumbar fascia was reapproximated with 1 Vicryl, subcu with 2-0.  Also, we had added bilateral hemilaminotomies at the cephalad edge of 5 for central spinal stenosis, which was noted.  Subcu with multiple 2-0s and skin with staples due to her elevated BMI.  Sterile dressing was applied, she was placed supine on the hospital bed, extubated without difficulty, and transported to the recovery room in satisfactory condition.  The patient tolerated the procedure well.  No complications.  Assistant, Cleophas Dunker, PA was used throughout the case for patient positioning, also closure and gentle intermittent neural traction.  The technical difficulty increased due to the patient's elevated BMI.     Susa Day, M.D.     [name]  D:  01/26/2016  T:  01/27/2016  Job:  [id number]

## 2016-01-28 DIAGNOSIS — M5116 Intervertebral disc disorders with radiculopathy, lumbar region: Secondary | ICD-10-CM | POA: Diagnosis not present

## 2016-01-28 LAB — GLUCOSE, CAPILLARY: GLUCOSE-CAPILLARY: 112 mg/dL — AB (ref 65–99)

## 2016-01-28 MED ORDER — HYDROMORPHONE HCL 2 MG PO TABS: 2.0000 mg | ORAL_TABLET | ORAL | Status: DC | PRN

## 2016-01-28 NOTE — Progress Notes (Signed)
Physical Therapy Treatment Patient Details Name: [name] MRN: [id number] DOB: 11/25/1965 Today's Date: 2016-02-15    History of Present Illness Pt is a 50 year old female s/p microlumbar decompression at L4-5    PT Comments    Pt ambulated in hallway and practiced one step.  Spouse present and observed.  Reviewed back precautions verbally and during mobility.  Pt feels ready for d/c home today.  Follow Up Recommendations  No PT follow up     Equipment Recommendations  None recommended by PT    Recommendations for Other Services       Precautions / Restrictions Precautions Precautions: Back Precaution Booklet Issued: Yes (comment) Precaution Comments: reviewed precautions Restrictions Weight Bearing Restrictions: No    Mobility  Bed Mobility Overal bed mobility: Needs Assistance Bed Mobility: Rolling;Sidelying to Sit Rolling: Supervision Sidelying to sit: Min guard       General bed mobility comments: pt up in recliner  Transfers Overall transfer level: Needs assistance Equipment used: Rolling walker (2 wheeled) Transfers: Sit to/from Stand Sit to Stand: Supervision         General transfer comment: verbal cues for maintaining precautions  Ambulation/Gait Ambulation/Gait assistance: Supervision Ambulation Distance (Feet): 200 Feet Assistive device: Rolling walker (2 wheeled) Gait Pattern/deviations: Step-through pattern;Decreased stride length     General Gait Details: very slow pace, a couple short standing rest breaks due to spasms   Stairs Stairs: Yes Stairs assistance: Min guard Stair Management: Step to pattern;Forwards;With walker Number of Stairs: 1 General stair comments: pt able to recall correct technique from previous knee surgeries, reviewed maintaining back precautions during step, spouse present  Wheelchair Mobility    Modified Rankin (Stroke Patients Only)       Balance                                     Cognition Arousal/Alertness: Awake/alert Behavior During Therapy: WFL for tasks assessed/performed Overall Cognitive Status: Within Functional Limits for tasks assessed                      Exercises      General Comments        Pertinent Vitals/Pain Pain Assessment: 0-10 Pain Score: 7  Pain Location: back Pain Descriptors / Indicators: Spasm Pain Intervention(s): Limited activity within patient's tolerance;Monitored during session;Premedicated before session    Home Living                      Prior Function            PT Goals (current goals can now be found in the care plan section) Acute Rehab PT Goals Patient Stated Goal: home today Progress towards PT goals: Progressing toward goals    Frequency  Min 5X/week    PT Plan Current plan remains appropriate    Co-evaluation             End of Session   Activity Tolerance: Patient tolerated treatment well Patient left: in chair;with call bell/phone within reach;with chair alarm set     Time: [id number] PT Time Calculation (min) (ACUTE ONLY): 23 min  Charges:  $Gait Training: 8-22 mins                    G Codes:      [name],[name] 15-Feb-2016, 10:13 AM [name], PT, DPT 02/15/2016 Pager: [phone]
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
## 2016-01-28 NOTE — Progress Notes (Signed)
Subjective: 2 Days Post-Op Procedure(s) (LRB): MICRO LUMBER L4-L5 (N/A) Patient reports pain as 5 on 0-10 scale.    Objective: Vital signs in last 24 hours: Temp:  [98.6 F (37 C)-100.4 F (38 C)] 98.6 F (37 C) (07/14 0510) Pulse Rate:  [86-110] 101 (07/14 0510) Resp:  [16-22] 20 (07/14 0510) BP: (90-105)/(53-70) 102/63 mmHg (07/14 0510) SpO2:  [93 %-99 %] 99 % (07/14 0510)  Intake/Output from previous day: 07/13 0701 - 07/14 0700 In: 2342.7 [P.O.:1920; I.V.:62.7] Out: 5000 [Urine:5000] Intake/Output this shift:    No results for input(s): HGB in the last 72 hours. No results for input(s): WBC, RBC, HCT, PLT in the last 72 hours.  Recent Labs  01/27/16 0415  NA 133*  K 4.3  CL 98*  CO2 30  BUN 9  CREATININE 0.73  GLUCOSE 122*  CALCIUM 8.4*   No results for input(s): LABPT, INR in the last 72 hours.  Neurologically intact ABD soft Sensation intact distally Dorsiflexion/Plantar flexion intact Incision: dressing C/D/I   Motor 5/5 ehl improved. Assessment/Plan: 2 Days Post-Op Procedure(s) (LRB): MICRO LUMBER L4-L5 (N/A) Advance diet Up with therapy D/C IV fluids Discharge home with home health  Better on dilaudid Pain into buttock but better. Improved EHL strength. D/C instruction given   [name] C 01/28/2016, 9:22 AM

## 2016-01-28 NOTE — Progress Notes (Signed)
Occupational Therapy Treatment Patient Details Name: EVIANNA CHANDRAN MRN: [id number] DOB: 1966-01-23 Today's Date: 01/28/2016    History of present illness s/p microlumbar decompression at L4-5   OT comments  All education completed and pt questions answered. No further OT needs at this time. Will sign off.  Follow Up Recommendations  No OT follow up;Supervision/Assistance - 24 hour    Equipment Recommendations  3 in 1 bedside comode    Recommendations for Other Services      Precautions / Restrictions Precautions Precautions: Back Precaution Booklet Issued: Yes (comment) Restrictions Weight Bearing Restrictions: No       Mobility Bed Mobility Overal bed mobility: Needs Assistance Bed Mobility: Rolling;Sidelying to Sit Rolling: Supervision Sidelying to sit: Min guard       General bed mobility comments: guard A to scoot EOB  Transfers Overall transfer level: Needs assistance Equipment used: Rolling walker (2 wheeled) Transfers: Sit to/from Stand Sit to Stand: Supervision              Balance                                   ADL Overall ADL's : Needs assistance/impaired                     Lower Body Dressing: Total assistance;Bed level Lower Body Dressing Details (indicate cue type and reason): don socks Toilet Transfer: Supervision/safety;Ambulation;BSC;RW   Toileting- Clothing Manipulation and Hygiene: Total assistance;Sit to/from stand Toileting - Clothing Manipulation Details (indicate cue type and reason): unable to perform toilet hygiene due to body habitus and back precautions Tub/ Shower Transfer: Walk-in shower;Supervision/safety;Ambulation;Shower seat;Rolling walker   Functional mobility during ADLs: Supervision/safety;Rolling walker General ADL Comments: Patient reported increased pain in hips/bottom radiating to legs and spasms. Willing to work with OT despite this. RN in with pain medication and muscle relaxer  during session. Educated patient on AE available for LB self-care and toileting. Patient has reacher and toilet aide. May benefit from long sponge and patient report she will obtain on her own. Practiced toilet transfer/toileting and walk-in shower transfer during session, then up to chair at end of session. All education completed.       Vision                     Perception     Praxis      Cognition   Behavior During Therapy: WFL for tasks assessed/performed Overall Cognitive Status: Within Functional Limits for tasks assessed                       Extremity/Trunk Assessment               Exercises     Shoulder Instructions       General Comments      Pertinent Vitals/ Pain       Pain Assessment: 0-10 Pain Score: 7  Pain Location: back and legs Pain Descriptors / Indicators: Spasm;Sharp Pain Intervention(s): RN gave pain meds during session;Repositioned;Monitored during session  Home Living                                          Prior Functioning/Environment              Frequency  Progress Toward Goals  OT Goals(current goals can now be found in the care plan section)  Progress towards OT goals: Goals met/education completed, patient discharged from OT  Acute Rehab OT Goals Patient Stated Goal: home today  Plan All goals met and education completed, patient discharged from OT services    Co-evaluation                 End of Session Equipment Utilized During Treatment: Rolling walker   Activity Tolerance Patient tolerated treatment well   Patient Left in chair;with call bell/phone within reach;with chair alarm set;with family/visitor present   Nurse Communication Mobility status    Functional Assessment Tool Used: clinical judgment Functional Limitation: Self care Self Care Current Status (R3202): At least 20 percent but less than 40 percent impaired, limited or restricted Self Care Goal  Status (B3435): At least 20 percent but less than 40 percent impaired, limited or restricted Self Care Discharge Status [phone]): At least 40 percent but less than 60 percent impaired, limited or restricted   Time: 0853-0921 OT Time Calculation (min): 28 min  Charges: OT G-codes **NOT FOR INPATIENT CLASS** Functional Assessment Tool Used: clinical judgment Functional Limitation: Self care Self Care Current Status (O3729): At least 20 percent but less than 40 percent impaired, limited or restricted Self Care Goal Status (M2111): At least 20 percent but less than 40 percent impaired, limited or restricted Self Care Discharge Status [phone]): At least 40 percent but less than 60 percent impaired, limited or restricted OT General Charges $OT Visit: 1 Procedure OT Treatments $Self Care/Home Management : 23-37 mins  Ashaunte Standley A 01/28/2016, 9:50 AM

## 2016-01-28 NOTE — Progress Notes (Signed)
RT placed patient on CPAP. Patient setting is Auto 4-10 cmH2O. Sterile water added to water chamber for humidification. Patient is tolerating well. RT will continue to monitor.

## 2016-01-28 NOTE — Progress Notes (Signed)
RN reviewed discharge instructions with patient and family. All questions answered.   Paperwork and prescriptions given.   NT rolled patient down with all belongings to family car. 
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
## 2016-02-03 ENCOUNTER — Ambulatory Visit (INDEPENDENT_AMBULATORY_CARE_PROVIDER_SITE_OTHER): Payer: PPO | Admitting: Psychiatry

## 2016-02-03 ENCOUNTER — Encounter (HOSPITAL_COMMUNITY): Payer: Self-pay | Admitting: Psychiatry

## 2016-02-03 VITALS — BP 138/84 | HR 104 | Ht 63.0 in | Wt 284.8 lb

## 2016-02-03 DIAGNOSIS — F411 Generalized anxiety disorder: Secondary | ICD-10-CM

## 2016-02-03 DIAGNOSIS — G4700 Insomnia, unspecified: Secondary | ICD-10-CM | POA: Diagnosis not present

## 2016-02-03 DIAGNOSIS — F332 Major depressive disorder, recurrent severe without psychotic features: Secondary | ICD-10-CM

## 2016-02-03 MED ORDER — TRAZODONE HCL 50 MG PO TABS: ORAL_TABLET | ORAL | Status: DC

## 2016-02-03 MED ORDER — PAROXETINE HCL 40 MG PO TABS: 40.0000 mg | ORAL_TABLET | Freq: Every day | ORAL | Status: DC
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
## 2016-02-07 ENCOUNTER — Ambulatory Visit (INDEPENDENT_AMBULATORY_CARE_PROVIDER_SITE_OTHER): Payer: PPO | Admitting: Clinical

## 2016-02-07 ENCOUNTER — Encounter (HOSPITAL_COMMUNITY): Payer: Self-pay | Admitting: Clinical

## 2016-02-07 DIAGNOSIS — F332 Major depressive disorder, recurrent severe without psychotic features: Secondary | ICD-10-CM

## 2016-02-07 DIAGNOSIS — F411 Generalized anxiety disorder: Secondary | ICD-10-CM | POA: Diagnosis not present

## 2016-02-07 DIAGNOSIS — F4321 Adjustment disorder with depressed mood: Secondary | ICD-10-CM

## 2016-02-07 NOTE — Progress Notes (Addendum)
   THERAPIST PROGRESS NOTE  Session Time: 1:32 - 2:28  Participation Level: Active  Behavioral Response: CasualAlertDepressed  Type of Therapy: Individual Therapy  Treatment Goals addressed: Improve Psychiatric Symptoms, Elevate Mood , Improve Unhelpful Thought Patterns,  decrease anxiety (reduce restlessness,  reduce angry outburst)  Interventions: Motivational Interviewing, CBT,   Summary: [name] is a 50 y.o. female who presents with Major Depressive Disorder, Recurrent Severe, without psychotic features, and Generalized Anxiety Disorder, and Unresolved Grief  Suicidal/Homicidal: No -without intent/plan  Therapist Response: [name] met with clinician for an individual session. [name] discussed her psychiatric symptoms and her current life events. [name] shared that she has been depressed but has not been as anxious. She shared that she had surgery on her back due to ruptured disk. She shared that her husband has been home taking care of her and her mother in law (she usually takes care of her mother in law). Her husband returns back to work this week. [name] shared that she completed her homework packet. [name] and clinician discussed her homework and her thoughts and insights from the homework. She shared that she has a low tolerance for stress which is what either triggers anxiety. [name] and clinician discussed some events that triggered her. [name] and clinician discussed some of her negative automatic thoughts and emotions. Clinician asked open ended questions and [name] identified the evidence for and against her negative automatic thoughts. She was then able to formulate healthier alternative thoughts. [name] shared that she would be more relaxed and happier if she invested in the healthier alternative thoughts.  Plan: Return again in 1-2  weeks  Diagnosis:     Axis I: Major Depressive Disorder, Recurrent Severe, without psychotic features, and Generalized Anxiety Disorder, and  Unresolved Grief   Steffi Noviello A, LCSW 02/07/2016

## 2016-02-21 ENCOUNTER — Ambulatory Visit (INDEPENDENT_AMBULATORY_CARE_PROVIDER_SITE_OTHER): Payer: PPO | Admitting: Clinical

## 2016-02-21 ENCOUNTER — Encounter (HOSPITAL_COMMUNITY): Payer: Self-pay | Admitting: Clinical

## 2016-02-21 DIAGNOSIS — F332 Major depressive disorder, recurrent severe without psychotic features: Secondary | ICD-10-CM | POA: Diagnosis not present

## 2016-02-21 DIAGNOSIS — F4321 Adjustment disorder with depressed mood: Secondary | ICD-10-CM | POA: Diagnosis not present

## 2016-02-21 DIAGNOSIS — F411 Generalized anxiety disorder: Secondary | ICD-10-CM

## 2016-02-21 NOTE — Progress Notes (Signed)
   THERAPIST PROGRESS NOTE  Session Time: 3:15 - 4:10  Participation Level: Active  Behavioral Response: CasualAlertNA  Type of Therapy: Individual Therapy  Treatment Goals addressed: Improve Psychiatric Symptoms, Elevate Mood  Improve Unhelpful Thought Patterns,  decrease anxiety (reduce restlessness, reduce angry outburst)  Interventions: Motivational Interviewing, CBT,  Summary: [name] is a 49 y.o. female who presents with Major Depressive Disorder, Recurrent Severe, without psychotic features, and Generalized Anxiety Disorder, and Unresolved Grief  Suicidal/Homicidal: No -without intent/plan  Therapist Response: Malaina met with clinician for an individual session. Mishika discussed her psychiatric symptoms and her current life events. Clorissa shared that she did not complete her homework. Shatiqua shared that she had been doing pretty good for the last two weeks. She shared that her back surgery went well and she was recovering well. She shared some of the stressors in her life have improved ( temp or perm?) She shared that her mother in law who had dementia [name] care takes her) has been more pleasant which has decreased Marvina's stress. Renaye and clinician discussed how Davyn could positively reinforce her mother in laws behavior ( help decrease her own stress and angry outburst.) . Client and clinician discussed other things Brighten could do to decrease her stress and elevate her mood.  Plan: Return again in 1 weeks  Diagnosis:     Axis I: Major Depressive Disorder, Recurrent Severe, without psychotic features, and Generalized Anxiety Disorder, and Unresolved Grief    Santhiago Collingsworth A, LCSW 02/21/2016

## 2016-03-13 ENCOUNTER — Ambulatory Visit (INDEPENDENT_AMBULATORY_CARE_PROVIDER_SITE_OTHER): Payer: PPO | Admitting: Clinical

## 2016-03-13 DIAGNOSIS — F332 Major depressive disorder, recurrent severe without psychotic features: Secondary | ICD-10-CM | POA: Diagnosis not present

## 2016-03-13 DIAGNOSIS — F411 Generalized anxiety disorder: Secondary | ICD-10-CM | POA: Diagnosis not present

## 2016-03-13 DIAGNOSIS — F4321 Adjustment disorder with depressed mood: Secondary | ICD-10-CM

## 2016-03-13 NOTE — Progress Notes (Signed)
   THERAPIST PROGRESS NOTE  Session Time: 2:30 - 3:25  Participation Level: Active  Behavioral Response: CasualAlertNA  Type of Therapy: Individual Therapy  Treatment Goals addressed: Improve Psychiatric Symptoms, Elevate Mood (being more active, getting out more), Improve Unhelpful Thought Patterns, , decrease anxiety (reduce restlessness, reduce trembling, reduce angry outburst)  Interventions: Motivational Interviewing, CBT,   Summary: Janet Humphreys is a 50 y.o. female who presents with Major Depressive Disorder, Recurrent Severe, without psychotic features, and Generalized Anxiety Disorder, and Unresolved Grief  Suicidal/Homicidal: No -without intent/plan  Therapist Response: Wealthy met with clinician for an individual session. Atha discussed her psychiatric symptoms and her current life events. Lajuana shared that she was feeling a little better this week she shared that it is likely due to being able to get away with her husband for a weekend. She shared her thoughts and insight about her life stressors especially taking care of her mother-in-law. She shared that the weekend made her realize that she needed more time for herself and for her and her husband's relationship. Clinician asked open ended questions and Catarina identified opportunities to get out more and to be more active. Clinician asked open ended questions and Aayra are identified some thought patterns that would need to change in order for her to get out more. Tyjai and clinician discussed some of the negative thoughts that got in the way of her being more active. Client and clinician discussed the evidence for these thoughts. Client and clinician discussed the evidence against the thoughts. Threasa Beards was then able to formulate healthier alternative thoughts that would help support her in being more active and more mindful use of her time when out. Client and clinician discussed how these thoughts changes would help improve her  anxiety and help her reduce her angry outbursts   Plan: Return again in 1 -2 weeks  Diagnosis:     Axis I: Major Depressive Disorder, Recurrent Severe, without psychotic features, and Generalized Anxiety Disorder, and Unresolved Grief   Lynnie Koehler A, LCSW 03/13/2016

## 2016-03-15 DIAGNOSIS — H6692 Otitis media, unspecified, left ear: Secondary | ICD-10-CM | POA: Diagnosis not present

## 2016-03-21 ENCOUNTER — Encounter (HOSPITAL_COMMUNITY): Payer: Self-pay | Admitting: Clinical

## 2016-03-23 DIAGNOSIS — H5203 Hypermetropia, bilateral: Secondary | ICD-10-CM | POA: Diagnosis not present

## 2016-03-23 DIAGNOSIS — H40033 Anatomical narrow angle, bilateral: Secondary | ICD-10-CM | POA: Diagnosis not present

## 2016-03-23 DIAGNOSIS — H40013 Open angle with borderline findings, low risk, bilateral: Secondary | ICD-10-CM | POA: Diagnosis not present

## 2016-03-23 DIAGNOSIS — E119 Type 2 diabetes mellitus without complications: Secondary | ICD-10-CM | POA: Diagnosis not present

## 2016-03-23 DIAGNOSIS — H524 Presbyopia: Secondary | ICD-10-CM | POA: Diagnosis not present

## 2016-03-23 DIAGNOSIS — H52223 Regular astigmatism, bilateral: Secondary | ICD-10-CM | POA: Diagnosis not present

## 2016-03-23 DIAGNOSIS — H04123 Dry eye syndrome of bilateral lacrimal glands: Secondary | ICD-10-CM | POA: Diagnosis not present

## 2016-03-23 DIAGNOSIS — H3589 Other specified retinal disorders: Secondary | ICD-10-CM | POA: Diagnosis not present

## 2016-03-27 ENCOUNTER — Ambulatory Visit (HOSPITAL_COMMUNITY): Payer: Self-pay | Admitting: Clinical

## 2016-03-27 DIAGNOSIS — J209 Acute bronchitis, unspecified: Secondary | ICD-10-CM | POA: Diagnosis not present

## 2016-04-10 DIAGNOSIS — E119 Type 2 diabetes mellitus without complications: Secondary | ICD-10-CM | POA: Diagnosis not present

## 2016-04-10 DIAGNOSIS — H2513 Age-related nuclear cataract, bilateral: Secondary | ICD-10-CM | POA: Diagnosis not present

## 2016-04-10 DIAGNOSIS — H2512 Age-related nuclear cataract, left eye: Secondary | ICD-10-CM | POA: Diagnosis not present

## 2016-04-10 DIAGNOSIS — H40033 Anatomical narrow angle, bilateral: Secondary | ICD-10-CM | POA: Diagnosis not present

## 2016-04-13 ENCOUNTER — Ambulatory Visit (INDEPENDENT_AMBULATORY_CARE_PROVIDER_SITE_OTHER): Payer: PPO | Admitting: Clinical

## 2016-04-13 DIAGNOSIS — F332 Major depressive disorder, recurrent severe without psychotic features: Secondary | ICD-10-CM

## 2016-04-13 DIAGNOSIS — F4321 Adjustment disorder with depressed mood: Secondary | ICD-10-CM | POA: Diagnosis not present

## 2016-04-13 DIAGNOSIS — F411 Generalized anxiety disorder: Secondary | ICD-10-CM | POA: Diagnosis not present

## 2016-04-13 NOTE — Progress Notes (Signed)
   THERAPIST PROGRESS NOTE  Session Time: 11:00 - 11:56  Participation Level: Active  Behavioral Response: CasualAlertDepressed  Type of Therapy: Individual Therapy  Treatment Goals addressed: Improve Psychiatric Symptoms, Elevate Mood (being more active, getting out more), Improve Unhelpful Thought Patterns, Discuss and process grief, decrease anxiety (reduce restlessness, reduce trembling, reduce angry outburst)  Interventions: Motivational Interviewing, CBT, Grounding & Mindfulness Techniques  Summary: [name] is a 50 y.o. female who presents with Major Depressive Disorder, Recurrent Severe, without psychotic features, and Generalized Anxiety Disorder, and Unresolved Grief  Suicidal/Homicidal: No -without intent/plan  Therapist Response: [name] met with clinician for an individual session. [name] discussed her psychiatric symptoms and her current life events. [name] shared that that she was having a lot depression. She shared that she had a fallout with her niece after watching her grandchild. She shared that her niece and said a locked of hurtful and hateful things. Clinician asked open ended questions and [name] cried as shared that the things she said were hurtful because she was concerned as if they true. Clinician asked open ended questions [name] shared what was said. [name] and clinician discussed the evidence for and the evidence against the hurtful statements. [name] and clinician discussed healthier alternative thoughts. Clinician asked open ended questions and [name] was able to formulate alternative possibilities for why the things were said. [name] and clinician discussed which of the reasons they were said was most likely. [name] recognized that her emotional response would be different if she thought they were because they were true about her or if they were due to her niece his insecurity. [name] and clinician discussed the fact that the things said were found to be lacking  evidence. [name] and clinician discussed the fact that she didn't know why they were said, she could ask for if she was going to assume that she could assume the one that was least painful to her. [name] and clinician discussed how [name]'s actions might change based on her assumption of my things were said. [name] and clinician agreed to discuss this further at future sessions. [name] denied any suicidal or homicidal ideation     Plan: Return again in 1 weeks  Diagnosis:     Axis I: Major Depressive Disorder, Recurrent Severe, without psychotic features, and Generalized Anxiety Disorder, and Unresolved Grief    [name] A, LCSW 04/13/2016

## 2016-04-20 ENCOUNTER — Ambulatory Visit (HOSPITAL_COMMUNITY): Payer: Self-pay | Admitting: Clinical

## 2016-04-24 ENCOUNTER — Encounter (HOSPITAL_COMMUNITY): Payer: Self-pay | Admitting: Clinical

## 2016-04-24 DIAGNOSIS — M50322 Other cervical disc degeneration at C5-C6 level: Secondary | ICD-10-CM | POA: Diagnosis not present

## 2016-04-28 DIAGNOSIS — M50322 Other cervical disc degeneration at C5-C6 level: Secondary | ICD-10-CM | POA: Diagnosis not present

## 2016-05-01 ENCOUNTER — Ambulatory Visit (INDEPENDENT_AMBULATORY_CARE_PROVIDER_SITE_OTHER): Payer: PPO | Admitting: Clinical

## 2016-05-01 DIAGNOSIS — F411 Generalized anxiety disorder: Secondary | ICD-10-CM

## 2016-05-01 DIAGNOSIS — F332 Major depressive disorder, recurrent severe without psychotic features: Secondary | ICD-10-CM | POA: Diagnosis not present

## 2016-05-01 DIAGNOSIS — F4321 Adjustment disorder with depressed mood: Secondary | ICD-10-CM | POA: Diagnosis not present

## 2016-05-01 DIAGNOSIS — M50322 Other cervical disc degeneration at C5-C6 level: Secondary | ICD-10-CM | POA: Diagnosis not present

## 2016-05-01 NOTE — Progress Notes (Signed)
   THERAPIST PROGRESS NOTE  Session Time: 2:25- 3:26  Participation Level: Active  Behavioral Response: CasualAlertDepressed  Type of Therapy: Individual Therapy  Treatment Goals addressed: Improve Psychiatric Symptoms, Elevate Mood (being more active, getting out more), Improve Unhelpful Thought Patterns,decrease anxiety ( reduce angry outburst)  Interventions: Motivational Interviewing, CBT,   Summary: [name] is a 50 y.o. female who presents with Major Depressive Disorder, Recurrent Severe, without psychotic features, and Generalized Anxiety Disorder, and Unresolved Grief  Suicidal/Homicidal: No -without intent/plan  Therapist Response: [name] met with clinician for an individual session. [name] discussed her psychiatric symptoms and her current life events. [name] shared that she has continued to be depressed and upset. She shared that her negative thoughts have been focused on her nieces rejection. Clinician asked open ended questions. [name] identified the things that caused her to think negative thoughts about herself. [name] shared what she would like her niece to do to "fix" things. Clinician asked open ended questions and [name] recognized that some of the things she feels bad about existed before her conflict with her niece. Client and clinician used CBT to challenge some of the negative thoughts [name] had about herself. Clinician asked open ended questions and [name] identified the evidence for and against the thoughts. [name] and clinician discussed how her emotions would improve if she believed the healthier alternative thoughts. [name] was then able to formulate healthier alternative thoughts. [name] then shared that she should have some stress reduction as her mother in law has been approved for more days of adult day care. Client and clinician discussed ways she could use this time for self care and improve her symptoms  Plan: Return again in 1 weeks  Diagnosis:     Axis I:  Major Depressive Disorder, Recurrent Severe, without psychotic features, and Generalized Anxiety Disorder, and Unresolved Grief    [name] A, LCSW 05/01/2016

## 2016-05-02 DIAGNOSIS — M50322 Other cervical disc degeneration at C5-C6 level: Secondary | ICD-10-CM | POA: Diagnosis not present

## 2016-05-05 DIAGNOSIS — M50322 Other cervical disc degeneration at C5-C6 level: Secondary | ICD-10-CM | POA: Diagnosis not present

## 2016-05-08 DIAGNOSIS — M50322 Other cervical disc degeneration at C5-C6 level: Secondary | ICD-10-CM | POA: Diagnosis not present

## 2016-05-11 ENCOUNTER — Ambulatory Visit (INDEPENDENT_AMBULATORY_CARE_PROVIDER_SITE_OTHER): Payer: PPO | Admitting: Psychiatry

## 2016-05-11 ENCOUNTER — Encounter (HOSPITAL_COMMUNITY): Payer: Self-pay | Admitting: Psychiatry

## 2016-05-11 DIAGNOSIS — F411 Generalized anxiety disorder: Secondary | ICD-10-CM | POA: Diagnosis not present

## 2016-05-11 DIAGNOSIS — F99 Mental disorder, not otherwise specified: Secondary | ICD-10-CM | POA: Diagnosis not present

## 2016-05-11 DIAGNOSIS — F5105 Insomnia due to other mental disorder: Secondary | ICD-10-CM

## 2016-05-11 DIAGNOSIS — F332 Major depressive disorder, recurrent severe without psychotic features: Secondary | ICD-10-CM

## 2016-05-11 MED ORDER — TRAZODONE HCL 100 MG PO TABS: 100.0000 mg | ORAL_TABLET | Freq: Every evening | ORAL | 3 refills | Status: DC | PRN

## 2016-05-11 MED ORDER — PAROXETINE HCL 40 MG PO TABS: 60.0000 mg | ORAL_TABLET | Freq: Every day | ORAL | 3 refills | Status: DC
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
## 2016-05-12 ENCOUNTER — Other Ambulatory Visit (HOSPITAL_COMMUNITY): Payer: Self-pay

## 2016-05-12 DIAGNOSIS — F411 Generalized anxiety disorder: Secondary | ICD-10-CM

## 2016-05-12 DIAGNOSIS — F332 Major depressive disorder, recurrent severe without psychotic features: Secondary | ICD-10-CM

## 2016-05-12 MED ORDER — PAROXETINE HCL 40 MG PO TABS: 60.0000 mg | ORAL_TABLET | Freq: Every day | ORAL | 3 refills | Status: DC

## 2016-05-17 ENCOUNTER — Ambulatory Visit (INDEPENDENT_AMBULATORY_CARE_PROVIDER_SITE_OTHER): Payer: PPO | Admitting: Clinical

## 2016-05-17 DIAGNOSIS — F411 Generalized anxiety disorder: Secondary | ICD-10-CM

## 2016-05-17 DIAGNOSIS — F332 Major depressive disorder, recurrent severe without psychotic features: Secondary | ICD-10-CM

## 2016-05-17 DIAGNOSIS — F4321 Adjustment disorder with depressed mood: Secondary | ICD-10-CM

## 2016-05-17 NOTE — Progress Notes (Signed)
   THERAPIST PROGRESS NOTE  Session Time: 2:33- 3:30   Participation Level: Active  Behavioral Response: CasualAlertDepressed  Type of Therapy: Individual Therapy  Treatment Goals addressed: Improve Psychiatric Symptoms, Elevate Mood , Improve Unhelpful Thought Patterns, decrease anxiety   Interventions: Motivational Interviewing, CBT, Grounding   Summary: Berea Majkowski is a 50 y.o. female who presents with Major Depressive Disorder, Recurrent Severe, without psychotic features, and Generalized Anxiety Disorder, and Unresolved Grief  Suicidal/Homicidal: No -without intent/plan  Therapist Response: Cornie met with clinician for an individual session. Mishayla discussed her psychiatric symptoms and her current life events. Norena shared that she continues to feel depressed. She reported "I just don't care." Clinician asked if she was suicidal or homicidal. She denied either she shared that she was feeling unsupported. Clinician asked open ended questions and Raiven shared about some of the things that were causing her to feel this way. Breshay shared about some events that hurt her feeling. Clinician asked open ended questions and Eadie shared her interpretations of the events. Client and clinician discussed alternative interpretations. During this discussion Hadlei identified a core belief. "I am not enough." Client and clinician discussed the core belief. Clinician asked Adrian open ended questions and Vermelle identified how the core belief has affected her experiences and interpretations of events. Koreen identified where she thought the belief was formed.   Client and clinician discussed the process of challenging negative core beliefs. Addilyne shared that her birthday is on Saturday. Clinician asked her to define enough for homework. Client and clinician reviewed and practice grounding technique  Plan: Return again in 1 weeks  Diagnosis:     Axis I: Major Depressive Disorder, Recurrent Severe, without  psychotic features, and Generalized Anxiety Disorder, and Unresolved Grief    Valery Amedee A, LCSW 05/17/2016

## 2016-05-19 DIAGNOSIS — M50322 Other cervical disc degeneration at C5-C6 level: Secondary | ICD-10-CM | POA: Diagnosis not present

## 2016-05-23 ENCOUNTER — Encounter (HOSPITAL_COMMUNITY): Payer: Self-pay | Admitting: Clinical

## 2016-05-23 DIAGNOSIS — H40033 Anatomical narrow angle, bilateral: Secondary | ICD-10-CM | POA: Diagnosis not present

## 2016-05-23 DIAGNOSIS — H25013 Cortical age-related cataract, bilateral: Secondary | ICD-10-CM | POA: Diagnosis not present

## 2016-05-23 DIAGNOSIS — H524 Presbyopia: Secondary | ICD-10-CM | POA: Diagnosis not present

## 2016-05-23 DIAGNOSIS — H3589 Other specified retinal disorders: Secondary | ICD-10-CM | POA: Diagnosis not present

## 2016-05-23 DIAGNOSIS — H52223 Regular astigmatism, bilateral: Secondary | ICD-10-CM | POA: Diagnosis not present

## 2016-05-23 DIAGNOSIS — H2513 Age-related nuclear cataract, bilateral: Secondary | ICD-10-CM | POA: Diagnosis not present

## 2016-05-24 DIAGNOSIS — M50322 Other cervical disc degeneration at C5-C6 level: Secondary | ICD-10-CM | POA: Diagnosis not present

## 2016-05-26 DIAGNOSIS — M4802 Spinal stenosis, cervical region: Secondary | ICD-10-CM | POA: Diagnosis not present

## 2016-05-26 DIAGNOSIS — M542 Cervicalgia: Secondary | ICD-10-CM | POA: Diagnosis not present

## 2016-06-05 ENCOUNTER — Ambulatory Visit (INDEPENDENT_AMBULATORY_CARE_PROVIDER_SITE_OTHER): Payer: PPO | Admitting: Clinical

## 2016-06-05 DIAGNOSIS — F411 Generalized anxiety disorder: Secondary | ICD-10-CM | POA: Diagnosis not present

## 2016-06-05 DIAGNOSIS — F332 Major depressive disorder, recurrent severe without psychotic features: Secondary | ICD-10-CM

## 2016-06-05 DIAGNOSIS — F4321 Adjustment disorder with depressed mood: Secondary | ICD-10-CM | POA: Diagnosis not present

## 2016-06-05 NOTE — Progress Notes (Signed)
   THERAPIST PROGRESS NOTE  Session Time: 3:32 - 4:28  Participation Level: Active  Behavioral Response: CasualAlertDepressed  Type of Therapy: Individual Therapy  Treatment Goals addressed: Improve Psychiatric Symptoms, Elevate Mood (being more active, getting out more), Improve Unhelpful Thought Patterns, decrease anxiety (reduce restlessness, reduce trembling, reduce angry outburst)  Interventions: Motivational Interviewing, CBT, Grounding & Mindfulness Techniques  Summary: [name] is a 50  y.o. female who presents with Major Depressive Disorder, Recurrent Severe, without psychotic features, and Generalized Anxiety Disorder, and Unresolved Grief  Suicidal/Homicidal: No -without intent/plan  Therapist Response: Rubylee met with clinician for an individual session. [name] discussed her psychiatric symptoms and her current life events. [name] shared that she has been having a difficult time emotionally. She shared that she continues to be depressed. She stated that she was unable to complete her homework which was to define "enough" Client and clinician discussed the difficulty of know if one is or is not something if they do not have the definition of that thing. [name] shared that she believed that the belief about not being enough stems from the fact that she is unable to have children. Clinician asked open ended questions and [name] was able to find several exceptions to this including her own (adopted mother). Clinician asked open ended questions and [name] shared that she does not feel loved and cared for in her relationships. Client and clinician discussed how she might express this in a way that would get her the desired attention and help improve her relationships. Client and clinician reviewed grounding and mindfulness techniques. Client and clinician discussed engaging in activities she enjoys while her mother in law are at adult day care.     Plan: Return again in 1  weeks  Diagnosis:     Axis I: Major Depressive Disorder, Recurrent Severe, without psychotic features, and Generalized Anxiety Disorder, and Unresolved Grief        , A, LCSW 06/05/2016  

## 2016-06-12 ENCOUNTER — Encounter (HOSPITAL_COMMUNITY): Payer: Self-pay | Admitting: Clinical

## 2016-06-12 DIAGNOSIS — F3340 Major depressive disorder, recurrent, in remission, unspecified: Secondary | ICD-10-CM | POA: Diagnosis not present

## 2016-06-12 DIAGNOSIS — E039 Hypothyroidism, unspecified: Secondary | ICD-10-CM | POA: Diagnosis not present

## 2016-06-12 DIAGNOSIS — M1990 Unspecified osteoarthritis, unspecified site: Secondary | ICD-10-CM | POA: Diagnosis not present

## 2016-06-12 DIAGNOSIS — R609 Edema, unspecified: Secondary | ICD-10-CM | POA: Diagnosis not present

## 2016-06-12 DIAGNOSIS — N3281 Overactive bladder: Secondary | ICD-10-CM | POA: Diagnosis not present

## 2016-06-12 DIAGNOSIS — M62830 Muscle spasm of back: Secondary | ICD-10-CM | POA: Diagnosis not present

## 2016-06-12 DIAGNOSIS — G4700 Insomnia, unspecified: Secondary | ICD-10-CM | POA: Diagnosis not present

## 2016-06-12 DIAGNOSIS — I10 Essential (primary) hypertension: Secondary | ICD-10-CM | POA: Diagnosis not present

## 2016-06-12 DIAGNOSIS — E1165 Type 2 diabetes mellitus with hyperglycemia: Secondary | ICD-10-CM | POA: Diagnosis not present

## 2016-06-12 DIAGNOSIS — E7800 Pure hypercholesterolemia, unspecified: Secondary | ICD-10-CM | POA: Diagnosis not present

## 2016-06-19 ENCOUNTER — Ambulatory Visit (INDEPENDENT_AMBULATORY_CARE_PROVIDER_SITE_OTHER): Payer: PPO | Admitting: Clinical

## 2016-06-19 DIAGNOSIS — F411 Generalized anxiety disorder: Secondary | ICD-10-CM

## 2016-06-19 DIAGNOSIS — F4321 Adjustment disorder with depressed mood: Secondary | ICD-10-CM | POA: Diagnosis not present

## 2016-06-19 DIAGNOSIS — F331 Major depressive disorder, recurrent, moderate: Secondary | ICD-10-CM

## 2016-06-19 NOTE — Progress Notes (Signed)
   THERAPIST PROGRESS NOTE  Session Time: 2:24 - 3:33  Participation Level: Active  Behavioral Response: CasualAlertDepressed  Type of Therapy: Individual Therapy  Treatment Goals addressed: Improve Psychiatric Symptoms, Elevate Mood, Improve Unhelpful Thought Patterns,  decrease anxiety   Interventions: Motivational Interviewing, CBT,   Summary: [name] is a 50  y.o. female who presents with Major Depressive Disorder, Recurrent Severe, without psychotic features, and Generalized Anxiety Disorder, and Unresolved Grief  Suicidal/Homicidal: No -without intent/plan  Therapist Response: [name] met with [name] for an individual session. [name] discussed her psychiatric symptoms and her current life events. [name] shared that she has had some good days but overall she is feeling depressed. She shared that her husband is more supportive and her mother in law is attending adult day care 4 x a week. This has reduced her stress somewhat. [name] shared that she continues to have some negative recurrent thoughts. [name] asked open ended questions and [name] shared her negative beliefs. [name] and [name] discussed the evidence for and against the belief. [name] and [name] asked if the beliefs were , realist, or true. [name] shared that they were not realistic or true. [name] asked open ended questions and [name] had the insight that her expectation was not realistic. [name]  and [name] discussed her options. [name] asked open ended questions and [name] was able to formulate healthier alternative beliefs. [name] shared that she has it with her head but not her heart yet. [name] and [name] agreed to discuss this further at future sessions   Plan: Return again in 1 weeks  Diagnosis:     Axis I: Major Depressive Disorder, Recurrent Severe, without psychotic features, and Generalized Anxiety Disorder, and Unresolved Grief   Calleigh Lafontant A, LCSW 06/19/2016

## 2016-06-21 ENCOUNTER — Encounter (HOSPITAL_COMMUNITY): Payer: Self-pay | Admitting: Clinical

## 2016-06-22 DIAGNOSIS — H2512 Age-related nuclear cataract, left eye: Secondary | ICD-10-CM | POA: Diagnosis not present

## 2016-07-03 ENCOUNTER — Ambulatory Visit (INDEPENDENT_AMBULATORY_CARE_PROVIDER_SITE_OTHER): Payer: PPO | Admitting: Clinical

## 2016-07-03 DIAGNOSIS — F4321 Adjustment disorder with depressed mood: Secondary | ICD-10-CM | POA: Diagnosis not present

## 2016-07-03 DIAGNOSIS — F411 Generalized anxiety disorder: Secondary | ICD-10-CM

## 2016-07-03 DIAGNOSIS — F331 Major depressive disorder, recurrent, moderate: Secondary | ICD-10-CM

## 2016-07-03 NOTE — Progress Notes (Signed)
   THERAPIST PROGRESS NOTE  Session Time: 3:30 - 4:25  Participation Level: Active  Behavioral Response: CasualAlertDepressed  Type of Therapy: Individual Therapy  Treatment Goals addressed: Improve Psychiatric Symptoms, Elevate Mood (being more active, getting out more), Improve Unhelpful Thought Patterns, Discuss and process grief, decrease anxiety   Interventions: Motivational Interviewing, CBT, Grounding & Mindfulness Techniques  Summary: [name] is a 50  y.o. female who presents with Major Depressive Disorder, Recurrent Severe, without psychotic features, and Generalized Anxiety Disorder, and Unresolved Grief  Suicidal/Homicidal: No -without intent/plan  Therapist Response: [name] met with clinician for an individual session. [name] discussed her psychiatric symptoms, her current life events and her homework. [name] shared that she has been very depressed. She shared that she did not complete her homework. Clinician asked open ended questions. [name] shared about the stresses of taking care of her mother in law. Clinician asked open ended questions about her stressors and her negative thoughts. Client and clinician discussed some of her negative thoughts. The evidence for and against the thoughts. [name] was able to formulate healthier alternative thoughts. Clinician asked about how she spends her time when her Mother in law is at adult day care. She shared that she isolates and is anxious. Client and clinician discussed healthier alternative activities which might improve her mood. [name] shared she was really missing her friend [name] who passed away. Client and clinician [name] and the benefits of having friends. Clinician asked open ended questions and [name] shared that she did not think there were other people who she could be friends with like him. Clinician asked open ended questions and [name] had the insight that there might be others out there that could be friends. [name] agreed to do  an outing for homework. Client and clinician agreed to discuss this further at a future sessions  Plan: Return again in 2 weeks.  Diagnosis:     Axis I: Major Depressive Disorder, Recurrent Severe, without psychotic features, and Generalized Anxiety Disorder, and Unresolved Grief    [name] A, LCSW 07/03/2016

## 2016-07-05 DIAGNOSIS — Z9842 Cataract extraction status, left eye: Secondary | ICD-10-CM | POA: Diagnosis not present

## 2016-07-05 DIAGNOSIS — H04123 Dry eye syndrome of bilateral lacrimal glands: Secondary | ICD-10-CM | POA: Diagnosis not present

## 2016-07-05 DIAGNOSIS — Z961 Presence of intraocular lens: Secondary | ICD-10-CM | POA: Diagnosis not present

## 2016-07-05 DIAGNOSIS — H2511 Age-related nuclear cataract, right eye: Secondary | ICD-10-CM | POA: Diagnosis not present

## 2016-07-06 DIAGNOSIS — H2511 Age-related nuclear cataract, right eye: Secondary | ICD-10-CM | POA: Diagnosis not present

## 2016-07-13 DIAGNOSIS — H2511 Age-related nuclear cataract, right eye: Secondary | ICD-10-CM | POA: Diagnosis not present

## 2016-07-15 DIAGNOSIS — H2511 Age-related nuclear cataract, right eye: Secondary | ICD-10-CM | POA: Diagnosis not present

## 2016-07-18 ENCOUNTER — Ambulatory Visit (HOSPITAL_COMMUNITY): Payer: Self-pay | Admitting: Clinical

## 2016-07-18 ENCOUNTER — Ambulatory Visit (INDEPENDENT_AMBULATORY_CARE_PROVIDER_SITE_OTHER): Payer: PPO | Admitting: Clinical

## 2016-07-18 DIAGNOSIS — F411 Generalized anxiety disorder: Secondary | ICD-10-CM | POA: Diagnosis not present

## 2016-07-18 DIAGNOSIS — F4321 Adjustment disorder with depressed mood: Secondary | ICD-10-CM | POA: Diagnosis not present

## 2016-07-18 DIAGNOSIS — F331 Major depressive disorder, recurrent, moderate: Secondary | ICD-10-CM | POA: Diagnosis not present

## 2016-07-18 NOTE — Progress Notes (Signed)
   THERAPIST PROGRESS NOTE  Session Time: 3:35 - 4:28 Participation Level: Active  Behavioral Response: CasualAlertDepressed  Type of Therapy: Individual Therapy  Treatment Goals addressed: Improve Psychiatric Symptoms, Elevate Mood (being more active, getting out more), Improve Unhelpful Thought Patterns, Discuss and process grief, decrease anxiety (reduce restlessness, reduce trembling, reduce angry outburst)  Interventions: Motivational Interviewing, CBT,  Summary: [name] is a 51  y.o. female who presents with Major Depressive Disorder, Recurrent Severe, without psychotic features, and Generalized Anxiety Disorder, and Unresolved Grief  Suicidal/Homicidal: No -without intent/plan  Therapist Response: Tharon met with clinician for an individual session. [name] discussed her psychiatric symptoms and her current life events. [name] shared that she had completed her homework packet 1&2 assertiveness (to reduce anger). Client and clinician reviewed and discussed her homework packet. [name] shared that she expresses her anger in all three ways - aggressive, assertive, and passive. Client and clinician discussed her thoughts about her emotions and expression. Clinician asked open ended questions and [name] shared some of the events she has been experiencing. Her emotions and negative thoughts about the events. Clinician asked open ended questions. And [name] identified the evidence for and against the thoughts. She was then able to formulate healthier alternative thoughts. Clinician asked open ended questions and [name] identified how she would like to feel, Clinician asked open ended questions and [name] identified her actions that were and were not supporting how she would like to feel. Client and clinician discussed steps she could take to be more in alignment with her desired outcomes. [name] agreed to complete packet 3 and bring it back with her to next session.    Plan: Return again in 1  weeks  Diagnosis:     Axis I: Major Depressive Disorder, Recurrent Severe, without psychotic features, and Generalized Anxiety Disorder, and Unresolved Grief    Jaleen Finch A, LCSW 07/18/2016

## 2016-07-19 ENCOUNTER — Encounter (HOSPITAL_COMMUNITY): Payer: Self-pay | Admitting: Clinical

## 2016-07-26 DIAGNOSIS — M4802 Spinal stenosis, cervical region: Secondary | ICD-10-CM | POA: Diagnosis not present

## 2016-07-31 DIAGNOSIS — M659 Synovitis and tenosynovitis, unspecified: Secondary | ICD-10-CM | POA: Diagnosis not present

## 2016-07-31 DIAGNOSIS — Z471 Aftercare following joint replacement surgery: Secondary | ICD-10-CM | POA: Diagnosis not present

## 2016-07-31 DIAGNOSIS — Z96652 Presence of left artificial knee joint: Secondary | ICD-10-CM | POA: Diagnosis not present

## 2016-07-31 DIAGNOSIS — M47816 Spondylosis without myelopathy or radiculopathy, lumbar region: Secondary | ICD-10-CM | POA: Diagnosis not present

## 2016-08-02 ENCOUNTER — Ambulatory Visit (HOSPITAL_COMMUNITY): Payer: Self-pay | Admitting: Clinical

## 2016-08-17 ENCOUNTER — Ambulatory Visit (INDEPENDENT_AMBULATORY_CARE_PROVIDER_SITE_OTHER): Payer: PPO | Admitting: Psychiatry

## 2016-08-17 ENCOUNTER — Encounter (HOSPITAL_COMMUNITY): Payer: Self-pay | Admitting: Psychiatry

## 2016-08-17 DIAGNOSIS — F5105 Insomnia due to other mental disorder: Secondary | ICD-10-CM | POA: Diagnosis not present

## 2016-08-17 DIAGNOSIS — F99 Mental disorder, not otherwise specified: Secondary | ICD-10-CM | POA: Diagnosis not present

## 2016-08-17 DIAGNOSIS — F332 Major depressive disorder, recurrent severe without psychotic features: Secondary | ICD-10-CM

## 2016-08-17 DIAGNOSIS — F411 Generalized anxiety disorder: Secondary | ICD-10-CM | POA: Diagnosis not present

## 2016-08-17 DIAGNOSIS — Z79899 Other long term (current) drug therapy: Secondary | ICD-10-CM

## 2016-08-17 MED ORDER — PAROXETINE HCL 40 MG PO TABS: 60.0000 mg | ORAL_TABLET | Freq: Every day | ORAL | 3 refills | Status: DC

## 2016-08-17 MED ORDER — TRAZODONE HCL 100 MG PO TABS: 100.0000 mg | ORAL_TABLET | Freq: Every evening | ORAL | 3 refills | Status: DC | PRN
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
## 2016-08-18 DIAGNOSIS — Z96652 Presence of left artificial knee joint: Secondary | ICD-10-CM | POA: Diagnosis not present

## 2016-08-18 DIAGNOSIS — T8484XD Pain due to internal orthopedic prosthetic devices, implants and grafts, subsequent encounter: Secondary | ICD-10-CM | POA: Diagnosis not present

## 2016-08-18 DIAGNOSIS — M7052 Other bursitis of knee, left knee: Secondary | ICD-10-CM | POA: Diagnosis not present

## 2016-08-21 ENCOUNTER — Encounter (HOSPITAL_COMMUNITY): Payer: Self-pay | Admitting: Clinical

## 2016-08-21 ENCOUNTER — Ambulatory Visit (INDEPENDENT_AMBULATORY_CARE_PROVIDER_SITE_OTHER): Payer: PPO | Admitting: Clinical

## 2016-08-21 DIAGNOSIS — F4321 Adjustment disorder with depressed mood: Secondary | ICD-10-CM

## 2016-08-21 DIAGNOSIS — F411 Generalized anxiety disorder: Secondary | ICD-10-CM

## 2016-08-21 DIAGNOSIS — F332 Major depressive disorder, recurrent severe without psychotic features: Secondary | ICD-10-CM | POA: Diagnosis not present

## 2016-08-21 NOTE — Progress Notes (Signed)
   THERAPIST PROGRESS NOTE  Session Time: 3:30 - 4:18   Participation Level: Active  Behavioral Response: CasualAlertDepressed  Type of Therapy: Individual Therapy  Treatment Goals addressed: Improve Psychiatric Symptoms, Elevate Mood (being more active, getting out more), Improve Unhelpful Thought Patterns,decrease anxiety ( reduce angry outburst)  Interventions: Motivational Interviewing, CBT, Grounding & Mindfulness Techniques  Summary: [name] is a 51  y.o. female who presents with Major Depressive Disorder, Recurrent Severe, without psychotic features, and Generalized Anxiety Disorder, and Unresolved Grief  Suicidal/Homicidal: No -without intent/plan  Therapist Response: [name] met with clinician for an individual session. [name] discussed her psychiatric symptoms and her current life events. [name] shared that she is had some ups and downs with her psychiatric symptoms. She shared that she got a clean bill of health from her doctor to start being able to work out and to attend physical therapy. She shared that this made her feel little more hopeful since the pain associated with her back in her knee his caused her to be sedentary. She shared that today she is feeling a little better but recently she has been sleeping a lot more. She stated that she did not bring her homework. Clinician asked open ended questions and [name] shared the things that been most challenging for her since last session. She shared some of her negative automatic thoughts. Client and clinician discussed the evidence for and against those automatic thoughts. Margarine was then formulated healthier alternative thoughts. Client and clinician discussed how her actions might change and how her emotions might change if she believed the healthier alternative thoughts. Client and clinician reviewed grounding and mindfulness techniques. Client and clinician discussed using these daily and when she feels her anger increasing.  [name] agreed to complete her homework and bring it with her to next session.   Plan: Return again in 1 weeks  Diagnosis:     Axis I: Major Depressive Disorder, Recurrent Severe, without psychotic features, and Generalized Anxiety Disorder, and Unresolved Grief    Mattie Nordell A, LCSW 08/21/2016

## 2016-09-11 ENCOUNTER — Ambulatory Visit (HOSPITAL_COMMUNITY): Payer: Self-pay | Admitting: Clinical

## 2016-09-13 DIAGNOSIS — G8929 Other chronic pain: Secondary | ICD-10-CM | POA: Diagnosis not present

## 2016-09-13 DIAGNOSIS — M25562 Pain in left knee: Secondary | ICD-10-CM | POA: Diagnosis not present

## 2016-09-18 DIAGNOSIS — M25562 Pain in left knee: Secondary | ICD-10-CM | POA: Diagnosis not present

## 2016-09-18 DIAGNOSIS — G8929 Other chronic pain: Secondary | ICD-10-CM | POA: Diagnosis not present

## 2016-09-21 DIAGNOSIS — G8929 Other chronic pain: Secondary | ICD-10-CM | POA: Diagnosis not present

## 2016-09-21 DIAGNOSIS — M25562 Pain in left knee: Secondary | ICD-10-CM | POA: Diagnosis not present

## 2016-09-25 ENCOUNTER — Ambulatory Visit (HOSPITAL_COMMUNITY): Payer: Self-pay | Admitting: Clinical

## 2016-09-25 DIAGNOSIS — G8929 Other chronic pain: Secondary | ICD-10-CM | POA: Diagnosis not present

## 2016-09-25 DIAGNOSIS — M25562 Pain in left knee: Secondary | ICD-10-CM | POA: Diagnosis not present

## 2016-09-28 DIAGNOSIS — M25562 Pain in left knee: Secondary | ICD-10-CM | POA: Diagnosis not present

## 2016-09-28 DIAGNOSIS — G8929 Other chronic pain: Secondary | ICD-10-CM | POA: Diagnosis not present

## 2016-10-05 DIAGNOSIS — M25562 Pain in left knee: Secondary | ICD-10-CM | POA: Diagnosis not present

## 2016-10-05 DIAGNOSIS — G8929 Other chronic pain: Secondary | ICD-10-CM | POA: Diagnosis not present

## 2016-10-09 ENCOUNTER — Ambulatory Visit (INDEPENDENT_AMBULATORY_CARE_PROVIDER_SITE_OTHER): Payer: PPO | Admitting: Clinical

## 2016-10-09 ENCOUNTER — Encounter (HOSPITAL_COMMUNITY): Payer: Self-pay | Admitting: Clinical

## 2016-10-09 DIAGNOSIS — G8929 Other chronic pain: Secondary | ICD-10-CM | POA: Diagnosis not present

## 2016-10-09 DIAGNOSIS — F332 Major depressive disorder, recurrent severe without psychotic features: Secondary | ICD-10-CM

## 2016-10-09 DIAGNOSIS — M25562 Pain in left knee: Secondary | ICD-10-CM | POA: Diagnosis not present

## 2016-10-09 NOTE — Progress Notes (Signed)
   THERAPIST PROGRESS NOTE  Session Time: 1:30 - 2:25  Participation Level: Active  Behavioral Response: CasualAlertNA  Type of Therapy: Individual Therapy  Treatment Goals addressed: Improve Psychiatric Symptoms, Elevate Mood, Improve Unhelpful Thought Patterns,  decrease anxiety   Interventions: Motivational Interviewing, CBT,   Summary: [name] is a 51  y.o. female who presents with Major Depressive Disorder, Recurrent Severe, without psychotic features, and Generalized Anxiety Disorder, and Unresolved Grief  Suicidal/Homicidal: No -without intent/plan  Therapist Response: Cheyla met with clinician for an individual session. [name] discussed her psychiatric symptoms and her current life events. [name] shared that she had been feeling a little bit better since last session. Clinician asked open ended questions and she shared that they had sold a portion of their property which will relieve some financial burdens. She shared that her husband had back surgery so his brother had kept her mother in law which gave her a break. She shared that she is still struggling with some of her thoughts and emotions about her mother in law (causing anxiety and depression). Client and clinician used a 7 panel thought record sheet. clinician asked open ended and clarifying questions and Lachlan identified and rated her emotions, identified her negative automatic thoughts, She identified the evidence for and against the thoughts. She was then able to formulate healthier alternative thoughts. Client and clinician discussed her thoughts and insights.    Plan: Return again in 1 weeks  Diagnosis:     Axis I: Major Depressive Disorder, Recurrent Severe, without psychotic features, and Generalized Anxiety Disorder, and Unresolved Grief    Abbigail Anstey A, LCSW 10/09/2016

## 2016-10-12 DIAGNOSIS — M25562 Pain in left knee: Secondary | ICD-10-CM | POA: Diagnosis not present

## 2016-10-12 DIAGNOSIS — G8929 Other chronic pain: Secondary | ICD-10-CM | POA: Diagnosis not present

## 2016-10-16 ENCOUNTER — Other Ambulatory Visit: Payer: Self-pay | Admitting: Family Medicine

## 2016-10-16 DIAGNOSIS — R1032 Left lower quadrant pain: Secondary | ICD-10-CM | POA: Diagnosis not present

## 2016-10-16 DIAGNOSIS — R109 Unspecified abdominal pain: Secondary | ICD-10-CM | POA: Diagnosis not present

## 2016-10-17 ENCOUNTER — Ambulatory Visit
Admission: RE | Admit: 2016-10-17 | Discharge: 2016-10-17 | Disposition: A | Discharge status: Home / Self Care | Payer: PPO | Source: Ambulatory Visit | Attending: Family Medicine | Admitting: Family Medicine

## 2016-10-17 DIAGNOSIS — R1032 Left lower quadrant pain: Secondary | ICD-10-CM

## 2016-10-17 MED ORDER — IOPAMIDOL (ISOVUE-300) INJECTION 61%
100.0000 mL | Freq: Once | INTRAVENOUS | Status: AC | PRN
  Administered 2016-10-17: 100 mL via INTRAVENOUS

## 2016-10-19 ENCOUNTER — Other Ambulatory Visit: Payer: Self-pay

## 2016-10-25 ENCOUNTER — Encounter (HOSPITAL_COMMUNITY): Payer: Self-pay | Admitting: Clinical

## 2016-10-25 ENCOUNTER — Ambulatory Visit (INDEPENDENT_AMBULATORY_CARE_PROVIDER_SITE_OTHER): Payer: Self-pay | Admitting: Clinical

## 2016-10-25 DIAGNOSIS — G8929 Other chronic pain: Secondary | ICD-10-CM | POA: Diagnosis not present

## 2016-10-25 DIAGNOSIS — F332 Major depressive disorder, recurrent severe without psychotic features: Secondary | ICD-10-CM

## 2016-10-25 DIAGNOSIS — M25562 Pain in left knee: Secondary | ICD-10-CM | POA: Diagnosis not present

## 2016-10-25 DIAGNOSIS — F411 Generalized anxiety disorder: Secondary | ICD-10-CM

## 2016-10-25 DIAGNOSIS — F4321 Adjustment disorder with depressed mood: Secondary | ICD-10-CM

## 2016-10-25 NOTE — Progress Notes (Signed)
   THERAPIST PROGRESS NOTE  Session Time: 3:32 -4:27  Participation Level: Active  Behavioral Response: CasualAlertAnxious  Type of Therapy: Individual Therapy  Treatment Goals addressed: Improve Psychiatric Symptoms, Elevate Mood, Improve Unhelpful Thought Patterns,  decrease anxiety   Interventions: Motivational Interviewing, CBT, GroundingTechniques  Summary: [name] is a 51  y.o. female who presents with Major Depressive Disorder, Recurrent Severe, without psychotic features, and Generalized Anxiety Disorder, and Unresolved Grief  Suicidal/Homicidal: No -without intent/plan  Therapist Response: Elona met with clinician for an individual session. Christabelle discussed her psychiatric symptoms and her current life events. Selita shared that she was doing a little bit better this past week. Michaelina shared that she has however been experiencing a lot of back pain which does increase her depression. She shared that she has had some weight loss (on purpose), has begun to be more active, and was also doing a little bit better disengaging in her negative thoughts about her mother in law ( she is her mother in laws primary care taker). Preslee shared some of the negative thoughts she continues to have that cause her anxiety and depression. Clinician asked open ended questions about how she currently felt when she thought those thoughts and how she would like to feel. Clinician asked open ended question about  her thoughts and other alternative perspectives. She was then able to identify alternative perspectives that were true and believable. She had the insight that the alternative perspectives would allow her to improve her mood and stress level. Clinciian taught Kaelani a new grounding technique. Client and clinician practiced this grounding technique together.    Plan: Return again in 1 weeks  Diagnosis:     Axis I: Major Depressive Disorder, Recurrent Severe, without psychotic features, and  Generalized Anxiety Disorder, and Unresolved Grief    Shirleyann Montero A, LCSW 10/25/2016

## 2016-10-27 DIAGNOSIS — G8929 Other chronic pain: Secondary | ICD-10-CM | POA: Diagnosis not present

## 2016-10-27 DIAGNOSIS — M25562 Pain in left knee: Secondary | ICD-10-CM | POA: Diagnosis not present

## 2016-11-01 DIAGNOSIS — M25562 Pain in left knee: Secondary | ICD-10-CM | POA: Diagnosis not present

## 2016-11-01 DIAGNOSIS — G8929 Other chronic pain: Secondary | ICD-10-CM | POA: Diagnosis not present

## 2016-11-02 ENCOUNTER — Encounter: Payer: Self-pay | Admitting: Physician Assistant

## 2016-11-06 ENCOUNTER — Ambulatory Visit (INDEPENDENT_AMBULATORY_CARE_PROVIDER_SITE_OTHER): Payer: Self-pay | Admitting: Clinical

## 2016-11-06 ENCOUNTER — Encounter (HOSPITAL_COMMUNITY): Payer: Self-pay | Admitting: Clinical

## 2016-11-06 DIAGNOSIS — F332 Major depressive disorder, recurrent severe without psychotic features: Secondary | ICD-10-CM

## 2016-11-06 DIAGNOSIS — F411 Generalized anxiety disorder: Secondary | ICD-10-CM

## 2016-11-06 DIAGNOSIS — F4321 Adjustment disorder with depressed mood: Secondary | ICD-10-CM

## 2016-11-06 NOTE — Progress Notes (Signed)
   THERAPIST PROGRESS NOTE  Session Time: 12:03 - 1:00pm  Participation Level: Active  Behavioral Response: CasualAlertDepressed  Type of Therapy: Individual Therapy  Treatment Goals addressed: Improve Psychiatric Symptoms, Elevate Mood (being more active, getting out more), Improve Unhelpful Thought Patterns, Discuss and process grief,   Interventions: Motivational Interviewing, CBT, Grounding Techniques  Summary: [name] is a 52  y.o. female who presents with Major Depressive Disorder, Recurrent Severe, without psychotic features, and Generalized Anxiety Disorder, and Unresolved Grief  Suicidal/Homicidal: No -without intent/plan  Therapist Response: [name] met with [name] for an individual session. [name] discussed her psychiatric symptoms and her current life events. [name] shared that that she has been experiencing a lot of grief and depression recently. [name] asked open ended questions and  [name] shared that she was stressing a lot about her health issues, grieving about her friends death (several years ago), and because she has been isolating. [name] asked open ended questions about [name]. She shared her sadness about her friend who passed as well as the falling out of two super close [name].[name] discussed grief and the beliefs she has created from her experience.  She shared that she is unlikely to make close friends because it is too painful. [name] asked open ended question and [name] identified the pains of friendship as well as being alone. [name] discussed how interactions with others could help improve her mood. [name] asked open ended questions and [name] identified the positive aspects of interacting with others and having friends. [name] discussed low stress opportunities for her to interact with others. Cliniciian taught her a new grounding technique. [name] practiced the technique  together.   Plan: Return again in 1 weeks  Diagnosis:     Axis I: Major Depressive Disorder, Recurrent Severe, without psychotic features, and Generalized Anxiety Disorder, and Unresolved Grief    [name] A, LCSW 11/06/2016

## 2016-11-08 ENCOUNTER — Encounter: Payer: Self-pay | Admitting: Gastroenterology

## 2016-11-08 ENCOUNTER — Encounter: Payer: Self-pay | Admitting: Physician Assistant

## 2016-11-08 ENCOUNTER — Encounter (INDEPENDENT_AMBULATORY_CARE_PROVIDER_SITE_OTHER): Payer: Self-pay

## 2016-11-08 ENCOUNTER — Ambulatory Visit (INDEPENDENT_AMBULATORY_CARE_PROVIDER_SITE_OTHER): Payer: PPO | Admitting: Physician Assistant

## 2016-11-08 ENCOUNTER — Other Ambulatory Visit: Payer: PPO

## 2016-11-08 VITALS — BP 104/62 | HR 88 | Ht 63.5 in | Wt 270.0 lb

## 2016-11-08 DIAGNOSIS — R1032 Left lower quadrant pain: Secondary | ICD-10-CM | POA: Diagnosis not present

## 2016-11-08 DIAGNOSIS — R197 Diarrhea, unspecified: Secondary | ICD-10-CM

## 2016-11-08 DIAGNOSIS — R634 Abnormal weight loss: Secondary | ICD-10-CM

## 2016-11-08 MED ORDER — NA SULFATE-K SULFATE-MG SULF 17.5-3.13-1.6 GM/177ML PO SOLN: 1.0000 | ORAL | 0 refills | Status: DC
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
## 2016-11-08 NOTE — Progress Notes (Signed)
Reviewed and agree with initial management plan.  Malcolm T. Stark, MD FACG 

## 2016-11-08 NOTE — Patient Instructions (Signed)
Your physician has requested that you go to the basement for lab work before leaving today.  You have been scheduled for a colonoscopy. Please follow written instructions given to you at your visit today.  Please pick up your prep supplies at the pharmacy within the next 1-3 days. If you use inhalers (even only as needed), please bring them with you on the day of your procedure. Your physician has requested that you go to www.startemmi.com and enter the access code given to you at your visit today. This web site gives a general overview about your procedure. However, you should still follow specific instructions given to you by our office regarding your preparation for the procedure.   

## 2016-11-13 ENCOUNTER — Other Ambulatory Visit: Payer: PPO

## 2016-11-13 DIAGNOSIS — R1032 Left lower quadrant pain: Secondary | ICD-10-CM | POA: Diagnosis not present

## 2016-11-13 DIAGNOSIS — R197 Diarrhea, unspecified: Secondary | ICD-10-CM | POA: Diagnosis not present

## 2016-11-13 DIAGNOSIS — R634 Abnormal weight loss: Secondary | ICD-10-CM

## 2016-11-14 ENCOUNTER — Ambulatory Visit (AMBULATORY_SURGERY_CENTER): Payer: PPO | Admitting: Gastroenterology

## 2016-11-14 ENCOUNTER — Encounter: Payer: Self-pay | Admitting: Gastroenterology

## 2016-11-14 VITALS — BP 97/66 | HR 62 | Temp 98.7°F | Resp 12 | Ht 63.5 in | Wt 270.0 lb

## 2016-11-14 DIAGNOSIS — R197 Diarrhea, unspecified: Secondary | ICD-10-CM | POA: Diagnosis not present

## 2016-11-14 DIAGNOSIS — R198 Other specified symptoms and signs involving the digestive system and abdomen: Secondary | ICD-10-CM

## 2016-11-14 DIAGNOSIS — F329 Major depressive disorder, single episode, unspecified: Secondary | ICD-10-CM | POA: Diagnosis not present

## 2016-11-14 DIAGNOSIS — F319 Bipolar disorder, unspecified: Secondary | ICD-10-CM | POA: Diagnosis not present

## 2016-11-14 DIAGNOSIS — K621 Rectal polyp: Secondary | ICD-10-CM | POA: Diagnosis not present

## 2016-11-14 DIAGNOSIS — R1032 Left lower quadrant pain: Secondary | ICD-10-CM

## 2016-11-14 DIAGNOSIS — G4733 Obstructive sleep apnea (adult) (pediatric): Secondary | ICD-10-CM | POA: Diagnosis not present

## 2016-11-14 DIAGNOSIS — M797 Fibromyalgia: Secondary | ICD-10-CM | POA: Diagnosis not present

## 2016-11-14 DIAGNOSIS — I10 Essential (primary) hypertension: Secondary | ICD-10-CM | POA: Diagnosis not present

## 2016-11-14 DIAGNOSIS — F4310 Post-traumatic stress disorder, unspecified: Secondary | ICD-10-CM | POA: Diagnosis not present

## 2016-11-14 DIAGNOSIS — D128 Benign neoplasm of rectum: Secondary | ICD-10-CM | POA: Diagnosis not present

## 2016-11-14 DIAGNOSIS — R194 Change in bowel habit: Secondary | ICD-10-CM | POA: Diagnosis not present

## 2016-11-14 LAB — OVA AND PARASITE EXAMINATION: OP: NONE SEEN

## 2016-11-14 MED ORDER — SODIUM CHLORIDE 0.9 % IV SOLN: 500.0000 mL | INTRAVENOUS | Status: DC

## 2016-11-14 NOTE — Progress Notes (Signed)
Called to room to assist during endoscopic procedure.  Patient ID and intended procedure confirmed with present staff. Received instructions for my participation in the procedure from the performing physician.  

## 2016-11-14 NOTE — Patient Instructions (Signed)
YOU HAD AN ENDOSCOPIC PROCEDURE TODAY AT THE [facility]:   Refer to the procedure report that was given to you for any specific questions about what was found during the examination.  If the procedure report does not answer your questions, please call your gastroenterologist to clarify.  If you requested that your care partner not be given the details of your procedure findings, then the procedure report has been included in a sealed envelope for you to review at your convenience later.  YOU SHOULD EXPECT: Some feelings of bloating in the abdomen. Passage of more gas than usual.  Walking can help get rid of the air that was put into your GI tract during the procedure and reduce the bloating. If you had a lower endoscopy (such as a colonoscopy or flexible sigmoidoscopy) you may notice spotting of blood in your stool or on the toilet paper. If you underwent a bowel prep for your procedure, you may not have a normal bowel movement for a few days.  Please Note:  You might notice some irritation and congestion in your nose or some drainage.  This is from the oxygen used during your procedure.  There is no need for concern and it should clear up in a day or so.  SYMPTOMS TO REPORT IMMEDIATELY:   Following lower endoscopy (colonoscopy or flexible sigmoidoscopy):  Excessive amounts of blood in the stool  Significant tenderness or worsening of abdominal pains  Swelling of the abdomen that is new, acute  Fever of 100F or higher    For urgent or emergent issues, a gastroenterologist can be reached at any hour by calling [phone].   DIET:  We do recommend a small meal at first, but then you may proceed to your regular diet.  Drink plenty of fluids but you should avoid alcoholic beverages for 24 hours.  ACTIVITY:  You should plan to take it easy for the rest of today and you should NOT DRIVE or use heavy machinery until tomorrow (because of the sedation medicines used during the test).     FOLLOW UP: Our staff will call the number listed on your records the next business day following your procedure to check on you and address any questions or concerns that you may have regarding the information given to you following your procedure. If we do not reach you, we will leave a message.  However, if you are feeling well and you are not experiencing any problems, there is no need to return our call.  We will assume that you have returned to your regular daily activities without incident.  If any biopsies were taken you will be contacted by phone or by letter within the next 1-3 weeks.  Please call us at [phone] if you have not heard about the biopsies in 3 weeks.    SIGNATURES/CONFIDENTIALITY: You and/or your care partner have signed paperwork which will be entered into your electronic medical record.  These signatures attest to the fact that that the information above on your After Visit Summary has been reviewed and is understood.  Full responsibility of the confidentiality of this discharge information lies with you and/or your care-partner.   Resume medications. Information given on polyps. 

## 2016-11-14 NOTE — Progress Notes (Signed)
Report to PACU, RN, vss, BBS= Clear.  

## 2016-11-14 NOTE — Progress Notes (Signed)
Pt has several reddened area to left arm and chest- pt states she had poison ivy over 1 week ago- no open areas any longer

## 2016-11-14 NOTE — Op Note (Signed)
[facility] Patient Name: [name] Procedure Date: 11/14/2016 10:12 AM MRN: [id number] Endoscopist: Ladene Artist , MD Age: 51 Referring MD:  Date of Birth: 1966/03/29 Gender: Female Account #: [id number] Procedure:                Colonoscopy Indications:              Abdominal pain in the left lower quadrant, Change                            in bowel habits Medicines:                Monitored Anesthesia Care Procedure:                Pre-Anesthesia Assessment:                           - Prior to the procedure, a History and Physical                            was performed, and patient medications and                            allergies were reviewed. The patient's tolerance of                            previous anesthesia was also reviewed. The risks                            and benefits of the procedure and the sedation                            options and risks were discussed with the patient.                            All questions were answered, and informed consent                            was obtained. Prior Anticoagulants: The patient has                            taken no previous anticoagulant or antiplatelet                            agents. ASA Grade Assessment: II - A patient with                            mild systemic disease. After reviewing the risks                            and benefits, the patient was deemed in                            satisfactory condition to undergo the procedure.  After obtaining informed consent, the colonoscope                            was passed under direct vision. Throughout the                            procedure, the patient's blood pressure, pulse, and                            oxygen saturations were monitored continuously. The                            Colonoscope was introduced through the anus and                            advanced to the the cecum, identified by                           appendiceal orifice and ileocecal valve. The                            ileocecal valve, appendiceal orifice, and rectum                            were photographed. The quality of the bowel                            preparation was good after extensive lavage and                            suctioning. The colonoscopy was performed without                            difficulty. The patient tolerated the procedure                            well. Scope In: 10:26:09 AM Scope Out: 10:42:48 AM Scope Withdrawal Time: 0 hours 15 minutes 10 seconds  Total Procedure Duration: 0 hours 16 minutes 39 seconds  Findings:                 The perianal and digital rectal examinations were                            normal.                           A 6 mm polyp was found in the rectum. The polyp was                            sessile. The polyp was removed with a cold snare.                            Resection and retrieval were complete.  The exam was otherwise without abnormality on                            direct and retroflexion views. Complications:            No immediate complications. Estimated blood loss:                            None. Estimated Blood Loss:     Estimated blood loss: none. Impression:               - One 6 mm polyp in the rectum, removed with a cold                            snare. Resected and retrieved.                           - The examination was otherwise normal on direct                            and retroflexion views. Recommendation:           - Repeat colonoscopy in 5 years for surveillance if                            polyp is precancerous otherwise 10 years with a                            more extensive bowel prep.                           - Patient has a contact number available for                            emergencies. The signs and symptoms of potential                            delayed  complications were discussed with the                            patient. Return to normal activities tomorrow.                            Written discharge instructions were provided to the                            patient.                           - Resume previous diet.                           - Continue present medications.                           - Await pathology results. Ladene Artist, MD 11/14/2016 10:46:25 AM This report has  been signed electronically.

## 2016-11-15 ENCOUNTER — Ambulatory Visit (INDEPENDENT_AMBULATORY_CARE_PROVIDER_SITE_OTHER): Payer: PPO | Admitting: Clinical

## 2016-11-15 ENCOUNTER — Telehealth: Payer: Self-pay | Admitting: *Deleted

## 2016-11-15 ENCOUNTER — Encounter (HOSPITAL_COMMUNITY): Payer: Self-pay | Admitting: Clinical

## 2016-11-15 DIAGNOSIS — F411 Generalized anxiety disorder: Secondary | ICD-10-CM | POA: Diagnosis not present

## 2016-11-15 DIAGNOSIS — F4321 Adjustment disorder with depressed mood: Secondary | ICD-10-CM | POA: Diagnosis not present

## 2016-11-15 DIAGNOSIS — F332 Major depressive disorder, recurrent severe without psychotic features: Secondary | ICD-10-CM | POA: Diagnosis not present

## 2016-11-15 NOTE — Telephone Encounter (Signed)
  Follow up Call-  Call back number 11/14/2016  Post procedure Call Back phone  # [phone]  Permission to leave phone message Yes  Some recent data might be hidden     Patient questions:  Do you have a fever, pain , or abdominal swelling? no Pain Score  0 *  Have you tolerated food without any problems? Yes.    Have you been able to return to your normal activities? Yes.    Do you have any questions about your discharge instructions: Diet   No. Medications  No. Follow up visit  No.  Do you have questions or concerns about your Care? No.  Actions: * If pain score is 4 or above: No action needed, pain <4.

## 2016-11-15 NOTE — Progress Notes (Signed)
   THERAPIST PROGRESS NOTE  Session Time: 11:02 - 11:57  Participation Level: Active  Behavioral Response: CasualAlertDepressed  Type of Therapy: Individual Therapy  Treatment Goals addressed: Improve Psychiatric Symptoms, Elevate Mood  Discuss and process grief, decrease anxiety   Interventions: Motivational Interviewing,Grounding & Mindfulness Techniques  Summary: [name] is a 51  y.o. female who presents with Major Depressive Disorder, Recurrent Severe, without psychotic features, and Generalized Anxiety Disorder, and Unresolved Grief  Suicidal/Homicidal: No -without intent/plan  Therapist Response: [name] met with clinician for an individual session. [name] discussed her psychiatric symptoms and her current life events. [name] shared that she has been struggling with her depression but doing better with her grief. Clinician asked open ended questions and she shared that they found a tumor in her mother's lung. Clinician asked open ended questions and [name] shared her fears and concerns. She then shared her plans if worse case senerio happened. Client and clinician reviewed and discussed how to use grounding and mindfulness techniques. Client and clinician discussed how staying present in the moment would help decrease her stress.Client and clinician discussed her friend's death anniversary. She shared her improving acceptance of her friend's death.  Client and clinician discussed the grief process.  Plan: Return again in 1 weeks  Diagnosis:     Axis I: Major Depressive Disorder, Recurrent Severe, without psychotic features, and Generalized Anxiety Disorder, and Unresolved Grief    Rei Medlen A, LCSW 11/15/2016

## 2016-11-16 ENCOUNTER — Encounter (HOSPITAL_COMMUNITY): Payer: Self-pay | Admitting: Psychiatry

## 2016-11-16 ENCOUNTER — Ambulatory Visit (INDEPENDENT_AMBULATORY_CARE_PROVIDER_SITE_OTHER): Payer: PPO | Admitting: Psychiatry

## 2016-11-16 DIAGNOSIS — F332 Major depressive disorder, recurrent severe without psychotic features: Secondary | ICD-10-CM | POA: Diagnosis not present

## 2016-11-16 DIAGNOSIS — G4700 Insomnia, unspecified: Secondary | ICD-10-CM | POA: Diagnosis not present

## 2016-11-16 DIAGNOSIS — F411 Generalized anxiety disorder: Secondary | ICD-10-CM

## 2016-11-16 DIAGNOSIS — Z79899 Other long term (current) drug therapy: Secondary | ICD-10-CM

## 2016-11-16 LAB — GASTROINTESTINAL PATHOGEN PANEL PCR
C. difficile Tox A/B, PCR: NOT DETECTED
CRYPTOSPORIDIUM, PCR: NOT DETECTED
Campylobacter, PCR: NOT DETECTED
E COLI (ETEC) LT/ST, PCR: NOT DETECTED
E COLI 0157, PCR: NOT DETECTED
E coli (STEC) stx1/stx2, PCR: NOT DETECTED
GIARDIA LAMBLIA, PCR: NOT DETECTED
Norovirus, PCR: NOT DETECTED
Rotavirus A, PCR: NOT DETECTED
Salmonella, PCR: NOT DETECTED
Shigella, PCR: NOT DETECTED

## 2016-11-16 MED ORDER — PAROXETINE HCL 40 MG PO TABS: 60.0000 mg | ORAL_TABLET | Freq: Every day | ORAL | 3 refills | Status: DC
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
## 2016-11-27 ENCOUNTER — Encounter: Payer: Self-pay | Admitting: Gastroenterology

## 2016-11-29 ENCOUNTER — Ambulatory Visit (HOSPITAL_COMMUNITY): Payer: Self-pay | Admitting: Clinical

## 2016-12-13 ENCOUNTER — Ambulatory Visit (INDEPENDENT_AMBULATORY_CARE_PROVIDER_SITE_OTHER): Payer: PPO | Admitting: Clinical

## 2016-12-13 ENCOUNTER — Telehealth (HOSPITAL_COMMUNITY): Payer: Self-pay

## 2016-12-13 VITALS — BP 132/72 | HR 65 | Ht 62.75 in | Wt 266.6 lb

## 2016-12-13 DIAGNOSIS — F332 Major depressive disorder, recurrent severe without psychotic features: Secondary | ICD-10-CM | POA: Diagnosis not present

## 2016-12-13 DIAGNOSIS — F99 Mental disorder, not otherwise specified: Principal | ICD-10-CM

## 2016-12-13 DIAGNOSIS — F5105 Insomnia due to other mental disorder: Secondary | ICD-10-CM

## 2016-12-13 DIAGNOSIS — F411 Generalized anxiety disorder: Secondary | ICD-10-CM | POA: Diagnosis not present

## 2016-12-13 NOTE — Telephone Encounter (Signed)
Patient was here to see Tharon Aquas and stopped in to see Shawn. Patient is currently taking care of her mother-in-law and just found out that her mother was just diagnosed with cancer. Patient is feeling stressed and overwhelmed and is not sleeping. She has restarted her Trazodone but it is not helping. Patient is very tearful and would really like some help. Please review and advise, thank you

## 2016-12-13 NOTE — Progress Notes (Signed)
   THERAPIST PROGRESS NOTE  Session Time: 12:30 - 1:07   Participation Level: Active  Behavioral Response: CasualAlertDepressed  Type of Therapy: Individual Therapy  Treatment Goals addressed: Improve Psychiatric Symptoms, Elevate Mood, I decrease anxiety,  healthy coping skills   Interventions: Motivational Interviewing, CBT, Grounding & Mindfulness Techniques  Summary: [name] is a 51  y.o. female who presents with Major Depressive Disorder, Recurrent Severe, without psychotic features, and Generalized Anxiety Disorder  Suicidal/Homicidal: No -without intent/plan  Therapist Response: [name] met with clinician for an individual session. Dayami discussed her psychiatric symptoms and her current life events. [name] and clinician reviewed and updated her assessment and treatment plan. Clinician asked open ended questions and [name] shared that she has been very depressed and anxious. She shared that her life stressors had increased dramatically. She shared that her Mother was just diagnosed with lung cancer. She is also caring for her Husband's mother who has dementia. She shared that she has very little support. She cried a lot. She denied any current suicidal or homicidal ideation. Client and clinician discussed skills to decrease her depression and anxiety as well as other healthy coping skills.  Plan: Return again in 1 weeks  Diagnosis:     Axis I: Major Depressive Disorder, Recurrent Severe, without psychotic features, and Generalized Anxiety Disorder     Tanazia Achee A, LCSW 12/13/2016
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
## 2016-12-14 MED ORDER — TRAZODONE HCL 100 MG PO TABS: 150.0000 mg | ORAL_TABLET | Freq: Every evening | ORAL | 3 refills | Status: DC | PRN

## 2016-12-14 NOTE — Telephone Encounter (Signed)
We can increase the Trazodone to 150mg  qHS.

## 2016-12-14 NOTE — Telephone Encounter (Signed)
Okay, I called patient and let her know and then I sent a new order to the pharmacy.

## 2016-12-18 ENCOUNTER — Encounter (HOSPITAL_COMMUNITY): Payer: Self-pay | Admitting: Clinical

## 2016-12-19 ENCOUNTER — Encounter (HOSPITAL_COMMUNITY): Payer: Self-pay | Admitting: Clinical

## 2016-12-19 ENCOUNTER — Ambulatory Visit (INDEPENDENT_AMBULATORY_CARE_PROVIDER_SITE_OTHER): Payer: PPO | Admitting: Clinical

## 2016-12-19 DIAGNOSIS — F411 Generalized anxiety disorder: Secondary | ICD-10-CM

## 2016-12-19 DIAGNOSIS — F332 Major depressive disorder, recurrent severe without psychotic features: Secondary | ICD-10-CM | POA: Diagnosis not present

## 2016-12-19 NOTE — Progress Notes (Signed)
   THERAPIST PROGRESS NOTE  Session Time: 9:02 - 9:57  Participation Level: Active  Behavioral Response: CasualAlertDepressed  Type of Therapy: Individual Therapy  Treatment Goals addressed: Improve Psychiatric Symptoms, Elevate Mood, Improve Unhelpful Thought Patterns, decrease anxiety,  healthy coping skills   Interventions: Motivational Interviewing, CBT, Grounding & Mindfulness Techniques  Summary: [name] is a 51  y.o. female who presents with Major Depressive Disorder, Recurrent Severe, without psychotic features, and Generalized Anxiety Disorder  Suicidal/Homicidal: No -without intent/plan  Therapist Response: [name] met with clinician for an individual session. [name] discussed her psychiatric symptoms and her current life events. [name] shared that she has been having a very difficult time lately. Clinician asked open ended questions and [name] shared that her Mother's cancer was getting bigger and that she had a lot of trauma due to oxygen issues. She shared her mother is currently in ICU and that [name] did not want to come to her therapy session but did because her mother insisted, telling [name] that she needed too. [name] was tearful as she spoke about her mother. [name] is the current care taker for her mother in law. Her brother in law refused to take over caring for her for the next several months so that [name] could take care of her Mother. [name]'s husband quit his job to take care of his mother. She was tearful as she spoke. Clinician validated her feelings. All of this has increased her depression and anxiety. Clinician asked open ended questions and [name] identified how she would like to engage with these events. She shared some of her negative thoughts, clinician asked if these thoughts got her closer or farther away from her desired outcome. She had the insight that engaging her negative thoughts made her less present and took her farther away from her desire. Client and  clinician reviewed grounding and mindfulness techniques and how to challenge negative thoughts. [name] shared she was glad she came to therapy because it helped her release some of her feeling and focus on what is important to her, her family - mother, husband, mother in law, niece and grandchild  Plan: Return again in 1 weeks  Diagnosis:     Axis I: Major Depressive Disorder, Recurrent Severe, without psychotic features, and Generalized Anxiety Disorder     [name], LCSW 12/19/2016

## 2016-12-25 ENCOUNTER — Encounter (HOSPITAL_COMMUNITY): Payer: Self-pay

## 2016-12-25 ENCOUNTER — Emergency Department (HOSPITAL_COMMUNITY): Payer: PPO

## 2016-12-25 ENCOUNTER — Emergency Department (HOSPITAL_COMMUNITY)
Admission: EM | Admit: 2016-12-25 | Discharge: 2016-12-25 | Disposition: A | Discharge status: Home / Self Care | Payer: PPO | Attending: Emergency Medicine | Admitting: Emergency Medicine

## 2016-12-25 DIAGNOSIS — E039 Hypothyroidism, unspecified: Secondary | ICD-10-CM | POA: Diagnosis not present

## 2016-12-25 DIAGNOSIS — Z7982 Long term (current) use of aspirin: Secondary | ICD-10-CM | POA: Diagnosis not present

## 2016-12-25 DIAGNOSIS — Z96651 Presence of right artificial knee joint: Secondary | ICD-10-CM | POA: Diagnosis not present

## 2016-12-25 DIAGNOSIS — E119 Type 2 diabetes mellitus without complications: Secondary | ICD-10-CM | POA: Insufficient documentation

## 2016-12-25 DIAGNOSIS — J988 Other specified respiratory disorders: Secondary | ICD-10-CM

## 2016-12-25 DIAGNOSIS — J45909 Unspecified asthma, uncomplicated: Secondary | ICD-10-CM | POA: Insufficient documentation

## 2016-12-25 DIAGNOSIS — J069 Acute upper respiratory infection, unspecified: Secondary | ICD-10-CM | POA: Diagnosis not present

## 2016-12-25 DIAGNOSIS — B9789 Other viral agents as the cause of diseases classified elsewhere: Secondary | ICD-10-CM

## 2016-12-25 DIAGNOSIS — Z7984 Long term (current) use of oral hypoglycemic drugs: Secondary | ICD-10-CM | POA: Insufficient documentation

## 2016-12-25 DIAGNOSIS — R05 Cough: Secondary | ICD-10-CM | POA: Diagnosis not present

## 2016-12-25 LAB — INFLUENZA PANEL BY PCR (TYPE A & B)
INFLBPCR: POSITIVE — AB
Influenza A By PCR: NEGATIVE

## 2016-12-25 LAB — RAPID STREP SCREEN (MED CTR MEBANE ONLY): STREPTOCOCCUS, GROUP A SCREEN (DIRECT): NEGATIVE

## 2016-12-25 MED ORDER — IBUPROFEN 200 MG PO TABS
600.0000 mg | ORAL_TABLET | Freq: Once | ORAL | Status: AC
Start: 2016-12-25 — End: 2016-12-25
  Administered 2016-12-25: 600 mg via ORAL
  Filled 2016-12-25: qty 3

## 2016-12-25 NOTE — ED Notes (Signed)
Patient was asking about the wait. Notified lab about the amount of time it would take to complete influenza or rapid strep. Lab reported it would not be long. Informed patient of the update.

## 2016-12-25 NOTE — ED Triage Notes (Signed)
Pt c/o cough and congestion x "a couple days."  Denies pain.  Pt reports taking OTC medications w/o relief.
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
## 2016-12-25 NOTE — Discharge Instructions (Signed)
Read the information below.  You may return to the Emergency Department at any time for worsening condition or any new symptoms that concern you.   If you develop high fevers that do not resolve with tylenol or ibuprofen, you have difficulty swallowing or breathing, or you are unable to tolerate fluids by mouth, return to the ER for a recheck.    °

## 2016-12-28 LAB — CULTURE, GROUP A STREP (THRC)

## 2017-01-18 ENCOUNTER — Ambulatory Visit (INDEPENDENT_AMBULATORY_CARE_PROVIDER_SITE_OTHER): Payer: PPO | Admitting: Clinical

## 2017-01-18 ENCOUNTER — Encounter (HOSPITAL_COMMUNITY): Payer: Self-pay | Admitting: Clinical

## 2017-01-18 DIAGNOSIS — F411 Generalized anxiety disorder: Secondary | ICD-10-CM

## 2017-01-18 DIAGNOSIS — F332 Major depressive disorder, recurrent severe without psychotic features: Secondary | ICD-10-CM | POA: Diagnosis not present

## 2017-01-18 NOTE — Progress Notes (Signed)
   THERAPIST PROGRESS NOTE  Session Time: 9:06 - 9:55  Participation Level: Active  Behavioral Response: CasualAlertDepressed   Type of Therapy: Individual Therapy  Treatment Goals addressed: Improve Psychiatric Symptoms,  Improve Unhelpful Thought Patterns, decrease anxiety,  healthy coping skills   Interventions: Motivational Interviewing, CBT,   Summary: [name] is a 51  y.o. female who presents with Major Depressive Disorder, Recurrent Severe, without psychotic features, and Generalized Anxiety Disorder  Suicidal/Homicidal: No -without intent/plan  Therapist Response: [name] met with clinician for an individual session. [name] discussed her psychiatric symptoms and her current life events. [name] shaved her hear in solidarity with her mother - who is currently being treated for cancer. [name] shared that she has been coping fairly well, but ofcourse is experiencing a lot of depression. Clinician asked open ended questions and [name] shared that her husband quit his job to take care of his mother ( she had been doing it) so that she could take care of her mother . They are staying in separate houses. 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 Client and clinician discussed additional skills for decreasing anxiety. Client and clinician reviewed and discussed how to challenge and change negative thought patterns.  Clinician informed [name] about clinician's upcoming departure from [facility]. Clinician asked open ended questions and [name] shared her thoughts and emotions. [name] and clinician discussed her options for continuing therapy with another clinician.  Plan: Return again in 1-2 weeks  Diagnosis:     Axis I: Major Depressive Disorder, Recurrent Severe, without psychotic features, and Generalized Anxiety Disorder    [name] A, LCSW 01/18/2017

## 2017-02-08 DIAGNOSIS — M1990 Unspecified osteoarthritis, unspecified site: Secondary | ICD-10-CM | POA: Diagnosis not present

## 2017-02-08 DIAGNOSIS — F3340 Major depressive disorder, recurrent, in remission, unspecified: Secondary | ICD-10-CM | POA: Diagnosis not present

## 2017-02-08 DIAGNOSIS — M62830 Muscle spasm of back: Secondary | ICD-10-CM | POA: Diagnosis not present

## 2017-02-08 DIAGNOSIS — R609 Edema, unspecified: Secondary | ICD-10-CM | POA: Diagnosis not present

## 2017-02-08 DIAGNOSIS — E039 Hypothyroidism, unspecified: Secondary | ICD-10-CM | POA: Diagnosis not present

## 2017-02-08 DIAGNOSIS — N3281 Overactive bladder: Secondary | ICD-10-CM | POA: Diagnosis not present

## 2017-02-08 DIAGNOSIS — E7800 Pure hypercholesterolemia, unspecified: Secondary | ICD-10-CM | POA: Diagnosis not present

## 2017-02-08 DIAGNOSIS — E1165 Type 2 diabetes mellitus with hyperglycemia: Secondary | ICD-10-CM | POA: Diagnosis not present

## 2017-02-08 DIAGNOSIS — G4700 Insomnia, unspecified: Secondary | ICD-10-CM | POA: Diagnosis not present

## 2017-02-08 DIAGNOSIS — Z0000 Encounter for general adult medical examination without abnormal findings: Secondary | ICD-10-CM | POA: Diagnosis not present

## 2017-02-08 DIAGNOSIS — I10 Essential (primary) hypertension: Secondary | ICD-10-CM | POA: Diagnosis not present

## 2017-02-10 DIAGNOSIS — M7541 Impingement syndrome of right shoulder: Secondary | ICD-10-CM | POA: Diagnosis not present

## 2017-02-15 ENCOUNTER — Ambulatory Visit (INDEPENDENT_AMBULATORY_CARE_PROVIDER_SITE_OTHER): Payer: PPO | Admitting: Psychiatry

## 2017-02-15 ENCOUNTER — Encounter (HOSPITAL_COMMUNITY): Payer: Self-pay | Admitting: Psychiatry

## 2017-02-15 DIAGNOSIS — F5105 Insomnia due to other mental disorder: Secondary | ICD-10-CM

## 2017-02-15 DIAGNOSIS — F411 Generalized anxiety disorder: Secondary | ICD-10-CM | POA: Diagnosis not present

## 2017-02-15 DIAGNOSIS — F332 Major depressive disorder, recurrent severe without psychotic features: Secondary | ICD-10-CM | POA: Diagnosis not present

## 2017-02-15 DIAGNOSIS — F99 Mental disorder, not otherwise specified: Secondary | ICD-10-CM

## 2017-02-15 MED ORDER — PAROXETINE HCL 40 MG PO TABS: 60.0000 mg | ORAL_TABLET | Freq: Every day | ORAL | 3 refills | Status: DC

## 2017-02-15 MED ORDER — TRAZODONE HCL 100 MG PO TABS: 150.0000 mg | ORAL_TABLET | Freq: Every evening | ORAL | 3 refills | Status: DC | PRN

## 2017-02-15 MED ORDER — MIRTAZAPINE 45 MG PO TABS: 45.0000 mg | ORAL_TABLET | Freq: Every day | ORAL | 2 refills | Status: DC
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
## 2017-03-14 ENCOUNTER — Ambulatory Visit (INDEPENDENT_AMBULATORY_CARE_PROVIDER_SITE_OTHER): Payer: PPO | Admitting: Clinical

## 2017-03-14 ENCOUNTER — Encounter (HOSPITAL_COMMUNITY): Payer: Self-pay | Admitting: Clinical

## 2017-03-14 DIAGNOSIS — F411 Generalized anxiety disorder: Secondary | ICD-10-CM

## 2017-03-14 DIAGNOSIS — Z6841 Body Mass Index (BMI) 40.0 and over, adult: Secondary | ICD-10-CM | POA: Diagnosis not present

## 2017-03-14 DIAGNOSIS — Z01419 Encounter for gynecological examination (general) (routine) without abnormal findings: Secondary | ICD-10-CM | POA: Diagnosis not present

## 2017-03-14 DIAGNOSIS — F331 Major depressive disorder, recurrent, moderate: Secondary | ICD-10-CM

## 2017-03-14 DIAGNOSIS — N3281 Overactive bladder: Secondary | ICD-10-CM | POA: Diagnosis not present

## 2017-03-14 NOTE — Progress Notes (Signed)
   THERAPIST PROGRESS NOTE  Session Time: 8:05-9:05  Participation Level: Active  Behavioral Response: CasualAlertNA  Type of Therapy: Individual Therapy  Treatment Goals addressed: Improve Psychiatric Symptoms, Elevate Mood, Improve Unhelpful Thought Patterns, decrease anxiety, learn about diagnosis, healthy coping skills   Interventions: Motivational Interviewing, CBT, Grounding & Mindfulness Techniques  Summary: Tyauna Lacaze is a 51  y.o. female who presents with Major Depressive Disorder, Recurrent Severe, without psychotic features, and Generalized Anxiety Disorder  Suicidal/Homicidal: No -without intent/plan  Therapist Response: [name] met with clinician for an individual session. Meyli discussed her psychiatric symptoms and her current life events. Simone shared that her mother is dying and she has become involved with Hospice. Camani reports she continues to have anger and frustration with her brother-in-law due to his lack of support with Reshonda's mother in law. Charmagne engaged well in discussion of her own grief process and preparation for her mother's death. Jenevie also identified appropriate coping skills. Clinician reviewed and updated treatment goals. Riyanna reported ongoing progress on all goals. Antonisha shared new hobby of Hendrix painting, which has been enjoyable and therapeutic.   Plan: Return again in 2 weeks  Diagnosis:     Axis I: Major Depressive Disorder, Recurrent Severe, without psychotic features, and Generalized Anxiety Disorder    Carlus Pavlov, LCSW 03/14/17

## 2017-03-28 ENCOUNTER — Ambulatory Visit (HOSPITAL_COMMUNITY): Payer: Self-pay | Admitting: Licensed Clinical Social Worker

## 2017-03-28 ENCOUNTER — Ambulatory Visit (HOSPITAL_COMMUNITY): Payer: Self-pay | Admitting: Clinical

## 2017-04-19 ENCOUNTER — Ambulatory Visit (INDEPENDENT_AMBULATORY_CARE_PROVIDER_SITE_OTHER): Payer: PPO | Admitting: Psychiatry

## 2017-04-19 ENCOUNTER — Encounter (HOSPITAL_COMMUNITY): Payer: Self-pay | Admitting: Psychiatry

## 2017-04-19 DIAGNOSIS — F99 Mental disorder, not otherwise specified: Secondary | ICD-10-CM

## 2017-04-19 DIAGNOSIS — Z79899 Other long term (current) drug therapy: Secondary | ICD-10-CM | POA: Diagnosis not present

## 2017-04-19 DIAGNOSIS — Z634 Disappearance and death of family member: Secondary | ICD-10-CM

## 2017-04-19 DIAGNOSIS — Z818 Family history of other mental and behavioral disorders: Secondary | ICD-10-CM

## 2017-04-19 DIAGNOSIS — F411 Generalized anxiety disorder: Secondary | ICD-10-CM | POA: Diagnosis not present

## 2017-04-19 DIAGNOSIS — F332 Major depressive disorder, recurrent severe without psychotic features: Secondary | ICD-10-CM

## 2017-04-19 DIAGNOSIS — F5105 Insomnia due to other mental disorder: Secondary | ICD-10-CM | POA: Diagnosis not present

## 2017-04-19 MED ORDER — TRAZODONE HCL 100 MG PO TABS: 150.0000 mg | ORAL_TABLET | Freq: Every evening | ORAL | 1 refills | Status: DC | PRN

## 2017-04-19 MED ORDER — MIRTAZAPINE 45 MG PO TABS: 45.0000 mg | ORAL_TABLET | Freq: Every day | ORAL | 1 refills | Status: DC

## 2017-04-19 MED ORDER — DULOXETINE HCL 60 MG PO CPEP: 60.0000 mg | ORAL_CAPSULE | Freq: Every day | ORAL | 1 refills | Status: DC
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
## 2017-05-15 ENCOUNTER — Ambulatory Visit (HOSPITAL_COMMUNITY): Payer: Self-pay | Admitting: Licensed Clinical Social Worker

## 2017-05-21 ENCOUNTER — Ambulatory Visit (HOSPITAL_COMMUNITY): Payer: Self-pay | Admitting: Licensed Clinical Social Worker

## 2017-05-28 ENCOUNTER — Encounter (HOSPITAL_COMMUNITY): Payer: Self-pay | Admitting: Licensed Clinical Social Worker

## 2017-05-28 ENCOUNTER — Ambulatory Visit (HOSPITAL_COMMUNITY): Payer: PPO | Admitting: Licensed Clinical Social Worker

## 2017-05-28 DIAGNOSIS — F4321 Adjustment disorder with depressed mood: Secondary | ICD-10-CM | POA: Diagnosis not present

## 2017-05-28 DIAGNOSIS — F332 Major depressive disorder, recurrent severe without psychotic features: Secondary | ICD-10-CM | POA: Diagnosis not present

## 2017-05-28 NOTE — Progress Notes (Signed)
   THERAPIST PROGRESS NOTE  Session Time: 3:30pm-4:30pm  Participation Level: Active  Behavioral Response: CasualAlertDepressed and Hopeless  Type of Therapy: Individual Therapy  Treatment Goals addressed: Improve Psychiatric Symptoms, Elevate Mood, Improve Unhelpful Thought Patterns, decrease anxiety, learn about diagnosis, healthy coping skills   Interventions: Motivational Interviewing, CBT, Grounding & Mindfulness Techniques  Summary: Joselynn Amoroso is a 51  y.o. female who presents with Major Depressive Disorder, Recurrent Severe, without psychotic features, and Generalized Anxiety Disorder  Suicidal/Homicidal: No -without intent/plan  Therapist Response: [name] met with clinician for an individual session. Jonasia discussed her psychiatric symptoms and her current life events. Yuliya shared that her mother died in 04-08-2023 and she has felt increasing depression and grief. Emmamarie cried throughout the entire session. Saraann reports she has no reason to live and she prays that God will let her die in her sleep. Davyn denies active suicidality. However, she reports she "doesn't want to be here" and she doesn't feel like this is a "normal" grief response. Clinician provided supportive counseling and grounding. Clinician attempted to offer happier thoughts to [place], but this increased her crying. Khadeejah reports she does not want additional support from Hospice. She also reports her medication is not working. She will see Dr. Doyne Keel on 05/31/17.    Plan: Return again in 1 weeks  Diagnosis:     Axis I: Major Depressive Disorder, Recurrent Severe, without psychotic features, and Generalized Anxiety Disorder                              [name] 05/28/2017

## 2017-05-31 ENCOUNTER — Encounter (HOSPITAL_COMMUNITY): Payer: Self-pay | Admitting: Psychiatry

## 2017-05-31 ENCOUNTER — Ambulatory Visit (INDEPENDENT_AMBULATORY_CARE_PROVIDER_SITE_OTHER): Payer: PPO | Admitting: Psychiatry

## 2017-05-31 VITALS — BP 128/74 | HR 90 | Ht 63.0 in | Wt 283.5 lb

## 2017-05-31 DIAGNOSIS — Z634 Disappearance and death of family member: Secondary | ICD-10-CM | POA: Diagnosis not present

## 2017-05-31 DIAGNOSIS — M2550 Pain in unspecified joint: Secondary | ICD-10-CM | POA: Diagnosis not present

## 2017-05-31 DIAGNOSIS — F4329 Adjustment disorder with other symptoms: Secondary | ICD-10-CM | POA: Diagnosis not present

## 2017-05-31 DIAGNOSIS — F332 Major depressive disorder, recurrent severe without psychotic features: Secondary | ICD-10-CM

## 2017-05-31 DIAGNOSIS — F411 Generalized anxiety disorder: Secondary | ICD-10-CM

## 2017-05-31 DIAGNOSIS — G4700 Insomnia, unspecified: Secondary | ICD-10-CM

## 2017-05-31 DIAGNOSIS — Z818 Family history of other mental and behavioral disorders: Secondary | ICD-10-CM | POA: Diagnosis not present

## 2017-05-31 DIAGNOSIS — F4321 Adjustment disorder with depressed mood: Secondary | ICD-10-CM

## 2017-05-31 MED ORDER — MIRTAZAPINE 45 MG PO TABS: 45.0000 mg | ORAL_TABLET | Freq: Every day | ORAL | 1 refills | Status: DC

## 2017-05-31 MED ORDER — ESZOPICLONE 3 MG PO TABS: 3.0000 mg | ORAL_TABLET | Freq: Every evening | ORAL | 1 refills | Status: DC | PRN

## 2017-05-31 MED ORDER — DULOXETINE HCL 60 MG PO CPEP: 60.0000 mg | ORAL_CAPSULE | Freq: Two times a day (BID) | ORAL | 1 refills | Status: DC

## 2017-05-31 MED ORDER — LORAZEPAM 1 MG PO TABS: 1.0000 mg | ORAL_TABLET | Freq: Two times a day (BID) | ORAL | 1 refills | Status: DC | PRN
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
## 2017-06-04 ENCOUNTER — Ambulatory Visit (HOSPITAL_COMMUNITY): Payer: Self-pay | Admitting: Licensed Clinical Social Worker

## 2017-06-05 ENCOUNTER — Telehealth (HOSPITAL_COMMUNITY): Payer: Self-pay

## 2017-06-05 NOTE — Telephone Encounter (Signed)
Medication management - Telephone call with pt after she left a message wanting to verify what medication Dr.Agarwal stopped at last evaluation.  Reviewed patient's record and note from Dr. Doyne Keel 05/31/17 and informed patient she discontinued patient's Trazodone that date and was to start a trial of Lunesta.  Patient stated this is what she thought and just wanted to make sure.

## 2017-06-11 ENCOUNTER — Encounter (HOSPITAL_COMMUNITY): Payer: Self-pay | Admitting: Licensed Clinical Social Worker

## 2017-06-11 ENCOUNTER — Ambulatory Visit (HOSPITAL_COMMUNITY): Payer: PPO | Admitting: Licensed Clinical Social Worker

## 2017-06-11 DIAGNOSIS — Z631 Problems in relationship with in-laws: Secondary | ICD-10-CM | POA: Diagnosis not present

## 2017-06-11 DIAGNOSIS — F332 Major depressive disorder, recurrent severe without psychotic features: Secondary | ICD-10-CM | POA: Diagnosis not present

## 2017-06-11 DIAGNOSIS — F411 Generalized anxiety disorder: Secondary | ICD-10-CM
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
## 2017-06-20 ENCOUNTER — Ambulatory Visit (HOSPITAL_COMMUNITY): Payer: Self-pay | Admitting: Licensed Clinical Social Worker

## 2017-07-11 DIAGNOSIS — J209 Acute bronchitis, unspecified: Secondary | ICD-10-CM | POA: Diagnosis not present

## 2017-07-30 ENCOUNTER — Ambulatory Visit (INDEPENDENT_AMBULATORY_CARE_PROVIDER_SITE_OTHER): Payer: Medicare HMO | Admitting: Licensed Clinical Social Worker

## 2017-07-30 ENCOUNTER — Encounter (HOSPITAL_COMMUNITY): Payer: Self-pay | Admitting: Licensed Clinical Social Worker

## 2017-07-30 DIAGNOSIS — F330 Major depressive disorder, recurrent, mild: Secondary | ICD-10-CM | POA: Diagnosis not present

## 2017-07-30 DIAGNOSIS — F411 Generalized anxiety disorder: Secondary | ICD-10-CM

## 2017-07-30 DIAGNOSIS — R69 Illness, unspecified: Secondary | ICD-10-CM | POA: Diagnosis not present

## 2017-07-30 NOTE — Progress Notes (Signed)
   THERAPIST PROGRESS NOTE  Session Time: 2:30pm-3:30pm  Participation Level: Active  Behavioral Response: NeatAlertEuthymic  Type of Therapy: Individual Therapy  Treatment Goals addressed: Improve Psychiatric Symptoms, Elevate Mood, Improve Unhelpful Thought Patterns, decrease anxiety, learn about diagnosis, healthy coping skills   Interventions: Motivational Interviewing, CBT, Grounding & Mindfulness Techniques  Summary: [name] is a 52  y.o. female who presents with Major Depressive Disorder, Recurrent Severe, without psychotic features, and Generalized Anxiety Disorder  Suicidal/Homicidal: No -without intent/plan  Therapist Response: [name] met with clinician for an individual session. [name] discussed her psychiatric symptoms and her current life events. [name] shared that her holidays were complete chaos, but now that they are over, she is feeling better. [name] reports she had some time away from her mother-in-law, which has been good for her mood and her relationship with her husband. She also reports progress is being made on her home improvement activities and she should have everything completed in the next few weeks. [name] reports she had hard days over Thanksgiving, Christmas, and birthdays, but now things seem to be settling down.   Plan: Return again in 2-3 weeks  Diagnosis:     Axis I: Major Depressive Disorder, Recurrent Severe, without psychotic features, and Generalized Anxiety Disorder                               [name], LCSW 07/30/2017

## 2017-08-02 ENCOUNTER — Ambulatory Visit (INDEPENDENT_AMBULATORY_CARE_PROVIDER_SITE_OTHER): Payer: Medicare HMO | Admitting: Psychiatry

## 2017-08-02 ENCOUNTER — Encounter (HOSPITAL_COMMUNITY): Payer: Self-pay | Admitting: Psychiatry

## 2017-08-02 VITALS — BP 145/89 | HR 87 | Ht 63.0 in | Wt 288.8 lb

## 2017-08-02 DIAGNOSIS — G4700 Insomnia, unspecified: Secondary | ICD-10-CM

## 2017-08-02 DIAGNOSIS — Z818 Family history of other mental and behavioral disorders: Secondary | ICD-10-CM | POA: Diagnosis not present

## 2017-08-02 DIAGNOSIS — F99 Mental disorder, not otherwise specified: Secondary | ICD-10-CM | POA: Diagnosis not present

## 2017-08-02 DIAGNOSIS — F411 Generalized anxiety disorder: Secondary | ICD-10-CM | POA: Diagnosis not present

## 2017-08-02 DIAGNOSIS — F332 Major depressive disorder, recurrent severe without psychotic features: Secondary | ICD-10-CM | POA: Diagnosis not present

## 2017-08-02 DIAGNOSIS — R69 Illness, unspecified: Secondary | ICD-10-CM | POA: Diagnosis not present

## 2017-08-02 DIAGNOSIS — F5105 Insomnia due to other mental disorder: Secondary | ICD-10-CM

## 2017-08-02 MED ORDER — LORAZEPAM 1 MG PO TABS: 1.0000 mg | ORAL_TABLET | Freq: Two times a day (BID) | ORAL | 1 refills | Status: DC | PRN

## 2017-08-02 MED ORDER — MIRTAZAPINE 30 MG PO TABS: 60.0000 mg | ORAL_TABLET | Freq: Every day | ORAL | 1 refills | Status: DC

## 2017-08-02 MED ORDER — ESZOPICLONE 3 MG PO TABS: 3.0000 mg | ORAL_TABLET | Freq: Every evening | ORAL | 1 refills | Status: DC | PRN

## 2017-08-02 MED ORDER — DULOXETINE HCL 60 MG PO CPEP: 60.0000 mg | ORAL_CAPSULE | Freq: Two times a day (BID) | ORAL | 1 refills | Status: DC
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
## 2017-08-09 DIAGNOSIS — R609 Edema, unspecified: Secondary | ICD-10-CM | POA: Diagnosis not present

## 2017-08-09 DIAGNOSIS — M62830 Muscle spasm of back: Secondary | ICD-10-CM | POA: Diagnosis not present

## 2017-08-09 DIAGNOSIS — G4700 Insomnia, unspecified: Secondary | ICD-10-CM | POA: Diagnosis not present

## 2017-08-09 DIAGNOSIS — M1990 Unspecified osteoarthritis, unspecified site: Secondary | ICD-10-CM | POA: Diagnosis not present

## 2017-08-09 DIAGNOSIS — E1165 Type 2 diabetes mellitus with hyperglycemia: Secondary | ICD-10-CM | POA: Diagnosis not present

## 2017-08-09 DIAGNOSIS — E039 Hypothyroidism, unspecified: Secondary | ICD-10-CM | POA: Diagnosis not present

## 2017-08-09 DIAGNOSIS — R69 Illness, unspecified: Secondary | ICD-10-CM | POA: Diagnosis not present

## 2017-08-09 DIAGNOSIS — E7800 Pure hypercholesterolemia, unspecified: Secondary | ICD-10-CM | POA: Diagnosis not present

## 2017-08-09 DIAGNOSIS — N3281 Overactive bladder: Secondary | ICD-10-CM | POA: Diagnosis not present

## 2017-08-09 DIAGNOSIS — I10 Essential (primary) hypertension: Secondary | ICD-10-CM | POA: Diagnosis not present

## 2017-08-10 DIAGNOSIS — Z961 Presence of intraocular lens: Secondary | ICD-10-CM | POA: Diagnosis not present

## 2017-08-10 DIAGNOSIS — Z7984 Long term (current) use of oral hypoglycemic drugs: Secondary | ICD-10-CM | POA: Diagnosis not present

## 2017-08-10 DIAGNOSIS — H40013 Open angle with borderline findings, low risk, bilateral: Secondary | ICD-10-CM | POA: Diagnosis not present

## 2017-08-10 DIAGNOSIS — H11153 Pinguecula, bilateral: Secondary | ICD-10-CM | POA: Diagnosis not present

## 2017-08-10 DIAGNOSIS — H18413 Arcus senilis, bilateral: Secondary | ICD-10-CM | POA: Diagnosis not present

## 2017-08-10 DIAGNOSIS — H04123 Dry eye syndrome of bilateral lacrimal glands: Secondary | ICD-10-CM | POA: Diagnosis not present

## 2017-08-10 DIAGNOSIS — H40003 Preglaucoma, unspecified, bilateral: Secondary | ICD-10-CM | POA: Diagnosis not present

## 2017-08-10 DIAGNOSIS — H11423 Conjunctival edema, bilateral: Secondary | ICD-10-CM | POA: Diagnosis not present

## 2017-08-10 DIAGNOSIS — E119 Type 2 diabetes mellitus without complications: Secondary | ICD-10-CM | POA: Diagnosis not present

## 2017-08-10 DIAGNOSIS — H3589 Other specified retinal disorders: Secondary | ICD-10-CM | POA: Diagnosis not present

## 2017-08-13 ENCOUNTER — Ambulatory Visit (INDEPENDENT_AMBULATORY_CARE_PROVIDER_SITE_OTHER): Payer: Medicare HMO | Admitting: Licensed Clinical Social Worker

## 2017-08-13 ENCOUNTER — Encounter (HOSPITAL_COMMUNITY): Payer: Self-pay | Admitting: Licensed Clinical Social Worker

## 2017-08-13 DIAGNOSIS — F330 Major depressive disorder, recurrent, mild: Secondary | ICD-10-CM | POA: Diagnosis not present

## 2017-08-13 DIAGNOSIS — R69 Illness, unspecified: Secondary | ICD-10-CM | POA: Diagnosis not present

## 2017-08-13 DIAGNOSIS — F411 Generalized anxiety disorder: Secondary | ICD-10-CM

## 2017-08-13 NOTE — Progress Notes (Signed)
   THERAPIST PROGRESS NOTE  Session Time: 2:30pm-3:30pm  Participation Level: Active  Behavioral Response: Well GroomedAlertEuthymic   Type of Therapy: Individual Therapy  Treatment Goals addressed: Improve Psychiatric Symptoms, Elevate Mood, Improve Unhelpful Thought Patterns, decrease anxiety, learn about diagnosis, healthy coping skills   Interventions: Motivational Interviewing, CBT, Grounding & Mindfulness Techniques  Summary: [name] is a 52  y.o. female who presents with Major Depressive Disorder, Recurrent mild, without psychotic features, and Generalized Anxiety Disorder  Suicidal/Homicidal: No -without intent/plan  Therapist Response: [name] met with clinician for an individual session. [name] discussed her psychiatric symptoms and her current life events. [name] shared that she has been feeling pretty good over the past few days. [name] reports she had a break from her mother in law, which has really improved her overall attitude. [name] also reports most of the construction on her back deck has been completed and she feels like she has her house back. [name] identified concerns from her husband about the way she treats her mother in law. [name] reports low frustration tolerance for MIL due to dementia, repetitive behaviors, and inability to do things for herself. Clinician provided potential supports in the community, as well as interventions that can be done at home (music, art). [name] discussed using food as coping and comfort when depressed, which has added to weight gain and some frustration about this. However, [name] also reports lack of motivation to exercise. Clinician discussed ways to limit some calories and increase movement.   Plan: Return again in 2 weeks  Diagnosis:     Axis I: Major Depressive Disorder, Recurrent mild, without psychotic features, and Generalized Anxiety Disorder                              [name], LCSW 08/13/2017

## 2017-08-17 DIAGNOSIS — M1812 Unilateral primary osteoarthritis of first carpometacarpal joint, left hand: Secondary | ICD-10-CM | POA: Insufficient documentation

## 2017-08-17 DIAGNOSIS — M13842 Other specified arthritis, left hand: Secondary | ICD-10-CM | POA: Diagnosis not present

## 2017-08-17 DIAGNOSIS — M13841 Other specified arthritis, right hand: Secondary | ICD-10-CM | POA: Diagnosis not present

## 2017-08-17 DIAGNOSIS — M19041 Primary osteoarthritis, right hand: Secondary | ICD-10-CM | POA: Insufficient documentation

## 2017-08-20 ENCOUNTER — Encounter (HOSPITAL_COMMUNITY): Payer: Self-pay | Admitting: Licensed Clinical Social Worker

## 2017-08-20 ENCOUNTER — Ambulatory Visit (INDEPENDENT_AMBULATORY_CARE_PROVIDER_SITE_OTHER): Payer: Medicare HMO | Admitting: Licensed Clinical Social Worker

## 2017-08-20 ENCOUNTER — Ambulatory Visit (HOSPITAL_COMMUNITY): Payer: Self-pay | Admitting: Licensed Clinical Social Worker

## 2017-08-20 DIAGNOSIS — F330 Major depressive disorder, recurrent, mild: Secondary | ICD-10-CM

## 2017-08-20 DIAGNOSIS — R69 Illness, unspecified: Secondary | ICD-10-CM | POA: Diagnosis not present

## 2017-08-20 NOTE — Progress Notes (Signed)
   THERAPIST PROGRESS NOTE  Session Time: 2:30pm-3:15pm  Participation Level: Active  Behavioral Response: NeatAlertIrritable  Type of Therapy: Individual Therapy  Treatment Goals addressed: Improve Psychiatric Symptoms, Elevate Mood, Improve Unhelpful Thought Patterns, decrease anxiety, learn about diagnosis, healthy coping skills   Interventions: Motivational Interviewing, CBT, Grounding & Mindfulness Techniques  Summary: [name] is a 52  y.o. female who presents with Major Depressive Disorder, Recurrent mild, and Generalized Anxiety Disorder  Suicidal/Homicidal: No -without intent/plan  Therapist Response: [name] met with clinician for an individual session. [name] discussed her psychiatric symptoms and her current life events. [name] shared that she continues to struggle with her mother-in-law and that most comments made are enough to make [name] feel crazy. Sherrika reports she has been getting her house together and planning to buy a camper. However, she notes that her husband is dragging his feet. [name] reports she wants to enjoy her time while she is here, because "we aren't guaranteed tomorrow". She also notes that due to her health, her husband's health, there is no telling if they will "make it" to retirement age.    Plan: Return again in 1-2 weeks  Diagnosis:     Axis I: Major Depressive Disorder, Recurrent mild, and Generalized Anxiety Disorder                             [name], LCSW 08/20/2017

## 2017-09-03 ENCOUNTER — Ambulatory Visit (INDEPENDENT_AMBULATORY_CARE_PROVIDER_SITE_OTHER): Payer: Medicare HMO | Admitting: Licensed Clinical Social Worker

## 2017-09-03 ENCOUNTER — Encounter (HOSPITAL_COMMUNITY): Payer: Self-pay | Admitting: Licensed Clinical Social Worker

## 2017-09-03 DIAGNOSIS — F330 Major depressive disorder, recurrent, mild: Secondary | ICD-10-CM

## 2017-09-03 DIAGNOSIS — R69 Illness, unspecified: Secondary | ICD-10-CM | POA: Diagnosis not present

## 2017-09-03 DIAGNOSIS — F411 Generalized anxiety disorder: Secondary | ICD-10-CM | POA: Diagnosis not present

## 2017-09-03 NOTE — Progress Notes (Signed)
   THERAPIST PROGRESS NOTE  Session Time: 2:30pm-3:30pm  Participation Level: Active  Behavioral Response: Well GroomedAlertEuthymic   Type of Therapy: Individual Therapy  Treatment Goals addressed: Improve Psychiatric Symptoms, Elevate Mood, Improve Unhelpful Thought Patterns, decrease anxiety, learn about diagnosis, healthy coping skills   Interventions: Motivational Interviewing, CBT, Grounding & Mindfulness Techniques  Summary: [name] is a 52  y.o. female who presents with Major Depressive Disorder, Recurrent Mild, without psychotic features, and Generalized Anxiety Disorder  Suicidal/Homicidal: No -without intent/plan  Therapist Response: [name] met with clinician for an individual session. [name] discussed her psychiatric symptoms and her current life events. [name] shared that she has felt okay, but not motivated to do much. [name] identified lack of "gumption" to clean, organize, cook, or eat. She reports she has been missing her mother a lot, but she has not cried in 2-3 weeks. Clinician explored what mother would say about the state of the house and [name]'s current lack of motivation. [name] responded that mother would tell her to "get off my a** and clean up". [name] reports mother was her reason for doing anything. [name] also identified a lack of purpose due to not working and only taking care of her mother in [employer]. Clinician discussed small steps for tidying up and to choose individual areas and tasks. Clinician also identified the possibility of enlisting others for help, such as husband and granddaughter.   Plan: Return again in 2 weeks  Diagnosis:     Axis I: Major Depressive Disorder, Recurrent Mild, without psychotic features, and Generalized Anxiety Disorder                             [name], LCSW 09/03/2017

## 2017-09-14 DIAGNOSIS — G5622 Lesion of ulnar nerve, left upper limb: Secondary | ICD-10-CM | POA: Diagnosis not present

## 2017-09-14 DIAGNOSIS — M13841 Other specified arthritis, right hand: Secondary | ICD-10-CM | POA: Diagnosis not present

## 2017-09-14 DIAGNOSIS — M13842 Other specified arthritis, left hand: Secondary | ICD-10-CM | POA: Diagnosis not present

## 2017-09-15 DIAGNOSIS — G5620 Lesion of ulnar nerve, unspecified upper limb: Secondary | ICD-10-CM | POA: Insufficient documentation

## 2017-09-17 DIAGNOSIS — G5622 Lesion of ulnar nerve, left upper limb: Secondary | ICD-10-CM | POA: Diagnosis not present

## 2017-09-19 DIAGNOSIS — M79672 Pain in left foot: Secondary | ICD-10-CM | POA: Diagnosis not present

## 2017-09-25 DIAGNOSIS — G5622 Lesion of ulnar nerve, left upper limb: Secondary | ICD-10-CM | POA: Diagnosis not present

## 2017-09-27 ENCOUNTER — Ambulatory Visit (HOSPITAL_COMMUNITY): Payer: Medicare HMO | Admitting: Psychiatry

## 2017-09-27 DIAGNOSIS — F99 Mental disorder, not otherwise specified: Secondary | ICD-10-CM | POA: Diagnosis not present

## 2017-09-27 DIAGNOSIS — R69 Illness, unspecified: Secondary | ICD-10-CM | POA: Diagnosis not present

## 2017-09-27 DIAGNOSIS — F5105 Insomnia due to other mental disorder: Secondary | ICD-10-CM | POA: Diagnosis not present

## 2017-09-27 DIAGNOSIS — F332 Major depressive disorder, recurrent severe without psychotic features: Secondary | ICD-10-CM

## 2017-09-27 DIAGNOSIS — Z818 Family history of other mental and behavioral disorders: Secondary | ICD-10-CM | POA: Diagnosis not present

## 2017-09-27 DIAGNOSIS — F411 Generalized anxiety disorder: Secondary | ICD-10-CM

## 2017-09-27 DIAGNOSIS — R450 Nervousness: Secondary | ICD-10-CM | POA: Diagnosis not present

## 2017-09-27 MED ORDER — ESZOPICLONE 3 MG PO TABS: 3.0000 mg | ORAL_TABLET | Freq: Every evening | ORAL | 1 refills | Status: DC | PRN

## 2017-09-27 MED ORDER — LORAZEPAM 1 MG PO TABS: 1.0000 mg | ORAL_TABLET | Freq: Two times a day (BID) | ORAL | 1 refills | Status: DC | PRN

## 2017-09-27 MED ORDER — LITHIUM CARBONATE ER 300 MG PO TBCR
300.0000 mg | EXTENDED_RELEASE_TABLET | Freq: Every day | ORAL | 1 refills | Status: DC
Start: 2017-09-27 — End: 2017-11-15

## 2017-09-27 MED ORDER — MIRTAZAPINE 30 MG PO TABS: 60.0000 mg | ORAL_TABLET | Freq: Every day | ORAL | 1 refills | Status: DC

## 2017-09-27 MED ORDER — DULOXETINE HCL 60 MG PO CPEP: 60.0000 mg | ORAL_CAPSULE | Freq: Two times a day (BID) | ORAL | 1 refills | Status: DC
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
## 2017-09-28 DIAGNOSIS — J209 Acute bronchitis, unspecified: Secondary | ICD-10-CM | POA: Diagnosis not present

## 2017-09-28 DIAGNOSIS — E039 Hypothyroidism, unspecified: Secondary | ICD-10-CM | POA: Diagnosis not present

## 2017-10-01 DIAGNOSIS — J0110 Acute frontal sinusitis, unspecified: Secondary | ICD-10-CM | POA: Diagnosis not present

## 2017-10-05 DIAGNOSIS — R351 Nocturia: Secondary | ICD-10-CM | POA: Diagnosis not present

## 2017-10-05 DIAGNOSIS — N3941 Urge incontinence: Secondary | ICD-10-CM | POA: Diagnosis not present

## 2017-10-08 ENCOUNTER — Ambulatory Visit (HOSPITAL_COMMUNITY): Payer: Medicare HMO | Admitting: Licensed Clinical Social Worker

## 2017-10-08 ENCOUNTER — Encounter (HOSPITAL_COMMUNITY): Payer: Self-pay | Admitting: Licensed Clinical Social Worker

## 2017-10-08 DIAGNOSIS — F330 Major depressive disorder, recurrent, mild: Secondary | ICD-10-CM | POA: Diagnosis not present

## 2017-10-08 DIAGNOSIS — R69 Illness, unspecified: Secondary | ICD-10-CM | POA: Diagnosis not present

## 2017-10-08 DIAGNOSIS — F411 Generalized anxiety disorder: Secondary | ICD-10-CM

## 2017-10-08 NOTE — Progress Notes (Signed)
   THERAPIST PROGRESS NOTE  Session Time: 12:45pm-1:30pm  Participation Level: Active  Behavioral Response: Well GroomedAlertEuthymic  Type of Therapy: Individual Therapy  Treatment Goals addressed: Improve Psychiatric Symptoms, Elevate Mood, Improve Unhelpful Thought Patterns, decrease anxiety, learn about diagnosis, healthy coping skills   Interventions: Motivational Interviewing, CBT, Grounding & Mindfulness Techniques  Summary: [name] is a 51  y.o. female who presents with Major Depressive Disorder, Recurrent mild, without psychotic features, and Generalized Anxiety Disorder  Suicidal/Homicidal: No -without intent/plan  Therapist Response: Starkisha met with clinician for an individual session. [name] discussed her psychiatric symptoms and her current life events. [name] shared that she has been feeling much better since starting Lithium. She reports when she was last in with Dr. Agarwal, she had not slept well and was having a very hard time. However, she noted that after several days of Lithium, she has been feeling more motivated to clean the house, she is sleeping better, less irritable, and she has a bit more patience with her mother in law. Clinician explored grief process and provided additional psychoeducation about what to expect from grief. Clinician processed thoughts and feelings about mother in law and provided supportive therapy. Clinician discussed additional ways to work with mother in law and confronted [name] about her expectations for her.  [name] reports she and husband are going to [place] for a week and she is really looking forward to it. However, she also noted concerns about returning. Clinician encouraged [name] to be present in the moment and to recharge her batteries so she can come home and be a happier caregiver. Clinician offered support groups information, but [name] declined at this time.   Plan: Return again in 3-4 weeks  Diagnosis:     Axis I: Major  Depressive Disorder, Recurrent MIld, without psychotic features, and Generalized Anxiety Disorder     R , LCSW 10/08/2017  

## 2017-10-22 ENCOUNTER — Ambulatory Visit (HOSPITAL_COMMUNITY): Payer: Medicare HMO | Admitting: Licensed Clinical Social Worker

## 2017-10-22 DIAGNOSIS — M79672 Pain in left foot: Secondary | ICD-10-CM | POA: Diagnosis not present

## 2017-10-29 DIAGNOSIS — G5622 Lesion of ulnar nerve, left upper limb: Secondary | ICD-10-CM | POA: Diagnosis not present

## 2017-11-05 ENCOUNTER — Ambulatory Visit (HOSPITAL_COMMUNITY): Payer: Medicare HMO | Admitting: Licensed Clinical Social Worker

## 2017-11-05 ENCOUNTER — Encounter (HOSPITAL_COMMUNITY): Payer: Self-pay | Admitting: Licensed Clinical Social Worker

## 2017-11-05 DIAGNOSIS — R69 Illness, unspecified: Secondary | ICD-10-CM | POA: Diagnosis not present

## 2017-11-05 DIAGNOSIS — F4329 Adjustment disorder with other symptoms: Secondary | ICD-10-CM

## 2017-11-05 DIAGNOSIS — F332 Major depressive disorder, recurrent severe without psychotic features: Secondary | ICD-10-CM

## 2017-11-05 DIAGNOSIS — Z634 Disappearance and death of family member: Secondary | ICD-10-CM | POA: Diagnosis not present

## 2017-11-05 DIAGNOSIS — F4321 Adjustment disorder with depressed mood: Secondary | ICD-10-CM

## 2017-11-05 IMAGING — MR MR LUMBAR SPINE W/O CM
4 of 5 series · 25 of 48 positions shown · non-contrast
Comparison: CT abdomen and pelvis 11/04/2009.

CLINICAL DATA: Acute onset severe low back and bilateral leg pain 2
weeks ago. No known injury.

EXAM:
MRI LUMBAR SPINE WITHOUT CONTRAST
TECHNIQUE: Multiplanar, multisequence MR imaging of the lumbar spine was
performed. No intravenous contrast was administered.

[Series 3: T2 · sagittal · 4.0mm · 0.55mm/px · 6 of 13 slices shown (1 of 2)]
[im 1/13]
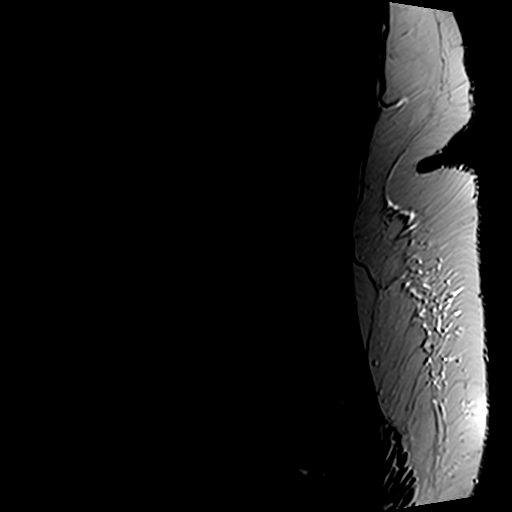
[im 3/13]
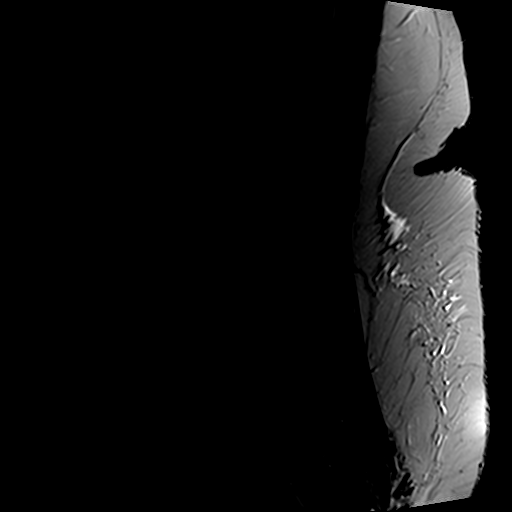
[im 5/13]
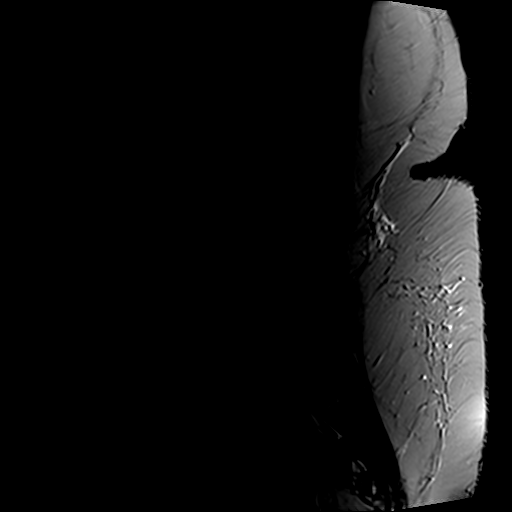
[im 8/13]
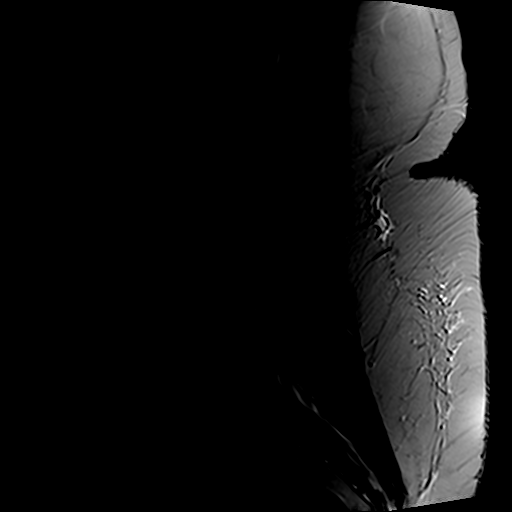
[im 10/13]
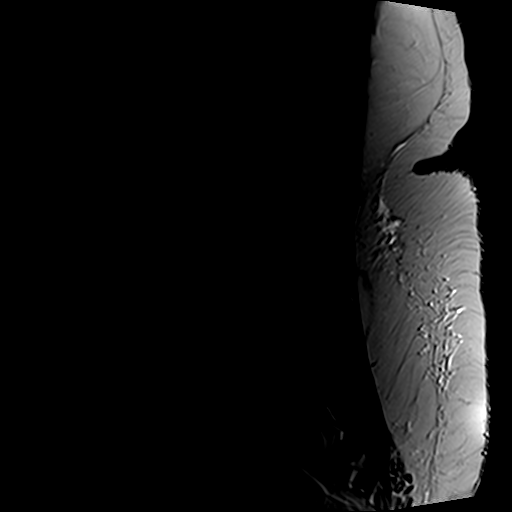
[im 13/13]
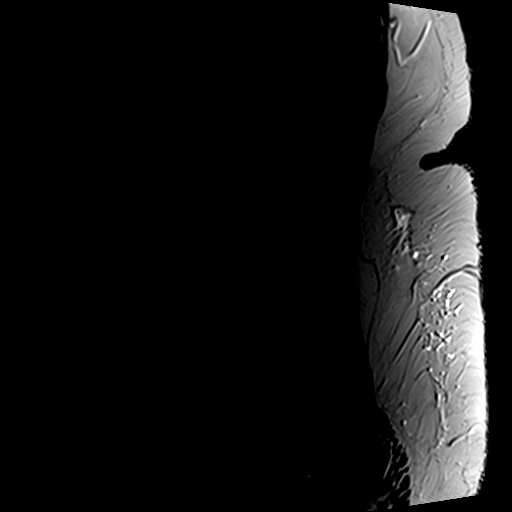

[Series 4: T1 · sagittal · 4.0mm · 0.55mm/px · 6 of 13 slices shown (1 of 2)]
[im 1/13]
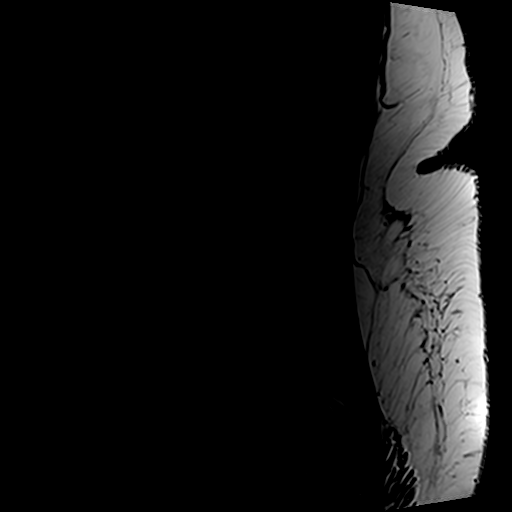
[im 3/13]
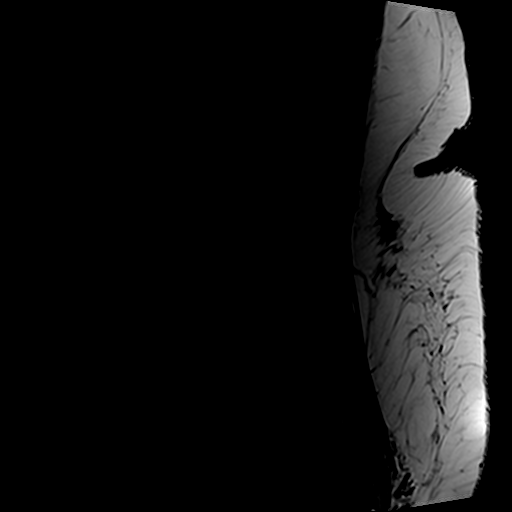
[im 5/13]
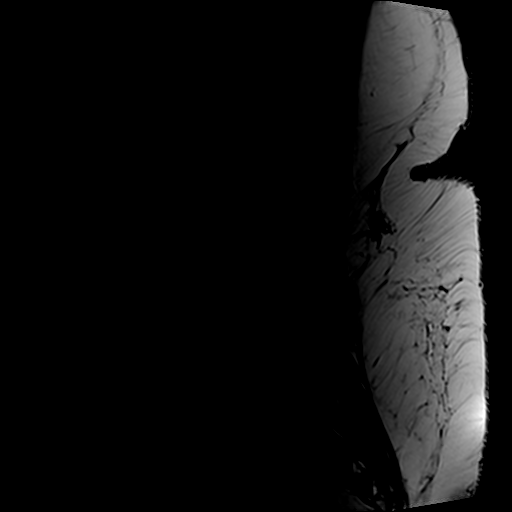
[im 8/13]
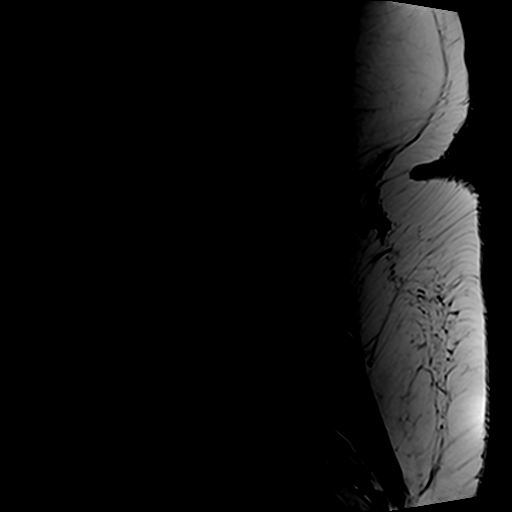
[im 10/13]
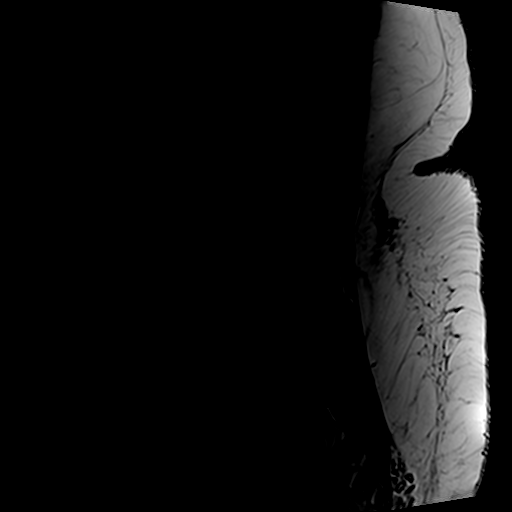
[im 13/13]
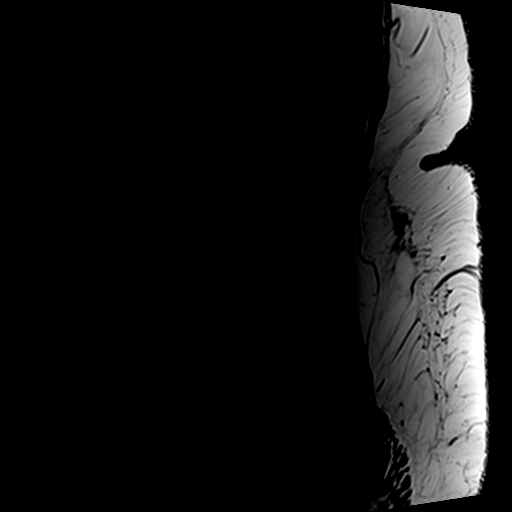

[Series 6: T2 · axial · 4.0mm · 0.78mm/px · z∈[-78,+87]mm · 9 of 32 slices shown (2 of 2)]
[im 1/32]
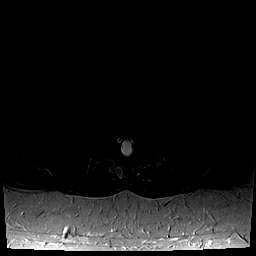
[im 5/32]
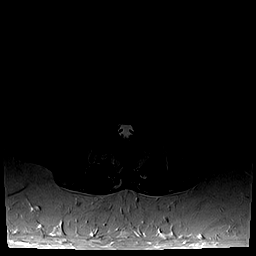
[im 9/32]
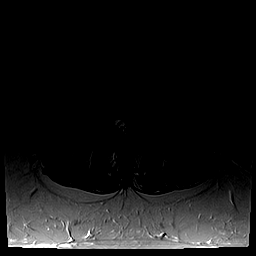
[im 14/32]
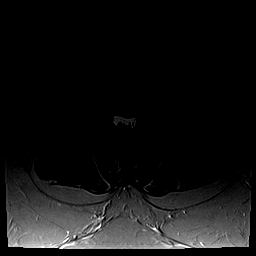
[im 16/32]
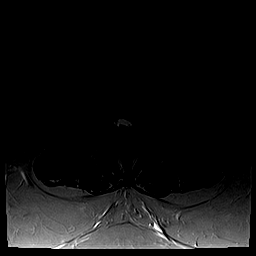
[im 18/32]
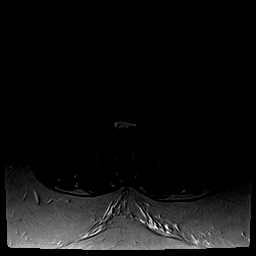
[im 23/32]
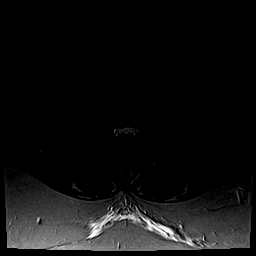
[im 27/32]
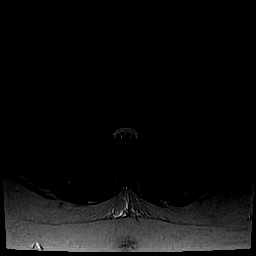
[im 32/32]
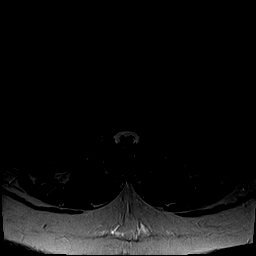

[Series 7: T1 · axial · 4.0mm · 0.39mm/px · z∈[-78,+63]mm · 4 of 32 slices shown (2 of 2)]
[im 1/32]
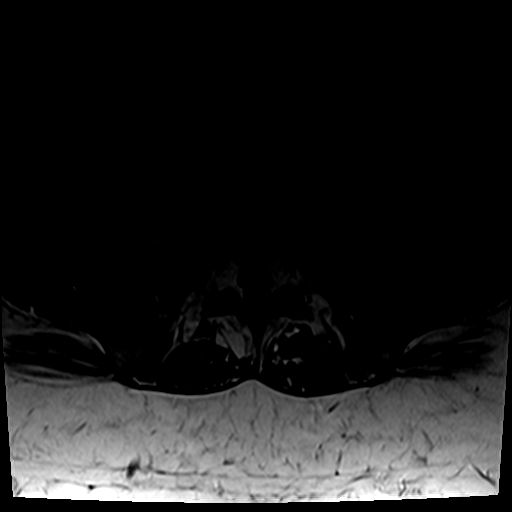
[im 5/32]
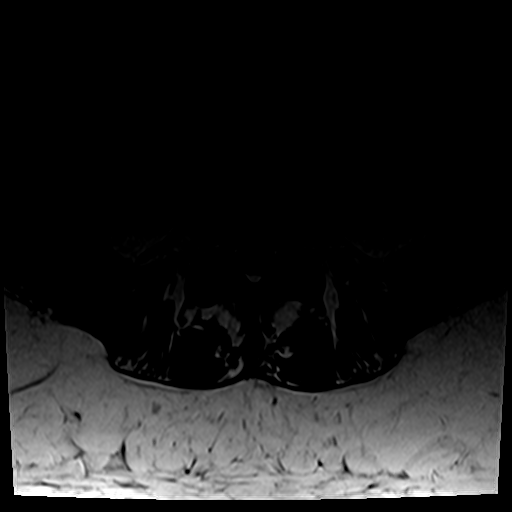
[im 16/32]
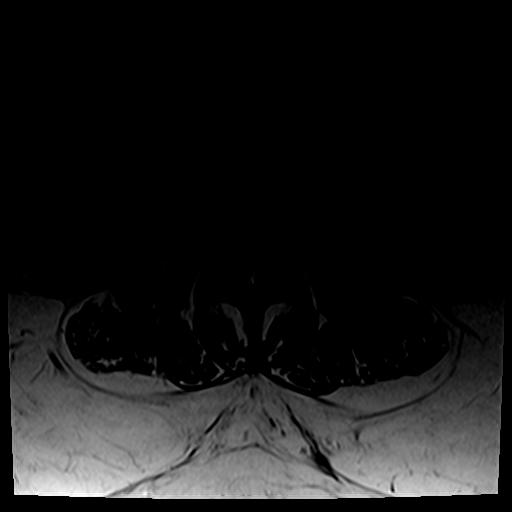
[im 27/32]
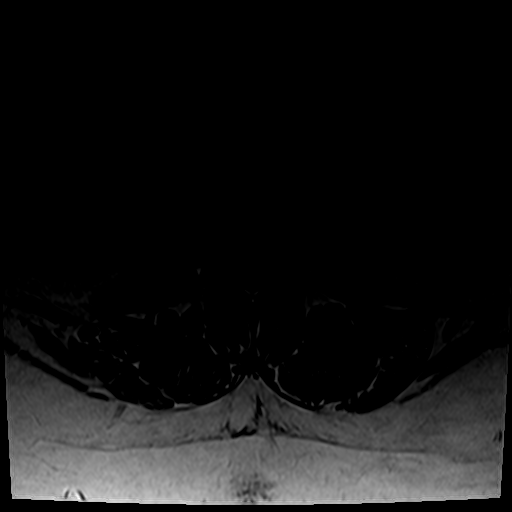

[25 of 48 positions shown; findings below may reference images not displayed]

FINDINGS: Segmentation:  Unremarkable.

Alignment:  Maintained.

Vertebrae:  Vertebral body height and signal are normal.

Conus medullaris: Extends to the mid L2 level and appears normal.

Paraspinal and other soft tissues: Unremarkable.

Disc levels:

T11-12 and T12-L1 are imaged in the sagittal plane only and
negative.

L1-2:  Negative.

L2-3: Very shallow right paracentral protrusion and mild facet
degenerative change. The central spinal canal and neural foramina
are widely patent.

L3-4: There is a shallow left paracentral protrusion causing mild
narrowing in the left lateral recess without nerve root compression
identified. The foramina are open.

L4-5: The patient has a disc bulge with a superimposed large central
and left paracentral protrusion. There is moderately severe
narrowing of the thecal sac and severe narrowing in the left lateral
recess with impingement on the descending left L5 root. Mild
bilateral foraminal narrowing due to disc is identified. Moderate
facet degenerative change is seen.

L5-S1: Shallow disc bulge without central canal or foraminal
stenosis. Moderate facet degenerative change is noted.
IMPRESSION: Spondylosis worst at L4-5 where there is a disc bulge and
superimposed large central and left side disc protrusion with caudal
extension. Moderately severe narrowing of the thecal sac is present
at this level and there is severe narrowing in the left lateral
recess with impingement on the descending left L5 root.

Shallow left paracentral protrusion at L3-4 causes mild narrowing in
the left lateral recess without nerve root compression.

Very small right paracentral protrusion L2-3 without central canal
or foraminal stenosis.

## 2017-11-05 NOTE — Progress Notes (Signed)
   THERAPIST PROGRESS NOTE  Session Time: 12:30pm-1:30pm  Participation Level: Active  Behavioral Response: Well GroomedAlertDepressed  Type of Therapy: Individual Therapy  Treatment Goals addressed: Improve Psychiatric Symptoms, Elevate Mood, Improve Unhelpful Thought Patterns, decrease anxiety, learn about diagnosis, healthy coping skills   Interventions: Motivational Interviewing, CBT, Grounding & Mindfulness Techniques  Summary: [name] is a 52  y.o. female who presents with Major Depressive Disorder, Recurrent moderate and Generalized Anxiety Disorder  Suicidal/Homicidal: No -without intent/plan  Therapist Response: [name] met with clinician for an individual session. Nakari discussed her psychiatric symptoms and her current life events. Yamaira shared that she had a good vacation, but she continued to worry about her mother-in-law, who was in the care of her brother-in-law while they were gone. S[place]ah reports she was able to relax and enjoyed herself with her husband and friends. Yohana reports that since she has been home, she continues to have difficulties with her mother in law, continues to resent mother in law for being alive when her mother has died, and she continues to have fleeting thoughts of her own death. Clinician explored SI, and noted none present. However, Shalan reported wishing it was her and not her mother who had died.  Clinician discussed grief process and noted the importance of taking a year to allow the full process to run its course. Clinician identified that since mother had not yet been buried, it made it even harder to make peace with this loss.  Clinician encouraged Micaela to reach out to Hospice for additional grief support. Okema reported she also may contact [facility] and participate in some of their art projects.   Plan: Return again in 1 weeks  Diagnosis:     Axis I: Major Depressive Disorder, Recurrent Severe, without psychotic features,  and Generalized Anxiety Disorder                             [name], LCSW 11/05/2017

## 2017-11-12 DIAGNOSIS — E039 Hypothyroidism, unspecified: Secondary | ICD-10-CM | POA: Diagnosis not present

## 2017-11-13 DIAGNOSIS — Z5189 Encounter for other specified aftercare: Secondary | ICD-10-CM | POA: Insufficient documentation

## 2017-11-15 ENCOUNTER — Ambulatory Visit (HOSPITAL_COMMUNITY): Payer: Managed Care, Other (non HMO) | Admitting: Psychiatry

## 2017-11-15 DIAGNOSIS — Z636 Dependent relative needing care at home: Secondary | ICD-10-CM | POA: Diagnosis not present

## 2017-11-15 DIAGNOSIS — F411 Generalized anxiety disorder: Secondary | ICD-10-CM

## 2017-11-15 DIAGNOSIS — Z634 Disappearance and death of family member: Secondary | ICD-10-CM

## 2017-11-15 DIAGNOSIS — F332 Major depressive disorder, recurrent severe without psychotic features: Secondary | ICD-10-CM | POA: Diagnosis not present

## 2017-11-15 DIAGNOSIS — Z79899 Other long term (current) drug therapy: Secondary | ICD-10-CM | POA: Diagnosis not present

## 2017-11-15 DIAGNOSIS — R69 Illness, unspecified: Secondary | ICD-10-CM | POA: Diagnosis not present

## 2017-11-15 DIAGNOSIS — R252 Cramp and spasm: Secondary | ICD-10-CM | POA: Diagnosis not present

## 2017-11-15 DIAGNOSIS — F5105 Insomnia due to other mental disorder: Secondary | ICD-10-CM | POA: Diagnosis not present

## 2017-11-15 DIAGNOSIS — F99 Mental disorder, not otherwise specified: Secondary | ICD-10-CM | POA: Diagnosis not present

## 2017-11-15 DIAGNOSIS — Z818 Family history of other mental and behavioral disorders: Secondary | ICD-10-CM | POA: Diagnosis not present

## 2017-11-15 MED ORDER — ESZOPICLONE 3 MG PO TABS: 3.0000 mg | ORAL_TABLET | Freq: Every evening | ORAL | 1 refills | Status: DC | PRN

## 2017-11-15 MED ORDER — LITHIUM CARBONATE ER 450 MG PO TBCR: 450.0000 mg | EXTENDED_RELEASE_TABLET | Freq: Every day | ORAL | 1 refills | Status: DC

## 2017-11-15 MED ORDER — DULOXETINE HCL 60 MG PO CPEP: 60.0000 mg | ORAL_CAPSULE | Freq: Two times a day (BID) | ORAL | 1 refills | Status: DC

## 2017-11-15 MED ORDER — LORAZEPAM 1 MG PO TABS: 1.0000 mg | ORAL_TABLET | Freq: Two times a day (BID) | ORAL | 1 refills | Status: DC | PRN

## 2017-11-15 MED ORDER — MIRTAZAPINE 30 MG PO TABS: 60.0000 mg | ORAL_TABLET | Freq: Every day | ORAL | 1 refills | Status: DC
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
## 2017-11-20 ENCOUNTER — Encounter (HOSPITAL_COMMUNITY): Payer: Self-pay | Admitting: Licensed Clinical Social Worker

## 2017-11-20 ENCOUNTER — Ambulatory Visit (HOSPITAL_COMMUNITY): Payer: Managed Care, Other (non HMO) | Admitting: Licensed Clinical Social Worker

## 2017-11-20 DIAGNOSIS — F4329 Adjustment disorder with other symptoms: Secondary | ICD-10-CM | POA: Diagnosis not present

## 2017-11-20 DIAGNOSIS — R69 Illness, unspecified: Secondary | ICD-10-CM | POA: Diagnosis not present

## 2017-11-20 DIAGNOSIS — Z634 Disappearance and death of family member: Secondary | ICD-10-CM | POA: Diagnosis not present

## 2017-11-20 DIAGNOSIS — F4321 Adjustment disorder with depressed mood: Secondary | ICD-10-CM

## 2017-11-20 DIAGNOSIS — F331 Major depressive disorder, recurrent, moderate: Secondary | ICD-10-CM | POA: Diagnosis not present

## 2017-11-20 NOTE — Progress Notes (Signed)
   THERAPIST PROGRESS NOTE  Session Time: 12:30pm-1:30pm  Participation Level: Active  Behavioral Response: Well GroomedAlertDepressed  Type of Therapy: Individual Therapy  Treatment Goals addressed: Improve Psychiatric Symptoms, Elevate Mood, Improve Unhelpful Thought Patterns, decrease anxiety, learn about diagnosis, healthy coping skills   Interventions: Motivational Interviewing, CBT, Grounding & Mindfulness Techniques  Summary: Saher Davee is a 52  y.o. female who presents with Major Depressive Disorder, Recurrent moderate  and Complicated bereavement  Suicidal/Homicidal: No -without intent/plan  Therapist Response: Faustine met with clinician for an individual session. Tache discussed her psychiatric symptoms and her current life events. Bobbijo shared that she "hadn't been doing so well" because Mother's Day is coming up and she's still grieving the loss of her mother.  She shared that her doctor had "upped her meds" and that it was helping her to "deal with her mother-in-law" and that her brother-in-law would be picking her mother-in-law up on Mother's Day and keeping her for a week.  Used MI OARS to address Ramsey's concerns about the time it was taking to get over the loss of her mother; process her feelings about losing her mother; discuss coping mechanisms that she could use on Mother's Day; and to discuss how she would use her time while her mother-in-law was away.  Tila stated that she intended to use the week to clean the basement.  Observed activating change talk such as "I'm going to call to have a dumpster dropped off at the house" and "I plan to just toss the bins and not look through them".  Expressed confidence in Mane's ability to get the job done and commended her for having a plan.  Plan: Return again in 2-3 weeks  Diagnosis:     Axis I: Major Depressive Disorder, Recurrent moderate and complicated bereavement  [name], LCSW 11/20/2017

## 2017-12-03 DIAGNOSIS — M79671 Pain in right foot: Secondary | ICD-10-CM | POA: Diagnosis not present

## 2017-12-03 DIAGNOSIS — M791 Myalgia, unspecified site: Secondary | ICD-10-CM | POA: Diagnosis not present

## 2017-12-03 DIAGNOSIS — M79673 Pain in unspecified foot: Secondary | ICD-10-CM | POA: Diagnosis not present

## 2017-12-03 DIAGNOSIS — M79642 Pain in left hand: Secondary | ICD-10-CM | POA: Diagnosis not present

## 2017-12-03 DIAGNOSIS — M2550 Pain in unspecified joint: Secondary | ICD-10-CM | POA: Diagnosis not present

## 2017-12-03 DIAGNOSIS — M19041 Primary osteoarthritis, right hand: Secondary | ICD-10-CM | POA: Diagnosis not present

## 2017-12-03 DIAGNOSIS — M79643 Pain in unspecified hand: Secondary | ICD-10-CM | POA: Diagnosis not present

## 2017-12-03 DIAGNOSIS — M1990 Unspecified osteoarthritis, unspecified site: Secondary | ICD-10-CM | POA: Diagnosis not present

## 2017-12-03 DIAGNOSIS — M25571 Pain in right ankle and joints of right foot: Secondary | ICD-10-CM | POA: Diagnosis not present

## 2017-12-03 DIAGNOSIS — M19072 Primary osteoarthritis, left ankle and foot: Secondary | ICD-10-CM | POA: Diagnosis not present

## 2017-12-03 DIAGNOSIS — M19042 Primary osteoarthritis, left hand: Secondary | ICD-10-CM | POA: Diagnosis not present

## 2017-12-03 DIAGNOSIS — M064 Inflammatory polyarthropathy: Secondary | ICD-10-CM | POA: Diagnosis not present

## 2017-12-03 DIAGNOSIS — M79641 Pain in right hand: Secondary | ICD-10-CM | POA: Diagnosis not present

## 2017-12-03 DIAGNOSIS — M25572 Pain in left ankle and joints of left foot: Secondary | ICD-10-CM | POA: Diagnosis not present

## 2017-12-03 DIAGNOSIS — M19071 Primary osteoarthritis, right ankle and foot: Secondary | ICD-10-CM | POA: Diagnosis not present

## 2017-12-13 ENCOUNTER — Ambulatory Visit (HOSPITAL_COMMUNITY): Payer: Self-pay | Admitting: Licensed Clinical Social Worker

## 2017-12-25 ENCOUNTER — Encounter (HOSPITAL_COMMUNITY): Payer: Self-pay | Admitting: Licensed Clinical Social Worker

## 2017-12-25 ENCOUNTER — Encounter

## 2017-12-25 ENCOUNTER — Ambulatory Visit (HOSPITAL_COMMUNITY): Payer: Medicare HMO | Admitting: Licensed Clinical Social Worker

## 2017-12-25 DIAGNOSIS — R69 Illness, unspecified: Secondary | ICD-10-CM | POA: Diagnosis not present

## 2017-12-25 DIAGNOSIS — F331 Major depressive disorder, recurrent, moderate: Secondary | ICD-10-CM | POA: Diagnosis not present

## 2017-12-25 DIAGNOSIS — Z634 Disappearance and death of family member: Secondary | ICD-10-CM

## 2017-12-25 DIAGNOSIS — Z79899 Other long term (current) drug therapy: Secondary | ICD-10-CM | POA: Diagnosis not present

## 2017-12-25 DIAGNOSIS — F4329 Adjustment disorder with other symptoms: Secondary | ICD-10-CM | POA: Diagnosis not present

## 2017-12-25 DIAGNOSIS — F4321 Adjustment disorder with depressed mood: Secondary | ICD-10-CM

## 2017-12-25 NOTE — Progress Notes (Signed)
   THERAPIST PROGRESS NOTE  Session Time: 3:30pm-4:30pm  Participation Level: Active  Behavioral Response: Well GroomedAlertEuthymic  Type of Therapy: Individual Therapy  Treatment Goals addressed: Improve Psychiatric Symptoms, Elevate Mood, Improve Unhelpful Thought Patterns, decrease anxiety, learn about diagnosis, healthy coping skills   Interventions: Motivational Interviewing, CBT, Grounding & Mindfulness Techniques  Summary: [name] is a 52  y.o. female who presents with Major Depressive Disorder, Recurrent Severe, without psychotic features, and Generalized Anxiety Disorder  Suicidal/Homicidal: No -without intent/plan  Therapist Response: [name] met with clinician for an individual session. [name] discussed her psychiatric symptoms and her current life events. [name] shared that she has been feeling pretty good this week. She identified improvement since her mother in law has been with brother in law this week. Clinician discussed self care activities that have been done over the past week and noted positive outcomes. Clinician also encouraged [name] to memorize the feeling of happiness that is present now and to work on bringing it back using mindfulness. [name] reports medication has been helpful in keeping mood more even. [name] also noted that it has been 9 months since mother died and she is ready to get through the first year. Clinician reviewed processed of grief and noted the value of making it through the first year in order to define the "new normal".   Plan: Return again in 4 weeks  Diagnosis:     Axis I: Major Depressive Disorder, Recurrent Severe, without psychotic features, and Generalized Anxiety Disorder   [name], LCSW 12/25/2017

## 2017-12-26 DIAGNOSIS — M791 Myalgia, unspecified site: Secondary | ICD-10-CM | POA: Diagnosis not present

## 2017-12-26 DIAGNOSIS — M064 Inflammatory polyarthropathy: Secondary | ICD-10-CM | POA: Diagnosis not present

## 2017-12-26 DIAGNOSIS — M79643 Pain in unspecified hand: Secondary | ICD-10-CM | POA: Diagnosis not present

## 2017-12-26 DIAGNOSIS — E669 Obesity, unspecified: Secondary | ICD-10-CM | POA: Diagnosis not present

## 2017-12-26 DIAGNOSIS — M1990 Unspecified osteoarthritis, unspecified site: Secondary | ICD-10-CM | POA: Diagnosis not present

## 2017-12-26 DIAGNOSIS — M79671 Pain in right foot: Secondary | ICD-10-CM | POA: Diagnosis not present

## 2017-12-26 DIAGNOSIS — M797 Fibromyalgia: Secondary | ICD-10-CM | POA: Diagnosis not present

## 2017-12-26 DIAGNOSIS — M2550 Pain in unspecified joint: Secondary | ICD-10-CM | POA: Diagnosis not present

## 2018-01-09 ENCOUNTER — Ambulatory Visit (HOSPITAL_COMMUNITY): Payer: Medicare HMO | Admitting: Licensed Clinical Social Worker

## 2018-01-14 ENCOUNTER — Ambulatory Visit (HOSPITAL_COMMUNITY): Payer: Self-pay | Admitting: Psychiatry

## 2018-01-15 DIAGNOSIS — M5030 Other cervical disc degeneration, unspecified cervical region: Secondary | ICD-10-CM | POA: Diagnosis not present

## 2018-01-15 DIAGNOSIS — M159 Polyosteoarthritis, unspecified: Secondary | ICD-10-CM | POA: Diagnosis not present

## 2018-01-22 ENCOUNTER — Ambulatory Visit (HOSPITAL_COMMUNITY): Payer: Self-pay | Admitting: Licensed Clinical Social Worker

## 2018-01-24 ENCOUNTER — Encounter (HOSPITAL_COMMUNITY): Payer: Self-pay | Admitting: Psychiatry

## 2018-01-24 ENCOUNTER — Ambulatory Visit (HOSPITAL_COMMUNITY): Payer: Medicare HMO | Admitting: Psychiatry

## 2018-01-24 VITALS — BP 132/80 | HR 90 | Ht 63.0 in | Wt 304.8 lb

## 2018-01-24 DIAGNOSIS — F5105 Insomnia due to other mental disorder: Secondary | ICD-10-CM

## 2018-01-24 DIAGNOSIS — F411 Generalized anxiety disorder: Secondary | ICD-10-CM

## 2018-01-24 DIAGNOSIS — F99 Mental disorder, not otherwise specified: Secondary | ICD-10-CM | POA: Diagnosis not present

## 2018-01-24 DIAGNOSIS — F332 Major depressive disorder, recurrent severe without psychotic features: Secondary | ICD-10-CM

## 2018-01-24 DIAGNOSIS — R69 Illness, unspecified: Secondary | ICD-10-CM | POA: Diagnosis not present

## 2018-01-24 MED ORDER — DULOXETINE HCL 60 MG PO CPEP: 60.0000 mg | ORAL_CAPSULE | Freq: Two times a day (BID) | ORAL | 1 refills | Status: DC

## 2018-01-24 MED ORDER — MIRTAZAPINE 30 MG PO TABS: 60.0000 mg | ORAL_TABLET | Freq: Every day | ORAL | 1 refills | Status: DC

## 2018-01-24 MED ORDER — ESZOPICLONE 3 MG PO TABS: 3.0000 mg | ORAL_TABLET | Freq: Every evening | ORAL | 1 refills | Status: DC | PRN

## 2018-01-24 MED ORDER — LITHIUM CARBONATE ER 450 MG PO TBCR: 450.0000 mg | EXTENDED_RELEASE_TABLET | Freq: Every day | ORAL | 1 refills | Status: DC

## 2018-01-24 MED ORDER — LORAZEPAM 1 MG PO TABS: 1.0000 mg | ORAL_TABLET | Freq: Two times a day (BID) | ORAL | 1 refills | Status: DC | PRN
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
## 2018-01-28 DIAGNOSIS — M13842 Other specified arthritis, left hand: Secondary | ICD-10-CM | POA: Diagnosis not present

## 2018-01-28 DIAGNOSIS — G5622 Lesion of ulnar nerve, left upper limb: Secondary | ICD-10-CM | POA: Diagnosis not present

## 2018-02-05 ENCOUNTER — Ambulatory Visit (HOSPITAL_COMMUNITY): Payer: Medicare HMO | Admitting: Licensed Clinical Social Worker

## 2018-02-05 ENCOUNTER — Encounter (HOSPITAL_COMMUNITY): Payer: Self-pay | Admitting: Licensed Clinical Social Worker

## 2018-02-05 DIAGNOSIS — F331 Major depressive disorder, recurrent, moderate: Secondary | ICD-10-CM | POA: Diagnosis not present

## 2018-02-05 DIAGNOSIS — R69 Illness, unspecified: Secondary | ICD-10-CM | POA: Diagnosis not present

## 2018-02-05 DIAGNOSIS — F411 Generalized anxiety disorder: Secondary | ICD-10-CM

## 2018-02-05 NOTE — Progress Notes (Signed)
   THERAPIST PROGRESS NOTE  Session Time: 1:30pm-2:30pm  Participation Level: Active  Behavioral Response: NeatAlertEuthymic  Type of Therapy: Individual Therapy  Treatment Goals addressed: Improve Psychiatric Symptoms, Elevate Mood, Improve Unhelpful Thought Patterns, decrease anxiety, learn about diagnosis, healthy coping skills   Interventions: Motivational Interviewing, CBT, Grounding & Mindfulness Techniques  Summary: [name] is a 52  y.o. female who presents with Major Depressive Disorder, Recurrent moderate, without psychotic features, and Generalized Anxiety Disorder  Suicidal/Homicidal: No -without intent/plan  Therapist Response: [name] met with clinician for an individual session. Ercell discussed her psychiatric symptoms and her current life events. [name] shared that she has been up and down with good days and bad days. However, she reports she is overall feeling more even-keeled and she believes the medication is really helping her not "blow up" in anger. Clinician explored her interactions with her mother-in-law. She reported that she no longer gets angry when mother in law shows her dementia and she has more support from her husband. [name] also reports her brother in law is now on board if they decide to put mother in law in a nursing home. Clinician offered alternatives, such as hiring a CNA to take some of the responsibilities of caring for mother in law.  [name] processed her ongoing grief after the loss of her mother in September 2018. Clinician provided feedback and discussed plans to bury mother's ashes. [name] reports she is not ready yet, but that eventually she will, because that's what mom wanted her to do.   Plan: Return again in 1 weeks  Diagnosis:     Axis I: Major Depressive Disorder, Recurrent moderate, without psychotic features, and Generalized Anxiety Disorder   [name], LCSW 02/05/2018

## 2018-02-06 DIAGNOSIS — M25531 Pain in right wrist: Secondary | ICD-10-CM | POA: Diagnosis not present

## 2018-02-06 DIAGNOSIS — Z79891 Long term (current) use of opiate analgesic: Secondary | ICD-10-CM | POA: Diagnosis not present

## 2018-02-06 DIAGNOSIS — Z79899 Other long term (current) drug therapy: Secondary | ICD-10-CM | POA: Diagnosis not present

## 2018-02-06 DIAGNOSIS — M542 Cervicalgia: Secondary | ICD-10-CM | POA: Diagnosis not present

## 2018-02-06 DIAGNOSIS — G5622 Lesion of ulnar nerve, left upper limb: Secondary | ICD-10-CM | POA: Diagnosis not present

## 2018-02-06 DIAGNOSIS — G894 Chronic pain syndrome: Secondary | ICD-10-CM | POA: Diagnosis not present

## 2018-02-12 DIAGNOSIS — G5622 Lesion of ulnar nerve, left upper limb: Secondary | ICD-10-CM | POA: Diagnosis not present

## 2018-02-14 DIAGNOSIS — M1990 Unspecified osteoarthritis, unspecified site: Secondary | ICD-10-CM | POA: Diagnosis not present

## 2018-02-14 DIAGNOSIS — R69 Illness, unspecified: Secondary | ICD-10-CM | POA: Diagnosis not present

## 2018-02-14 DIAGNOSIS — M25522 Pain in left elbow: Secondary | ICD-10-CM | POA: Diagnosis not present

## 2018-02-14 DIAGNOSIS — Z0000 Encounter for general adult medical examination without abnormal findings: Secondary | ICD-10-CM | POA: Diagnosis not present

## 2018-02-14 DIAGNOSIS — E039 Hypothyroidism, unspecified: Secondary | ICD-10-CM | POA: Diagnosis not present

## 2018-02-14 DIAGNOSIS — R609 Edema, unspecified: Secondary | ICD-10-CM | POA: Diagnosis not present

## 2018-02-14 DIAGNOSIS — E1169 Type 2 diabetes mellitus with other specified complication: Secondary | ICD-10-CM | POA: Diagnosis not present

## 2018-02-14 DIAGNOSIS — E7800 Pure hypercholesterolemia, unspecified: Secondary | ICD-10-CM | POA: Diagnosis not present

## 2018-02-14 DIAGNOSIS — I10 Essential (primary) hypertension: Secondary | ICD-10-CM | POA: Diagnosis not present

## 2018-02-14 DIAGNOSIS — N3281 Overactive bladder: Secondary | ICD-10-CM | POA: Diagnosis not present

## 2018-02-19 ENCOUNTER — Encounter (HOSPITAL_COMMUNITY): Payer: Self-pay | Admitting: Licensed Clinical Social Worker

## 2018-02-19 ENCOUNTER — Ambulatory Visit (HOSPITAL_COMMUNITY): Payer: Medicare HMO | Admitting: Licensed Clinical Social Worker

## 2018-02-19 DIAGNOSIS — F331 Major depressive disorder, recurrent, moderate: Secondary | ICD-10-CM

## 2018-02-19 DIAGNOSIS — R69 Illness, unspecified: Secondary | ICD-10-CM | POA: Diagnosis not present

## 2018-02-19 DIAGNOSIS — F411 Generalized anxiety disorder: Secondary | ICD-10-CM

## 2018-02-19 NOTE — Progress Notes (Signed)
   THERAPIST PROGRESS NOTE  Session Time: 1:35pm-2:30pm  Participation Level: Active  Behavioral Response: NeatAlertEuthymic  Type of Therapy: Individual Therapy  Treatment Goals addressed: Improve Psychiatric Symptoms, Elevate Mood, Improve Unhelpful Thought Patterns, decrease anxiety, learn about diagnosis, healthy coping skills   Interventions: Motivational Interviewing, CBT, Grounding & Mindfulness Techniques  Summary: [name] is a 52  y.o. female who presents with Major Depressive Disorder, Recurrent moderate, without psychotic features, and Generalized Anxiety Disorder  Suicidal/Homicidal: No -without intent/plan  Therapist Response: [name] met with clinician for an individual session. [name] discussed her psychiatric symptoms and her current life events. [name] shared that Sunday was the first anniversary of her mother's passing and that she thought that she'd done well.  Additionally, she shared that she was no longer allowing her mother-in-law to stress her out, and that she had plans to start cleaning out her bedroom.  Clinician used CBT and MI to assist [name] with processing her thoughts and feelings related to burying her mother's ashes, planning her [employer], and continuing to care for her elderly mother-in-law.  Clinician used Solution Focused therapy to assist [name] with creating a plan to clean her bedroom.  Plan: Return again in 3-4 weeks  Diagnosis:     Axis I: Major Depressive Disorder, Recurrent Moderate, without psychotic features, and Generalized Anxiety Disorder   Jessica Curling, LCSW 02/19/2018

## 2018-02-20 DIAGNOSIS — G894 Chronic pain syndrome: Secondary | ICD-10-CM | POA: Diagnosis not present

## 2018-02-20 DIAGNOSIS — M542 Cervicalgia: Secondary | ICD-10-CM | POA: Diagnosis not present

## 2018-02-20 DIAGNOSIS — G5622 Lesion of ulnar nerve, left upper limb: Secondary | ICD-10-CM | POA: Diagnosis not present

## 2018-02-22 ENCOUNTER — Other Ambulatory Visit: Payer: Self-pay | Admitting: Family Medicine

## 2018-02-22 DIAGNOSIS — Z1231 Encounter for screening mammogram for malignant neoplasm of breast: Secondary | ICD-10-CM

## 2018-02-25 DIAGNOSIS — G5622 Lesion of ulnar nerve, left upper limb: Secondary | ICD-10-CM | POA: Diagnosis not present

## 2018-02-26 ENCOUNTER — Ambulatory Visit
Admission: RE | Admit: 2018-02-26 | Discharge: 2018-02-26 | Disposition: A | Discharge status: Home / Self Care | Payer: Self-pay | Source: Ambulatory Visit | Attending: Family Medicine | Admitting: Family Medicine

## 2018-02-26 DIAGNOSIS — Z1231 Encounter for screening mammogram for malignant neoplasm of breast: Secondary | ICD-10-CM

## 2018-03-05 ENCOUNTER — Ambulatory Visit (HOSPITAL_COMMUNITY): Payer: Self-pay | Admitting: Licensed Clinical Social Worker

## 2018-03-06 ENCOUNTER — Other Ambulatory Visit: Payer: Self-pay | Admitting: Pain Medicine

## 2018-03-06 DIAGNOSIS — M542 Cervicalgia: Secondary | ICD-10-CM

## 2018-03-15 ENCOUNTER — Ambulatory Visit
Admission: RE | Admit: 2018-03-15 | Discharge: 2018-03-15 | Disposition: A | Discharge status: Home / Self Care | Payer: Medicare HMO | Source: Ambulatory Visit | Attending: Pain Medicine | Admitting: Pain Medicine

## 2018-03-15 DIAGNOSIS — M542 Cervicalgia: Secondary | ICD-10-CM

## 2018-03-15 DIAGNOSIS — M4802 Spinal stenosis, cervical region: Secondary | ICD-10-CM | POA: Diagnosis not present

## 2018-03-20 ENCOUNTER — Other Ambulatory Visit: Payer: Self-pay | Admitting: Neurological Surgery

## 2018-03-20 DIAGNOSIS — Z6841 Body Mass Index (BMI) 40.0 and over, adult: Secondary | ICD-10-CM | POA: Diagnosis not present

## 2018-03-20 DIAGNOSIS — M5412 Radiculopathy, cervical region: Secondary | ICD-10-CM | POA: Diagnosis not present

## 2018-03-21 ENCOUNTER — Ambulatory Visit (HOSPITAL_COMMUNITY): Payer: Medicare HMO | Admitting: Licensed Clinical Social Worker

## 2018-03-21 DIAGNOSIS — R69 Illness, unspecified: Secondary | ICD-10-CM | POA: Diagnosis not present

## 2018-03-21 DIAGNOSIS — F4321 Adjustment disorder with depressed mood: Secondary | ICD-10-CM

## 2018-03-21 DIAGNOSIS — F411 Generalized anxiety disorder: Secondary | ICD-10-CM

## 2018-03-21 DIAGNOSIS — F332 Major depressive disorder, recurrent severe without psychotic features: Secondary | ICD-10-CM

## 2018-03-21 NOTE — Progress Notes (Signed)
   THERAPIST PROGRESS NOTE  Session Time: 11:00am-12:00pm  Participation Level: Active  Behavioral Response: CasualAlertDepressed  Type of Therapy: Individual Therapy  Treatment Goals addressed: Improve Psychiatric Symptoms, Elevate Mood, Improve Unhelpful Thought Patterns, decrease anxiety, learn about diagnosis, healthy coping skills   Interventions: Motivational Interviewing, CBT, Grounding & Mindfulness Techniques  Summary: [name] is a 52  y.o. female who presents with Major Depressive Disorder, Recurrent Severe, without psychotic features, and Generalized Anxiety Disorder  Suicidal/Homicidal: No -without intent/plan  Therapist Response: [name] met with clinician for an individual session. [name] discussed her psychiatric symptoms and her current life events. [name] shared that today was the 1 year anniversary of her mother's death. She spent the entire session crying, unable to calm herself down and having some difficulty seeing an end to the tears. Clinician utilized supportive grief therapy and reminded [name] that she prepared herself for today being hard. Clinician explored ways to commemorate mom by doing something she loved or eating something mom loved to eat. Clinician also encouraged self care.  [name] reports she is having back/neck surgery next week. She will come back once surgery has been completed and she is medically cleared.   Plan: Return again in 3-4 weeks  Diagnosis:     Axis I: Major Depressive Disorder, Recurrent Severe, without psychotic features, and Generalized Anxiety Disorder   [name], LCSW 03/21/2018

## 2018-03-22 ENCOUNTER — Encounter (HOSPITAL_COMMUNITY): Payer: Self-pay | Admitting: Licensed Clinical Social Worker

## 2018-03-22 ENCOUNTER — Encounter (HOSPITAL_COMMUNITY): Payer: Self-pay

## 2018-03-22 NOTE — Pre-Procedure Instructions (Signed)
[name]  03/22/2018      [facility] [place], [place] - [address] [place] [address] Phone: [phone] Fax: [phone]    Your procedure is scheduled on Tuesday September 10th.  Report to [facility] [place].M.  Call this number if you have problems the morning of surgery:  [phone]   Remember:  Do not eat or drink after midnight.     Take these medicines the morning of surgery with A SIP OF WATER   Albuterol (if needed) Bring with you morning of surgery  Flexeril (if needed)  Cymbalta  Synthroid  Ativan (if needed)  Oxybutynin  7 days prior to surgery STOP taking any Aspirin(unless otherwise instructed by your surgeon), Aleve, Naproxen, Ibuprofen, Motrin, Advil, Goody's, BC's, all herbal medications, fish oil, and all vitamins   WHAT DO I DO ABOUT MY DIABETES MEDICATION?   Marland Kitchen Do not take oral diabetes medicines (pills) the morning of surgery. Metformin   How to Manage Your Diabetes Before and After Surgery  Why is it important to control my blood sugar before and after surgery? . Improving blood sugar levels before and after surgery helps healing and can limit problems. . A way of improving blood sugar control is eating a healthy diet by: o  Eating less sugar and carbohydrates o  Increasing activity/exercise o  Talking with your doctor about reaching your blood sugar goals . High blood sugars (greater than 180 mg/dL) can raise your risk of infections and slow your recovery, so you will need to focus on controlling your diabetes during the weeks before surgery. . Make sure that the doctor who takes care of your diabetes knows about your planned surgery including the date and location.  How do I manage my blood sugar before surgery? . Check your blood sugar at least 4 times a day, starting 2 days before surgery, to make sure that the level is not too high or  low. o Check your blood sugar the morning of your surgery when you wake up and every 2 hours until you get to the Short Stay unit. . If your blood sugar is less than 70 mg/dL, you will need to treat for low blood sugar: o Do not take insulin. o Treat a low blood sugar (less than 70 mg/dL) with  cup of clear juice (cranberry or apple), 4 glucose tablets, OR glucose gel. o Recheck blood sugar in 15 minutes after treatment (to make sure it is greater than 70 mg/dL). If your blood sugar is not greater than 70 mg/dL on recheck, call [phone] for further instructions. . Report your blood sugar to the short stay nurse when you get to Short Stay.  . If you are admitted to the hospital after surgery: o Your blood sugar will be checked by the staff and you will probably be given insulin after surgery (instead of oral diabetes medicines) to make sure you have good blood sugar levels. o The goal for blood sugar control after surgery is 80-180 mg/dL.     Do not wear jewelry, make-up or nail polish.  Do not wear lotions, powders, or perfumes, or deodorant.  Do not shave 48 hours prior to surgery.  Men may shave face and neck.  Do not bring valuables to the hospital.  [facility] is not responsible for any belongings or valuables.  Contacts, dentures or bridgework may not be worn into surgery.  Leave your suitcase in the car.  After surgery it may be brought to your room.  For patients admitted to the hospital, discharge time will be determined by your treatment team.  Patients discharged the day of surgery will not be allowed to drive home.    [facility]- Preparing For Surgery  Before surgery, you can play an important role. Because skin is not sterile, your skin needs to be as free of germs as possible. You can reduce the number of germs on your skin by washing with CHG (chlorahexidine gluconate) Soap before surgery.  CHG is an antiseptic cleaner which kills germs and bonds with the skin to  continue killing germs even after washing.    Oral Hygiene is also important to reduce your risk of infection.  Remember - BRUSH YOUR TEETH THE MORNING OF SURGERY WITH YOUR REGULAR TOOTHPASTE  Please do not use if you have an allergy to CHG or antibacterial soaps. If your skin becomes reddened/irritated stop using the CHG.  Do not shave (including legs and underarms) for at least 48 hours prior to first CHG shower. It is OK to shave your face.  Please follow these instructions carefully.   1. Shower the NIGHT BEFORE SURGERY and the MORNING OF SURGERY with CHG.   2. If you chose to wash your hair, wash your hair first as usual with your normal shampoo.  3. After you shampoo, rinse your hair and body thoroughly to remove the shampoo.  4. Use CHG as you would any other liquid soap. You can apply CHG directly to the skin and wash gently with a scrungie or a clean washcloth.   5. Apply the CHG Soap to your body ONLY FROM THE NECK DOWN.  Do not use on open wounds or open sores. Avoid contact with your eyes, ears, mouth and genitals (private parts). Wash Face and genitals (private parts)  with your normal soap.  6. Wash thoroughly, paying special attention to the area where your surgery will be performed.  7. Thoroughly rinse your body with warm water from the neck down.  8. DO NOT shower/wash with your normal soap after using and rinsing off the CHG Soap.  9. Pat yourself dry with a CLEAN TOWEL.  10. Wear CLEAN PAJAMAS to bed the night before surgery, wear comfortable clothes the morning of surgery  11. Place CLEAN SHEETS on your bed the night of your first shower and DO NOT SLEEP WITH PETS.    Day of Surgery:  Do not apply any deodorants/lotions.  Please wear clean clothes to the hospital/surgery center.   Remember to brush your teeth WITH YOUR REGULAR TOOTHPASTE.    Please read over the following fact sheets that you were given. Coughing and Deep Breathing, MRSA Information and  Surgical Site Infection Prevention

## 2018-03-25 ENCOUNTER — Encounter (HOSPITAL_COMMUNITY)
Admission: RE | Admit: 2018-03-25 | Discharge: 2018-03-25 | Disposition: A | Discharge status: Home / Self Care | Payer: Medicare HMO | Source: Ambulatory Visit | Attending: Neurological Surgery | Admitting: Neurological Surgery

## 2018-03-25 ENCOUNTER — Encounter (HOSPITAL_COMMUNITY): Payer: Self-pay

## 2018-03-25 ENCOUNTER — Other Ambulatory Visit: Payer: Self-pay

## 2018-03-25 DIAGNOSIS — R69 Illness, unspecified: Secondary | ICD-10-CM | POA: Diagnosis not present

## 2018-03-25 DIAGNOSIS — J45909 Unspecified asthma, uncomplicated: Secondary | ICD-10-CM | POA: Diagnosis not present

## 2018-03-25 DIAGNOSIS — K219 Gastro-esophageal reflux disease without esophagitis: Secondary | ICD-10-CM | POA: Diagnosis not present

## 2018-03-25 DIAGNOSIS — I10 Essential (primary) hypertension: Secondary | ICD-10-CM | POA: Diagnosis not present

## 2018-03-25 DIAGNOSIS — M797 Fibromyalgia: Secondary | ICD-10-CM | POA: Diagnosis not present

## 2018-03-25 DIAGNOSIS — G4733 Obstructive sleep apnea (adult) (pediatric): Secondary | ICD-10-CM | POA: Diagnosis not present

## 2018-03-25 DIAGNOSIS — Z79899 Other long term (current) drug therapy: Secondary | ICD-10-CM | POA: Diagnosis not present

## 2018-03-25 DIAGNOSIS — F4310 Post-traumatic stress disorder, unspecified: Secondary | ICD-10-CM | POA: Diagnosis not present

## 2018-03-25 DIAGNOSIS — F319 Bipolar disorder, unspecified: Secondary | ICD-10-CM | POA: Diagnosis not present

## 2018-03-25 DIAGNOSIS — Z794 Long term (current) use of insulin: Secondary | ICD-10-CM | POA: Diagnosis not present

## 2018-03-25 DIAGNOSIS — M5412 Radiculopathy, cervical region: Secondary | ICD-10-CM | POA: Diagnosis not present

## 2018-03-25 DIAGNOSIS — M4802 Spinal stenosis, cervical region: Secondary | ICD-10-CM | POA: Diagnosis not present

## 2018-03-25 DIAGNOSIS — E039 Hypothyroidism, unspecified: Secondary | ICD-10-CM | POA: Diagnosis not present

## 2018-03-25 DIAGNOSIS — Z6841 Body Mass Index (BMI) 40.0 and over, adult: Secondary | ICD-10-CM | POA: Diagnosis not present

## 2018-03-25 DIAGNOSIS — E785 Hyperlipidemia, unspecified: Secondary | ICD-10-CM | POA: Diagnosis not present

## 2018-03-25 DIAGNOSIS — E119 Type 2 diabetes mellitus without complications: Secondary | ICD-10-CM | POA: Diagnosis not present

## 2018-03-25 HISTORY — DX: Morbid (severe) obesity due to excess calories: E66.01

## 2018-03-25 HISTORY — DX: Body Mass Index (BMI) 40.0 and over, adult: Z684

## 2018-03-25 HISTORY — DX: Radiculopathy, cervical region: M54.12

## 2018-03-25 LAB — CBC
HCT: 46.3 % — ABNORMAL HIGH (ref 36.0–46.0)
HEMOGLOBIN: 14.9 g/dL (ref 12.0–15.0)
MCH: 30.4 pg (ref 26.0–34.0)
MCHC: 32.2 g/dL (ref 30.0–36.0)
MCV: 94.5 fL (ref 78.0–100.0)
Platelets: 292 10*3/uL (ref 150–400)
RBC: 4.9 MIL/uL (ref 3.87–5.11)
RDW: 14.3 % (ref 11.5–15.5)
WBC: 11.6 10*3/uL — ABNORMAL HIGH (ref 4.0–10.5)

## 2018-03-25 LAB — SURGICAL PCR SCREEN
MRSA, PCR: NEGATIVE
STAPHYLOCOCCUS AUREUS: NEGATIVE

## 2018-03-25 LAB — TYPE AND SCREEN
ABO/RH(D): O NEG
Antibody Screen: NEGATIVE

## 2018-03-25 LAB — BASIC METABOLIC PANEL
Anion gap: 11 (ref 5–15)
BUN: 13 mg/dL (ref 6–20)
CHLORIDE: 101 mmol/L (ref 98–111)
CO2: 26 mmol/L (ref 22–32)
CREATININE: 0.78 mg/dL (ref 0.44–1.00)
Calcium: 10.1 mg/dL (ref 8.9–10.3)
GFR calc non Af Amer: >60 mL/min (ref >60)
Glucose, Bld: 178 mg/dL — ABNORMAL HIGH (ref 70–99)
POTASSIUM: 4.1 mmol/L (ref 3.5–5.1)
SODIUM: 138 mmol/L (ref 135–145)

## 2018-03-25 LAB — GLUCOSE, CAPILLARY: Glucose-Capillary: 214 mg/dL — ABNORMAL HIGH (ref 70–99)

## 2018-03-25 MED ORDER — DEXTROSE 5 % IV SOLN
3.0000 g | INTRAVENOUS | Status: AC
  Administered 2018-03-26: 3 g via INTRAVENOUS
  Filled 2018-03-25: qty 3

## 2018-03-25 NOTE — Progress Notes (Signed)
Pt denies SOB, chest pain, and being under the care of a cardiologist. Pt denies having a cardiac cath.  Pt denies having an EKG and chest x ray within the last year. Pt denies recent labs. Pt agreed to bring in CPAP mask and tubing on DOS.

## 2018-03-25 NOTE — Pre-Procedure Instructions (Signed)
TOREE EDLING  03/25/2018      [facility] [place], [place] [address] Phone: [phone] Fax: [phone]    Your procedure is scheduled on Tuesday March 26, 2018  Report to [facility] Admitting at 5:30 A.M.  Call this number if you have problems the morning of surgery:  [phone]   Remember:  Do not eat or drink after midnight.     Take these medicines the morning of surgery with A SIP OF WATER   Albuterol (if needed) Bring with you morning of surgery  Cymbalta  Synthroid  Ativan (if needed)    7 days prior to surgery STOP taking any Aspirin (unless otherwise instructed by your surgeon), Aleve, Naproxen, Ibuprofen, Motrin, Advil, Goody's, BC's, all herbal medications, fish oil, Biotin, and all vitamins  WHAT DO I DO ABOUT MY DIABETES MEDICATION?  Marland Kitchen Do not take oral diabetes medicines (pills) the morning of surgery. Metformin  How to Manage Your Diabetes Before and After Surgery  Why is it important to control my blood sugar before and after surgery? . Improving blood sugar levels before and after surgery helps healing and can limit problems. . A way of improving blood sugar control is eating a healthy diet by: o  Eating less sugar and carbohydrates o  Increasing activity/exercise o  Talking with your doctor about reaching your blood sugar goals . High blood sugars (greater than 180 mg/dL) can raise your risk of infections and slow your recovery, so you will need to focus on controlling your diabetes during the weeks before surgery. . Make sure that the doctor who takes care of your diabetes knows about your planned surgery including the date and location.  How do I manage my blood sugar before surgery? . Check your blood sugar at least 4 times a day, starting 2 days before surgery, to make sure that the level is not too high or low. o Check your blood sugar the  morning of your surgery when you wake up and every 2 hours until you get to the Short Stay unit. . If your blood sugar is less than 70 mg/dL, you will need to treat for low blood sugar: o Do not take insulin. o Treat a low blood sugar (less than 70 mg/dL) with  cup of clear juice (cranberry or apple), 4 glucose tablets, OR glucose gel. o Recheck blood sugar in 15 minutes after treatment (to make sure it is greater than 70 mg/dL). If your blood sugar is not greater than 70 mg/dL on recheck, call [phone] for further instructions. . Report your blood sugar to the short stay nurse when you get to Short Stay.  . If you are admitted to the hospital after surgery: o Your blood sugar will be checked by the staff and you will probably be given insulin after surgery (instead of oral diabetes medicines) to make sure you have good blood sugar levels. o The goal for blood sugar control after surgery is 80-180 mg/dL.     Do not wear jewelry, make-up or nail polish.  Do not wear lotions, powders, or perfumes, or deodorant.  Do not shave 48 hours prior to surgery.  Men may shave face and neck.  Do not bring valuables to the hospital.  [facility] is not responsible for any belongings or valuables.  Contacts, dentures or bridgework may not be worn into surgery.  Leave your suitcase in  the car.  After surgery it may be brought to your room.  Patients discharged the day of surgery will not be allowed to drive home.    [place]- Preparing For Surgery  Before surgery, you can play an important role. Because skin is not sterile, your skin needs to be as free of germs as possible. You can reduce the number of germs on your skin by washing with CHG (chlorahexidine gluconate) Soap before surgery.  CHG is an antiseptic cleaner which kills germs and bonds with the skin to continue killing germs even after washing.    Oral Hygiene is also important to reduce your risk of infection.  Remember - BRUSH YOUR  TEETH THE MORNING OF SURGERY WITH YOUR REGULAR TOOTHPASTE  Please do not use if you have an allergy to CHG or antibacterial soaps. If your skin becomes reddened/irritated stop using the CHG.  Do not shave (including legs and underarms) for at least 48 hours prior to first CHG shower. It is OK to shave your face.  Please follow these instructions carefully.   1. Shower the NIGHT BEFORE SURGERY and the MORNING OF SURGERY with CHG.   2. If you chose to wash your hair, wash your hair first as usual with your normal shampoo.  3. After you shampoo, rinse your hair and body thoroughly to remove the shampoo.  4. Use CHG as you would any other liquid soap. You can apply CHG directly to the skin and wash gently with a scrungie or a clean washcloth.   5. Apply the CHG Soap to your body ONLY FROM THE NECK DOWN.  Do not use on open wounds or open sores. Avoid contact with your eyes, ears, mouth and genitals (private parts). Wash Face and genitals (private parts)  with your normal soap.  6. Wash thoroughly, paying special attention to the area where your surgery will be performed.  7. Thoroughly rinse your body with warm water from the neck down.  8. DO NOT shower/wash with your normal soap after using and rinsing off the CHG Soap.  9. Pat yourself dry with a CLEAN TOWEL.  10. Wear CLEAN PAJAMAS to bed the night before surgery, wear comfortable clothes the morning of surgery  11. Place CLEAN SHEETS on your bed the night of your first shower and DO NOT SLEEP WITH PETS.    Day of Surgery:  Do not apply any deodorants/lotions.  Please wear clean clothes to the hospital/surgery center.   Remember to brush your teeth WITH YOUR REGULAR TOOTHPASTE.    Please read over the following fact sheets that you were given. Coughing and Deep Breathing, MRSA Information and Surgical Site Infection Prevention

## 2018-03-26 ENCOUNTER — Observation Stay (HOSPITAL_COMMUNITY)
Admission: RE | Admit: 2018-03-26 | Discharge: 2018-03-27 | Disposition: A | Discharge status: Home / Self Care | Payer: Medicare HMO | Source: Ambulatory Visit | Attending: Neurological Surgery | Admitting: Neurological Surgery

## 2018-03-26 ENCOUNTER — Encounter (HOSPITAL_COMMUNITY)
Admission: RE | Disposition: A | Discharge status: Home / Self Care | Payer: Self-pay | Source: Ambulatory Visit | Attending: Neurological Surgery

## 2018-03-26 ENCOUNTER — Encounter (HOSPITAL_COMMUNITY): Payer: Self-pay | Admitting: *Deleted

## 2018-03-26 ENCOUNTER — Ambulatory Visit (HOSPITAL_COMMUNITY): Payer: Medicare HMO | Admitting: Certified Registered"

## 2018-03-26 ENCOUNTER — Ambulatory Visit (HOSPITAL_COMMUNITY): Payer: Medicare HMO | Admitting: Physician Assistant

## 2018-03-26 ENCOUNTER — Ambulatory Visit (HOSPITAL_COMMUNITY): Payer: Medicare HMO

## 2018-03-26 DIAGNOSIS — R69 Illness, unspecified: Secondary | ICD-10-CM | POA: Diagnosis not present

## 2018-03-26 DIAGNOSIS — G4733 Obstructive sleep apnea (adult) (pediatric): Secondary | ICD-10-CM | POA: Insufficient documentation

## 2018-03-26 DIAGNOSIS — M4802 Spinal stenosis, cervical region: Secondary | ICD-10-CM | POA: Insufficient documentation

## 2018-03-26 DIAGNOSIS — E039 Hypothyroidism, unspecified: Secondary | ICD-10-CM | POA: Insufficient documentation

## 2018-03-26 DIAGNOSIS — Z419 Encounter for procedure for purposes other than remedying health state, unspecified: Secondary | ICD-10-CM

## 2018-03-26 DIAGNOSIS — M5412 Radiculopathy, cervical region: Principal | ICD-10-CM | POA: Diagnosis present

## 2018-03-26 DIAGNOSIS — J45909 Unspecified asthma, uncomplicated: Secondary | ICD-10-CM | POA: Insufficient documentation

## 2018-03-26 DIAGNOSIS — I10 Essential (primary) hypertension: Secondary | ICD-10-CM | POA: Diagnosis not present

## 2018-03-26 DIAGNOSIS — Z79899 Other long term (current) drug therapy: Secondary | ICD-10-CM | POA: Insufficient documentation

## 2018-03-26 DIAGNOSIS — E785 Hyperlipidemia, unspecified: Secondary | ICD-10-CM | POA: Insufficient documentation

## 2018-03-26 DIAGNOSIS — M4323 Fusion of spine, cervicothoracic region: Secondary | ICD-10-CM | POA: Diagnosis not present

## 2018-03-26 DIAGNOSIS — Z794 Long term (current) use of insulin: Secondary | ICD-10-CM | POA: Insufficient documentation

## 2018-03-26 DIAGNOSIS — Z6841 Body Mass Index (BMI) 40.0 and over, adult: Secondary | ICD-10-CM | POA: Insufficient documentation

## 2018-03-26 DIAGNOSIS — F319 Bipolar disorder, unspecified: Secondary | ICD-10-CM | POA: Insufficient documentation

## 2018-03-26 DIAGNOSIS — M797 Fibromyalgia: Secondary | ICD-10-CM | POA: Insufficient documentation

## 2018-03-26 DIAGNOSIS — E119 Type 2 diabetes mellitus without complications: Secondary | ICD-10-CM | POA: Insufficient documentation

## 2018-03-26 DIAGNOSIS — F4310 Post-traumatic stress disorder, unspecified: Secondary | ICD-10-CM | POA: Insufficient documentation

## 2018-03-26 DIAGNOSIS — K219 Gastro-esophageal reflux disease without esophagitis: Secondary | ICD-10-CM | POA: Insufficient documentation

## 2018-03-26 DIAGNOSIS — M5013 Cervical disc disorder with radiculopathy, cervicothoracic region: Secondary | ICD-10-CM | POA: Diagnosis not present

## 2018-03-26 HISTORY — PX: ANTERIOR CERVICAL DECOMP/DISCECTOMY FUSION: SHX1161

## 2018-03-26 LAB — GLUCOSE, CAPILLARY
GLUCOSE-CAPILLARY: 176 mg/dL — AB (ref 70–99)
Glucose-Capillary: 173 mg/dL — ABNORMAL HIGH (ref 70–99)
Glucose-Capillary: 213 mg/dL — ABNORMAL HIGH (ref 70–99)
Glucose-Capillary: 336 mg/dL — ABNORMAL HIGH (ref 70–99)

## 2018-03-26 LAB — POCT PREGNANCY, URINE: Preg Test, Ur: NEGATIVE

## 2018-03-26 LAB — HEMOGLOBIN A1C
Hgb A1c MFr Bld: 7.6 % — ABNORMAL HIGH (ref 4.8–5.6)
Mean Plasma Glucose: 171 mg/dL

## 2018-03-26 SURGERY — ANTERIOR CERVICAL DECOMPRESSION/DISCECTOMY FUSION 1 LEVEL
Anesthesia: General | Site: Spine Cervical

## 2018-03-26 MED ORDER — INSULIN ASPART 100 UNIT/ML ~~LOC~~ SOLN
0.0000 [IU] | Freq: Three times a day (TID) | SUBCUTANEOUS | Status: DC
  Administered 2018-03-26: 15 [IU] via SUBCUTANEOUS
  Administered 2018-03-27: 4 [IU] via SUBCUTANEOUS

## 2018-03-26 MED ORDER — SODIUM CHLORIDE 0.9 % IV SOLN: INTRAVENOUS | Status: DC

## 2018-03-26 MED ORDER — MIDAZOLAM HCL 2 MG/2ML IJ SOLN
INTRAMUSCULAR | Status: AC
  Filled 2018-03-26: qty 2

## 2018-03-26 MED ORDER — SODIUM CHLORIDE 0.9 % IV SOLN: 250.0000 mL | INTRAVENOUS | Status: DC

## 2018-03-26 MED ORDER — ACETAMINOPHEN 650 MG RE SUPP: 650.0000 mg | RECTAL | Status: DC | PRN

## 2018-03-26 MED ORDER — SUGAMMADEX SODIUM 500 MG/5ML IV SOLN
INTRAVENOUS | Status: AC
  Filled 2018-03-26: qty 5

## 2018-03-26 MED ORDER — THROMBIN 5000 UNITS EX SOLR
CUTANEOUS | Status: AC
  Filled 2018-03-26: qty 10000

## 2018-03-26 MED ORDER — METHYLPREDNISOLONE SODIUM SUCC 125 MG IJ SOLR
125.0000 mg | Freq: Once | INTRAMUSCULAR | Status: DC
  Filled 2018-03-26: qty 2

## 2018-03-26 MED ORDER — LIDOCAINE-EPINEPHRINE 1 %-1:100000 IJ SOLN
INTRAMUSCULAR | Status: DC | PRN
  Administered 2018-03-26: 10 mL

## 2018-03-26 MED ORDER — LORAZEPAM 0.5 MG PO TABS: 1.0000 mg | ORAL_TABLET | Freq: Two times a day (BID) | ORAL | Status: DC | PRN

## 2018-03-26 MED ORDER — ALBUTEROL SULFATE (2.5 MG/3ML) 0.083% IN NEBU: 3.0000 mL | INHALATION_SOLUTION | Freq: Four times a day (QID) | RESPIRATORY_TRACT | Status: DC | PRN

## 2018-03-26 MED ORDER — CEFAZOLIN SODIUM-DEXTROSE 2-4 GM/100ML-% IV SOLN
2.0000 g | Freq: Three times a day (TID) | INTRAVENOUS | Status: AC
  Administered 2018-03-26 (×2): 2 g via INTRAVENOUS
  Filled 2018-03-26 (×2): qty 100

## 2018-03-26 MED ORDER — ROCURONIUM BROMIDE 50 MG/5ML IV SOSY
PREFILLED_SYRINGE | INTRAVENOUS | Status: AC
  Filled 2018-03-26: qty 10

## 2018-03-26 MED ORDER — CHLORHEXIDINE GLUCONATE CLOTH 2 % EX PADS: 6.0000 | MEDICATED_PAD | Freq: Once | CUTANEOUS | Status: DC

## 2018-03-26 MED ORDER — SODIUM CHLORIDE 0.9 % IV SOLN
INTRAVENOUS | Status: DC | PRN
  Administered 2018-03-26: 25 ug/min via INTRAVENOUS

## 2018-03-26 MED ORDER — MENTHOL 3 MG MT LOZG
1.0000 | LOZENGE | OROMUCOSAL | Status: DC | PRN
  Filled 2018-03-26: qty 9

## 2018-03-26 MED ORDER — LIDOCAINE 2% (20 MG/ML) 5 ML SYRINGE
INTRAMUSCULAR | Status: DC | PRN
  Administered 2018-03-26: 100 mg via INTRAVENOUS

## 2018-03-26 MED ORDER — PROPOFOL 10 MG/ML IV BOLUS
INTRAVENOUS | Status: AC
  Filled 2018-03-26: qty 20

## 2018-03-26 MED ORDER — LIDOCAINE 2% (20 MG/ML) 5 ML SYRINGE
INTRAMUSCULAR | Status: AC
  Filled 2018-03-26: qty 5

## 2018-03-26 MED ORDER — ACETAMINOPHEN 325 MG PO TABS
650.0000 mg | ORAL_TABLET | ORAL | Status: DC | PRN
  Administered 2018-03-27: 650 mg via ORAL
  Filled 2018-03-26: qty 2

## 2018-03-26 MED ORDER — OXYBUTYNIN CHLORIDE ER 10 MG PO TB24
10.0000 mg | ORAL_TABLET | Freq: Every day | ORAL | Status: DC
  Administered 2018-03-26: 10 mg via ORAL
  Filled 2018-03-26 (×2): qty 1

## 2018-03-26 MED ORDER — ONDANSETRON HCL 4 MG/2ML IJ SOLN: 4.0000 mg | Freq: Four times a day (QID) | INTRAMUSCULAR | Status: DC | PRN

## 2018-03-26 MED ORDER — SUCCINYLCHOLINE CHLORIDE 20 MG/ML IJ SOLN
INTRAMUSCULAR | Status: DC | PRN
  Administered 2018-03-26: 40 mg via INTRAVENOUS
  Administered 2018-03-26: 100 mg via INTRAVENOUS

## 2018-03-26 MED ORDER — SODIUM CHLORIDE 0.9% FLUSH
3.0000 mL | Freq: Two times a day (BID) | INTRAVENOUS | Status: DC
  Administered 2018-03-26: 3 mL via INTRAVENOUS

## 2018-03-26 MED ORDER — OXYCODONE HCL 5 MG PO TABS
10.0000 mg | ORAL_TABLET | ORAL | Status: DC | PRN
  Administered 2018-03-26 (×2): 10 mg via ORAL
  Filled 2018-03-26 (×3): qty 2

## 2018-03-26 MED ORDER — ONDANSETRON HCL 4 MG/2ML IJ SOLN
INTRAMUSCULAR | Status: DC | PRN
  Administered 2018-03-26: 4 mg via INTRAVENOUS

## 2018-03-26 MED ORDER — DEXAMETHASONE SODIUM PHOSPHATE 10 MG/ML IJ SOLN
INTRAMUSCULAR | Status: AC
  Filled 2018-03-26: qty 1

## 2018-03-26 MED ORDER — OXYCODONE HCL 5 MG PO TABS
5.0000 mg | ORAL_TABLET | ORAL | Status: DC | PRN
  Administered 2018-03-27 (×2): 5 mg via ORAL
  Filled 2018-03-26: qty 1

## 2018-03-26 MED ORDER — HEMOSTATIC AGENTS (NO CHARGE) OPTIME
TOPICAL | Status: DC | PRN
  Administered 2018-03-26: 1 via TOPICAL

## 2018-03-26 MED ORDER — LEVOTHYROXINE SODIUM 75 MCG PO TABS
150.0000 ug | ORAL_TABLET | Freq: Every day | ORAL | Status: DC
  Administered 2018-03-27: 150 ug via ORAL
  Filled 2018-03-26: qty 2

## 2018-03-26 MED ORDER — PHENYLEPHRINE 40 MCG/ML (10ML) SYRINGE FOR IV PUSH (FOR BLOOD PRESSURE SUPPORT)
PREFILLED_SYRINGE | INTRAVENOUS | Status: AC
  Filled 2018-03-26: qty 10

## 2018-03-26 MED ORDER — FENTANYL CITRATE (PF) 100 MCG/2ML IJ SOLN
INTRAMUSCULAR | Status: DC | PRN
  Administered 2018-03-26: 50 ug via INTRAVENOUS
  Administered 2018-03-26: 25 ug via INTRAVENOUS
  Administered 2018-03-26: 50 ug via INTRAVENOUS
  Administered 2018-03-26: 100 ug via INTRAVENOUS
  Administered 2018-03-26: 25 ug via INTRAVENOUS
  Administered 2018-03-26 (×2): 50 ug via INTRAVENOUS

## 2018-03-26 MED ORDER — METFORMIN HCL ER 500 MG PO TB24
1000.0000 mg | ORAL_TABLET | Freq: Two times a day (BID) | ORAL | Status: DC
  Administered 2018-03-26 – 2018-03-27 (×2): 1000 mg via ORAL
  Filled 2018-03-26 (×2): qty 2

## 2018-03-26 MED ORDER — ATORVASTATIN CALCIUM 20 MG PO TABS
10.0000 mg | ORAL_TABLET | Freq: Every day | ORAL | Status: DC
  Administered 2018-03-26: 10 mg via ORAL
  Filled 2018-03-26: qty 1

## 2018-03-26 MED ORDER — ROCURONIUM BROMIDE 50 MG/5ML IV SOSY
PREFILLED_SYRINGE | INTRAVENOUS | Status: DC | PRN
  Administered 2018-03-26 (×2): 50 mg via INTRAVENOUS
  Administered 2018-03-26: 20 mg via INTRAVENOUS
  Administered 2018-03-26: 30 mg via INTRAVENOUS

## 2018-03-26 MED ORDER — MIRTAZAPINE 30 MG PO TABS
60.0000 mg | ORAL_TABLET | Freq: Every day | ORAL | Status: DC
  Administered 2018-03-26: 60 mg via ORAL
  Filled 2018-03-26: qty 2

## 2018-03-26 MED ORDER — ONDANSETRON HCL 4 MG/2ML IJ SOLN
INTRAMUSCULAR | Status: AC
  Filled 2018-03-26: qty 2

## 2018-03-26 MED ORDER — FENTANYL CITRATE (PF) 250 MCG/5ML IJ SOLN
INTRAMUSCULAR | Status: AC
  Filled 2018-03-26: qty 5

## 2018-03-26 MED ORDER — MIDAZOLAM HCL 5 MG/5ML IJ SOLN
INTRAMUSCULAR | Status: DC | PRN
  Administered 2018-03-26: 2 mg via INTRAVENOUS

## 2018-03-26 MED ORDER — PROPOFOL 10 MG/ML IV BOLUS
INTRAVENOUS | Status: DC | PRN
  Administered 2018-03-26: 200 mg via INTRAVENOUS

## 2018-03-26 MED ORDER — PHENOL 1.4 % MT LIQD: 1.0000 | OROMUCOSAL | Status: DC | PRN

## 2018-03-26 MED ORDER — METHYLPREDNISOLONE SODIUM SUCC 125 MG IJ SOLR
INTRAMUSCULAR | Status: DC | PRN
  Administered 2018-03-26: 125 mg via INTRAVENOUS

## 2018-03-26 MED ORDER — DOCUSATE SODIUM 100 MG PO CAPS
100.0000 mg | ORAL_CAPSULE | Freq: Two times a day (BID) | ORAL | Status: DC
  Administered 2018-03-26 – 2018-03-27 (×3): 100 mg via ORAL
  Filled 2018-03-26 (×3): qty 1

## 2018-03-26 MED ORDER — DULOXETINE HCL 30 MG PO CPEP
60.0000 mg | ORAL_CAPSULE | Freq: Two times a day (BID) | ORAL | Status: DC
  Administered 2018-03-26: 60 mg via ORAL
  Filled 2018-03-26: qty 2

## 2018-03-26 MED ORDER — SUGAMMADEX SODIUM 500 MG/5ML IV SOLN
INTRAVENOUS | Status: DC | PRN
  Administered 2018-03-26: 400 mg via INTRAVENOUS

## 2018-03-26 MED ORDER — SODIUM CHLORIDE 0.9% FLUSH: 3.0000 mL | INTRAVENOUS | Status: DC | PRN

## 2018-03-26 MED ORDER — SODIUM CHLORIDE 0.9 % IV SOLN
INTRAVENOUS | Status: DC | PRN
  Administered 2018-03-26: 500 mL

## 2018-03-26 MED ORDER — ONDANSETRON HCL 4 MG PO TABS: 4.0000 mg | ORAL_TABLET | Freq: Four times a day (QID) | ORAL | Status: DC | PRN

## 2018-03-26 MED ORDER — SUCCINYLCHOLINE CHLORIDE 200 MG/10ML IV SOSY
PREFILLED_SYRINGE | INTRAVENOUS | Status: AC
  Filled 2018-03-26: qty 10

## 2018-03-26 MED ORDER — FENTANYL CITRATE (PF) 100 MCG/2ML IJ SOLN: 25.0000 ug | INTRAMUSCULAR | Status: DC | PRN

## 2018-03-26 MED ORDER — LISINOPRIL 20 MG PO TABS
20.0000 mg | ORAL_TABLET | Freq: Every day | ORAL | Status: DC
  Administered 2018-03-26: 20 mg via ORAL
  Filled 2018-03-26: qty 1

## 2018-03-26 MED ORDER — FUROSEMIDE 40 MG PO TABS
40.0000 mg | ORAL_TABLET | Freq: Every day | ORAL | Status: DC
  Administered 2018-03-26: 40 mg via ORAL
  Filled 2018-03-26: qty 1

## 2018-03-26 MED ORDER — LIDOCAINE-EPINEPHRINE 1 %-1:100000 IJ SOLN
INTRAMUSCULAR | Status: AC
  Filled 2018-03-26: qty 1

## 2018-03-26 MED ORDER — LACTATED RINGERS IV SOLN
INTRAVENOUS | Status: DC | PRN
  Administered 2018-03-26 (×2): via INTRAVENOUS

## 2018-03-26 MED ORDER — 0.9 % SODIUM CHLORIDE (POUR BTL) OPTIME
TOPICAL | Status: DC | PRN
  Administered 2018-03-26: 1000 mL

## 2018-03-26 MED ORDER — ALUM & MAG HYDROXIDE-SIMETH 200-200-20 MG/5ML PO SUSP: 30.0000 mL | ORAL | Status: DC | PRN

## 2018-03-26 MED ORDER — LITHIUM CARBONATE ER 450 MG PO TBCR
450.0000 mg | EXTENDED_RELEASE_TABLET | Freq: Every day | ORAL | Status: DC
  Administered 2018-03-26: 450 mg via ORAL
  Filled 2018-03-26: qty 1

## 2018-03-26 MED ORDER — GLYCOPYRROLATE 0.2 MG/ML IJ SOLN
INTRAMUSCULAR | Status: DC | PRN
  Administered 2018-03-26: 0.2 mg via INTRAVENOUS

## 2018-03-26 MED ORDER — CYCLOBENZAPRINE HCL 10 MG PO TABS
10.0000 mg | ORAL_TABLET | Freq: Three times a day (TID) | ORAL | Status: DC | PRN
  Administered 2018-03-26: 10 mg via ORAL
  Filled 2018-03-26: qty 1

## 2018-03-26 MED ORDER — INSULIN ASPART 100 UNIT/ML ~~LOC~~ SOLN: 0.0000 [IU] | Freq: Every day | SUBCUTANEOUS | Status: DC

## 2018-03-26 MED ORDER — ZOLPIDEM TARTRATE 5 MG PO TABS: 5.0000 mg | ORAL_TABLET | Freq: Every evening | ORAL | Status: DC | PRN

## 2018-03-26 MED ORDER — ROCURONIUM BROMIDE 50 MG/5ML IV SOSY
PREFILLED_SYRINGE | INTRAVENOUS | Status: AC
  Filled 2018-03-26: qty 15

## 2018-03-26 MED ORDER — DEXMEDETOMIDINE HCL 200 MCG/2ML IV SOLN
INTRAVENOUS | Status: DC | PRN
  Administered 2018-03-26: 8 ug via INTRAVENOUS

## 2018-03-26 SURGICAL SUPPLY — 61 items
ADH SKN CLS APL DERMABOND .7 (GAUZE/BANDAGES/DRESSINGS) ×1
APL SKNCLS STERI-STRIP NONHPOA (GAUZE/BANDAGES/DRESSINGS)
BAG DECANTER FOR FLEXI CONT (MISCELLANEOUS) ×2 IMPLANT
BENZOIN TINCTURE PRP APPL 2/3 (GAUZE/BANDAGES/DRESSINGS) IMPLANT
BLADE CLIPPER SURG (BLADE) IMPLANT
BLADE SURG 15 STRL LF DISP TIS (BLADE) ×2 IMPLANT
BLADE SURG 15 STRL SS (BLADE)
BUR MATCHSTICK NEURO 3.0 LAGG (BURR) ×2 IMPLANT
CANISTER SUCT 3000ML PPV (MISCELLANEOUS) ×2 IMPLANT
DECANTER SPIKE VIAL GLASS SM (MISCELLANEOUS) ×2 IMPLANT
DERMABOND ADVANCED (GAUZE/BANDAGES/DRESSINGS) ×1
DERMABOND ADVANCED .7 DNX12 (GAUZE/BANDAGES/DRESSINGS) ×1 IMPLANT
DRAPE C-ARM 42X72 X-RAY (DRAPES) ×4 IMPLANT
DRAPE HALF SHEET 40X57 (DRAPES) ×1 IMPLANT
DRAPE LAPAROTOMY 100X72 PEDS (DRAPES) ×2 IMPLANT
DRAPE MICROSCOPE LEICA (MISCELLANEOUS) ×2 IMPLANT
DURAPREP 6ML APPLICATOR 50/CS (WOUND CARE) ×2 IMPLANT
ELECT COATED BLADE 2.86 ST (ELECTRODE) ×2 IMPLANT
ELECT REM PT RETURN 9FT ADLT (ELECTROSURGICAL) ×2
ELECTRODE REM PT RTRN 9FT ADLT (ELECTROSURGICAL) ×1 IMPLANT
FLOSEAL 5ML (HEMOSTASIS) ×2 IMPLANT
GAUZE 4X4 16PLY RFD (DISPOSABLE) IMPLANT
GAUZE SPONGE 4X4 16PLY XRAY LF (GAUZE/BANDAGES/DRESSINGS) ×1 IMPLANT
GLOVE BIO SURGEON STRL SZ7.5 (GLOVE) ×3 IMPLANT
GLOVE BIOGEL PI IND STRL 7.0 (GLOVE) IMPLANT
GLOVE BIOGEL PI IND STRL 7.5 (GLOVE) ×2 IMPLANT
GLOVE BIOGEL PI IND STRL 8 (GLOVE) IMPLANT
GLOVE BIOGEL PI INDICATOR 7.0 (GLOVE) ×1
GLOVE BIOGEL PI INDICATOR 7.5 (GLOVE) ×3
GLOVE BIOGEL PI INDICATOR 8 (GLOVE) ×4
GLOVE EXAM NITRILE LRG STRL (GLOVE) IMPLANT
GLOVE EXAM NITRILE XL STR (GLOVE) IMPLANT
GLOVE EXAM NITRILE XS STR PU (GLOVE) IMPLANT
GOWN STRL REUS W/ TWL LRG LVL3 (GOWN DISPOSABLE) ×2 IMPLANT
GOWN STRL REUS W/ TWL XL LVL3 (GOWN DISPOSABLE) IMPLANT
GOWN STRL REUS W/TWL 2XL LVL3 (GOWN DISPOSABLE) ×1 IMPLANT
GOWN STRL REUS W/TWL LRG LVL3 (GOWN DISPOSABLE) ×4
GOWN STRL REUS W/TWL XL LVL3 (GOWN DISPOSABLE)
KIT BASIN OR (CUSTOM PROCEDURE TRAY) ×2 IMPLANT
KIT TURNOVER KIT B (KITS) ×2 IMPLANT
NDL SPNL 22GX3.5 QUINCKE BK (NEEDLE) ×2 IMPLANT
NEEDLE HYPO 22GX1.5 SAFETY (NEEDLE) ×2 IMPLANT
NEEDLE SPNL 22GX3.5 QUINCKE BK (NEEDLE) ×6 IMPLANT
NS IRRIG 1000ML POUR BTL (IV SOLUTION) ×2 IMPLANT
PACK LAMINECTOMY NEURO (CUSTOM PROCEDURE TRAY) ×2 IMPLANT
PAD ARMBOARD 7.5X6 YLW CONV (MISCELLANEOUS) ×6 IMPLANT
PASTE BONE GRAFTON 1CC (Bone Implant) ×1 IMPLANT
PIN DISTRACTION 14MM (PIN) ×4 IMPLANT
PLATE ELITE 21MM (Plate) ×1 IMPLANT
RUBBERBAND STERILE (MISCELLANEOUS) ×4 IMPLANT
SCREW SELF TAP VAR 4.0X13 (Screw) ×4 IMPLANT
SPACER BONE CORNERSTONE 5X14 (Orthopedic Implant) ×1 IMPLANT
SPONGE INTESTINAL PEANUT (DISPOSABLE) ×3 IMPLANT
SPONGE SURGIFOAM ABS GEL SZ50 (HEMOSTASIS) IMPLANT
STAPLER VISISTAT 35W (STAPLE) IMPLANT
SUT MNCRL AB 3-0 PS2 18 (SUTURE) ×2 IMPLANT
SUT VIC AB 3-0 SH 8-18 (SUTURE) ×3 IMPLANT
TAPE CLOTH 3X10 TAN LF (GAUZE/BANDAGES/DRESSINGS) ×2 IMPLANT
TOWEL GREEN STERILE (TOWEL DISPOSABLE) ×2 IMPLANT
TOWEL GREEN STERILE FF (TOWEL DISPOSABLE) ×2 IMPLANT
WATER STERILE IRR 1000ML POUR (IV SOLUTION) ×2 IMPLANT

## 2018-03-26 NOTE — Progress Notes (Signed)
Neurosurgery Service Post-operative progress note  Assessment & Plan: 52 y.o. woman s/p C7-T1 ACDF, having some throat soreness but otherwise no complaints. Left arm pain and hand weakness are completely resolved from preop, incision c/d/i, no incisional fullness, recovering well. -obs overnight, likely discharge in AM  [facility] A Mort Smelser  02/21/18 5:08 PM

## 2018-03-26 NOTE — Brief Op Note (Signed)
03/26/2018  11:11 AM  PATIENT:  [name]  52 y.o. female  PRE-OPERATIVE DIAGNOSIS:  Cervical radiculopathy  POST-OPERATIVE DIAGNOSIS:  Cervical radiculopathy  PROCEDURE:  Procedure(s): Cervical seven Thoracic one Anterior cervical decompression/discectomy/fusion (N/A)  SURGEON:  Surgeon(s) and Role:    * Judith Part, MD - Primary    * Consuella Lose, MD - Assisting   ANESTHESIA:   general  EBL:  100 mL   BLOOD ADMINISTERED:none  DRAINS: none   LOCAL MEDICATIONS USED:  LIDOCAINE   SPECIMEN:  No Specimen  DISPOSITION OF SPECIMEN:  N/A  COUNTS:  YES  TOURNIQUET:  * No tourniquets in log *  DICTATION: .Note written in EPIC  PLAN OF CARE: Admit for overnight observation  PATIENT DISPOSITION:  PACU - hemodynamically stable.   Delay start of Pharmacological VTE agent (>24hrs) due to surgical blood loss or risk of bleeding: yes

## 2018-03-26 NOTE — Op Note (Signed)
PATIENT: [name]  DATE:03/26/18   PRE-OPERATIVE DIAGNOSIS:  Cervical radiculopathy   POST-OPERATIVE DIAGNOSIS:  Cervical radiculopathy   PROCEDURE:  C7-T1 Anterior Cervical Discectomy and Fusion with anterior plating and cortical allograft   SURGEON:  Surgeon(s) and Role:    Judith Part, MD - Primary    Marco Collie, MD - Assisting   ANESTHESIA: ETGA   BRIEF HISTORY: This is a 52 year old woman who presented with left 4th and 5th digit numbness, intrinsic left hand weakness, and pain in the left neck, arm, and radiating into the left 4th and 5th digits. She previously had ulnar decompression performed by another physician without improvement in her symptoms, so a cervical MRI was obtained. The patient was found to have a C7-T1 disc herniation causing severe foraminal stenosis that was likely causing her symptoms . This was discussed with the patient as well as risks, benefits, and alternatives and the patient wished to proceed with surgical evacuation. In addition to the standard risks of this type of surgery, we also discussed that the patient's BMI of 56 places her in the category of morbidly obese, which increases the risk of post-surgical complications, increases the difficulty of surgical exposure, and makes intraoperative fluoroscopic images significantly more difficult to interpret. While her symptoms are consistent with her spinal pathology, I also counseled her that she could have components of both cervical radiculopathy and a peripheral neuropathy, the latter of which may still require future surgical treatment.   OPERATIVE DETAIL: The patient was taken to the operating room and placed on the OR table in the supine position. A formal time out was performed with two patient identifiers and confirmed the operative site. Anesthesia was induced by the anesthesia team. Of note, the patient had a difficult airway that required use of the glide scope, however the  anesthesia team was able to intubate her without issue. Fluoroscopy was used to localize a skin crease that corresponded to the C7-T1 disc space, however structural landmarks had to be used primarily. The operative site was marked, hair was clipped with surgical clippers, the area was then prepped and draped in a sterile fashion. A transverse linear incision was placed on the right side of the neck and a standard anterior cervical dissection was performed to the anterior vertebral bodies. Given the patient's habitus, it was not possible to localize the C7-T1 disc space directly on fluoroscopy, despite multiple techniques to optimize positioning and image quality. Dissection was therefore performed up to the most cranial visible level, C4-C5. This disc space was confirmed on fluoroscopy using a spinal needle with a protective bend in it. The caudal levels were then identified by placing a spinal needle in the disc space at C5-C6, C6-C7, and finally C7-T1. An annulotomy and then discectomy was performed at C7-T1 with resection of the posterior longitudinal ligament. The left C7-T1 foramen was severely stenotic and a foraminotomy was performed until the stenosis was resolved. There was mild right sided foraminal stenosis that was similarly treated. Hemostasis was obtained and trial spacers were used to select a 72mm interbody cortical allograft ([employer]). This graft was filled with demineralized bone matrix ([employer]) and inserted to the disc space. Due to the inability to visualize the graft on lateral fluoroscopy, a nerve hook was used to palpate dorsal to the graft to assure sufficient space between the thecal sac and the posterior aspect of the graft. An anterior cervical plate was placed with two 61mm screws placed in the C7 vertebral body  and two 70mm screws placed in the T1 vertebral body. An attempt was again made to confirm the screw position with lateral fluoroscopy, however this was not technically  possible due to the patient's size. Hemostasis was again assured and the incision was copiously irrigated with antibiotic solution then closed in layers. All instrument and sponge counts were correct, the incision was then closed in layers. The patient was then returned to anesthesia for emergence. No apparent complications at the completion of the procedure.   EBL:  165mL   DRAINS: none   SPECIMENS: none   Judith Part, MD 03/26/18 1:21 PM

## 2018-03-26 NOTE — Anesthesia Preprocedure Evaluation (Addendum)
Anesthesia Evaluation  Patient identified by MRN, date of birth, ID band Patient awake    Reviewed: Allergy & Precautions, NPO status   Airway Mallampati: III       Dental  (+) Teeth Intact, Loose, Dental Advisory Given,    Pulmonary asthma , sleep apnea ,    breath sounds clear to auscultation       Cardiovascular hypertension, + angina  Rhythm:Regular     Neuro/Psych  Headaches,    GI/Hepatic GERD  ,  Endo/Other  diabetesHypothyroidism   Renal/GU      Musculoskeletal  (+) Fibromyalgia -  Abdominal (+)  Abdomen: soft. Bowel sounds: normal.  Peds  Hematology  (+) anemia ,   Anesthesia Other Findings   Reproductive/Obstetrics                           Anesthesia Physical Anesthesia Plan  ASA: III  Anesthesia Plan: General   Post-op Pain Management:    Induction:   PONV Risk Score and Plan: 3 and Treatment may vary due to age or medical condition  Airway Management Planned:   Additional Equipment:   Intra-op Plan:   Post-operative Plan: Possible Post-op intubation/ventilation  Informed Consent: I have reviewed the patients History and Physical, chart, labs and discussed the procedure including the risks, benefits and alternatives for the proposed anesthesia with the patient or authorized representative who has indicated his/her understanding and acceptance.   Dental advisory given  Plan Discussed with: CRNA and Anesthesiologist  Anesthesia Plan Comments:        Anesthesia Quick Evaluation

## 2018-03-26 NOTE — Anesthesia Postprocedure Evaluation (Signed)
Anesthesia Post Note  Patient: [name]  Procedure(s) Performed: Cervical seven Thoracic one Anterior cervical decompression/discectomy/fusion (N/A Spine Cervical)     Patient location during evaluation: PACU Anesthesia Type: General Level of consciousness: awake Pain management: pain level controlled Vital Signs Assessment: post-procedure vital signs reviewed and stable Respiratory status: spontaneous breathing Cardiovascular status: stable Postop Assessment: no apparent nausea or vomiting Anesthetic complications: no    Last Vitals:  Vitals:   03/26/18 1300 03/26/18 1557  BP: (!) 135/96 (!) 136/94  Pulse: (!) 126 (!) 134  Resp: 18 20  Temp: (!) 36.3 C   SpO2: 93% 98%    Last Pain:  Vitals:   03/26/18 1300  TempSrc: Oral  PainSc:                  Quentez Lober

## 2018-03-26 NOTE — Anesthesia Procedure Notes (Addendum)
Procedure Name: Intubation Date/Time: 03/26/2018 7:40 AM Performed by: Lavell Luster, CRNA Pre-anesthesia Checklist: Patient identified, Emergency Drugs available, Suction available, Patient being monitored and Timeout performed Patient Re-evaluated:Patient Re-evaluated prior to induction Oxygen Delivery Method: Circle system utilized Preoxygenation: Pre-oxygenation with 100% oxygen Induction Type: IV induction Ventilation: Mask ventilation with difficulty Laryngoscope Size: Mac, 4, Glidescope and 3 Grade View: Grade III Tube type: Oral Tube size: 7.0 mm Number of attempts: 3 Airway Equipment and Method: Stylet and Video-laryngoscopy Placement Confirmation: ETT inserted through vocal cords under direct vision,  positive ETCO2 and breath sounds checked- equal and bilateral Secured at: 21 cm Tube secured with: Tape Dental Injury: Teeth and Oropharynx as per pre-operative assessment and Bloody posterior oropharynx  Difficulty Due To: Difficulty was anticipated, Difficult Airway- due to anterior larynx, Difficult Airway-  due to neck instability and Difficult Airway- due to reduced neck mobility Future Recommendations: Recommend- induction with short-acting agent, and alternative techniques readily available Comments: DL x 3 with video-laryngoscope, view of posterior cords each time but difficulty passing ETT due to anterior larynx.  Pt sats dropped to 77%, recruited with PPV ( oral airway and two handed mask noted) and attempted intubation again.  Will check air leak prior to extubation, administering Solumedrol after discussing with PharmD and  Dr Nyoka Cowden.  Henderson Cloud, CRNA

## 2018-03-26 NOTE — Progress Notes (Signed)
Orthopedic Tech Progress Note Patient Details:  [name] [birth year] [id number]  Ortho Devices Type of Ortho Device: Soft collar Ortho Device/Splint Location: neck Ortho Device/Splint Interventions: Application   Post Interventions Patient Tolerated: Well Instructions Provided: Care of device   Hildred Priest 03/26/2018, 12:44 PM
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
## 2018-03-26 NOTE — Transfer of Care (Signed)
Immediate Anesthesia Transfer of Care Note  Patient: [name]  Procedure(s) Performed: Cervical seven Thoracic one Anterior cervical decompression/discectomy/fusion (N/A Spine Cervical)  Patient Location: PACU  Anesthesia Type:General  Level of Consciousness: awake, alert , oriented and patient cooperative  Airway & Oxygen Therapy: Patient connected to face mask oxygen  Post-op Assessment: Post -op Vital signs reviewed and stable  Post vital signs: stable  Last Vitals:  Vitals Value Taken Time  BP 127/87 03/26/2018 11:26 AM  Temp    Pulse 139 03/26/2018 11:28 AM  Resp 24 03/26/2018 11:28 AM  SpO2 98 % 03/26/2018 11:28 AM  Vitals shown include unvalidated device data.  Last Pain:  Vitals:   03/26/18 0606  TempSrc: Oral  PainSc: 6       Patients Stated Pain Goal: 3 (54/88/30 1415)  Complications: No apparent anesthesia complications

## 2018-03-27 DIAGNOSIS — M5412 Radiculopathy, cervical region: Secondary | ICD-10-CM | POA: Diagnosis not present

## 2018-03-27 LAB — GLUCOSE, CAPILLARY: Glucose-Capillary: 164 mg/dL — ABNORMAL HIGH (ref 70–99)

## 2018-03-27 MED ORDER — OXYCODONE HCL 5 MG PO TABS: 5.0000 mg | ORAL_TABLET | ORAL | 0 refills | Status: DC | PRN

## 2018-03-27 NOTE — Progress Notes (Signed)
Physical Therapy Evaluation Patient Details Name: QUITA MCGRORY MRN: [id number] DOB: 1966-04-26 Today's Date: 03/27/2018   History of Present Illness  Pt is a 52 y.o. female s/p ACDF @ C7-T1. PMH significant for OP,PTSD, HTN,DM and Bipolar 1 disorder.  Clinical Impression  Patient is s/p above surgery resulting in deficits listed below (see PT Problem List). At time of evaluation pt performed ambulation with was gross min guard. Pt educated on positioning, car transfers, and generalized walking program. Pt reports this was her second spinal surgery and aware of precautions, but still required cueing throughout session. Will continue to follow acutely while admitted to maximize her independence and safety with mobility to allow discharge to the venue listed below.       Follow Up Recommendations No PT follow up;Supervision for mobility/OOB    Equipment Recommendations  None recommended by PT    Recommendations for Other Services       Precautions / Restrictions Precautions Precautions: Cervical Precaution Booklet Issued: Yes (comment) Precaution Comments: Reviewed precautions in full with pt  Required Braces or Orthoses: Cervical Brace Cervical Brace: Soft collar;For comfort Restrictions Weight Bearing Restrictions: No      Mobility  Bed Mobility Overal bed mobility: Needs Assistance Bed Mobility: Sit to Sidelying        Rolling: Min G Sit to sidelying: Min guard;HOB elevated  General bed mobility comments: Pt has an adjustable bed at home; was sleeping in recliner prior to surgery. VCs required for log roll sequence.   Transfers Overall transfer level: Needs assistance Equipment used: None Transfers: Sit to/from Stand Sit to Stand: Min guard;Supervision         General transfer comment: Increased time and effort noted. Min guard initially progressing to supervision. Min cues for safety and technique   Ambulation/Gait Ambulation/Gait assistance: Min  guard;Supervision Gait Distance (Feet): 350 Feet Assistive device: None Gait Pattern/deviations: Step-through pattern;Wide base of support Gait velocity: decreased Gait velocity interpretation: <1.31 ft/sec, indicative of household ambulator General Gait Details: Pt initally slow and guarded. Min cues required for forward gaze and maintenence of precautions throughout ambulation. Pt ambulated without need for assistance or support from wall/rails.  Stairs Stairs: Yes Stairs assistance: Min guard Stair Management: No rails;Step to pattern;Forwards;Backwards Number of Stairs: 1 General stair comments: Pt must navigate a curb at d/c. Ascended forwards with strong LE leading and descended backwards with weaker LE leading. Min cues for safety and technique.   Wheelchair Mobility    Modified Rankin (Stroke Patients Only)       Balance Overall balance assessment: Mild deficits observed, not formally tested                                           Pertinent Vitals/Pain Pain Assessment: Faces Faces Pain Scale: Hurts little more Pain Location: Neck Pain Descriptors / Indicators: Discomfort;Grimacing;Guarding Pain Intervention(s): Limited activity within patient's tolerance;Monitored during session;Repositioned    Home Living Family/patient expects to be discharged to:: Private residence Living Arrangements: Spouse/significant other Available Help at Discharge: Family;Other (Comment)(Available for most of the day) Type of Home: House Home Access: Other (comment)(curb)     Home Layout: One level Home Equipment: [place] - 4 wheels;Cane - single point;Grab bars - tub/shower;Bedside commode      Prior Function Level of Independence: Independent               Hand Dominance  Dominant Hand: Right    Extremity/Trunk Assessment   Upper Extremity Assessment Upper Extremity Assessment: Defer to OT evaluation    Lower Extremity Assessment Lower Extremity  Assessment: Overall WFL for tasks assessed    Cervical / Trunk Assessment Cervical / Trunk Assessment: Other exceptions Cervical / Trunk Exceptions: s/p cervical surgery  Communication   Communication: No difficulties  Cognition Arousal/Alertness: Awake/alert Behavior During Therapy: WFL for tasks assessed/performed Overall Cognitive Status: Within Functional Limits for tasks assessed                                        General Comments      Exercises     Assessment/Plan    PT Assessment Patient needs continued PT services  PT Problem List Decreased strength;Decreased range of motion;Decreased balance;Decreased activity tolerance;Decreased mobility;Decreased coordination;Decreased knowledge of use of DME;Decreased safety awareness;Decreased knowledge of precautions;Pain       PT Treatment Interventions Gait training;Stair training;Functional mobility training;Therapeutic activities;Patient/family education;Modalities    PT Goals (Current goals can be found in the Care Plan section)  Acute Rehab PT Goals Patient Stated Goal: to go home PT Goal Formulation: With patient Time For Goal Achievement: 04/03/18 Potential to Achieve Goals: Good    Frequency Min 5X/week   Barriers to discharge        Co-evaluation               AM-PAC PT "6 Clicks" Daily Activity  Outcome Measure Difficulty turning over in bed (including adjusting bedclothes, sheets and blankets)?: A Little Difficulty moving from lying on back to sitting on the side of the bed? : A Little Difficulty sitting down on and standing up from a chair with arms (e.g., wheelchair, bedside commode, etc,.)?: A Little Help needed moving to and from a bed to chair (including a wheelchair)?: A Little Help needed walking in hospital room?: A Little Help needed climbing 3-5 steps with a railing? : A Little 6 Click Score: 18    End of Session Equipment Utilized During Treatment: Gait belt;Cervical  collar Activity Tolerance: Patient tolerated treatment well Patient left: in chair;with call bell/phone within reach Nurse Communication: Mobility status PT Visit Diagnosis: Other abnormalities of gait and mobility (R26.89);Pain    Time: 6168-3729 PT Time Calculation (min) (ACUTE ONLY): 20 min   Charges:    [place], [place]  Student Physical Therapist Acute Rehab [phone]   Einar Crow 03/27/2018, 9:33 AM

## 2018-03-27 NOTE — Progress Notes (Signed)
Patient alert and oriented, mae's well, voiding adequate amount of urine, swallowing without difficulty, no c/o pain at time of discharge. Patient discharged home with family. Script and discharged instructions given to patient. Patient and family stated understanding of instructions given. Patient has an appointment with Dr. Ostergard   

## 2018-03-27 NOTE — Progress Notes (Signed)
Neurosurgery Service Progress Note  Subjective: No acute events overnight. Preoperative numbness, weakness, and pain are still resolved. No complaints, ready for discharge home this morning.  Objective: Vitals:   03/26/18 1557 03/26/18 1915 03/26/18 2323 03/27/18 0442  BP: (!) 136/94 128/78 118/77 (!) 130/91  Pulse: (!) 134 (!) 115 (!) 107 (!) 105  Resp: 20 20 20 20   Temp:  98.2 F (36.8 C) 99 F (37.2 C) 98.5 F (36.9 C)  TempSrc:  Oral Oral Oral  SpO2: 98% 94% (!) 87% 95%   Temp (24hrs), Avg:98.8 F (37.1 C), Min:97.4 F (36.3 C), Max:101.3 F (38.5 C)  CBC Latest Ref Rng & Units 03/25/2018 01/19/2016 07/06/2014  WBC 4.0 - 10.5 K/uL 11.6(H) 9.3 6.9  Hemoglobin 12.0 - 15.0 g/dL 14.9 12.4 13.4  Hematocrit 36.0 - 46.0 % 46.3(H) 36.3 42.1  Platelets 150 - 400 K/uL 292 244 253   BMP Latest Ref Rng & Units 03/25/2018 01/27/2016 01/19/2016  Glucose 70 - 99 mg/dL 178(H) 122(H) 102(H)  BUN 6 - 20 mg/dL 13 9 26(H)  Creatinine 0.44 - 1.00 mg/dL 0.78 0.73 0.97  Sodium 135 - 145 mmol/L 138 133(L) 134(L)  Potassium 3.5 - 5.1 mmol/L 4.1 4.3 4.8  Chloride 98 - 111 mmol/L 101 98(L) 101  CO2 22 - 32 mmol/L 26 30 25   Calcium 8.9 - 10.3 mg/dL 10.1 8.4(L) 9.2    Intake/Output Summary (Last 24 hours) at 03/27/2018 0644 Last data filed at 03/26/2018 1046 Gross per 24 hour  Intake 1400 ml  Output 100 ml  Net 1300 ml    Current Facility-Administered Medications:  .  0.9 %  sodium chloride infusion, 250 mL, Intravenous, Continuous, Jacklyne Baik A, MD .  0.9 %  sodium chloride infusion, , Intravenous, Continuous, Ijanae Macapagal, Joyice Faster, MD .  acetaminophen (TYLENOL) tablet 650 mg, 650 mg, Oral, Q4H PRN, 650 mg at 03/27/18 0423 **OR** acetaminophen (TYLENOL) suppository 650 mg, 650 mg, Rectal, Q4H PRN, Din Bookwalter A, MD .  albuterol (PROVENTIL) (2.5 MG/3ML) 0.083% nebulizer solution 3 mL, 3 mL, Inhalation, Q6H PRN, Judith Part, MD .  alum & mag hydroxide-simeth (MAALOX/MYLANTA)  200-200-20 MG/5ML suspension 30 mL, 30 mL, Oral, Q4H PRN, Judith Part, MD .  atorvastatin (LIPITOR) tablet 10 mg, 10 mg, Oral, QHS, Johnchristopher Sarvis, Joyice Faster, MD, 10 mg at 03/26/18 2015 .  cyclobenzaprine (FLEXERIL) tablet 10 mg, 10 mg, Oral, Q8H PRN, Judith Part, MD, 10 mg at 03/26/18 2015 .  docusate sodium (COLACE) capsule 100 mg, 100 mg, Oral, BID, Judith Part, MD, 100 mg at 03/26/18 2015 .  DULoxetine (CYMBALTA) DR capsule 60 mg, 60 mg, Oral, BID, Ivalene Platte, Joyice Faster, MD, 60 mg at 03/26/18 2015 .  furosemide (LASIX) tablet 40 mg, 40 mg, Oral, Daily, Aliviya Schoeller, Joyice Faster, MD, 40 mg at 03/26/18 1501 .  insulin aspart (novoLOG) injection 0-20 Units, 0-20 Units, Subcutaneous, TID WC, Judith Part, MD, 15 Units at 03/26/18 1720 .  insulin aspart (novoLOG) injection 0-5 Units, 0-5 Units, Subcutaneous, QHS, Carrera Kiesel A, MD .  levothyroxine (SYNTHROID, LEVOTHROID) tablet 150 mcg, 150 mcg, Oral, QAC breakfast, Judith Part, MD, 150 mcg at 03/27/18 0423 .  lisinopril (PRINIVIL,ZESTRIL) tablet 20 mg, 20 mg, Oral, Daily, Vontae Court A, MD, 20 mg at 03/26/18 1501 .  lithium carbonate (ESKALITH) CR tablet 450 mg, 450 mg, Oral, QHS, Dorsey Charette, Joyice Faster, MD, 450 mg at 03/26/18 2016 .  LORazepam (ATIVAN) tablet 1 mg, 1 mg, Oral, BID PRN, Emelda Brothers  A, MD .  menthol-cetylpyridinium (CEPACOL) lozenge 3 mg, 1 lozenge, Oral, PRN **OR** phenol (CHLORASEPTIC) mouth spray 1 spray, 1 spray, Mouth/Throat, PRN, Azure Budnick A, MD .  metFORMIN (GLUCOPHAGE-XR) 24 hr tablet 1,000 mg, 1,000 mg, Oral, BID WC, Kyndell Zeiser, Joyice Faster, MD, 1,000 mg at 03/26/18 1723 .  mirtazapine (REMERON) tablet 60 mg, 60 mg, Oral, QHS, Keaira Whitehurst, Joyice Faster, MD, 60 mg at 03/26/18 2016 .  ondansetron (ZOFRAN) tablet 4 mg, 4 mg, Oral, Q6H PRN **OR** ondansetron (ZOFRAN) injection 4 mg, 4 mg, Intravenous, Q6H PRN, Judith Part, MD .  oxybutynin (DITROPAN-XL) 24 hr tablet 10 mg, 10 mg,  Oral, Daily, Kriti Katayama A, MD, 10 mg at 03/26/18 1501 .  oxyCODONE (Oxy IR/ROXICODONE) immediate release tablet 10 mg, 10 mg, Oral, Q3H PRN, Judith Part, MD, 10 mg at 03/26/18 2016 .  oxyCODONE (Oxy IR/ROXICODONE) immediate release tablet 5 mg, 5 mg, Oral, Q3H PRN, Judith Part, MD, 5 mg at 03/27/18 0423 .  sodium chloride flush (NS) 0.9 % injection 3 mL, 3 mL, Intravenous, Q12H, Jozelynn Danielson, Joyice Faster, MD, 3 mL at 03/26/18 2022 .  sodium chloride flush (NS) 0.9 % injection 3 mL, 3 mL, Intravenous, PRN, Daiel Strohecker A, MD .  zolpidem (AMBIEN) tablet 5 mg, 5 mg, Oral, QHS PRN, Judith Part, MD   Physical Exam: AOx3, PERRL, EOMI, FS, TM, Strength 5/5 x4, SILTx4 Incision c/d/i  Assessment & Plan: 52 y.o. POD1 s/p C7-T1 ACDF, recovering well. -discharge home this morning  Judith Part  02/21/18 5:08 PM

## 2018-03-27 NOTE — Evaluation (Signed)
Occupational Therapy Evaluation Patient Details Name: [name] MRN: [id number] DOB: 1966/02/28 Today's Date: 03/27/2018    History of Present Illness pt is a 52 y.o. female s/p ACDF @ C7-T1. PMH significant for OP,PTSD, HTN, DM.   Clinical Impression   PTA Pt independent in ADL and mobility. 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 Cervical handout provided and reviewed adls in detail. Pt educated on: brace, set an alarm at night for medication,  correct bed positioning for sleeping, correct sequence for bed mobility (practiced with PT). We also talked about how that will affect her care giving duties and dog-care duties. Pt and husband verbalized understanding and Pt was able to get dressed with very minor assist from her husband for LB (donning underwear and pants - threading feet through initially). All education is complete and patient indicates understanding. Thank you for the opportunity to serve this patient.     Follow Up Recommendations  Supervision - Intermittent    Equipment Recommendations  None recommended by OT    Recommendations for Other Services       Precautions / Restrictions Precautions Precautions: Cervical Precaution Booklet Issued: Yes (comment) Precaution Comments: Reviewed precautions in full with pt  Required Braces or Orthoses: Cervical Brace Cervical Brace: Soft collar;For comfort Restrictions Weight Bearing Restrictions: No      Mobility Bed Mobility Overal bed mobility: Needs Assistance Bed Mobility: Sit to Sidelying;Rolling Rolling: Min guard       Sit to sidelying: Min guard;HOB elevated General bed mobility comments: Pt OOB in recliner at beginning and end of session  Transfers Overall transfer level: Needs assistance Equipment used: None Transfers: Sit to/from Stand Sit to Stand: Supervision         General transfer comment: good hand placement    Balance Overall  balance assessment: Mild deficits observed, not formally tested                                         ADL either performed or assessed with clinical judgement   ADL Overall ADL's : Modified independent                                       General ADL Comments: educated on compensatory techniques for oral care and other ADL     Vision Patient Visual Report: No change from baseline       Perception     Praxis      Pertinent Vitals/Pain Pain Assessment: Faces Faces Pain Scale: Hurts a little bit Pain Location: Neck Pain Descriptors / Indicators: Discomfort;Grimacing;Guarding Pain Intervention(s): Monitored during session;Other (comment)(throat spray)     Hand Dominance Right   Extremity/Trunk Assessment Upper Extremity Assessment Upper Extremity Assessment: Overall WFL for tasks assessed   Lower Extremity Assessment Lower Extremity Assessment: Defer to PT evaluation   Cervical / Trunk Assessment Cervical / Trunk Assessment: Other exceptions Cervical / Trunk Exceptions: s/p cervical surgery   Communication Communication Communication: No difficulties   Cognition Arousal/Alertness: Awake/alert Behavior During Therapy: WFL for tasks assessed/performed Overall Cognitive Status: Within Functional Limits for tasks assessed  General Comments  husband in at end of session - present for education    Exercises     Shoulder Instructions      Home Living Family/patient expects to be discharged to:: Private residence Living Arrangements: Spouse/significant other;Parent(caregiver for her MIL with dementia) Available Help at Discharge: Family;Other (Comment)(Available for most of the day) Type of Home: House Home Access: Other (comment)(curb)     Home Layout: One level     Bathroom Shower/Tub: Occupational psychologist: Standard Bathroom Accessibility: No   Home  Equipment: Walker - 4 wheels;Cane - single point;Grab bars - tub/shower;Bedside commode          Prior Functioning/Environment Level of Independence: Independent        Comments: 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        OT Problem List: Impaired balance (sitting and/or standing);Decreased knowledge of precautions;Obesity;Pain      OT Treatment/Interventions:      OT Goals(Current goals can be found in the care plan section) Acute Rehab OT Goals Patient Stated Goal: to go home to dogs OT Goal Formulation: With patient/family Time For Goal Achievement: 04/10/18 Potential to Achieve Goals: Good  OT Frequency:     Barriers to D/C:            Co-evaluation              AM-PAC PT "6 Clicks" Daily Activity     Outcome Measure Help from another person eating meals?: None Help from another person taking care of personal grooming?: None Help from another person toileting, which includes using toliet, bedpan, or urinal?: None Help from another person bathing (including washing, rinsing, drying)?: None Help from another person to put on and taking off regular upper body clothing?: None Help from another person to put on and taking off regular lower body clothing?: A Little 6 Click Score: 23   End of Session Equipment Utilized During Treatment: Cervical collar Nurse Communication: Mobility status  Activity Tolerance: Patient tolerated treatment well Patient left: in chair;with call bell/phone within reach;with family/visitor present  OT Visit Diagnosis: Other abnormalities of gait and mobility (R26.89);Pain Pain - part of body: (neck)                Time: 5110-2111 OT Time Calculation (min): 22 min Charges:  OT General Charges $OT Visit: 1 Visit OT Evaluation $OT Eval Low Complexity: [place] OTR/L Acute Rehabilitation Services Pager: [phone] Office: [place] 03/27/2018, 10:07 AM

## 2018-03-27 NOTE — Discharge Summary (Signed)
Discharge Summary  Date of Admission: 03/26/2018  Date of Discharge: 03/27/18  Attending Physician: Emelda Brothers, MD  Hospital Course: Patient was admitted following an uncomplicated H7-V8 ACDF. She was recovered in PACU and transferred to our spine center for overnight observation. Her hospital course was uncomplicated and she was discharged home on POD1. She will follow up in clinic with me in 2-3 weeks with repeat xrays.  Neurologic exam at discharge:  AOx3, PERRL, EOMI, FS, TM Strength 5/5 x4, SILTx4 Preoperative left hand weakness and numbness resolved  Judith Part, MD 03/27/18  6:42 AM

## 2018-03-28 ENCOUNTER — Encounter (HOSPITAL_COMMUNITY): Payer: Self-pay | Admitting: Neurological Surgery

## 2018-04-03 DIAGNOSIS — E039 Hypothyroidism, unspecified: Secondary | ICD-10-CM | POA: Diagnosis not present

## 2018-04-04 ENCOUNTER — Encounter (HOSPITAL_COMMUNITY): Payer: Self-pay | Admitting: Licensed Clinical Social Worker

## 2018-04-04 ENCOUNTER — Ambulatory Visit (HOSPITAL_COMMUNITY): Payer: Medicare HMO | Admitting: Licensed Clinical Social Worker

## 2018-04-04 DIAGNOSIS — F330 Major depressive disorder, recurrent, mild: Secondary | ICD-10-CM

## 2018-04-04 DIAGNOSIS — R69 Illness, unspecified: Secondary | ICD-10-CM | POA: Diagnosis not present

## 2018-04-04 NOTE — Progress Notes (Signed)
   THERAPIST PROGRESS NOTE  Session Time: 56:15PP-94:32XM  Participation Level: Active  Behavioral Response: NeatAlertEuthymic  Type of Therapy: Individual Therapy  Treatment Goals addressed: Improve Psychiatric Symptoms, Elevate Mood, Improve Unhelpful Thought Patterns, decrease anxiety, learn about diagnosis, healthy coping skills   Interventions: Motivational Interviewing, CBT, Grounding & Mindfulness Techniques  Summary: [name] is a 52  y.o. female who presents with Major Depressive Disorder, Recurrent Severe, without psychotic features, and Generalized Anxiety Disorder  Suicidal/Homicidal: No -without intent/plan  Therapist Response: [name] met with clinician for an individual session. [name] discussed her psychiatric symptoms and her current life events. [name] shared that her neck surgery was successful and that she feels very little pain and has movement from left to right and up and down. She reports she is also feeling much better emotionally. She has had time to recuperate from surgery, had a week away from her mother in law, and now she is able to have some independence again. [name] reports she took the day after last session to mourn her mother, but now she has made some decisions about what she wants to do for holidays, etc. Clinician noted the increase in future thinking, as well as some motivation to get her house in order.   Plan: Return again in 3-4 weeks  Diagnosis:     Axis I: Major Depressive Disorder, Recurrent Severe, without psychotic features, and Generalized Anxiety Disorder  [name], LCSW 04/04/2018

## 2018-04-05 DIAGNOSIS — Z01411 Encounter for gynecological examination (general) (routine) with abnormal findings: Secondary | ICD-10-CM | POA: Diagnosis not present

## 2018-04-05 DIAGNOSIS — Z9189 Other specified personal risk factors, not elsewhere classified: Secondary | ICD-10-CM | POA: Diagnosis not present

## 2018-04-05 DIAGNOSIS — L680 Hirsutism: Secondary | ICD-10-CM | POA: Diagnosis not present

## 2018-04-05 DIAGNOSIS — Z6841 Body Mass Index (BMI) 40.0 and over, adult: Secondary | ICD-10-CM | POA: Diagnosis not present

## 2018-04-05 DIAGNOSIS — E282 Polycystic ovarian syndrome: Secondary | ICD-10-CM | POA: Diagnosis not present

## 2018-04-05 DIAGNOSIS — Z8619 Personal history of other infectious and parasitic diseases: Secondary | ICD-10-CM | POA: Diagnosis not present

## 2018-04-11 ENCOUNTER — Ambulatory Visit (HOSPITAL_COMMUNITY): Payer: Medicare HMO | Admitting: Psychiatry

## 2018-04-11 ENCOUNTER — Encounter (HOSPITAL_COMMUNITY): Payer: Self-pay | Admitting: Psychiatry

## 2018-04-11 DIAGNOSIS — F332 Major depressive disorder, recurrent severe without psychotic features: Secondary | ICD-10-CM | POA: Diagnosis not present

## 2018-04-11 DIAGNOSIS — F411 Generalized anxiety disorder: Secondary | ICD-10-CM | POA: Diagnosis not present

## 2018-04-11 DIAGNOSIS — F99 Mental disorder, not otherwise specified: Secondary | ICD-10-CM

## 2018-04-11 DIAGNOSIS — F5105 Insomnia due to other mental disorder: Secondary | ICD-10-CM | POA: Diagnosis not present

## 2018-04-11 DIAGNOSIS — R69 Illness, unspecified: Secondary | ICD-10-CM | POA: Diagnosis not present

## 2018-04-11 MED ORDER — MIRTAZAPINE 30 MG PO TABS: 60.0000 mg | ORAL_TABLET | Freq: Every day | ORAL | 1 refills | Status: DC

## 2018-04-11 MED ORDER — DULOXETINE HCL 60 MG PO CPEP: 60.0000 mg | ORAL_CAPSULE | Freq: Two times a day (BID) | ORAL | 1 refills | Status: DC

## 2018-04-11 MED ORDER — LORAZEPAM 1 MG PO TABS: 1.0000 mg | ORAL_TABLET | Freq: Two times a day (BID) | ORAL | 1 refills | Status: DC | PRN

## 2018-04-11 MED ORDER — ESZOPICLONE 3 MG PO TABS: 3.0000 mg | ORAL_TABLET | Freq: Every evening | ORAL | 1 refills | Status: DC | PRN

## 2018-04-11 MED ORDER — LITHIUM CARBONATE ER 300 MG PO TBCR: 600.0000 mg | EXTENDED_RELEASE_TABLET | Freq: Every day | ORAL | 1 refills | Status: DC
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
## 2018-04-17 DIAGNOSIS — M5412 Radiculopathy, cervical region: Secondary | ICD-10-CM | POA: Diagnosis not present

## 2018-04-22 ENCOUNTER — Encounter (HOSPITAL_COMMUNITY): Payer: Self-pay | Admitting: Licensed Clinical Social Worker

## 2018-04-22 ENCOUNTER — Ambulatory Visit (INDEPENDENT_AMBULATORY_CARE_PROVIDER_SITE_OTHER): Payer: Medicare HMO | Admitting: Licensed Clinical Social Worker

## 2018-04-22 DIAGNOSIS — F332 Major depressive disorder, recurrent severe without psychotic features: Secondary | ICD-10-CM | POA: Diagnosis not present

## 2018-04-22 DIAGNOSIS — F411 Generalized anxiety disorder: Secondary | ICD-10-CM | POA: Diagnosis not present

## 2018-04-22 DIAGNOSIS — R69 Illness, unspecified: Secondary | ICD-10-CM | POA: Diagnosis not present

## 2018-04-22 NOTE — Progress Notes (Signed)
   THERAPIST PROGRESS NOTE  Session Time: 12:40pm-1:30pm  Participation Level: Active  Behavioral Response: NeatAlertDepressed  Type of Therapy: Individual Therapy  Treatment Goals addressed: Improve Psychiatric Symptoms, Elevate Mood, Improve Unhelpful Thought Patterns, decrease anxiety, learn about diagnosis, healthy coping skills   Interventions: Motivational Interviewing, CBT, Grounding & Mindfulness Techniques  Summary: [name] is a 52  y.o. female who presents with Major Depressive Disorder, Recurrent Severe, without psychotic features, and Generalized Anxiety Disorder  Suicidal/Homicidal: No -without intent/plan  Therapist Response: [name] met with clinician for an individual session. [name] discussed her psychiatric symptoms and her current life events. [name] shared that she continues to feel depressed and maintains that her grief over the loss of her mother will never subside. Clinician explored any changes in the grief and any motivation to move ahead in her grief. [name] reports she does not think her grief will ever go away and she does not feel ready to let go. Clinician utilized CBT to explore the secondary gains of grief in her mind. Clinician also discussed ways to channel grief into something productive. [name] reports she did meet up with some former coworkers who participate in a sewing group at the [facility]. She reports she will start going there weekly and also start working out and swimming there. [name] also identified that she would look into the grief group there as well. Clinician explored interest in IOP group. [name] reports she will think about it.   Plan: Return again in 2-3 weeks  Diagnosis:     Axis I: Major Depressive Disorder, Recurrent Severe, without psychotic features, and Generalized Anxiety Disorder   [name], LCSW 04/22/2018

## 2018-04-26 DIAGNOSIS — M79671 Pain in right foot: Secondary | ICD-10-CM | POA: Diagnosis not present

## 2018-04-26 DIAGNOSIS — S92351A Displaced fracture of fifth metatarsal bone, right foot, initial encounter for closed fracture: Secondary | ICD-10-CM | POA: Diagnosis not present

## 2018-05-03 DIAGNOSIS — S92354D Nondisplaced fracture of fifth metatarsal bone, right foot, subsequent encounter for fracture with routine healing: Secondary | ICD-10-CM | POA: Diagnosis not present

## 2018-05-03 DIAGNOSIS — M79671 Pain in right foot: Secondary | ICD-10-CM | POA: Diagnosis not present

## 2018-05-06 ENCOUNTER — Encounter (HOSPITAL_COMMUNITY): Payer: Self-pay | Admitting: Licensed Clinical Social Worker

## 2018-05-06 ENCOUNTER — Ambulatory Visit (INDEPENDENT_AMBULATORY_CARE_PROVIDER_SITE_OTHER): Payer: Medicare HMO | Admitting: Licensed Clinical Social Worker

## 2018-05-06 DIAGNOSIS — F331 Major depressive disorder, recurrent, moderate: Secondary | ICD-10-CM

## 2018-05-06 DIAGNOSIS — F411 Generalized anxiety disorder: Secondary | ICD-10-CM | POA: Diagnosis not present

## 2018-05-06 DIAGNOSIS — R69 Illness, unspecified: Secondary | ICD-10-CM | POA: Diagnosis not present

## 2018-05-06 NOTE — Progress Notes (Signed)
   THERAPIST PROGRESS NOTE  Session Time: 11:00am-12:00pm  Participation Level: Active  Behavioral Response: NeatAlertEuthymic  Type of Therapy: Individual Therapy  Treatment Goals addressed: Improve Psychiatric Symptoms, Elevate Mood, Improve Unhelpful Thought Patterns, decrease anxiety, learn about diagnosis, healthy coping skills   Interventions: Motivational Interviewing, CBT, Grounding & Mindfulness Techniques  Summary: [name] is a 52  y.o. female who presents with Major Depressive Disorder, Recurrent, morderate, and Generalized Anxiety Disorder  Suicidal/Homicidal: No -without intent/plan  Therapist Response: [name] met with clinician for an individual session. [name] discussed her psychiatric symptoms and her current life events. [name] shared that she has been feeling better ever since she got her new puppy. [name] reports he has really changed her focus and made her feel excited to get up every day. [name] also identified new involvement in a sewing group that meets twice a week at the [facility]. She reports having a "purpose" has also made a huge difference in her life. [name] shared photos of her new pup, as well as her sewing projects. Clinician processed these changes and noted the importance of maintaining healthy social and vocational interactions in order to improve mood and keep life balanced.   Plan: Return again in 2-3 weeks  Diagnosis:     Axis I: Major Depressive Disorder, Recurrent moderate and Generalized Anxiety Disorder    [name], LCSW 05/06/2018

## 2018-05-08 ENCOUNTER — Other Ambulatory Visit: Payer: Self-pay | Admitting: Orthopedic Surgery

## 2018-05-08 DIAGNOSIS — S92354D Nondisplaced fracture of fifth metatarsal bone, right foot, subsequent encounter for fracture with routine healing: Secondary | ICD-10-CM

## 2018-05-13 ENCOUNTER — Ambulatory Visit
Admission: RE | Admit: 2018-05-13 | Discharge: 2018-05-13 | Disposition: A | Discharge status: Home / Self Care | Payer: Medicare HMO | Source: Ambulatory Visit | Attending: Orthopedic Surgery | Admitting: Orthopedic Surgery

## 2018-05-13 DIAGNOSIS — S92354D Nondisplaced fracture of fifth metatarsal bone, right foot, subsequent encounter for fracture with routine healing: Secondary | ICD-10-CM

## 2018-05-13 DIAGNOSIS — M25475 Effusion, left foot: Secondary | ICD-10-CM | POA: Diagnosis not present

## 2018-05-13 DIAGNOSIS — S92353A Displaced fracture of fifth metatarsal bone, unspecified foot, initial encounter for closed fracture: Secondary | ICD-10-CM | POA: Insufficient documentation
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
## 2018-05-20 DIAGNOSIS — M25571 Pain in right ankle and joints of right foot: Secondary | ICD-10-CM | POA: Diagnosis not present

## 2018-05-20 DIAGNOSIS — M79671 Pain in right foot: Secondary | ICD-10-CM | POA: Diagnosis not present

## 2018-05-21 ENCOUNTER — Ambulatory Visit (INDEPENDENT_AMBULATORY_CARE_PROVIDER_SITE_OTHER): Payer: Medicare HMO | Admitting: Licensed Clinical Social Worker

## 2018-05-21 ENCOUNTER — Encounter (HOSPITAL_COMMUNITY): Payer: Self-pay | Admitting: Licensed Clinical Social Worker

## 2018-05-21 DIAGNOSIS — F411 Generalized anxiety disorder: Secondary | ICD-10-CM | POA: Diagnosis not present

## 2018-05-21 DIAGNOSIS — F331 Major depressive disorder, recurrent, moderate: Secondary | ICD-10-CM

## 2018-05-21 DIAGNOSIS — R69 Illness, unspecified: Secondary | ICD-10-CM | POA: Diagnosis not present

## 2018-05-21 NOTE — Progress Notes (Signed)
   THERAPIST PROGRESS NOTE  Session Time: 11:00am-11:55am  Participation Level: Active  Behavioral Response: CasualAlertIrritable  Type of Therapy: Individual Therapy  Treatment Goals addressed: Improve Psychiatric Symptoms, Elevate Mood, Improve Unhelpful Thought Patterns, decrease anxiety, learn about diagnosis, healthy coping skills   Interventions: Motivational Interviewing, CBT, Grounding & Mindfulness Techniques  Summary: [name] is a 52  y.o. female who presents with Major Depressive Disorder, Recurrent moderate and Generalized Anxiety Disorder  Suicidal/Homicidal: No -without intent/plan  Therapist Response: [name] met with clinician for an individual session. [name] discussed her psychiatric symptoms and her current life events. [name] shared that she felt very irritated following a nosy text from her cousin this morning, which criticized her for not burying her mother's ashes yet. Monik processed thoughts and feelings. Clinician utilized CBT to explore the importance and value of this person's opinion in her life. Clinician also encouraged [name] to give this as much attention as it deserves, which is none. [name] agreed and continued to process upcoming surgery on her elbow, slated for Nov 20. She reported her plan on having a mom's birthday/thanksgiving dinner on Nov 22, as she plans to be away for Thanksgiving the following week. Clinician praised [name] for reclaiming her mother's birthday as a celebratory day and encouraged [name] to keep positive and happy thoughts in her mind about good times with mom.   Plan: Return again in 2-3 weeks  Diagnosis:     Axis I: Major Depressive Disorder, Recurrent moderate and Generalized Anxiety Disorder    [name], LCSW 05/21/2018

## 2018-05-28 NOTE — Pre-Procedure Instructions (Signed)
[name]  05/28/2018      [facility] [address] [place], [place] [address] Phone: [phone] Fax: [phone]    Your procedure is scheduled on June 05, 2018.  Report to Operating Room Services Admitting at 100 PM.  Call this number if you have problems the morning of surgery:  [phone]   Remember:  Do not eat or drink after midnight.    Take these medicines the morning of surgery with A SIP OF WATER  Albuterol inhaler-if needed Flexeril-if needed Levothyroxine (synthroid) Duloxetine (cymbalta) Oxybutynin (ditropan-XL) Oxycodone-if needed for pain Lorazepam (ativan)-if needed Lithium carbonate (lithobid)  7 days prior to surgery STOP taking any indomethacin (indocin), Aspirin (unless otherwise instructed by your surgeon), Aleve, Naproxen, Ibuprofen, Motrin, Advil, Goody's, BC's, all herbal medications, fish oil, and all vitamins  WHAT DO I DO ABOUT MY DIABETES MEDICATION?   Marland Kitchen Do not take oral diabetes medicines (pills) the morning of surgery-metformin (glucophage).  Reviewed and Endorsed by [facility] Patient Education Committee, August 2015   How to Manage Your Diabetes Before and After Surgery  Why is it important to control my blood sugar before and after surgery? . Improving blood sugar levels before and after surgery helps healing and can limit problems. . A way of improving blood sugar control is eating a healthy diet by: o  Eating less sugar and carbohydrates o  Increasing activity/exercise o  Talking with your doctor about reaching your blood sugar goals . High blood sugars (greater than 180 mg/dL) can raise your risk of infections and slow your recovery, so you will need to focus on controlling your diabetes during the weeks before surgery. . Make sure that the doctor who takes care of your diabetes knows about your planned surgery including the date and  location.  How do I manage my blood sugar before surgery? . Check your blood sugar at least 4 times a day, starting 2 days before surgery, to make sure that the level is not too high or low. o Check your blood sugar the morning of your surgery when you wake up and every 2 hours until you get to the Short Stay unit. . If your blood sugar is less than 70 mg/dL, you will need to treat for low blood sugar: o Do not take insulin. o Treat a low blood sugar (less than 70 mg/dL) with  cup of clear juice (cranberry or apple), 4 glucose tablets, OR glucose gel. Recheck blood sugar in 15 minutes after treatment (to make sure it is greater than 70 mg/dL). If your blood sugar is not greater than 70 mg/dL on recheck, call [phone] o  for further instructions. . Report your blood sugar to the short stay nurse when you get to Short Stay.  . If you are admitted to the hospital after surgery: o Your blood sugar will be checked by the staff and you will probably be given insulin after surgery (instead of oral diabetes medicines) to make sure you have good blood sugar levels. o The goal for blood sugar control after surgery is 80-180 mg/dL.   Do not wear jewelry, make-up or nail polish.  Do not wear lotions, powders, or perfumes, or deodorant.  Do not shave 48 hours prior to surgery.  Men may shave face and neck.  Do not bring valuables to the hospital.  [facility] is not responsible for any belongings or valuables.  Contacts, dentures  or bridgework may not be worn into surgery.  Leave your suitcase in the car.  After surgery it may be brought to your room.  For patients admitted to the hospital, discharge time will be determined by your treatment team.  Patients discharged the day of surgery will not be allowed to drive home.    [place]- Preparing For Surgery  Before surgery, you can play an important role. Because skin is not sterile, your skin needs to be as free of germs as possible. You can  reduce the number of germs on your skin by washing with CHG (chlorahexidine gluconate) Soap before surgery.  CHG is an antiseptic cleaner which kills germs and bonds with the skin to continue killing germs even after washing.    Oral Hygiene is also important to reduce your risk of infection.  Remember - BRUSH YOUR TEETH THE MORNING OF SURGERY WITH YOUR REGULAR TOOTHPASTE  Please do not use if you have an allergy to CHG or antibacterial soaps. If your skin becomes reddened/irritated stop using the CHG.  Do not shave (including legs and underarms) for at least 48 hours prior to first CHG shower. It is OK to shave your face.  Please follow these instructions carefully.   1. Shower the NIGHT BEFORE SURGERY and the MORNING OF SURGERY with CHG.   2. If you chose to wash your hair, wash your hair first as usual with your normal shampoo.  3. After you shampoo, rinse your hair and body thoroughly to remove the shampoo.  4. Use CHG as you would any other liquid soap. You can apply CHG directly to the skin and wash gently with a scrungie or a clean washcloth.   5. Apply the CHG Soap to your body ONLY FROM THE NECK DOWN.  Do not use on open wounds or open sores. Avoid contact with your eyes, ears, mouth and genitals (private parts). Wash Face and genitals (private parts)  with your normal soap.  6. Wash thoroughly, paying special attention to the area where your surgery will be performed.  7. Thoroughly rinse your body with warm water from the neck down.  8. DO NOT shower/wash with your normal soap after using and rinsing off the CHG Soap.  9. Pat yourself dry with a CLEAN TOWEL.  10. Wear CLEAN PAJAMAS to bed the night before surgery, wear comfortable clothes the morning of surgery  11. Place CLEAN SHEETS on your bed the night of your first shower and DO NOT SLEEP WITH PETS.  Day of Surgery:  Do not apply any deodorants/lotions.  Please wear clean clothes to the hospital/surgery center.    Remember to brush your teeth WITH YOUR REGULAR TOOTHPASTE.  Please read over the following fact sheets that you were given.

## 2018-05-29 ENCOUNTER — Encounter (HOSPITAL_COMMUNITY): Payer: Self-pay

## 2018-05-29 ENCOUNTER — Encounter (HOSPITAL_COMMUNITY)
Admission: RE | Admit: 2018-05-29 | Discharge: 2018-05-29 | Disposition: A | Discharge status: Home / Self Care | Payer: Medicare HMO | Source: Ambulatory Visit | Attending: Orthopedic Surgery | Admitting: Orthopedic Surgery

## 2018-05-29 DIAGNOSIS — G5622 Lesion of ulnar nerve, left upper limb: Secondary | ICD-10-CM | POA: Diagnosis not present

## 2018-05-29 DIAGNOSIS — R079 Chest pain, unspecified: Secondary | ICD-10-CM | POA: Diagnosis not present

## 2018-05-29 DIAGNOSIS — M65812 Other synovitis and tenosynovitis, left shoulder: Secondary | ICD-10-CM | POA: Insufficient documentation

## 2018-05-29 DIAGNOSIS — F4310 Post-traumatic stress disorder, unspecified: Secondary | ICD-10-CM | POA: Insufficient documentation

## 2018-05-29 DIAGNOSIS — Z79891 Long term (current) use of opiate analgesic: Secondary | ICD-10-CM | POA: Diagnosis not present

## 2018-05-29 DIAGNOSIS — Z79890 Hormone replacement therapy (postmenopausal): Secondary | ICD-10-CM | POA: Insufficient documentation

## 2018-05-29 DIAGNOSIS — J069 Acute upper respiratory infection, unspecified: Secondary | ICD-10-CM | POA: Diagnosis not present

## 2018-05-29 DIAGNOSIS — Z79899 Other long term (current) drug therapy: Secondary | ICD-10-CM | POA: Insufficient documentation

## 2018-05-29 DIAGNOSIS — Z01812 Encounter for preprocedural laboratory examination: Secondary | ICD-10-CM | POA: Insufficient documentation

## 2018-05-29 DIAGNOSIS — M797 Fibromyalgia: Secondary | ICD-10-CM | POA: Diagnosis not present

## 2018-05-29 DIAGNOSIS — I10 Essential (primary) hypertension: Secondary | ICD-10-CM | POA: Diagnosis not present

## 2018-05-29 DIAGNOSIS — E039 Hypothyroidism, unspecified: Secondary | ICD-10-CM | POA: Diagnosis not present

## 2018-05-29 DIAGNOSIS — E785 Hyperlipidemia, unspecified: Secondary | ICD-10-CM | POA: Insufficient documentation

## 2018-05-29 DIAGNOSIS — R69 Illness, unspecified: Secondary | ICD-10-CM | POA: Diagnosis not present

## 2018-05-29 DIAGNOSIS — G4733 Obstructive sleep apnea (adult) (pediatric): Secondary | ICD-10-CM | POA: Insufficient documentation

## 2018-05-29 HISTORY — DX: Pneumonia, unspecified organism: J18.9

## 2018-05-29 LAB — CBC
HCT: 43.7 % (ref 36.0–46.0)
Hemoglobin: 14.1 g/dL (ref 12.0–15.0)
MCH: 30.2 pg (ref 26.0–34.0)
MCHC: 32.3 g/dL (ref 30.0–36.0)
MCV: 93.6 fL (ref 80.0–100.0)
NRBC: 0 % (ref 0.0–0.2)
PLATELETS: 206 10*3/uL (ref 150–400)
RBC: 4.67 MIL/uL (ref 3.87–5.11)
RDW: 13.9 % (ref 11.5–15.5)
WBC: 7.5 10*3/uL (ref 4.0–10.5)

## 2018-05-29 LAB — BASIC METABOLIC PANEL
Anion gap: 7 (ref 5–15)
BUN: 12 mg/dL (ref 6–20)
CALCIUM: 9.2 mg/dL (ref 8.9–10.3)
CO2: 25 mmol/L (ref 22–32)
CREATININE: 0.66 mg/dL (ref 0.44–1.00)
Chloride: 103 mmol/L (ref 98–111)
GFR calc Af Amer: >60 mL/min (ref >60)
GLUCOSE: 325 mg/dL — AB (ref 70–99)
Potassium: 4.1 mmol/L (ref 3.5–5.1)
Sodium: 135 mmol/L (ref 135–145)

## 2018-05-29 LAB — HEMOGLOBIN A1C
HEMOGLOBIN A1C: 8 % — AB (ref 4.8–5.6)
Mean Plasma Glucose: 182.9 mg/dL

## 2018-05-29 LAB — GLUCOSE, CAPILLARY: Glucose-Capillary: 341 mg/dL — ABNORMAL HIGH (ref 70–99)

## 2018-05-29 NOTE — Progress Notes (Addendum)
Pcp:  Dr. Antony Contras @ [facility]  No cardiologist  Pt. Reporting she 's going to Dr. Moreen Fowler today for possible sinus infection.  Blood sugar 341. Stated she forgot to take medicine last night and this am. This am 0100 ate bowl of pasta.  Fasting sugar 90's  Notified Dr. Lequita Asal nurse of pt. Stating he was also going to give her injections in both thumbs. No orders in epic for this procedure.
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
## 2018-05-30 NOTE — Anesthesia Preprocedure Evaluation (Addendum)
Anesthesia Evaluation  Patient identified by MRN, date of birth, ID band Patient awake    Reviewed: Allergy & Precautions, H&P , NPO status , Patient's Chart, lab work & pertinent test results  Airway Mallampati: III  TM Distance: >3 FB Neck ROM: Full    Dental no notable dental hx. (+) Teeth Intact, Dental Advisory Given   Pulmonary asthma , sleep apnea and Continuous Positive Airway Pressure Ventilation ,    Pulmonary exam normal breath sounds clear to auscultation       Cardiovascular hypertension,  Rhythm:Regular Rate:Normal     Neuro/Psych  Headaches, Anxiety Depression    GI/Hepatic negative GI ROS, Neg liver ROS,   Endo/Other  diabetes, Type 2, Oral Hypoglycemic AgentsHypothyroidism Morbid obesity  Renal/GU negative Renal ROS  negative genitourinary   Musculoskeletal  (+) Arthritis , Fibromyalgia -  Abdominal   Peds  Hematology negative hematology ROS (+)   Anesthesia Other Findings   Reproductive/Obstetrics negative OB ROS                           Anesthesia Physical Anesthesia Plan  ASA: III  Anesthesia Plan: General   Post-op Pain Management:    Induction: Intravenous  PONV Risk Score and Plan: 4 or greater and Ondansetron, Dexamethasone and Midazolam  Airway Management Planned: Oral ETT  Additional Equipment:   Intra-op Plan:   Post-operative Plan: Extubation in OR  Informed Consent: I have reviewed the patients History and Physical, chart, labs and discussed the procedure including the risks, benefits and alternatives for the proposed anesthesia with the patient or authorized representative who has indicated his/her understanding and acceptance.   Dental advisory given  Plan Discussed with: CRNA  Anesthesia Plan Comments: (PAT note written 05/29/2018 by Myra Gianotti, PA-C. )       Anesthesia Quick Evaluation

## 2018-06-03 ENCOUNTER — Ambulatory Visit (HOSPITAL_COMMUNITY): Payer: Self-pay | Admitting: Licensed Clinical Social Worker

## 2018-06-04 MED ORDER — DEXTROSE 5 % IV SOLN
3.0000 g | INTRAVENOUS | Status: AC
  Administered 2018-06-05: 3 g via INTRAVENOUS
  Filled 2018-06-04: qty 3

## 2018-06-05 ENCOUNTER — Ambulatory Visit (HOSPITAL_COMMUNITY): Payer: Medicare HMO | Admitting: Anesthesiology

## 2018-06-05 ENCOUNTER — Encounter (HOSPITAL_COMMUNITY)
Admission: RE | Disposition: A | Discharge status: Home / Self Care | Payer: Self-pay | Source: Ambulatory Visit | Attending: Orthopedic Surgery

## 2018-06-05 ENCOUNTER — Ambulatory Visit (HOSPITAL_COMMUNITY)
Admission: RE | Admit: 2018-06-05 | Discharge: 2018-06-05 | Disposition: A | Discharge status: Home / Self Care | Payer: Medicare HMO | Source: Ambulatory Visit | Attending: Orthopedic Surgery | Admitting: Orthopedic Surgery

## 2018-06-05 ENCOUNTER — Ambulatory Visit (HOSPITAL_COMMUNITY): Payer: Medicare HMO | Admitting: Vascular Surgery

## 2018-06-05 ENCOUNTER — Encounter (HOSPITAL_COMMUNITY): Payer: Self-pay | Admitting: Urology

## 2018-06-05 DIAGNOSIS — J45909 Unspecified asthma, uncomplicated: Secondary | ICD-10-CM | POA: Diagnosis not present

## 2018-06-05 DIAGNOSIS — M65822 Other synovitis and tenosynovitis, left upper arm: Secondary | ICD-10-CM | POA: Diagnosis not present

## 2018-06-05 DIAGNOSIS — Z96653 Presence of artificial knee joint, bilateral: Secondary | ICD-10-CM | POA: Insufficient documentation

## 2018-06-05 DIAGNOSIS — E039 Hypothyroidism, unspecified: Secondary | ICD-10-CM | POA: Insufficient documentation

## 2018-06-05 DIAGNOSIS — G5622 Lesion of ulnar nerve, left upper limb: Secondary | ICD-10-CM

## 2018-06-05 DIAGNOSIS — M180 Bilateral primary osteoarthritis of first carpometacarpal joints: Secondary | ICD-10-CM | POA: Diagnosis not present

## 2018-06-05 DIAGNOSIS — E785 Hyperlipidemia, unspecified: Secondary | ICD-10-CM | POA: Diagnosis not present

## 2018-06-05 DIAGNOSIS — M25422 Effusion, left elbow: Secondary | ICD-10-CM | POA: Diagnosis not present

## 2018-06-05 DIAGNOSIS — R69 Illness, unspecified: Secondary | ICD-10-CM | POA: Diagnosis not present

## 2018-06-05 DIAGNOSIS — I10 Essential (primary) hypertension: Secondary | ICD-10-CM | POA: Diagnosis not present

## 2018-06-05 DIAGNOSIS — M1990 Unspecified osteoarthritis, unspecified site: Secondary | ICD-10-CM | POA: Diagnosis not present

## 2018-06-05 DIAGNOSIS — M797 Fibromyalgia: Secondary | ICD-10-CM | POA: Diagnosis not present

## 2018-06-05 DIAGNOSIS — Z6841 Body Mass Index (BMI) 40.0 and over, adult: Secondary | ICD-10-CM | POA: Diagnosis not present

## 2018-06-05 DIAGNOSIS — M659 Synovitis and tenosynovitis, unspecified: Secondary | ICD-10-CM | POA: Diagnosis not present

## 2018-06-05 HISTORY — PX: STERIOD INJECTION: SHX5046

## 2018-06-05 HISTORY — PX: ULNAR NERVE TRANSPOSITION: SHX2595

## 2018-06-05 LAB — GLUCOSE, CAPILLARY
GLUCOSE-CAPILLARY: 197 mg/dL — AB (ref 70–99)
GLUCOSE-CAPILLARY: 169 mg/dL — AB (ref 70–99)
GLUCOSE-CAPILLARY: 167 mg/dL — AB (ref 70–99)

## 2018-06-05 LAB — POCT PREGNANCY, URINE: PREG TEST UR: NEGATIVE

## 2018-06-05 SURGERY — ULNAR NERVE DECOMPRESSION/TRANSPOSITION
Anesthesia: General | Site: Thumb | Laterality: Left

## 2018-06-05 MED ORDER — FENTANYL CITRATE (PF) 250 MCG/5ML IJ SOLN
INTRAMUSCULAR | Status: AC
  Filled 2018-06-05: qty 5

## 2018-06-05 MED ORDER — BUPIVACAINE HCL (PF) 0.25 % IJ SOLN
INTRAMUSCULAR | Status: AC
  Filled 2018-06-05: qty 30

## 2018-06-05 MED ORDER — MIDAZOLAM HCL 5 MG/5ML IJ SOLN
INTRAMUSCULAR | Status: DC | PRN
  Administered 2018-06-05 (×2): 1 mg via INTRAVENOUS

## 2018-06-05 MED ORDER — SUCCINYLCHOLINE CHLORIDE 200 MG/10ML IV SOSY
PREFILLED_SYRINGE | INTRAVENOUS | Status: DC | PRN
  Administered 2018-06-05: 120 mg via INTRAVENOUS

## 2018-06-05 MED ORDER — PROPOFOL 10 MG/ML IV BOLUS
INTRAVENOUS | Status: AC
  Filled 2018-06-05: qty 40

## 2018-06-05 MED ORDER — HYDROMORPHONE HCL 1 MG/ML IJ SOLN
INTRAMUSCULAR | Status: AC
  Filled 2018-06-05: qty 1

## 2018-06-05 MED ORDER — ONDANSETRON HCL 4 MG/2ML IJ SOLN
INTRAMUSCULAR | Status: AC
  Filled 2018-06-05: qty 2

## 2018-06-05 MED ORDER — CHLORHEXIDINE GLUCONATE 4 % EX LIQD: 60.0000 mL | Freq: Once | CUTANEOUS | Status: DC

## 2018-06-05 MED ORDER — SUGAMMADEX SODIUM 500 MG/5ML IV SOLN
INTRAVENOUS | Status: DC | PRN
  Administered 2018-06-05: 300 mg via INTRAVENOUS

## 2018-06-05 MED ORDER — BUPIVACAINE HCL (PF) 0.25 % IJ SOLN
INTRAMUSCULAR | Status: DC | PRN
  Administered 2018-06-05: 2 mL

## 2018-06-05 MED ORDER — MIDAZOLAM HCL 2 MG/2ML IJ SOLN
INTRAMUSCULAR | Status: AC
  Filled 2018-06-05: qty 2

## 2018-06-05 MED ORDER — LACTATED RINGERS IV SOLN
INTRAVENOUS | Status: DC
  Administered 2018-06-05: 13:00:00 via INTRAVENOUS

## 2018-06-05 MED ORDER — HYDROMORPHONE HCL 1 MG/ML IJ SOLN
0.2500 mg | INTRAMUSCULAR | Status: DC | PRN
  Administered 2018-06-05 (×4): 0.5 mg via INTRAVENOUS

## 2018-06-05 MED ORDER — BETAMETHASONE SOD PHOS & ACET 6 (3-3) MG/ML IJ SUSP
12.0000 mg | INTRAMUSCULAR | Status: DC
  Filled 2018-06-05 (×2): qty 2

## 2018-06-05 MED ORDER — ROCURONIUM BROMIDE 50 MG/5ML IV SOSY
PREFILLED_SYRINGE | INTRAVENOUS | Status: DC | PRN
  Administered 2018-06-05: 40 mg via INTRAVENOUS

## 2018-06-05 MED ORDER — BUPIVACAINE HCL (PF) 0.25 % IJ SOLN
INTRAMUSCULAR | Status: DC | PRN
  Administered 2018-06-05: 9 mL

## 2018-06-05 MED ORDER — ONDANSETRON HCL 4 MG/2ML IJ SOLN
INTRAMUSCULAR | Status: DC | PRN
  Administered 2018-06-05: 4 mg via INTRAVENOUS

## 2018-06-05 MED ORDER — FENTANYL CITRATE (PF) 100 MCG/2ML IJ SOLN
INTRAMUSCULAR | Status: DC | PRN
  Administered 2018-06-05 (×3): 50 ug via INTRAVENOUS

## 2018-06-05 MED ORDER — PHENYLEPHRINE 40 MCG/ML (10ML) SYRINGE FOR IV PUSH (FOR BLOOD PRESSURE SUPPORT)
PREFILLED_SYRINGE | INTRAVENOUS | Status: DC | PRN
  Administered 2018-06-05: 80 ug via INTRAVENOUS
  Administered 2018-06-05: 120 ug via INTRAVENOUS
  Administered 2018-06-05 (×2): 80 ug via INTRAVENOUS

## 2018-06-05 MED ORDER — SUCCINYLCHOLINE CHLORIDE 200 MG/10ML IV SOSY
PREFILLED_SYRINGE | INTRAVENOUS | Status: AC
  Filled 2018-06-05: qty 10

## 2018-06-05 MED ORDER — METHYLPREDNISOLONE ACETATE 40 MG/ML IJ SUSP
INTRAMUSCULAR | Status: AC
  Filled 2018-06-05: qty 2

## 2018-06-05 MED ORDER — PHENYLEPHRINE 40 MCG/ML (10ML) SYRINGE FOR IV PUSH (FOR BLOOD PRESSURE SUPPORT)
PREFILLED_SYRINGE | INTRAVENOUS | Status: AC
  Filled 2018-06-05: qty 10

## 2018-06-05 MED ORDER — BETAMETHASONE SOD PHOS & ACET 6 (3-3) MG/ML IJ SUSP
INTRAMUSCULAR | Status: DC | PRN
  Administered 2018-06-05: 6 mg via INTRA_ARTICULAR

## 2018-06-05 MED ORDER — LIDOCAINE 2% (20 MG/ML) 5 ML SYRINGE
INTRAMUSCULAR | Status: DC | PRN
  Administered 2018-06-05: 60 mg via INTRAVENOUS

## 2018-06-05 MED ORDER — ROCURONIUM BROMIDE 50 MG/5ML IV SOSY
PREFILLED_SYRINGE | INTRAVENOUS | Status: AC
  Filled 2018-06-05: qty 5

## 2018-06-05 MED ORDER — SUGAMMADEX SODIUM 500 MG/5ML IV SOLN
INTRAVENOUS | Status: AC
  Filled 2018-06-05: qty 5

## 2018-06-05 MED ORDER — PROPOFOL 10 MG/ML IV BOLUS
INTRAVENOUS | Status: DC | PRN
  Administered 2018-06-05: 130 mg via INTRAVENOUS

## 2018-06-05 SURGICAL SUPPLY — 41 items
BANDAGE ACE 3X5.8 VEL STRL LF (GAUZE/BANDAGES/DRESSINGS) IMPLANT
BANDAGE ACE 4X5 VEL STRL LF (GAUZE/BANDAGES/DRESSINGS) IMPLANT
BNDG GAUZE ELAST 4 BULKY (GAUZE/BANDAGES/DRESSINGS) IMPLANT
CORDS BIPOLAR (ELECTRODE) ×3 IMPLANT
COVER SURGICAL LIGHT HANDLE (MISCELLANEOUS) ×3 IMPLANT
COVER WAND RF STERILE (DRAPES) ×3 IMPLANT
CUFF TOURNIQUET SINGLE 18IN (TOURNIQUET CUFF) ×2 IMPLANT
CUFF TOURNIQUET SINGLE 24IN (TOURNIQUET CUFF) ×2 IMPLANT
DRAPE SURG 17X23 STRL (DRAPES) ×3 IMPLANT
DRSG ADAPTIC 3X8 NADH LF (GAUZE/BANDAGES/DRESSINGS) IMPLANT
GAUZE SPONGE 4X4 12PLY STRL (GAUZE/BANDAGES/DRESSINGS) IMPLANT
GLOVE BIOGEL PI IND STRL 8.5 (GLOVE) ×2 IMPLANT
GLOVE BIOGEL PI INDICATOR 8.5 (GLOVE) ×1
GLOVE SURG ORTHO 8.0 STRL STRW (GLOVE) ×3 IMPLANT
GOWN STRL REUS W/ TWL LRG LVL3 (GOWN DISPOSABLE) ×4 IMPLANT
GOWN STRL REUS W/ TWL XL LVL3 (GOWN DISPOSABLE) ×2 IMPLANT
GOWN STRL REUS W/TWL LRG LVL3 (GOWN DISPOSABLE) ×6
GOWN STRL REUS W/TWL XL LVL3 (GOWN DISPOSABLE) ×3
KIT BASIN OR (CUSTOM PROCEDURE TRAY) ×3 IMPLANT
KIT TURNOVER KIT B (KITS) ×3 IMPLANT
NDL HYPO 25GX1X1/2 BEV (NEEDLE) IMPLANT
NEEDLE HYPO 25GX1X1/2 BEV (NEEDLE) IMPLANT
NS IRRIG 1000ML POUR BTL (IV SOLUTION) ×3 IMPLANT
PACK ORTHO EXTREMITY (CUSTOM PROCEDURE TRAY) ×3 IMPLANT
PAD ARMBOARD 7.5X6 YLW CONV (MISCELLANEOUS) ×6 IMPLANT
PAD CAST 4YDX4 CTTN HI CHSV (CAST SUPPLIES) IMPLANT
PADDING CAST COTTON 4X4 STRL (CAST SUPPLIES)
SOAP 2 % CHG 4 OZ (WOUND CARE) ×3 IMPLANT
SOL PREP POV-IOD 4OZ 10% (MISCELLANEOUS) ×3 IMPLANT
SUT MERSILENE 4 0 P 3 (SUTURE) IMPLANT
SUT PROLENE 4 0 PS 2 18 (SUTURE) IMPLANT
SUT VIC AB 2-0 CT1 27 (SUTURE)
SUT VIC AB 2-0 CT1 TAPERPNT 27 (SUTURE) IMPLANT
SUT VIC AB 2-0 SH 27 (SUTURE) ×3
SUT VIC AB 2-0 SH 27XBRD (SUTURE) IMPLANT
SYR CONTROL 10ML LL (SYRINGE) IMPLANT
TOWEL OR 17X24 6PK STRL BLUE (TOWEL DISPOSABLE) ×3 IMPLANT
TOWEL OR 17X26 10 PK STRL BLUE (TOWEL DISPOSABLE) ×3 IMPLANT
TUBE CONNECTING 12X1/4 (SUCTIONS) IMPLANT
UNDERPAD 30X30 (UNDERPADS AND DIAPERS) ×3 IMPLANT
WATER STERILE IRR 1000ML POUR (IV SOLUTION) ×3 IMPLANT

## 2018-06-05 NOTE — Op Note (Signed)
PREOPERATIVE DIAGNOSIS: Left elbow ulnar nerve entrapment Left elbow effusion Left thumb carpometacarpal arthritis Right thumb carpometarcapal arthritis  POSTOPERATIVE DIAGNOSIS:Same  ATTENDING SURGEON:Dr.Maude Hettich Caralyn Guile MD  ASSISTANT SURGEON:Samantha Tenny Craw was scrubbed and necessary for the entire procedure who helped aid in nerve release joint arthrotomy exploration and closure and splinting in a timely fashion  ANESTHESIA:Gen. Via endotracheal tube  OPERATIVE PROCEDURE: #1: Left elbow ulnar nerve release, in situ ulnar nerve release #2: Left elbow arthrotomy exploration and drainage of large elbow effusion and mild synovectomy  #3: Left thumb carpometacarpal intermediate joint injection #4: Right thumb carpometacarpal intermediate joint injection  IMPLANTS:none  RADIOGRAPHIC INTERPRETATION:none  SURGICAL INDICATIONS:patient is a right-hand-dominant female with a persistent left elbow pain. Patient had failed nonsurgical treatment. Patient elected undergo the above procedure. Risks of surgery include but not limited to bleeding infection damage to nearby nerves arteries or tendons loss of motion of the wrists and digits incomplete relief of symptoms and need for further surgical intervention.  SURGICAL TECHNIQUE:patient is properly identified in the preoperative holding area marked for permanent marker made a left elbow right and left thumb indicate correct operative site. Patient brought back to operating room placed supine on anesthesia and table where general anesthesia was administered. Patient tolerated this well. We will have the tourniquet was then placed on the brachium say with the appropriate drape. Left upper extremity was prepped and draped in normal sterile fashion. A timeout was called cracks I was identified and the procedure then begun. Attention then turned the left elbow. A curvilinear posterior medial incision was then made directly over the elbow. Dissection carried  down through the skin and subcutaneous tissue. Blunt dissection carried all the way down to the cubital tunnel. Distal branches of the medial antebrachial cutaneous nerve were then carried carefully protected. Following this the ulnar nerve was then identified proximally. The nerve was then carefully released proximally as well as distally through the cubital tunnel through Osborne's ligament and the FCU fascia. The nerve was released without any abnormalities within the cubital tunnel. Following this or this the nerve was then carefully mobilized distally. The nerve was unable to really bluntly retracted and then the joint was then exposed going through the floor the FCU. Careful protection of the medial collateral ligament was then done. Following this joint arthrotomy was then done large effusion was evacuated out of the elbow joint. Inspection of the joint did show the arthrosis within the ulnohumeral joint. Thorough synovectomy was then carried out from the medial window. The wound was then thoroughly irrigated. After thorough wound irrigation of the elbow joint elbow was placed through a full range of motion there did not appear to be any subluxation of the ulnar nerve. The interval over the joint was then closed with 3-0 Vicryl suture. The subcutaneous tissues closed with 4-0 Vicryl suture. The skin closed with simple Prolene sutures. Adaptic dressing a sterile compressive bandage then applied. 10 mL accord percent Marcaine infiltrated locally. Adaptic dressing a sterile compressive bandage then applied. Patient was placed in a long-arm splint and extubated and taken recovery room in good condition.  Prior to the extubation intermediate joint injections were performed of both the right and the left thumb. 1 mL Marcaine 1 mL of Celestone was injected into the both carpometacarpal joints. Patient tolerated the procedures well.  Postoperative plan: Patient be discharged to home. Seen back now office in 2  weeks for wound check suture removal and transition to a long-arm splint with therapy begin working on  some gentle active range of motion and nerve gliding exercises. Radiographs of the elbow at the first postoperative visit  POSTOPERATIVE PLAN:

## 2018-06-05 NOTE — Anesthesia Procedure Notes (Signed)
Procedure Name: Intubation Date/Time: 06/05/2018 3:50 PM Performed by: Candis Shine, CRNA Pre-anesthesia Checklist: Patient identified, Emergency Drugs available, Suction available and Patient being monitored Patient Re-evaluated:Patient Re-evaluated prior to induction Oxygen Delivery Method: Circle System Utilized Preoxygenation: Pre-oxygenation with 100% oxygen Induction Type: IV induction Ventilation: Two handed mask ventilation required Laryngoscope Size: Glidescope and 3 Grade View: Grade I Tube type: Oral Tube size: 7.0 mm Number of attempts: 1 Airway Equipment and Method: Rigid stylet and Video-laryngoscopy Placement Confirmation: ETT inserted through vocal cords under direct vision,  positive ETCO2 and breath sounds checked- equal and bilateral Secured at: 22 cm Tube secured with: Tape Dental Injury: Teeth and Oropharynx as per pre-operative assessment  Difficulty Due To: Difficulty was anticipated Future Recommendations: Recommend- induction with short-acting agent, and alternative techniques readily available Comments: Elective glidescope intubation d/t previous cervical fusion and previous documentation of difficult intubation requiring glidescope.

## 2018-06-05 NOTE — Transfer of Care (Signed)
Immediate Anesthesia Transfer of Care Note  Patient: [name]  Procedure(s) Performed: ULNAR NERVE RELEASE AND/OR TRANSPOSITION AND JOINT EXPLORATION AND SYNOVECTOMY (Left Arm Lower) BILATERAL CARPOMETACARPAL INJECTION (Bilateral Thumb)  Patient Location: PACU  Anesthesia Type:General  Level of Consciousness: awake, alert  and oriented  Airway & Oxygen Therapy: Patient Spontanous Breathing and Patient connected to face mask oxygen  Post-op Assessment: Report given to RN and Post -op Vital signs reviewed and stable  Post vital signs: Reviewed and stable  Last Vitals:  Vitals Value Taken Time  BP 145/132 06/05/2018  5:11 PM  Temp    Pulse 109 06/05/2018  5:13 PM  Resp 16 06/05/2018  5:13 PM  SpO2 97 % 06/05/2018  5:13 PM  Vitals shown include unvalidated device data.  Last Pain:  Vitals:   06/05/18 1710  TempSrc:   PainSc: (P) 8          Complications: No apparent anesthesia complications

## 2018-06-05 NOTE — Discharge Instructions (Signed)
KEEP BANDAGE CLEAN AND DRY CALL OFFICE FOR F/U APPT [phone] IN 15 DAYS KEEP HAND ELEVATED ABOVE HEART OK TO APPLY ICE TO OPERATIVE AREA CONTACT OFFICE IF ANY WORSENING PAIN OR CONCERNS.  

## 2018-06-06 ENCOUNTER — Encounter (HOSPITAL_COMMUNITY): Payer: Self-pay | Admitting: Orthopedic Surgery

## 2018-06-06 NOTE — Anesthesia Postprocedure Evaluation (Signed)
Anesthesia Post Note  Patient: JAQUETTA CURRIER  Procedure(s) Performed: ULNAR NERVE RELEASE AND/OR TRANSPOSITION AND JOINT EXPLORATION AND SYNOVECTOMY (Left Arm Lower) BILATERAL CARPOMETACARPAL INJECTION (Bilateral Thumb)     Patient location during evaluation: PACU Anesthesia Type: General Level of consciousness: awake and alert Pain management: pain level controlled Vital Signs Assessment: post-procedure vital signs reviewed and stable Respiratory status: spontaneous breathing, nonlabored ventilation and respiratory function stable Cardiovascular status: blood pressure returned to baseline and stable Postop Assessment: no apparent nausea or vomiting Anesthetic complications: no    Last Vitals:  Vitals:   06/05/18 1738 06/05/18 1800  BP: 101/66 109/75  Pulse: (!) 122 (!) 103  Resp: 11 18  Temp:  37.1 C  SpO2: 99% 94%    Last Pain:  Vitals:   06/05/18 1744  TempSrc:   PainSc: 4                  Jaquia Benedicto,W. EDMOND

## 2018-06-18 DIAGNOSIS — G5622 Lesion of ulnar nerve, left upper limb: Secondary | ICD-10-CM | POA: Diagnosis not present

## 2018-06-18 DIAGNOSIS — Z4789 Encounter for other orthopedic aftercare: Secondary | ICD-10-CM | POA: Diagnosis not present

## 2018-06-18 DIAGNOSIS — M13842 Other specified arthritis, left hand: Secondary | ICD-10-CM | POA: Diagnosis not present

## 2018-06-18 DIAGNOSIS — G5620 Lesion of ulnar nerve, unspecified upper limb: Secondary | ICD-10-CM | POA: Diagnosis not present

## 2018-06-18 DIAGNOSIS — E1165 Type 2 diabetes mellitus with hyperglycemia: Secondary | ICD-10-CM | POA: Diagnosis not present

## 2018-06-18 DIAGNOSIS — M13841 Other specified arthritis, right hand: Secondary | ICD-10-CM | POA: Diagnosis not present

## 2018-06-20 ENCOUNTER — Ambulatory Visit (INDEPENDENT_AMBULATORY_CARE_PROVIDER_SITE_OTHER): Payer: Medicare HMO | Admitting: Psychiatry

## 2018-06-20 ENCOUNTER — Encounter (HOSPITAL_COMMUNITY): Payer: Self-pay | Admitting: Psychiatry

## 2018-06-20 DIAGNOSIS — F5105 Insomnia due to other mental disorder: Secondary | ICD-10-CM | POA: Diagnosis not present

## 2018-06-20 DIAGNOSIS — F332 Major depressive disorder, recurrent severe without psychotic features: Secondary | ICD-10-CM | POA: Diagnosis not present

## 2018-06-20 DIAGNOSIS — F99 Mental disorder, not otherwise specified: Secondary | ICD-10-CM

## 2018-06-20 DIAGNOSIS — F411 Generalized anxiety disorder: Secondary | ICD-10-CM

## 2018-06-20 DIAGNOSIS — R69 Illness, unspecified: Secondary | ICD-10-CM | POA: Diagnosis not present

## 2018-06-20 MED ORDER — ESZOPICLONE 3 MG PO TABS: 3.0000 mg | ORAL_TABLET | Freq: Every day | ORAL | 2 refills | Status: DC

## 2018-06-20 MED ORDER — LORAZEPAM 1 MG PO TABS: 1.0000 mg | ORAL_TABLET | Freq: Two times a day (BID) | ORAL | 2 refills | Status: DC | PRN

## 2018-06-20 MED ORDER — LITHIUM CARBONATE ER 300 MG PO TBCR: 600.0000 mg | EXTENDED_RELEASE_TABLET | Freq: Every day | ORAL | 2 refills | Status: DC

## 2018-06-20 MED ORDER — DULOXETINE HCL 60 MG PO CPEP: 60.0000 mg | ORAL_CAPSULE | Freq: Two times a day (BID) | ORAL | 2 refills | Status: DC

## 2018-06-20 MED ORDER — MIRTAZAPINE 30 MG PO TABS: 60.0000 mg | ORAL_TABLET | Freq: Every day | ORAL | 2 refills | Status: DC
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
## 2018-06-25 DIAGNOSIS — S92354D Nondisplaced fracture of fifth metatarsal bone, right foot, subsequent encounter for fracture with routine healing: Secondary | ICD-10-CM | POA: Diagnosis not present

## 2018-06-28 DIAGNOSIS — R69 Illness, unspecified: Secondary | ICD-10-CM | POA: Diagnosis not present

## 2018-07-02 DIAGNOSIS — S92354D Nondisplaced fracture of fifth metatarsal bone, right foot, subsequent encounter for fracture with routine healing: Secondary | ICD-10-CM | POA: Diagnosis not present

## 2018-07-02 DIAGNOSIS — M7671 Peroneal tendinitis, right leg: Secondary | ICD-10-CM | POA: Diagnosis not present

## 2018-07-24 ENCOUNTER — Encounter (HOSPITAL_COMMUNITY): Payer: Self-pay | Admitting: Licensed Clinical Social Worker

## 2018-07-24 ENCOUNTER — Ambulatory Visit (HOSPITAL_COMMUNITY): Payer: Medicare HMO | Admitting: Licensed Clinical Social Worker

## 2018-07-24 DIAGNOSIS — F330 Major depressive disorder, recurrent, mild: Secondary | ICD-10-CM

## 2018-07-24 DIAGNOSIS — F411 Generalized anxiety disorder: Secondary | ICD-10-CM

## 2018-07-24 DIAGNOSIS — R69 Illness, unspecified: Secondary | ICD-10-CM | POA: Diagnosis not present

## 2018-07-24 NOTE — Progress Notes (Signed)
   THERAPIST PROGRESS NOTE  Session Time: 11:00am-11:50pm  Participation Level: Active  Behavioral Response: CasualAlertEuthymic  Type of Therapy: Individual Therapy  Treatment Goals addressed: Improve Psychiatric Symptoms, Elevate Mood, Improve Unhelpful Thought Patterns, decrease anxiety, learn about diagnosis, healthy coping skills   Interventions: Motivational Interviewing, CBT, Grounding & Mindfulness Techniques  Summary: [name] is a 53  y.o. female who presents with Major Depressive Disorder, Recurrent mild, and Generalized Anxiety Disorder  Suicidal/Homicidal: No -without intent/plan  Therapist Response: [name] met with clinician for an individual session. [name] discussed her psychiatric symptoms and her current life events. [name] shared that she is healing well from her recent elbow surgery. She reports she has made it through the holidays with no significant episodes of depression. She identified changes in her holiday routine which has been helpful. She also noted that she has been feeling more calm and patient with her mother in law. Clinician discussed positive interactions and stress relievers at home.   Plan: Return again in 3-4 weeks  Diagnosis:     Axis I: Major Depressive Disorder, Recurrent mild and Generalized Anxiety Disorder   [name], LCSW 07/24/2018

## 2018-08-08 DIAGNOSIS — E119 Type 2 diabetes mellitus without complications: Secondary | ICD-10-CM | POA: Diagnosis not present

## 2018-08-08 DIAGNOSIS — H16223 Keratoconjunctivitis sicca, not specified as Sjogren's, bilateral: Secondary | ICD-10-CM | POA: Diagnosis not present

## 2018-08-29 DIAGNOSIS — H04123 Dry eye syndrome of bilateral lacrimal glands: Secondary | ICD-10-CM | POA: Diagnosis not present

## 2018-08-29 DIAGNOSIS — H16223 Keratoconjunctivitis sicca, not specified as Sjogren's, bilateral: Secondary | ICD-10-CM | POA: Diagnosis not present

## 2018-09-02 ENCOUNTER — Ambulatory Visit (INDEPENDENT_AMBULATORY_CARE_PROVIDER_SITE_OTHER): Payer: Medicare HMO | Admitting: Licensed Clinical Social Worker

## 2018-09-02 ENCOUNTER — Encounter (HOSPITAL_COMMUNITY): Payer: Self-pay | Admitting: Licensed Clinical Social Worker

## 2018-09-02 DIAGNOSIS — R69 Illness, unspecified: Secondary | ICD-10-CM | POA: Diagnosis not present

## 2018-09-02 DIAGNOSIS — F330 Major depressive disorder, recurrent, mild: Secondary | ICD-10-CM | POA: Diagnosis not present

## 2018-09-02 DIAGNOSIS — M25522 Pain in left elbow: Secondary | ICD-10-CM | POA: Diagnosis not present

## 2018-09-02 DIAGNOSIS — F411 Generalized anxiety disorder: Secondary | ICD-10-CM

## 2018-09-02 DIAGNOSIS — E039 Hypothyroidism, unspecified: Secondary | ICD-10-CM | POA: Diagnosis not present

## 2018-09-02 DIAGNOSIS — M62830 Muscle spasm of back: Secondary | ICD-10-CM | POA: Diagnosis not present

## 2018-09-02 DIAGNOSIS — I10 Essential (primary) hypertension: Secondary | ICD-10-CM | POA: Diagnosis not present

## 2018-09-02 DIAGNOSIS — R609 Edema, unspecified: Secondary | ICD-10-CM | POA: Diagnosis not present

## 2018-09-02 DIAGNOSIS — N3281 Overactive bladder: Secondary | ICD-10-CM | POA: Diagnosis not present

## 2018-09-02 DIAGNOSIS — M1990 Unspecified osteoarthritis, unspecified site: Secondary | ICD-10-CM | POA: Diagnosis not present

## 2018-09-02 DIAGNOSIS — E1169 Type 2 diabetes mellitus with other specified complication: Secondary | ICD-10-CM | POA: Diagnosis not present

## 2018-09-02 DIAGNOSIS — E7800 Pure hypercholesterolemia, unspecified: Secondary | ICD-10-CM | POA: Diagnosis not present

## 2018-09-02 NOTE — Progress Notes (Signed)
   THERAPIST PROGRESS NOTE  Session Time: 2:30pm-3:25pm  Participation Level: Active  Behavioral Response: NeatAlertEuthymic  Type of Therapy: Individual Therapy  Treatment Goals addressed: Improve Psychiatric Symptoms, Elevate Mood, Improve Unhelpful Thought Patterns, decrease anxiety, learn about diagnosis, healthy coping skills   Interventions: Motivational Interviewing, CBT, Grounding & Mindfulness Techniques  Summary: Chau Savell is a 53  y.o. female who presents with Major Depressive Disorder, Recurrent, mild, and Generalized Anxiety Disorder  Suicidal/Homicidal: No -without intent/plan  Therapist Response: [name] met with clinician for an individual session. Naliah discussed her psychiatric symptoms and her current life events. Analissa shared that she has been feeling pretty good recently. She identified improvement in mood and coping with grief. Clinician explored recent activities and noted some motivation to clean out and organize her basement. Darcel reported having some sadness about getting rid of things owned by her mother, but also reported motivation to clean out the basement so she can start working on her room. She also identified plans to move her sewing and craft materials to the basement in order to have space to work and escape.   Plan: Return again in 4 weeks  Diagnosis:     Axis I: Major Depressive Disorder, Recurrent mild and Generalized Anxiety Disorder                             [name], LCSW 09/02/2018

## 2018-09-05 ENCOUNTER — Other Ambulatory Visit: Payer: Self-pay | Admitting: Family Medicine

## 2018-09-05 DIAGNOSIS — R945 Abnormal results of liver function studies: Principal | ICD-10-CM

## 2018-09-05 DIAGNOSIS — R7989 Other specified abnormal findings of blood chemistry: Secondary | ICD-10-CM

## 2018-09-11 ENCOUNTER — Ambulatory Visit
Admission: RE | Admit: 2018-09-11 | Discharge: 2018-09-11 | Disposition: A | Discharge status: Home / Self Care | Payer: Medicare HMO | Source: Ambulatory Visit | Attending: Family Medicine | Admitting: Family Medicine

## 2018-09-11 DIAGNOSIS — R7989 Other specified abnormal findings of blood chemistry: Secondary | ICD-10-CM | POA: Diagnosis not present

## 2018-09-11 DIAGNOSIS — R945 Abnormal results of liver function studies: Principal | ICD-10-CM

## 2018-09-19 ENCOUNTER — Ambulatory Visit (HOSPITAL_COMMUNITY): Payer: Medicare HMO | Admitting: Psychiatry

## 2018-09-26 ENCOUNTER — Encounter (HOSPITAL_COMMUNITY): Payer: Self-pay | Admitting: Psychiatry

## 2018-09-26 ENCOUNTER — Ambulatory Visit (INDEPENDENT_AMBULATORY_CARE_PROVIDER_SITE_OTHER): Payer: Medicare HMO | Admitting: Psychiatry

## 2018-09-26 ENCOUNTER — Other Ambulatory Visit: Payer: Self-pay

## 2018-09-26 VITALS — BP 130/90 | HR 85 | Ht 63.0 in | Wt 301.0 lb

## 2018-09-26 DIAGNOSIS — Z7189 Other specified counseling: Secondary | ICD-10-CM | POA: Diagnosis not present

## 2018-09-26 DIAGNOSIS — F411 Generalized anxiety disorder: Secondary | ICD-10-CM

## 2018-09-26 DIAGNOSIS — F332 Major depressive disorder, recurrent severe without psychotic features: Secondary | ICD-10-CM | POA: Diagnosis not present

## 2018-09-26 DIAGNOSIS — F99 Mental disorder, not otherwise specified: Secondary | ICD-10-CM | POA: Diagnosis not present

## 2018-09-26 DIAGNOSIS — F5105 Insomnia due to other mental disorder: Secondary | ICD-10-CM

## 2018-09-26 MED ORDER — MIRTAZAPINE 30 MG PO TABS: 60.0000 mg | ORAL_TABLET | Freq: Every day | ORAL | 2 refills | Status: DC

## 2018-09-26 MED ORDER — ESZOPICLONE 3 MG PO TABS: 3.0000 mg | ORAL_TABLET | Freq: Every day | ORAL | 2 refills | Status: DC

## 2018-09-26 MED ORDER — LITHIUM CARBONATE ER 300 MG PO TBCR: 600.0000 mg | EXTENDED_RELEASE_TABLET | Freq: Every day | ORAL | 2 refills | Status: DC

## 2018-09-26 MED ORDER — DULOXETINE HCL 60 MG PO CPEP: 60.0000 mg | ORAL_CAPSULE | Freq: Two times a day (BID) | ORAL | 2 refills | Status: DC

## 2018-09-26 MED ORDER — LORAZEPAM 1 MG PO TABS: 1.0000 mg | ORAL_TABLET | Freq: Two times a day (BID) | ORAL | 2 refills | Status: DC | PRN
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
## 2018-09-30 ENCOUNTER — Ambulatory Visit (INDEPENDENT_AMBULATORY_CARE_PROVIDER_SITE_OTHER): Payer: Medicare HMO | Admitting: Licensed Clinical Social Worker

## 2018-09-30 ENCOUNTER — Other Ambulatory Visit: Payer: Self-pay

## 2018-09-30 ENCOUNTER — Encounter (HOSPITAL_COMMUNITY): Payer: Self-pay | Admitting: Licensed Clinical Social Worker

## 2018-09-30 DIAGNOSIS — F4321 Adjustment disorder with depressed mood: Secondary | ICD-10-CM | POA: Diagnosis not present

## 2018-09-30 DIAGNOSIS — R69 Illness, unspecified: Secondary | ICD-10-CM | POA: Diagnosis not present

## 2018-09-30 DIAGNOSIS — F330 Major depressive disorder, recurrent, mild: Secondary | ICD-10-CM | POA: Diagnosis not present

## 2018-09-30 NOTE — Progress Notes (Signed)
   THERAPIST PROGRESS NOTE  Session Time: 2:30pm-3:15pm  Participation Level: Active  Behavioral Response: Well GroomedAlertEuthymic  Type of Therapy: Individual Therapy  Treatment Goals addressed: Improve Psychiatric Symptoms, Elevate Mood, Improve Unhelpful Thought Patterns, decrease anxiety, learn about diagnosis, healthy coping skills   Interventions: Motivational Interviewing, CBT, Grounding & Mindfulness Techniques  Summary: [name] is a 53  y.o. female who presents with Major Depressive Disorder, mild, grief Suicidal/Homicidal: No -without intent/plan  Therapist Response: [name] met with clinician for an individual session. [name] discussed her psychiatric symptoms and her current life events. [name] shared that her mother in law passed away earlier this month and she has been coping with some unexpected grief, as well as some relief. Clinician provided supportive counseling, as well as psychoeducation about the grief process. [name] reports she has been concerned about her husband's grief process, as he does not talk a lot about his feelings. However, she reports he will talk sometimes and he seems okay. [name] processed interactions with brother in law and noted that their relationship will likely never improve. However, she does not seem too bothered by this. [name] discussed her visit with her bio sister, who is 82 months younger than her. Clinician explored the context of their upbringing, identified that [name] had been adopted as a baby, prior to sister bring born. Clinician identified and commented on how similar they look and how well they get along.   Plan: Return again in 3-4 weeks  Diagnosis:     Axis I: Major Depressive Disorder, mild, grief  [name], LCSW 09/30/2018

## 2018-10-01 DIAGNOSIS — M25561 Pain in right knee: Secondary | ICD-10-CM | POA: Insufficient documentation

## 2018-10-01 DIAGNOSIS — Z471 Aftercare following joint replacement surgery: Secondary | ICD-10-CM | POA: Diagnosis not present

## 2018-10-01 DIAGNOSIS — Z96652 Presence of left artificial knee joint: Secondary | ICD-10-CM | POA: Diagnosis not present

## 2018-10-01 DIAGNOSIS — Z96653 Presence of artificial knee joint, bilateral: Secondary | ICD-10-CM | POA: Diagnosis not present

## 2018-10-01 DIAGNOSIS — Z96651 Presence of right artificial knee joint: Secondary | ICD-10-CM | POA: Diagnosis not present

## 2018-10-03 DIAGNOSIS — R69 Illness, unspecified: Secondary | ICD-10-CM | POA: Diagnosis not present

## 2018-10-11 DIAGNOSIS — E039 Hypothyroidism, unspecified: Secondary | ICD-10-CM | POA: Diagnosis not present

## 2018-10-15 DIAGNOSIS — M1990 Unspecified osteoarthritis, unspecified site: Secondary | ICD-10-CM | POA: Diagnosis not present

## 2018-10-15 DIAGNOSIS — M159 Polyosteoarthritis, unspecified: Secondary | ICD-10-CM | POA: Diagnosis not present

## 2018-10-15 DIAGNOSIS — E039 Hypothyroidism, unspecified: Secondary | ICD-10-CM | POA: Diagnosis not present

## 2018-10-15 DIAGNOSIS — E1169 Type 2 diabetes mellitus with other specified complication: Secondary | ICD-10-CM | POA: Diagnosis not present

## 2018-10-15 DIAGNOSIS — E785 Hyperlipidemia, unspecified: Secondary | ICD-10-CM | POA: Diagnosis not present

## 2018-10-15 DIAGNOSIS — R69 Illness, unspecified: Secondary | ICD-10-CM | POA: Diagnosis not present

## 2018-10-15 DIAGNOSIS — E1165 Type 2 diabetes mellitus with hyperglycemia: Secondary | ICD-10-CM | POA: Diagnosis not present

## 2018-10-15 DIAGNOSIS — I10 Essential (primary) hypertension: Secondary | ICD-10-CM | POA: Diagnosis not present

## 2018-10-31 ENCOUNTER — Encounter (HOSPITAL_COMMUNITY): Payer: Self-pay | Admitting: Licensed Clinical Social Worker

## 2018-10-31 ENCOUNTER — Other Ambulatory Visit: Payer: Self-pay

## 2018-10-31 ENCOUNTER — Ambulatory Visit (INDEPENDENT_AMBULATORY_CARE_PROVIDER_SITE_OTHER): Payer: Medicare HMO | Admitting: Licensed Clinical Social Worker

## 2018-10-31 DIAGNOSIS — F330 Major depressive disorder, recurrent, mild: Secondary | ICD-10-CM

## 2018-10-31 DIAGNOSIS — F411 Generalized anxiety disorder: Secondary | ICD-10-CM | POA: Diagnosis not present

## 2018-10-31 DIAGNOSIS — R69 Illness, unspecified: Secondary | ICD-10-CM | POA: Diagnosis not present

## 2018-10-31 NOTE — Progress Notes (Signed)
Virtual Visit via Telephone Note  I connected with [name] on 10/31/18 at  9:00 AM EDT by telephone and verified that I am speaking with the correct person using two identifiers.   I discussed the limitations, risks, security and privacy concerns of performing an evaluation and management service by telephone and the availability of in person appointments. I also discussed with the patient that there may be a patient responsible charge related to this service. The patient expressed understanding and agreed to proceed.   Type of Therapy: Individual Therapy   Treatment Goals addressed: Improve Psychiatric Symptoms, Elevate Mood, Improve Unhelpful Thought Patterns, decrease anxiety, learn about diagnosis, healthy coping skills   Interventions: Motivational Interviewing, CBT, Grounding & Mindfulness Techniques   Summary: [name] is a 53 y.o. female who presents with Major Depressive Disorder, Recurrent mild and Generalized Anxiety Disorder   Suicidal/Homicidal: No -without intent/plan   Therapist Response:  [name] met with clinician for an individual session. [name] discussed her psychiatric symptoms and her current life events. [name] shared that she continues to grieve the loss of her mother in law. However, she feels okay. She reports she has been staying home and hopes that everyone else will too. Clinician explored mood and physical health. [name] reports overall stability. She identifies concern that her mother in law may have died of COVID-15 and she wasn't tested due to her difficulty breathing and fever. Clinician processed those thoughts and explored the possibility, but also noted that this won't change anything. [name] reports she is glad that mother in law didn't have to live through this and that she is no longer in pain. Clinician processed husband's grief process and noted that they still have their moments, but that things have settled down.   Plan: Return again in 3-4  weeks   Diagnosis: Axis I: Major Depressive Disorder, Recurrent mild and Generalized Anxiety Disorder    I discussed the assessment and treatment plan with the patient. The patient was provided an opportunity to ask questions and all were answered. The patient agreed with the plan and demonstrated an understanding of the instructions.   The patient was advised to call back or seek an in-person evaluation if the symptoms worsen or if the condition fails to improve as anticipated.  I provided 45 minutes of non-face-to-face time during this encounter.   [name], LCSW

## 2018-11-14 DIAGNOSIS — E1169 Type 2 diabetes mellitus with other specified complication: Secondary | ICD-10-CM | POA: Diagnosis not present

## 2018-11-14 DIAGNOSIS — I10 Essential (primary) hypertension: Secondary | ICD-10-CM | POA: Diagnosis not present

## 2018-11-14 DIAGNOSIS — M159 Polyosteoarthritis, unspecified: Secondary | ICD-10-CM | POA: Diagnosis not present

## 2018-11-14 DIAGNOSIS — E039 Hypothyroidism, unspecified: Secondary | ICD-10-CM | POA: Diagnosis not present

## 2018-11-14 DIAGNOSIS — R69 Illness, unspecified: Secondary | ICD-10-CM | POA: Diagnosis not present

## 2018-11-14 DIAGNOSIS — E785 Hyperlipidemia, unspecified: Secondary | ICD-10-CM | POA: Diagnosis not present

## 2018-11-14 DIAGNOSIS — E1165 Type 2 diabetes mellitus with hyperglycemia: Secondary | ICD-10-CM | POA: Diagnosis not present

## 2018-11-14 DIAGNOSIS — M1990 Unspecified osteoarthritis, unspecified site: Secondary | ICD-10-CM | POA: Diagnosis not present

## 2018-11-22 DIAGNOSIS — R69 Illness, unspecified: Secondary | ICD-10-CM | POA: Diagnosis not present

## 2018-11-28 ENCOUNTER — Other Ambulatory Visit: Payer: Self-pay

## 2018-11-28 ENCOUNTER — Encounter (HOSPITAL_COMMUNITY): Payer: Self-pay | Admitting: Psychiatry

## 2018-11-28 ENCOUNTER — Ambulatory Visit (INDEPENDENT_AMBULATORY_CARE_PROVIDER_SITE_OTHER): Payer: Medicare HMO | Admitting: Psychiatry

## 2018-11-28 DIAGNOSIS — F5105 Insomnia due to other mental disorder: Secondary | ICD-10-CM

## 2018-11-28 DIAGNOSIS — I10 Essential (primary) hypertension: Secondary | ICD-10-CM | POA: Diagnosis not present

## 2018-11-28 DIAGNOSIS — F411 Generalized anxiety disorder: Secondary | ICD-10-CM | POA: Diagnosis not present

## 2018-11-28 DIAGNOSIS — E1169 Type 2 diabetes mellitus with other specified complication: Secondary | ICD-10-CM | POA: Diagnosis not present

## 2018-11-28 DIAGNOSIS — E1165 Type 2 diabetes mellitus with hyperglycemia: Secondary | ICD-10-CM | POA: Diagnosis not present

## 2018-11-28 DIAGNOSIS — M1990 Unspecified osteoarthritis, unspecified site: Secondary | ICD-10-CM | POA: Diagnosis not present

## 2018-11-28 DIAGNOSIS — F332 Major depressive disorder, recurrent severe without psychotic features: Secondary | ICD-10-CM

## 2018-11-28 DIAGNOSIS — M159 Polyosteoarthritis, unspecified: Secondary | ICD-10-CM | POA: Diagnosis not present

## 2018-11-28 DIAGNOSIS — E039 Hypothyroidism, unspecified: Secondary | ICD-10-CM | POA: Diagnosis not present

## 2018-11-28 DIAGNOSIS — R69 Illness, unspecified: Secondary | ICD-10-CM | POA: Diagnosis not present

## 2018-11-28 DIAGNOSIS — F99 Mental disorder, not otherwise specified: Secondary | ICD-10-CM | POA: Diagnosis not present

## 2018-11-28 DIAGNOSIS — E785 Hyperlipidemia, unspecified: Secondary | ICD-10-CM | POA: Diagnosis not present

## 2018-11-28 MED ORDER — ESZOPICLONE 2 MG PO TABS: 2.0000 mg | ORAL_TABLET | Freq: Every evening | ORAL | 2 refills | Status: DC | PRN

## 2018-11-28 MED ORDER — DULOXETINE HCL 60 MG PO CPEP: 60.0000 mg | ORAL_CAPSULE | Freq: Two times a day (BID) | ORAL | 2 refills | Status: DC

## 2018-11-28 MED ORDER — MIRTAZAPINE 30 MG PO TABS: 60.0000 mg | ORAL_TABLET | Freq: Every day | ORAL | 2 refills | Status: DC

## 2018-11-28 MED ORDER — LITHIUM CARBONATE ER 300 MG PO TBCR: 600.0000 mg | EXTENDED_RELEASE_TABLET | Freq: Every day | ORAL | 2 refills | Status: DC
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
## 2018-12-02 ENCOUNTER — Other Ambulatory Visit: Payer: Self-pay

## 2018-12-02 ENCOUNTER — Ambulatory Visit (INDEPENDENT_AMBULATORY_CARE_PROVIDER_SITE_OTHER): Payer: Medicare HMO | Admitting: Licensed Clinical Social Worker

## 2018-12-02 ENCOUNTER — Encounter (HOSPITAL_COMMUNITY): Payer: Self-pay | Admitting: Licensed Clinical Social Worker

## 2018-12-02 DIAGNOSIS — R69 Illness, unspecified: Secondary | ICD-10-CM | POA: Diagnosis not present

## 2018-12-02 DIAGNOSIS — F411 Generalized anxiety disorder: Secondary | ICD-10-CM

## 2018-12-02 DIAGNOSIS — F330 Major depressive disorder, recurrent, mild: Secondary | ICD-10-CM | POA: Diagnosis not present

## 2018-12-02 NOTE — Progress Notes (Signed)
Virtual Visit via Telephone Note  I connected with [name] on 12/02/18 at 10:00 AM EDT by telephone and verified that I am speaking with the correct person using two identifiers.    I discussed the limitations, risks, security and privacy concerns of performing an evaluation and management service by telephone and the availability of in person appointments. I also discussed with the patient that there may be a patient responsible charge related to this service. The patient expressed understanding and agreed to proceed.   Type of Therapy: Individual Therapy   Treatment Goals addressed: Improve Psychiatric Symptoms, Elevate Mood, Improve Unhelpful Thought Patterns, decrease anxiety, learn about diagnosis, healthy coping skills   Interventions: Motivational Interviewing, CBT, Grounding & Mindfulness Techniques   Summary: [name] is a 53 y.o. female who presents with Major Depressive Disorder, mild and Generalized Anxiety Disorder   Suicidal/Homicidal: No -without intent/plan   Therapist Response:  [name] met with clinician for an individual session. [name] discussed her psychiatric symptoms and her current life events. [name] shared that she has been doing pretty well overall. She reports her life has gotten better, in that she is able to do what she wants to do and she does not really have to answer to anybody or take care of anyone other than her husband and the dogs. Clinician processed grief related to mother-in-law's death. She reported that she has made peace with that and she felt a little sad on Mother's day because they did not go to the cemetary. However, she reported the Monday after was a very hard day and she cried on and off throughout the day. Clinician processed this and noted the balance and need for ongoing tears in order to get the grief out. Clinician discussed relationship with granddaughter's mother and explored ways to cope with their current rift. Clinician  also ensured that whatever needed to be done in order to spend time with granddaughter would be done by [name].  [name] reports improvement in anxiety and most depressive sxs are not present at this time.   Plan: Return again in 3-4 weeks   Diagnosis: Axis I: Major Depressive Disorder, Recurrent mild, and Generalized Anxiety Disorder   I discussed the assessment and treatment plan with the patient. The patient was provided an opportunity to ask questions and all were answered. The patient agreed with the plan and demonstrated an understanding of the instructions.   The patient was advised to call back or seek an in-person evaluation if the symptoms worsen or if the condition fails to improve as anticipated.  I provided 55 minutes of non-face-to-face time during this encounter.   [name], LCSW

## 2018-12-14 DIAGNOSIS — E1169 Type 2 diabetes mellitus with other specified complication: Secondary | ICD-10-CM | POA: Diagnosis not present

## 2018-12-14 DIAGNOSIS — E785 Hyperlipidemia, unspecified: Secondary | ICD-10-CM | POA: Diagnosis not present

## 2018-12-14 DIAGNOSIS — M159 Polyosteoarthritis, unspecified: Secondary | ICD-10-CM | POA: Diagnosis not present

## 2018-12-14 DIAGNOSIS — R69 Illness, unspecified: Secondary | ICD-10-CM | POA: Diagnosis not present

## 2018-12-14 DIAGNOSIS — I10 Essential (primary) hypertension: Secondary | ICD-10-CM | POA: Diagnosis not present

## 2018-12-14 DIAGNOSIS — M1990 Unspecified osteoarthritis, unspecified site: Secondary | ICD-10-CM | POA: Diagnosis not present

## 2018-12-14 DIAGNOSIS — E1165 Type 2 diabetes mellitus with hyperglycemia: Secondary | ICD-10-CM | POA: Diagnosis not present

## 2018-12-14 DIAGNOSIS — E039 Hypothyroidism, unspecified: Secondary | ICD-10-CM | POA: Diagnosis not present

## 2018-12-23 DIAGNOSIS — E039 Hypothyroidism, unspecified: Secondary | ICD-10-CM | POA: Diagnosis not present

## 2018-12-25 DIAGNOSIS — Z794 Long term (current) use of insulin: Secondary | ICD-10-CM | POA: Diagnosis not present

## 2018-12-25 DIAGNOSIS — E785 Hyperlipidemia, unspecified: Secondary | ICD-10-CM | POA: Diagnosis not present

## 2018-12-25 DIAGNOSIS — I10 Essential (primary) hypertension: Secondary | ICD-10-CM | POA: Diagnosis not present

## 2018-12-25 DIAGNOSIS — F4323 Adjustment disorder with mixed anxiety and depressed mood: Secondary | ICD-10-CM | POA: Diagnosis not present

## 2018-12-25 DIAGNOSIS — G8929 Other chronic pain: Secondary | ICD-10-CM | POA: Diagnosis not present

## 2018-12-25 DIAGNOSIS — E039 Hypothyroidism, unspecified: Secondary | ICD-10-CM | POA: Diagnosis not present

## 2018-12-25 DIAGNOSIS — J45909 Unspecified asthma, uncomplicated: Secondary | ICD-10-CM | POA: Diagnosis not present

## 2018-12-25 DIAGNOSIS — R69 Illness, unspecified: Secondary | ICD-10-CM | POA: Diagnosis not present

## 2018-12-25 DIAGNOSIS — E1165 Type 2 diabetes mellitus with hyperglycemia: Secondary | ICD-10-CM | POA: Diagnosis not present

## 2018-12-30 ENCOUNTER — Ambulatory Visit (INDEPENDENT_AMBULATORY_CARE_PROVIDER_SITE_OTHER): Payer: Self-pay | Admitting: Licensed Clinical Social Worker

## 2018-12-30 ENCOUNTER — Other Ambulatory Visit: Payer: Self-pay

## 2018-12-30 DIAGNOSIS — Z5329 Procedure and treatment not carried out because of patient's decision for other reasons: Secondary | ICD-10-CM

## 2018-12-30 DIAGNOSIS — Z91199 Patient's noncompliance with other medical treatment and regimen due to unspecified reason: Secondary | ICD-10-CM

## 2018-12-30 NOTE — Progress Notes (Signed)
Client no showed to this appointment. Clinician attempted to contact via phone 3 times between 10am-10:20am with no success. Message left to reschedule.

## 2019-01-23 DIAGNOSIS — Z6841 Body Mass Index (BMI) 40.0 and over, adult: Secondary | ICD-10-CM | POA: Diagnosis not present

## 2019-01-23 DIAGNOSIS — M5412 Radiculopathy, cervical region: Secondary | ICD-10-CM | POA: Diagnosis not present

## 2019-01-23 DIAGNOSIS — I10 Essential (primary) hypertension: Secondary | ICD-10-CM | POA: Diagnosis not present

## 2019-01-23 DIAGNOSIS — J3800 Paralysis of vocal cords and larynx, unspecified: Secondary | ICD-10-CM | POA: Diagnosis not present

## 2019-01-27 DIAGNOSIS — M1990 Unspecified osteoarthritis, unspecified site: Secondary | ICD-10-CM | POA: Diagnosis not present

## 2019-01-27 DIAGNOSIS — E1165 Type 2 diabetes mellitus with hyperglycemia: Secondary | ICD-10-CM | POA: Diagnosis not present

## 2019-01-27 DIAGNOSIS — E1169 Type 2 diabetes mellitus with other specified complication: Secondary | ICD-10-CM | POA: Diagnosis not present

## 2019-01-27 DIAGNOSIS — I10 Essential (primary) hypertension: Secondary | ICD-10-CM | POA: Diagnosis not present

## 2019-01-27 DIAGNOSIS — E7800 Pure hypercholesterolemia, unspecified: Secondary | ICD-10-CM | POA: Diagnosis not present

## 2019-01-27 DIAGNOSIS — R69 Illness, unspecified: Secondary | ICD-10-CM | POA: Diagnosis not present

## 2019-01-27 DIAGNOSIS — E785 Hyperlipidemia, unspecified: Secondary | ICD-10-CM | POA: Diagnosis not present

## 2019-01-27 DIAGNOSIS — M159 Polyosteoarthritis, unspecified: Secondary | ICD-10-CM | POA: Diagnosis not present

## 2019-01-27 DIAGNOSIS — E039 Hypothyroidism, unspecified: Secondary | ICD-10-CM | POA: Diagnosis not present

## 2019-01-29 DIAGNOSIS — R69 Illness, unspecified: Secondary | ICD-10-CM | POA: Diagnosis not present

## 2019-02-13 ENCOUNTER — Other Ambulatory Visit: Payer: Self-pay

## 2019-02-13 ENCOUNTER — Telehealth: Payer: Self-pay | Admitting: Neurology

## 2019-02-13 ENCOUNTER — Ambulatory Visit: Payer: Medicare HMO | Admitting: Neurology

## 2019-02-13 ENCOUNTER — Encounter: Payer: Self-pay | Admitting: Neurology

## 2019-02-13 VITALS — BP 141/100 | HR 94 | Ht 63.0 in | Wt 297.0 lb

## 2019-02-13 DIAGNOSIS — G4733 Obstructive sleep apnea (adult) (pediatric): Secondary | ICD-10-CM | POA: Diagnosis not present

## 2019-02-13 DIAGNOSIS — R635 Abnormal weight gain: Secondary | ICD-10-CM | POA: Diagnosis not present

## 2019-02-13 DIAGNOSIS — G4489 Other headache syndrome: Secondary | ICD-10-CM | POA: Diagnosis not present

## 2019-02-13 DIAGNOSIS — G4486 Cervicogenic headache: Secondary | ICD-10-CM

## 2019-02-13 DIAGNOSIS — R51 Headache: Secondary | ICD-10-CM

## 2019-02-13 DIAGNOSIS — Z9989 Dependence on other enabling machines and devices: Secondary | ICD-10-CM | POA: Diagnosis not present

## 2019-02-13 NOTE — Patient Instructions (Signed)
As discussed, your headaches are likely coming from neck muscle tightness, as you described neck discomfort and radiating pain upwards, I do not believe you have migraines.  Headaches can also be caused by suboptimally treated or untreated sleep apnea.  Since you have not had a reevaluation of your sleep apnea and reports significant weight gain in the past few years, I would recommend the following approach: Brain MRI with and without contrast to rule out any structural cause of your headache.  Talk to your orthopedic surgeon about the possibility of doing neck injection, you have seen Dr. Nelva Bush before.I think a local approach would be better than a systemic approach as you already take multiple medications and potentially sedating medications, some of your medications we even use for migraine prevention.I would also like to proceed with a sleep study to reevaluate your sleep apnea.We will call you to schedule your brain scan and sleep study.  We will keep you posted as to the results by phone and follow you afterwards.  I do not have any recent results on your kidney function, I would like to order a blood test before you leave today.
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
## 2019-02-13 NOTE — Telephone Encounter (Signed)
[employer] medicare order sent to [facility]. They will obtain the auth and reach out to the patient to schedule.  °

## 2019-02-17 DIAGNOSIS — R490 Dysphonia: Secondary | ICD-10-CM | POA: Insufficient documentation

## 2019-02-17 DIAGNOSIS — Z6841 Body Mass Index (BMI) 40.0 and over, adult: Secondary | ICD-10-CM | POA: Diagnosis not present

## 2019-02-17 DIAGNOSIS — R1314 Dysphagia, pharyngoesophageal phase: Secondary | ICD-10-CM | POA: Diagnosis not present

## 2019-02-17 DIAGNOSIS — G4733 Obstructive sleep apnea (adult) (pediatric): Secondary | ICD-10-CM | POA: Diagnosis not present

## 2019-02-17 DIAGNOSIS — Z8739 Personal history of other diseases of the musculoskeletal system and connective tissue: Secondary | ICD-10-CM | POA: Diagnosis not present

## 2019-02-17 DIAGNOSIS — K219 Gastro-esophageal reflux disease without esophagitis: Secondary | ICD-10-CM | POA: Insufficient documentation

## 2019-02-17 DIAGNOSIS — K148 Other diseases of tongue: Secondary | ICD-10-CM | POA: Diagnosis not present

## 2019-02-20 ENCOUNTER — Other Ambulatory Visit: Payer: Self-pay

## 2019-02-20 ENCOUNTER — Ambulatory Visit (INDEPENDENT_AMBULATORY_CARE_PROVIDER_SITE_OTHER): Payer: Medicare HMO | Admitting: Psychiatry

## 2019-02-20 ENCOUNTER — Encounter (HOSPITAL_COMMUNITY): Payer: Self-pay | Admitting: Psychiatry

## 2019-02-20 DIAGNOSIS — F5105 Insomnia due to other mental disorder: Secondary | ICD-10-CM | POA: Diagnosis not present

## 2019-02-20 DIAGNOSIS — F99 Mental disorder, not otherwise specified: Secondary | ICD-10-CM

## 2019-02-20 DIAGNOSIS — F411 Generalized anxiety disorder: Secondary | ICD-10-CM | POA: Diagnosis not present

## 2019-02-20 DIAGNOSIS — R69 Illness, unspecified: Secondary | ICD-10-CM | POA: Diagnosis not present

## 2019-02-20 DIAGNOSIS — F332 Major depressive disorder, recurrent severe without psychotic features: Secondary | ICD-10-CM

## 2019-02-20 MED ORDER — LORAZEPAM 1 MG PO TABS: 1.0000 mg | ORAL_TABLET | Freq: Two times a day (BID) | ORAL | 1 refills | Status: DC

## 2019-02-20 MED ORDER — LITHIUM CARBONATE ER 300 MG PO TBCR: 600.0000 mg | EXTENDED_RELEASE_TABLET | Freq: Every day | ORAL | 2 refills | Status: DC

## 2019-02-20 MED ORDER — ESZOPICLONE 2 MG PO TABS: 2.0000 mg | ORAL_TABLET | Freq: Every evening | ORAL | 1 refills | Status: DC | PRN

## 2019-02-20 MED ORDER — DULOXETINE HCL 60 MG PO CPEP: 60.0000 mg | ORAL_CAPSULE | Freq: Two times a day (BID) | ORAL | 2 refills | Status: DC

## 2019-02-20 MED ORDER — MIRTAZAPINE 30 MG PO TABS: 60.0000 mg | ORAL_TABLET | Freq: Every day | ORAL | 2 refills | Status: DC

## 2019-02-20 NOTE — Progress Notes (Signed)
Virtual Visit via Telephone Note  I connected with [name] on 02/20/19 at 10:30 AM EDT by telephone and verified that I am speaking with the correct person using two identifiers.  Location: Patient: home Provider: office   I discussed the limitations, risks, security and privacy concerns of performing an evaluation and management service by telephone and the availability of in person appointments. I also discussed with the patient that there may be a patient responsible charge related to this service. The patient expressed understanding and agreed to proceed.   History of Present Illness: "Good. Real good. I feel really good".  "No bouts with depression or irritability or anxiety". She has some sad days when she misses her mother or MIL.  Pt denies SI/HI/AVH. Her sleep is poor because she has missing her [name] but she is taking her other meds. Pt denies manic and hypomanic like symptoms. Pt is dealing with frustrations with family but thinks she is handling it well. Her relationship with her husband is good. She has been staying home mostly.     Observations/Objective: I spoke with [name] on the phone.  Pt was calm, pleasant and cooperative.  Pt was engaged in the conversation and answered questions appropriately.  Speech was clear and coherent with normal rate, tone and volume.  Mood is euthymic and affect is full.  Thought processes are coherent, goal oriented and intact.  Thought content is logical.  Pt denies SI/HI.   Pt denies auditory and visual hallucinations and did not appear to be responding to internal stimuli.  Memory and concentration are good.  Fund of knowledge and use of language are average.  Insight and judgment are fair.  I am unable to comment on psychomotor activity, general appearance, hygiene, or eye contact as I was unable to physically see the patient on the phone.  Vital signs not available since interview conducted virtually.    Assessment and Plan:  MDD-recurrent, severe without psychotic features versus bipolar disorder- significantly improved; GAD-significantly improved; insomnia-ongoing; cluster B traits; rule out PTSD  Lunesta 2 mg p.o. nightly as needed insomnia-plan to wean off.  Patient is using her CPAP now.  Cymbalta 60 mg p.o. twice daily  Remeron 60 mg p.o. nightly  Ativan 1 mg p.o. twice daily as needed anxiety  Lithobid 600 mg p.o. daily  I will order labs at next visit  Follow Up Instructions: In 3 months or sooner if needed   I discussed the assessment and treatment plan with the patient. The patient was provided an opportunity to ask questions and all were answered. The patient agreed with the plan and demonstrated an understanding of the instructions.   The patient was advised to call back or seek an in-person evaluation if the symptoms worsen or if the condition fails to improve as anticipated.  I provided 15 minutes of non-face-to-face time during this encounter.   Charlcie Cradle, MD

## 2019-02-21 DIAGNOSIS — H52209 Unspecified astigmatism, unspecified eye: Secondary | ICD-10-CM | POA: Diagnosis not present

## 2019-02-21 DIAGNOSIS — H5203 Hypermetropia, bilateral: Secondary | ICD-10-CM | POA: Diagnosis not present

## 2019-02-21 DIAGNOSIS — H524 Presbyopia: Secondary | ICD-10-CM | POA: Diagnosis not present

## 2019-02-21 DIAGNOSIS — Z0100 Encounter for examination of eyes and vision without abnormal findings: Secondary | ICD-10-CM | POA: Diagnosis not present

## 2019-03-03 ENCOUNTER — Ambulatory Visit (INDEPENDENT_AMBULATORY_CARE_PROVIDER_SITE_OTHER): Payer: Medicare HMO | Admitting: Licensed Clinical Social Worker

## 2019-03-03 ENCOUNTER — Other Ambulatory Visit: Payer: Self-pay

## 2019-03-03 ENCOUNTER — Encounter (HOSPITAL_COMMUNITY): Payer: Self-pay | Admitting: Licensed Clinical Social Worker

## 2019-03-03 DIAGNOSIS — F411 Generalized anxiety disorder: Secondary | ICD-10-CM | POA: Diagnosis not present

## 2019-03-03 DIAGNOSIS — F331 Major depressive disorder, recurrent, moderate: Secondary | ICD-10-CM

## 2019-03-03 DIAGNOSIS — R69 Illness, unspecified: Secondary | ICD-10-CM | POA: Diagnosis not present
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
## 2019-03-05 DIAGNOSIS — E785 Hyperlipidemia, unspecified: Secondary | ICD-10-CM | POA: Diagnosis not present

## 2019-03-05 DIAGNOSIS — E1165 Type 2 diabetes mellitus with hyperglycemia: Secondary | ICD-10-CM | POA: Diagnosis not present

## 2019-03-05 DIAGNOSIS — R69 Illness, unspecified: Secondary | ICD-10-CM | POA: Diagnosis not present

## 2019-03-05 DIAGNOSIS — E1169 Type 2 diabetes mellitus with other specified complication: Secondary | ICD-10-CM | POA: Diagnosis not present

## 2019-03-05 DIAGNOSIS — E039 Hypothyroidism, unspecified: Secondary | ICD-10-CM | POA: Diagnosis not present

## 2019-03-05 DIAGNOSIS — I10 Essential (primary) hypertension: Secondary | ICD-10-CM | POA: Diagnosis not present

## 2019-03-05 DIAGNOSIS — E7800 Pure hypercholesterolemia, unspecified: Secondary | ICD-10-CM | POA: Diagnosis not present

## 2019-03-05 DIAGNOSIS — M1990 Unspecified osteoarthritis, unspecified site: Secondary | ICD-10-CM | POA: Diagnosis not present

## 2019-03-05 DIAGNOSIS — M159 Polyosteoarthritis, unspecified: Secondary | ICD-10-CM | POA: Diagnosis not present

## 2019-03-09 ENCOUNTER — Ambulatory Visit (INDEPENDENT_AMBULATORY_CARE_PROVIDER_SITE_OTHER): Payer: Medicare HMO | Admitting: Neurology

## 2019-03-09 DIAGNOSIS — G4733 Obstructive sleep apnea (adult) (pediatric): Secondary | ICD-10-CM

## 2019-03-09 DIAGNOSIS — G4489 Other headache syndrome: Secondary | ICD-10-CM

## 2019-03-09 DIAGNOSIS — R635 Abnormal weight gain: Secondary | ICD-10-CM

## 2019-03-09 DIAGNOSIS — G4720 Circadian rhythm sleep disorder, unspecified type: Secondary | ICD-10-CM

## 2019-03-09 DIAGNOSIS — G4486 Cervicogenic headache: Secondary | ICD-10-CM

## 2019-03-09 DIAGNOSIS — Z9989 Dependence on other enabling machines and devices: Secondary | ICD-10-CM

## 2019-03-12 NOTE — Telephone Encounter (Signed)
[employer] medicare Jessica KaufmannZN:[id number] (exp. 03/11/19 to 09/07/19) patient is scheduled at [facility] for 03/14/19.

## 2019-03-14 ENCOUNTER — Other Ambulatory Visit: Payer: Self-pay

## 2019-03-14 ENCOUNTER — Ambulatory Visit
Admission: RE | Admit: 2019-03-14 | Discharge: 2019-03-14 | Disposition: A | Discharge status: Home / Self Care | Payer: Medicare HMO | Source: Ambulatory Visit | Attending: Neurology | Admitting: Neurology

## 2019-03-14 DIAGNOSIS — G4489 Other headache syndrome: Secondary | ICD-10-CM | POA: Diagnosis not present

## 2019-03-14 MED ORDER — GADOBENATE DIMEGLUMINE 529 MG/ML IV SOLN
20.0000 mL | Freq: Once | INTRAVENOUS | Status: AC | PRN
  Administered 2019-03-14: 20 mL via INTRAVENOUS

## 2019-03-17 ENCOUNTER — Encounter (HOSPITAL_COMMUNITY): Payer: Self-pay | Admitting: Licensed Clinical Social Worker

## 2019-03-17 ENCOUNTER — Other Ambulatory Visit: Payer: Self-pay

## 2019-03-17 ENCOUNTER — Telehealth: Payer: Self-pay

## 2019-03-17 ENCOUNTER — Ambulatory Visit (INDEPENDENT_AMBULATORY_CARE_PROVIDER_SITE_OTHER): Payer: Medicare HMO | Admitting: Licensed Clinical Social Worker

## 2019-03-17 DIAGNOSIS — M25522 Pain in left elbow: Secondary | ICD-10-CM | POA: Diagnosis not present

## 2019-03-17 DIAGNOSIS — I10 Essential (primary) hypertension: Secondary | ICD-10-CM | POA: Diagnosis not present

## 2019-03-17 DIAGNOSIS — R69 Illness, unspecified: Secondary | ICD-10-CM | POA: Diagnosis not present

## 2019-03-17 DIAGNOSIS — N3281 Overactive bladder: Secondary | ICD-10-CM | POA: Diagnosis not present

## 2019-03-17 DIAGNOSIS — R609 Edema, unspecified: Secondary | ICD-10-CM | POA: Diagnosis not present

## 2019-03-17 DIAGNOSIS — E1169 Type 2 diabetes mellitus with other specified complication: Secondary | ICD-10-CM | POA: Diagnosis not present

## 2019-03-17 DIAGNOSIS — F330 Major depressive disorder, recurrent, mild: Secondary | ICD-10-CM

## 2019-03-17 DIAGNOSIS — E039 Hypothyroidism, unspecified: Secondary | ICD-10-CM | POA: Diagnosis not present

## 2019-03-17 DIAGNOSIS — E7800 Pure hypercholesterolemia, unspecified: Secondary | ICD-10-CM | POA: Diagnosis not present

## 2019-03-17 DIAGNOSIS — M62830 Muscle spasm of back: Secondary | ICD-10-CM | POA: Diagnosis not present

## 2019-03-17 DIAGNOSIS — M1990 Unspecified osteoarthritis, unspecified site: Secondary | ICD-10-CM | POA: Diagnosis not present

## 2019-03-17 NOTE — Progress Notes (Signed)
Patient referred by Dr. Zada Finders, seen by me on 02/13/19, PSG on 03/09/19. Please call and notify the patient that the recent sleep study showed severe obstructive sleep apnea, she had absence of REM sleep. I recommend treatment for this in the form of CPAP, so to optimize her treatment and to get her a new set of equipment. This will require a repeat sleep study for proper titration and mask fitting and correct monitoring of the oxygen saturations. Please explain to patient. I have placed an order in the chart. Thanks.  Jessica Age, MD, [name] [facility])

## 2019-03-17 NOTE — Progress Notes (Signed)
Virtual Visit via Telephone Note  I connected with [name] on 03/17/19 at 12:30 PM EDT by telephone and verified that I am speaking with the correct person using two identifiers.     I discussed the limitations, risks, security and privacy concerns of performing an evaluation and management service by telephone and the availability of in person appointments. I also discussed with the patient that there may be a patient responsible charge related to this service. The patient expressed understanding and agreed to proceed.  Type of Therapy: Individual Therapy  Treatment Goals addressed: Improve Psychiatric Symptoms, Elevate Mood, Improve Unhelpful Thought Patterns, decrease anxiety, learn about diagnosis, healthy coping skills  Interventions: Motivational Interviewing, CBT, Grounding & Mindfulness Techniques  Summary: [name] is a 53 y.o. female who presents with Major Depressive Disorder, Recurrent Severe, without psychotic features, and Generalized Anxiety Disorder  Suicidal/Homicidal: No -without intent/plan  Therapist Response:  [name] met with clinician for an individual session. [name] discussed her psychiatric symptoms and her current life events. [name] shared that she has been doing okay. She reports she continues to have ongoing drama with her niece, which [name] is trying very hard to move on from. However, she reports it is hard to not be upset, especially regarding how it effects her granddaughter. Clinician reflected thoughts and feelings using MI. Clinician noted that the most important part is her relationship with her granddaughter and wanting to be a part of her life as much as possible. [name] agreed and reported she is helping with school work as often as she can.  [name] also reports she is caregiving/sitting with a woman down the street. She reports she is happy to have something to do every day and she sees herself as a good caregiver. Clinician noted that her  experience and her ability to be patient and cheerful with others is a huge strength. Clinician validated that choice to continue doing this. [name] reports it is easier caring for someone who is not her mother or mother-in-law.   Plan: Return again in 3 months  Diagnosis: Axis I: Major Depressive Disorder, Recurrent Severe, without psychotic features, and Generalized Anxiety Disorder   I discussed the assessment and treatment plan with the patient. The patient was provided an opportunity to ask questions and all were answered. The patient agreed with the plan and demonstrated an understanding of the instructions.   The patient was advised to call back or seek an in-person evaluation if the symptoms worsen or if the condition fails to improve as anticipated.  I provided 45 minutes of non-face-to-face time during this encounter.   [name], LCSW

## 2019-03-17 NOTE — Telephone Encounter (Signed)
-----   Message from [name], MD sent at 03/17/2019  1:35 PM EDT ----- Please advise patient that her brain MRI with and without contrast showed no new findings, stable since 2014.  She has what is called a empty sella finding, Which was seen in 2014 and appears unchanged, nevertheless, this is a finding, which can be seen in patients who have elevated Cerebrospinal fluid pressure. While we do not have to jump to a invasive procedure to check her cerebrospinal fluid pressure, I would recommend a full, dilated eye examination with an ophthalmologist.  I would recommend that she schedule with an actual Ophthalmologist for this appointment.  Sometimes there are signs of fluid pressure on the eye examination which then prompts further evaluation for actual fluid pressure testing with a spinal fluid test called lumbar puncture.  [name]

## 2019-03-17 NOTE — Addendum Note (Signed)
Addended by: [name] on: 03/17/2019 08:48 AM   Modules accepted: Orders

## 2019-03-17 NOTE — Telephone Encounter (Signed)
I called pt. I advised pt that Dr. Rexene Alberts reviewed their sleep study results and found that pt has severe osa and recommends that pt be treated with a cpap. Dr. Rexene Alberts recommends that pt return for a repeat sleep study in order to properly titrate the cpap and ensure a good mask fit. Pt is agreeable to returning for a titration study. I advised pt that our sleep lab will file with pt's insurance and call pt to schedule the sleep study when we hear back from the pt's insurance regarding coverage of this sleep study. Pt verbalized understanding of results. Pt had no questions at this time but was encouraged to call back if questions arise.  I also discussed pt's MRI results. Pt sees Dr. Katy Fitch yearly for her dilated eye exam. Pt will call his office and see if she can get another full dilated eye exam. Pt verbalized understanding of results. Pt had no questions at this time but was encouraged to call back if questions arise.

## 2019-03-17 NOTE — Procedures (Signed)
PATIENT'S NAME:  [name], [name] DOB:      10/19/1965      MR#:    [id number]     DATE OF RECORDING: 03/09/2019 REFERRING M.D.:  Emelda Brothers, MD Study Performed:   Baseline Polysomnogram HISTORY: 53 year old woman with a history of hypertension, hyperlipidemia, hypothyroidism, history of mood disorder including depression, anxiety, history of asthma, allergies, diabetes, fibromyalgia, morbid obesity, obstructive sleep apnea, on CPAP, who reports recurrent headaches that have been ongoing since early July. She is status post C7-T1 ACDF in September last year. She has not had any reevaluation of her sleep in over 5 or 6 years. The patient's weight 297 pounds with a height of 63 (inches), resulting in a BMI of 52.7 kg/m2.  The patient's neck circumference measured 18.2 inches.  CURRENT MEDICATIONS: Lipitor, Bioton, Flexeril, Cymbalta, Lunesta, Lasix, Indocin, Synthroid, Prinivil, Lithobid, Glucophage, Remeron, Multivitamin, Omega-3, Ditropan-XL, Vitamin B6.   PROCEDURE:  This is a multichannel digital polysomnogram utilizing the Somnostar 11.2 system.  Electrodes and sensors were applied and monitored per AASM Specifications.   EEG, EOG, Chin and Limb EMG, were sampled at 200 Hz.  ECG, Snore and Nasal Pressure, Thermal Airflow, Respiratory Effort, CPAP Flow and Pressure, Oximetry was sampled at 50 Hz. Digital video and audio were recorded.      BASELINE STUDY  Lights Out was at 21:13 and Lights On at 04:55.  Total recording time (TRT) was 462.5 minutes, with a total sleep time (TST) of 315.5 minutes. The patient's sleep latency was 46 minutes, which is delayed. REM latency was absent. The sleep efficiency was 68.2%.     SLEEP ARCHITECTURE: WASO (Wake after sleep onset) was 105 minutes with moderate sleep fragmentation noted and one longer period of wakefulness. There were 8 minutes in Stage N1, 246 minutes Stage N2, 61.5 minutes Stage N3 and 0 minutes in Stage REM. The percentage of Stage N1 was  2.5%, Stage N2 was 78.%, which is increased, Stage N3 was 19.5%, which is normal, and Stage R (REM sleep) was absent. The arousals were noted as: 136 were spontaneous, 33 were associated with PLMs, 280 were associated with respiratory events.  RESPIRATORY ANALYSIS:  There were a total of 453 respiratory events:  83 obstructive apneas, 3 central apneas and 1 mixed apneas with a total of 87 apneas and an apnea index (AI) of 16.5 /hour. There were 366 hypopneas with a hypopnea index of 69.6 /hour. The patient also had 0 respiratory event related arousals (RERAs).      The total APNEA/HYPOPNEA INDEX (AHI) was 86.1/hour and the total RESPIRATORY DISTURBANCE INDEX was 86.1 /hour.  0 events occurred in REM sleep and 736 events in NREM. The REM AHI was n/a, versus a non-REM AHI of 86.1. The patient spent 101.5 minutes of total sleep time in the supine position and 214 minutes in non-supine.. The supine AHI was 87.4 versus a non-supine AHI of 85.5.  OXYGEN SATURATION & C02:  The Wake baseline 02 saturation was 92%, with the lowest being 73%. Time spent below 89% saturation equaled 134 minutes.  PERIODIC LIMB MOVEMENTS: The patient had a total of 58 Periodic Limb Movements.  The Periodic Limb Movement (PLM) index was 11. and the PLM Arousal index was 6.3/hour.  Audio and video analysis did not show any abnormal or unusual movements, behaviors, phonations or vocalizations. The patient took 2 bathroom breaks. Mild to moderate snoring was noted. The EKG was in keeping with normal sinus rhythm (NSR). Post-study, the patient indicated that sleep  was worse than usual.   IMPRESSION: 1. Severe Obstructive Sleep Apnea (OSA) 2. Dysfunctions associated with sleep stages or arousals from sleep   RECOMMENDATIONS: 1. This study demonstrates severe obstructive sleep apnea, with a total AHI of 86.1/hour and O2 nadir of 73%. The absence of REM sleep likely underestimates her AHI and O2 nadir. Treatment with positive airway  pressure in the form of CPAP is recommended. This will require a full night titration study to optimize therapy. Other treatment options may include - generally speaking - avoidance of supine sleep position along with weight loss, upper airway or jaw surgery in selected patients or the use of an oral appliance in certain patients. ENT evaluation and/or consultation with a maxillofacial surgeon or dentist may be feasible in some instances.    2. Please note that untreated obstructive sleep apnea may carry additional perioperative morbidity. Patients with significant obstructive sleep apnea should receive perioperative PAP therapy and the surgeons and particularly the anesthesiologist should be informed of the diagnosis and the severity of the sleep disordered breathing. 3. This study shows sleep fragmentation and abnormal sleep stage percentages; these are nonspecific findings and per se do not signify an intrinsic sleep disorder or a cause for the patient's sleep-related symptoms. Causes include (but are not limited to) the first night effect of the sleep study, circadian rhythm disturbances, medication effect or an underlying mood disorder or medical problem.  4. The patient should be cautioned not to drive, work at heights, or operate dangerous or heavy equipment when tired or sleepy. Review and reiteration of good sleep hygiene measures should be pursued with any patient. 5. The patient will be seen in follow-up in the sleep clinic at [facility] for discussion of the test results, symptom and treatment compliance review, further management strategies, etc. The referring provider will be notified of the test results.  I certify that I have reviewed the entire raw data recording prior to the issuance of this report in accordance with the Standards of Accreditation of the American Academy of Sleep Medicine (AASM)    [name], MD, [name] Diplomat, American Board of Neurology and Sleep Medicine ( Neurology and  Sleep Medicine)

## 2019-03-17 NOTE — Telephone Encounter (Signed)
-----   Message from [name], MD sent at 03/17/2019  8:48 AM EDT ----- Patient referred by Dr. Zada Finders, seen by me on 02/13/19, PSG on 03/09/19. Please call and notify the patient that the recent sleep study showed severe obstructive sleep apnea, she had absence of REM sleep. I recommend treatment for this in the form of CPAP, so to optimize her treatment and to get her a new set of equipment. This will require a repeat sleep study for proper titration and mask fitting and correct monitoring of the oxygen saturations. Please explain to patient. I have placed an order in the chart. Thanks.  [name], MD, [name] [facility])

## 2019-03-20 DIAGNOSIS — E039 Hypothyroidism, unspecified: Secondary | ICD-10-CM | POA: Diagnosis not present

## 2019-03-20 DIAGNOSIS — E7800 Pure hypercholesterolemia, unspecified: Secondary | ICD-10-CM | POA: Diagnosis not present

## 2019-03-20 DIAGNOSIS — Z23 Encounter for immunization: Secondary | ICD-10-CM | POA: Diagnosis not present

## 2019-03-20 DIAGNOSIS — E1169 Type 2 diabetes mellitus with other specified complication: Secondary | ICD-10-CM | POA: Diagnosis not present

## 2019-03-28 DIAGNOSIS — E119 Type 2 diabetes mellitus without complications: Secondary | ICD-10-CM | POA: Diagnosis not present

## 2019-03-28 DIAGNOSIS — Z961 Presence of intraocular lens: Secondary | ICD-10-CM | POA: Diagnosis not present

## 2019-03-28 DIAGNOSIS — Z794 Long term (current) use of insulin: Secondary | ICD-10-CM | POA: Diagnosis not present

## 2019-03-28 DIAGNOSIS — H4711 Papilledema associated with increased intracranial pressure: Secondary | ICD-10-CM | POA: Diagnosis not present

## 2019-03-28 DIAGNOSIS — H40013 Open angle with borderline findings, low risk, bilateral: Secondary | ICD-10-CM | POA: Diagnosis not present

## 2019-03-28 DIAGNOSIS — H04123 Dry eye syndrome of bilateral lacrimal glands: Secondary | ICD-10-CM | POA: Diagnosis not present

## 2019-03-28 DIAGNOSIS — H16223 Keratoconjunctivitis sicca, not specified as Sjogren's, bilateral: Secondary | ICD-10-CM | POA: Diagnosis not present

## 2019-04-02 ENCOUNTER — Other Ambulatory Visit (HOSPITAL_COMMUNITY): Payer: Self-pay | Admitting: Psychiatry

## 2019-04-02 ENCOUNTER — Telehealth (HOSPITAL_COMMUNITY): Payer: Self-pay

## 2019-04-02 DIAGNOSIS — F5105 Insomnia due to other mental disorder: Secondary | ICD-10-CM

## 2019-04-02 NOTE — Telephone Encounter (Signed)
Patient called requesting a refill on her Eszopiclone 2mg . She is using [facility] and has a scheduled appointment for 05/22/19. Please review and advise. Thank you.

## 2019-04-03 ENCOUNTER — Other Ambulatory Visit (HOSPITAL_COMMUNITY): Payer: Self-pay | Admitting: Psychiatry

## 2019-04-03 DIAGNOSIS — F5105 Insomnia due to other mental disorder: Secondary | ICD-10-CM

## 2019-04-03 DIAGNOSIS — F99 Mental disorder, not otherwise specified: Secondary | ICD-10-CM

## 2019-04-03 MED ORDER — ESZOPICLONE 2 MG PO TABS: 2.0000 mg | ORAL_TABLET | Freq: Every evening | ORAL | 1 refills | Status: DC | PRN

## 2019-04-03 NOTE — Telephone Encounter (Signed)
done

## 2019-04-15 ENCOUNTER — Other Ambulatory Visit (HOSPITAL_COMMUNITY)
Admission: RE | Admit: 2019-04-15 | Discharge: 2019-04-15 | Disposition: A | Discharge status: Home / Self Care | Payer: Medicare HMO | Source: Ambulatory Visit | Attending: Neurology | Admitting: Neurology

## 2019-04-15 DIAGNOSIS — E1169 Type 2 diabetes mellitus with other specified complication: Secondary | ICD-10-CM | POA: Diagnosis not present

## 2019-04-15 DIAGNOSIS — M1990 Unspecified osteoarthritis, unspecified site: Secondary | ICD-10-CM | POA: Diagnosis not present

## 2019-04-15 DIAGNOSIS — Z20828 Contact with and (suspected) exposure to other viral communicable diseases: Secondary | ICD-10-CM | POA: Diagnosis not present

## 2019-04-15 DIAGNOSIS — E7800 Pure hypercholesterolemia, unspecified: Secondary | ICD-10-CM | POA: Diagnosis not present

## 2019-04-15 DIAGNOSIS — R69 Illness, unspecified: Secondary | ICD-10-CM | POA: Diagnosis not present

## 2019-04-15 DIAGNOSIS — Z01812 Encounter for preprocedural laboratory examination: Secondary | ICD-10-CM | POA: Diagnosis not present

## 2019-04-15 DIAGNOSIS — M159 Polyosteoarthritis, unspecified: Secondary | ICD-10-CM | POA: Diagnosis not present

## 2019-04-15 DIAGNOSIS — E785 Hyperlipidemia, unspecified: Secondary | ICD-10-CM | POA: Diagnosis not present

## 2019-04-15 DIAGNOSIS — I10 Essential (primary) hypertension: Secondary | ICD-10-CM | POA: Diagnosis not present

## 2019-04-15 DIAGNOSIS — E039 Hypothyroidism, unspecified: Secondary | ICD-10-CM | POA: Diagnosis not present

## 2019-04-15 DIAGNOSIS — E1165 Type 2 diabetes mellitus with hyperglycemia: Secondary | ICD-10-CM | POA: Diagnosis not present

## 2019-04-15 LAB — SARS CORONAVIRUS 2 (TAT 6-24 HRS): SARS Coronavirus 2: NEGATIVE

## 2019-04-17 ENCOUNTER — Other Ambulatory Visit: Payer: Self-pay

## 2019-04-17 ENCOUNTER — Ambulatory Visit (INDEPENDENT_AMBULATORY_CARE_PROVIDER_SITE_OTHER): Payer: Medicare HMO | Admitting: Neurology

## 2019-04-17 DIAGNOSIS — R635 Abnormal weight gain: Secondary | ICD-10-CM

## 2019-04-17 DIAGNOSIS — G4734 Idiopathic sleep related nonobstructive alveolar hypoventilation: Secondary | ICD-10-CM

## 2019-04-17 DIAGNOSIS — G4733 Obstructive sleep apnea (adult) (pediatric): Secondary | ICD-10-CM | POA: Diagnosis not present

## 2019-04-17 DIAGNOSIS — G4486 Cervicogenic headache: Secondary | ICD-10-CM

## 2019-04-17 DIAGNOSIS — G4720 Circadian rhythm sleep disorder, unspecified type: Secondary | ICD-10-CM

## 2019-04-17 DIAGNOSIS — G4489 Other headache syndrome: Secondary | ICD-10-CM

## 2019-04-18 DIAGNOSIS — Z9189 Other specified personal risk factors, not elsewhere classified: Secondary | ICD-10-CM | POA: Diagnosis not present

## 2019-04-18 DIAGNOSIS — Z8619 Personal history of other infectious and parasitic diseases: Secondary | ICD-10-CM | POA: Diagnosis not present

## 2019-04-18 DIAGNOSIS — Z6841 Body Mass Index (BMI) 40.0 and over, adult: Secondary | ICD-10-CM | POA: Diagnosis not present

## 2019-04-24 ENCOUNTER — Telehealth: Payer: Self-pay

## 2019-04-24 NOTE — Telephone Encounter (Signed)
I called pt. I advised pt that Dr. Rexene Alberts reviewed their sleep study results and found that pt did fairly well with the cpap during the night. Dr. Rexene Alberts recommends that pt start a cpap at home but also discuss the lower baseline O2 noted with her PCP. Pt may need a pulmonologist and may need supplemental O2. I reviewed PAP compliance expectations with the pt. Pt is agreeable to starting a CPAP. I advised pt that an order will be sent to a DME, [facility], and [facility] will call the pt within about one week after they file with the pt's insurance. [facility] will show the pt how to use the machine, fit for masks, and troubleshoot the CPAP if needed. A follow up appt was made for insurance purposes with Amy, NP on 07/01/19 at 2:00pm. Pt verbalized understanding to arrive 15 minutes early and bring their CPAP. A letter with all of this information in it will be mailed to the pt as a reminder. I verified with the pt that the address we have on file is correct. Pt verbalized understanding of results. Pt had no questions at this time but was encouraged to call back if questions arise. I have sent the order to [facility] and have received confirmation that they have received the order.

## 2019-04-24 NOTE — Addendum Note (Signed)
Addended by: [name] on: 04/24/2019 08:48 AM   Modules accepted: Orders

## 2019-04-24 NOTE — Telephone Encounter (Signed)
-----   Message from [name], MD sent at 04/24/2019  8:48 AM EDT ----- Patient referred by Dr. Zada Finders, seen by me on 02/13/19, PSG on 03/09/19. Patient had a CPAP titration study on 04/17/19.  Please call and inform patient that I have entered an order for treatment with positive airway pressure (PAP) treatment for obstructive sleep apnea (OSA). She did fairly during the latest sleep study with CPAP. She had lower O2 values throughout the night. I would like for her to FU with PCP regarding nocturnal desaturations, which were not resolved with CPAP. She may benefit from seeing an lung doctor and may qualify for supplemental O2. Weight loss and optimizing her luch disease are very important. I recommend we start her on CPAP of 16 cm at home. She may have a DME company from before. FU in 10-12 weeks is needed and compliance is very important for her severe OSA. Please re-iterate.  Also remind patient, that any interim PAP machine or mask issues should be first addressed with the DME company, as they can often help better with technical and mask fit issues. Please ask if patient has a preference regarding DME company.  [name], MD, [name] [facility])

## 2019-04-24 NOTE — Procedures (Signed)
PATIENT'S NAME:  [name], [name] DOB:      1966-01-19      MR#:    [id number]     DATE OF RECORDING: 04/17/2019 REFERRING M.D.:  Emelda Brothers, MD Study Performed:   CPAP  Titration HISTORY: 53 year old woman with a history of hypertension, hyperlipidemia, hypothyroidism, history of mood disorder including depression, anxiety, history of asthma, allergies, diabetes, fibromyalgia, morbid obesity, obstructive sleep apnea, on CPAP who presents for a full night titration study. She has a prior history of OSA and had a diagnostic polysomnogram on 03/09/2019, which showed a total AHI of 86.1/hour and O2 nadir of 73%. The absence of REM sleep likely underestimated her AHI and O2 nadir. The patient's weight 297 pounds with a height of 63 (inches), resulting in a BMI of 52.7 kg/m2. The patient's neck circumference measured 18.2 inches.  CURRENT MEDICATIONS: Lipitor, Bioton, Flexeril, Cymbalta, Lunesta, Lasix, Indocin, Synthroid, Prinivil, Lithobid, Glucophage, Remeron, Multivitamin, Omega-3, Ditropan-XL, Vitamin B6.  PROCEDURE:  This is a multichannel digital polysomnogram utilizing the SomnoStar 11.2 system.  Electrodes and sensors were applied and monitored per AASM Specifications.   EEG, EOG, Chin and Limb EMG, were sampled at 200 Hz.  ECG, Snore and Nasal Pressure, Thermal Airflow, Respiratory Effort, CPAP Flow and Pressure, Oximetry was sampled at 50 Hz. Digital video and audio were recorded.      The patient was fitted with a small F30i FFM, CPAP was initiated at 5 cmH20 with heated humidity per AASM s standards and pressure was advanced to 14 cmH20 because of hypopneas, apneas and desaturations.  At a PAP pressure of 14 cmH20, there was a residual AHI to 12.6/h with supine REM sleep achieved and O2 nadir of 83%.   Lights Out was at 22:01 and Lights On at 04:01. Total recording time (TRT) was 360.5 minutes, with a total sleep time (TST) of 263.5 minutes. The patient's sleep latency was 68.5 minutes,  which is delayed. REM latency was 256.5 minutes, which is markedly delayed. The sleep efficiency was 73.1 %.    SLEEP ARCHITECTURE: WASO (Wake after sleep onset) was 18.5 minutes with inability to return to sleep after 3 AM.  There were 6 minutes in Stage N1, 163.5 minutes Stage N2, 72.5 minutes Stage N3 and 21.5 minutes in Stage REM.  The percentage of Stage N1 was 2.3%, Stage N2 was 62.%, which is increased, Stage N3 was 27.5%, which is increased, and Stage R (REM sleep) was 8.2%, which is reduced. The arousals were noted as: 50 were spontaneous, 0 were associated with PLMs, 74 were associated with respiratory events.  RESPIRATORY ANALYSIS:  There was a total of 123 respiratory events: 9 obstructive apneas, 6 central apneas and 1 mixed apneas with a total of 16 apneas and an apnea index (AI) of 3.6 /hour. There were 107 hypopneas with a hypopnea index of 24.4/hour. The patient also had 0 respiratory event related arousals (RERAs).      The total APNEA/HYPOPNEA INDEX  (AHI) was 28. /hour and the total RESPIRATORY DISTURBANCE INDEX was 28. /hour  5 events occurred in REM sleep and 118 events in NREM. The REM AHI was 14. /hour versus a non-REM AHI of 29.3 /hour.  The patient spent 168 minutes of total sleep time in the supine position and 96 minutes in non-supine. The supine AHI was 16.0, versus a non-supine AHI of 49.0.  OXYGEN SATURATION & C02:  The baseline 02 saturation was 88%, with the lowest being 81%. Time spent below 89% saturation equaled  26 minutes.  PERIODIC LIMB MOVEMENTS:  The patient had a total of 0 Periodic Limb Movements. The Periodic Limb Movement (PLM) index was 0 and the PLM Arousal index was 0 /hour.  Audio and video analysis did not show any abnormal or unusual movements, behaviors, phonations or vocalizations. The patient took on bathroom break. The EKG was in keeping with normal sinus rhythm (NSR).  Post-study, the patient indicated that sleep was worse than usual.    IMPRESSION: 1. Severe Obstructive Sleep Apnea (OSA) 2. Lower baseline oxygen saturations 3. Dysfunctions associated with sleep stages or arousals from sleep    RECOMMENDATIONS: 1. This study demonstrates lower baseline oxygen saturations. She will be advised to talk to her PCP about seeing a pulmonologist. 2. The study showed improvement of her OSA with CPAP, but not full resolution with a pressure of 14 cm. She will be advised to start home CPAP at a pressure of 16 cm. She may require supplemental oxygen, if she is a candidate.   3. Please note that untreated obstructive sleep apnea may carry additional perioperative morbidity. Patients with significant obstructive sleep apnea should receive perioperative PAP therapy and the surgeons and particularly the anesthesiologist should be informed of the diagnosis and the severity of the sleep disordered breathing. 4. This study shows sleep fragmentation and abnormal sleep stage percentages; these are nonspecific findings and per se do not signify an intrinsic sleep disorder or a cause for the patient's sleep-related symptoms. Causes include (but are not limited to) the first night effect of the sleep study, circadian rhythm disturbances, medication effect or an underlying mood disorder or medical problem.  5. The patient should be cautioned not to drive, work at heights, or operate dangerous or heavy equipment when tired or sleepy. Review and reiteration of good sleep hygiene measures should be pursued with any patient. 6. The patient will be seen in follow-up in the sleep clinic at [facility] for discussion of the test results, symptom and treatment compliance review, further management strategies, etc. The referring provider will be notified of the test results.   I certify that I have reviewed the entire raw data recording prior to the issuance of this report in accordance with the Standards of Accreditation of the American Academy of Sleep Medicine  (AASM).  [name], MD, [name] Diplomat, American Board of Neurology and Sleep Medicine (Neurology and Sleep Medicine)

## 2019-04-24 NOTE — Progress Notes (Signed)
Patient referred by Dr. Zada Finders, seen by me on 02/13/19, PSG on 03/09/19. Patient had a CPAP titration study on 04/17/19.  Please call and inform patient that I have entered an order for treatment with positive airway pressure (PAP) treatment for obstructive sleep apnea (OSA). She did fairly during the latest sleep study with CPAP. She had lower O2 values throughout the night. I would like for her to FU with PCP regarding nocturnal desaturations, which were not resolved with CPAP. She may benefit from seeing an lung doctor and may qualify for supplemental O2. Weight loss and optimizing her luch disease are very important. I recommend we start her on CPAP of 16 cm at home. She may have a DME company from before. FU in 10-12 weeks is needed and compliance is very important for her severe OSA. Please re-iterate.  Also remind patient, that any interim PAP machine or mask issues should be first addressed with the DME company, as they can often help better with technical and mask fit issues. Please ask if patient has a preference regarding DME company.  [name], MD, [name] [facility])

## 2019-04-29 DIAGNOSIS — E7800 Pure hypercholesterolemia, unspecified: Secondary | ICD-10-CM | POA: Diagnosis not present

## 2019-04-29 DIAGNOSIS — E785 Hyperlipidemia, unspecified: Secondary | ICD-10-CM | POA: Diagnosis not present

## 2019-04-29 DIAGNOSIS — E1169 Type 2 diabetes mellitus with other specified complication: Secondary | ICD-10-CM | POA: Diagnosis not present

## 2019-04-29 DIAGNOSIS — I10 Essential (primary) hypertension: Secondary | ICD-10-CM | POA: Diagnosis not present

## 2019-04-29 DIAGNOSIS — M159 Polyosteoarthritis, unspecified: Secondary | ICD-10-CM | POA: Diagnosis not present

## 2019-04-29 DIAGNOSIS — R69 Illness, unspecified: Secondary | ICD-10-CM | POA: Diagnosis not present

## 2019-04-29 DIAGNOSIS — E039 Hypothyroidism, unspecified: Secondary | ICD-10-CM | POA: Diagnosis not present

## 2019-04-29 DIAGNOSIS — E1165 Type 2 diabetes mellitus with hyperglycemia: Secondary | ICD-10-CM | POA: Diagnosis not present

## 2019-04-29 DIAGNOSIS — M1990 Unspecified osteoarthritis, unspecified site: Secondary | ICD-10-CM | POA: Diagnosis not present

## 2019-05-01 DIAGNOSIS — N3942 Incontinence without sensory awareness: Secondary | ICD-10-CM | POA: Diagnosis not present

## 2019-05-01 DIAGNOSIS — N319 Neuromuscular dysfunction of bladder, unspecified: Secondary | ICD-10-CM | POA: Diagnosis not present

## 2019-05-02 ENCOUNTER — Ambulatory Visit (INDEPENDENT_AMBULATORY_CARE_PROVIDER_SITE_OTHER): Payer: Medicare HMO

## 2019-05-02 ENCOUNTER — Ambulatory Visit (INDEPENDENT_AMBULATORY_CARE_PROVIDER_SITE_OTHER): Payer: Medicare HMO | Admitting: Pulmonary Disease

## 2019-05-02 ENCOUNTER — Other Ambulatory Visit: Payer: Self-pay

## 2019-05-02 ENCOUNTER — Encounter: Payer: Self-pay | Admitting: Pulmonary Disease

## 2019-05-02 VITALS — BP 118/64 | HR 82 | Temp 97.0°F | Ht 63.0 in | Wt 295.6 lb

## 2019-05-02 DIAGNOSIS — G4730 Sleep apnea, unspecified: Secondary | ICD-10-CM | POA: Diagnosis not present

## 2019-05-02 DIAGNOSIS — G4734 Idiopathic sleep related nonobstructive alveolar hypoventilation: Secondary | ICD-10-CM

## 2019-05-02 DIAGNOSIS — R0902 Hypoxemia: Secondary | ICD-10-CM | POA: Diagnosis not present

## 2019-05-02 DIAGNOSIS — Z6841 Body Mass Index (BMI) 40.0 and over, adult: Secondary | ICD-10-CM

## 2019-05-02 NOTE — Progress Notes (Signed)
[facility]  Chief Complaint  Patient presents with  . Consult    O2 drops during sleep. Recent sleep study and CPAP Titration done.    Constitutional:  BP 118/64 (BP Location: Right Arm, Patient Position: Sitting, Cuff Size: Normal) Comment (BP Location): forearm  Pulse 82   Temp (!) 97 F (36.1 C)   Ht 5\' 3"  (1.6 m)   Wt 295 lb 9.6 oz (134.1 kg)   SpO2 97% Comment: on room air  BMI 52.36 kg/m   Past Medical History:  Headaches, HTN, HLD, Hypothyroidism, Depression, Anxiety, Asthma, DM, Fibromyalgia, PSTD, Osteoporosis, PNA  Brief Summary:  [name] is a 53 y.o. female with obstructive sleep apnea.  She was seen recently by Dr. Rexene [name] with [facility] for assessment of headaches.  She has hx of sleep apnea.  Had sleep study recently.  Found to have good control of apnea with CPAP.  She was started on CPAP 16 cm H2O.  She was noted to have low oxygen and was advised she need pulmonologist.  BMET from 03/20/19 showed CO2 of 31.  She is due to get new CPAP machine from [employer], and is hopefully she will get this next week.  She gets winded with walking.  Can't walk up stairs due to knee problems, but then also getting winded.  No history of asthma.  Denies cough, wheeze, chest congestion, chest pain, palpitations.  No history of pneumonia, TB, heart disease, or thromboembolic disease.  Gets mild ankle swelling.  Hasn't had recent CXR.  Doesn't recall having PFT before.  She had surgery few years ago and had to use Bipap then.     Physical Exam:   Appearance - well kempt   ENMT - clear nasal mucosa, midline nasal  septum, no oral exudates, no LAN, trachea midline  Respiratory - normal chest wall, normal respiratory effort, no accessory muscle use, no wheeze/rales  CV - s1s2 regular rate and rhythm, no murmurs, no peripheral edema, radial pulses symmetric  GI - soft, non tender, no masses  Lymph - no adenopathy noted in neck  and axillary areas  MSK - normal gait  Ext - no cyanosis, clubbing, or joint inflammation noted  Skin - no rashes, lesions, or ulcers  Neuro - normal strength, oriented x 3  Psych - normal mood and affect  Discussion:  She has severe obstructive sleep apnea.  She is morbidly obese.  She has elevated CO2 on recent BMET.  Noted to have persistent nocturnal hypoxemia during CPAP titration study even though sleep apnea seemed reasonably controlled.  She could have obesity hypoventilation syndrome.  Assessment/Plan:   Nocturnal hypoxemia with severe obstructive sleep apnea and morbid obesity. - she will get new CPAP machine next week through [place] at pressure of 16 cm H2O - will arrange for overnight oximetry on CPAP 16 cm with new machine and get download from new machine after she has used for at least 2 weeks - will check chest xray today - discussed importance of weight loss and reviewed options to assist with weight loss - had detailed discussion about how sleep apnea can impact her health - driving precautions reviewed - if she is found to have persistent nocturnal hypoxia while wearing CPAP, then she might need repeat titration study to determine if she needs to change to Bipap and/or add supplemental oxygen at night; would need to coordinate any additional testing with Dr. Rexene [name] with [facility]  Patient Instructions  Chest xray today  Will arrange for overnight oxygen test with your new CPAP machine  Will get copy of your CPAP download after you have used the new CPAP machine for at least 2 weeks  Will call to schedule follow up after above tests reviewed    Jessica Mires, MD [place] Pager: [phone] 05/02/2019, 4:50 PM  Flow Sheet     Pulmonary tests:    Sleep tests:  PSG 03/09/19 >> AHI 86.1, SpO2 low 73%  Cardiac tests:    Review of Systems:  Loss of appetite, Weight change, Trouble swallowing, Headaches,  Anxiety, Depression, Ankle swelling.  Remainder reviewed and negative  Medications:   Allergies as of 05/02/2019      Reactions   Prednisolone Hives, Other (See Comments)   Can tolerate methylprednisone- pt is unsure of this allergy 11-14-16   Prednisone Hives   Vicodin [hydrocodone-acetaminophen] Other (See Comments)   Felt very hot      Medication List       Accurate as of May 02, 2019  4:50 PM. If you have any questions, ask your nurse or doctor.        albuterol 108 (90 Base) MCG/ACT inhaler Commonly known as: VENTOLIN HFA Inhale 2 puffs into the lungs every 6 (six) hours as needed for wheezing or shortness of breath.   atorvastatin 10 MG tablet Commonly known as: LIPITOR Take 10 mg by mouth at bedtime.   Basaglar KwikPen 100 UNIT/ML Sopn Inject 22 Units into the skin daily.   Biotin 1 MG Caps Take 1 mg by mouth daily.   cyclobenzaprine 10 MG tablet Commonly known as: FLEXERIL Take 10 mg by mouth every 8 (eight) hours as needed for muscle spasms.   DULoxetine 60 MG capsule Commonly known as: Cymbalta Take 1 capsule (60 mg total) by mouth 2 (two) times daily.   eszopiclone 2 MG Tabs tablet Commonly known as: LUNESTA Take 1 tablet (2 mg total) by mouth at bedtime as needed for sleep. Take immediately before bedtime   Fish Oil 1000 MG Caps Take 1,000 mg by mouth at bedtime.   furosemide 20 MG tablet Commonly known as: LASIX Take 60 mg by mouth daily.   Glucosamine Chondr 1500 Complx Caps Take 1 capsule by mouth 1 day or 1 dose.   indomethacin 25 MG capsule Commonly known as: INDOCIN Take 50 mg by mouth every 12 (twelve) hours.   levothyroxine 150 MCG tablet Commonly known as: SYNTHROID Take 150 mcg by mouth daily before breakfast.   lisinopril 20 MG tablet Commonly known as: ZESTRIL Take 20 mg by mouth daily.   lithium carbonate 300 MG CR tablet Commonly known as: LITHOBID Take 2 tablets (600 mg total) by mouth daily.   LORazepam 1 MG tablet  Commonly known as: Ativan Take 1 tablet (1 mg total) by mouth 2 (two) times daily.   metFORMIN 1000 MG tablet Commonly known as: GLUCOPHAGE Take 1,000 mg by mouth 2 (two) times daily with a meal.   mirtazapine 30 MG tablet Commonly known as: REMERON Take 2 tablets (60 mg total) by mouth at bedtime.   multivitamin tablet Take 1 tablet by mouth at bedtime.   omeprazole 20 MG capsule Commonly known as: PRILOSEC Take 20 mg by mouth daily.   oxybutynin 10 MG 24 hr tablet Commonly known as: DITROPAN-XL Take 10 mg by mouth 2 (two) times daily.   pyridOXINE 100 MG tablet Commonly known as: VITAMIN B-6 Take 100 mg by mouth  at bedtime.   vitamin C 1000 MG tablet Take 1,000 mg by mouth at bedtime.       Past Surgical History:  She  has a past surgical history that includes Wisdom tooth extraction; Carpal tunnel release; Cholecystectomy; Total knee arthroplasty (Right, 09/17/2012); Knee Closed Reduction (Right, 01/10/2013); Tonsillectomy; Total knee arthroplasty (Left, 03/04/2013); Shoulder open rotator cuff repair (Right, 07/16/2014); Steriod injection; Lumbar laminectomy/decompression microdiscectomy (N/A, 01/26/2016); Cataract extraction, bilateral; Colonoscopy w/ biopsies and polypectomy; Anterior cervical decomp/discectomy fusion (N/A, 03/26/2018); Ulnar nerve transposition (Left, 06/05/2018); and Steriod injection (Bilateral, 06/05/2018).  Family History:  Her family history includes Depression in her mother; Schizophrenia in her brother; Stroke in an other family member; Suicidality in an other family member. She was adopted.  Social History:  She  reports that she has never smoked. She has never used smokeless tobacco. She reports that she does not drink alcohol or use drugs.

## 2019-05-02 NOTE — Patient Instructions (Signed)
Chest xray today  Will arrange for overnight oxygen test with your new CPAP machine  Will get copy of your CPAP download after you have used the new CPAP machine for at least 2 weeks  Will call to schedule follow up after above tests reviewed

## 2019-05-06 ENCOUNTER — Telehealth: Payer: Self-pay | Admitting: Pulmonary Disease

## 2019-05-06 NOTE — Telephone Encounter (Signed)
Dg Chest 2 View  Result Date: 05/02/2019 CLINICAL DATA:  Hypoxia EXAM: CHEST - 2 VIEW COMPARISON:  12/25/2016 FINDINGS: Lungs are clear.  No pleural effusion or pneumothorax. The heart is normal in size. Mild degenerative changes of the visualized thoracolumbar spine. Cervical spine fixation hardware. Cholecystectomy clips. IMPRESSION: Normal chest radiographs. Electronically Signed   By: Julian Hy M.D.   On: 05/02/2019 22:25    Please let her know that her CXR was normal.

## 2019-05-06 NOTE — Telephone Encounter (Signed)
Called the patient and advised her of the results. Patient said she did not get the machine yet. I told her the company will call her to set up, but they also have other offices outside of ours requesting CPAP's for their patients.  I went back to the office note and it stated she was to have the overnight oximetry with the new machine and we get a download after she has used the machine for two weeks.   I advised the patient to call the DME back if they tried to reach out to her and left a message. The patient agreed and said she was going to call them now.  Nothing further needed at this time.

## 2019-05-08 ENCOUNTER — Emergency Department (HOSPITAL_COMMUNITY): Payer: Medicare HMO

## 2019-05-08 ENCOUNTER — Other Ambulatory Visit: Payer: Self-pay

## 2019-05-08 ENCOUNTER — Emergency Department (HOSPITAL_COMMUNITY)
Admission: EM | Admit: 2019-05-08 | Discharge: 2019-05-08 | Disposition: A | Discharge status: Home / Self Care | Payer: Medicare HMO | Attending: Emergency Medicine | Admitting: Emergency Medicine

## 2019-05-08 ENCOUNTER — Encounter (HOSPITAL_COMMUNITY): Payer: Self-pay | Admitting: Emergency Medicine

## 2019-05-08 DIAGNOSIS — J45909 Unspecified asthma, uncomplicated: Secondary | ICD-10-CM | POA: Diagnosis not present

## 2019-05-08 DIAGNOSIS — R0789 Other chest pain: Secondary | ICD-10-CM | POA: Diagnosis not present

## 2019-05-08 DIAGNOSIS — Z96652 Presence of left artificial knee joint: Secondary | ICD-10-CM | POA: Insufficient documentation

## 2019-05-08 DIAGNOSIS — R0602 Shortness of breath: Secondary | ICD-10-CM | POA: Diagnosis not present

## 2019-05-08 DIAGNOSIS — Z96651 Presence of right artificial knee joint: Secondary | ICD-10-CM | POA: Diagnosis not present

## 2019-05-08 DIAGNOSIS — Z7984 Long term (current) use of oral hypoglycemic drugs: Secondary | ICD-10-CM | POA: Diagnosis not present

## 2019-05-08 DIAGNOSIS — E039 Hypothyroidism, unspecified: Secondary | ICD-10-CM | POA: Insufficient documentation

## 2019-05-08 DIAGNOSIS — Z79899 Other long term (current) drug therapy: Secondary | ICD-10-CM | POA: Diagnosis not present

## 2019-05-08 DIAGNOSIS — I10 Essential (primary) hypertension: Secondary | ICD-10-CM | POA: Diagnosis not present

## 2019-05-08 DIAGNOSIS — R079 Chest pain, unspecified: Secondary | ICD-10-CM | POA: Diagnosis not present

## 2019-05-08 DIAGNOSIS — E1165 Type 2 diabetes mellitus with hyperglycemia: Secondary | ICD-10-CM | POA: Diagnosis not present

## 2019-05-08 DIAGNOSIS — R000 Tachycardia, unspecified: Secondary | ICD-10-CM | POA: Diagnosis not present

## 2019-05-08 LAB — CBC
HCT: 44.6 % (ref 36.0–46.0)
Hemoglobin: 14.1 g/dL (ref 12.0–15.0)
MCH: 30.9 pg (ref 26.0–34.0)
MCHC: 31.6 g/dL (ref 30.0–36.0)
MCV: 97.6 fL (ref 80.0–100.0)
Platelets: 301 10*3/uL (ref 150–400)
RBC: 4.57 MIL/uL (ref 3.87–5.11)
RDW: 14.6 % (ref 11.5–15.5)
WBC: 13.4 10*3/uL — ABNORMAL HIGH (ref 4.0–10.5)
nRBC: 0 % (ref 0.0–0.2)

## 2019-05-08 LAB — BASIC METABOLIC PANEL
Anion gap: 16 — ABNORMAL HIGH (ref 5–15)
BUN: 20 mg/dL (ref 6–20)
CO2: 28 mmol/L (ref 22–32)
Calcium: 9.9 mg/dL (ref 8.9–10.3)
Chloride: 100 mmol/L (ref 98–111)
Creatinine, Ser: 1.22 mg/dL — ABNORMAL HIGH (ref 0.44–1.00)
GFR calc Af Amer: 59 mL/min — ABNORMAL LOW (ref >60)
GFR calc non Af Amer: 51 mL/min — ABNORMAL LOW (ref >60)
Glucose, Bld: 163 mg/dL — ABNORMAL HIGH (ref 70–99)
Potassium: 4.4 mmol/L (ref 3.5–5.1)
Sodium: 144 mmol/L (ref 135–145)

## 2019-05-08 LAB — TROPONIN I (HIGH SENSITIVITY)
Troponin I (High Sensitivity): <2 ng/L (ref <18)
Troponin I (High Sensitivity): <2 ng/L (ref <18)

## 2019-05-08 LAB — I-STAT BETA HCG BLOOD, ED (MC, WL, AP ONLY): I-stat hCG, quantitative: <5 m[IU]/mL (ref <5)

## 2019-05-08 LAB — D-DIMER, QUANTITATIVE: D-Dimer, Quant: 1.06 ug/mL-FEU — ABNORMAL HIGH (ref 0.00–0.50)

## 2019-05-08 MED ORDER — IOHEXOL 350 MG/ML SOLN
100.0000 mL | Freq: Once | INTRAVENOUS | Status: AC | PRN
  Administered 2019-05-08: 100 mL via INTRAVENOUS

## 2019-05-08 MED ORDER — SODIUM CHLORIDE 0.9% FLUSH: 3.0000 mL | Freq: Once | INTRAVENOUS | Status: DC

## 2019-05-08 MED ORDER — KETOROLAC TROMETHAMINE 15 MG/ML IJ SOLN
15.0000 mg | Freq: Once | INTRAMUSCULAR | Status: AC
  Administered 2019-05-08: 15 mg via INTRAVENOUS
  Filled 2019-05-08: qty 1

## 2019-05-08 MED ORDER — LIDOCAINE VISCOUS HCL 2 % MT SOLN
15.0000 mL | Freq: Once | OROMUCOSAL | Status: AC
  Administered 2019-05-08: 15 mL via ORAL
  Filled 2019-05-08: qty 15

## 2019-05-08 MED ORDER — SODIUM CHLORIDE 0.9 % IV BOLUS
1000.0000 mL | Freq: Once | INTRAVENOUS | Status: AC
  Administered 2019-05-08: 1000 mL via INTRAVENOUS

## 2019-05-08 MED ORDER — ALUM & MAG HYDROXIDE-SIMETH 200-200-20 MG/5ML PO SUSP
30.0000 mL | Freq: Once | ORAL | Status: AC
  Administered 2019-05-08: 21:00:00 30 mL via ORAL
  Filled 2019-05-08: qty 30
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
## 2019-05-08 NOTE — ED Notes (Signed)
Pt ambulated self efficiently to restroom with no difficulty. Pt returned safely to bedside. 

## 2019-05-08 NOTE — Discharge Instructions (Signed)
Your caregiver has diagnosed you as having chest pain that is not specific for one problem, but does not require admission.  We felt that your chest pain may be muscular, particularly because you responded so well to anti-inflammatory medicine in the ER.  However, I did advise that you see a cardiologist in the nearby future, given your cardiac "risk factors."  You may need an echo or stress test.  You should call to set up an appointment with the [facility] group next week.  Their office may try to reach out to you tomorrow or Monday to set this up.  SEEK IMMEDIATE MEDICAL ATTENTION IF: You have severe chest pain, especially if the pain is crushing or pressure-like and spreads to the arms, back, neck, or jaw, or if you have sweating, nausea (feeling sick to your stomach), or shortness of breath. THIS IS AN EMERGENCY. Don't wait to see if the pain will go away. Get medical help at once. Call 911 or 0 (operator). DO NOT drive yourself to the hospital.   Your chest pain gets worse and does not go away with rest.  You have an attack of chest pain lasting longer than usual, despite rest and treatment with the medications your caregiver has prescribed.  You wake from sleep with chest pain or shortness of breath.  You feel dizzy or faint.  You have chest pain not typical of your usual pain for which you originally saw your caregiver.

## 2019-05-12 ENCOUNTER — Encounter: Payer: Self-pay | Admitting: Pulmonary Disease

## 2019-05-12 DIAGNOSIS — R0902 Hypoxemia: Secondary | ICD-10-CM | POA: Diagnosis not present

## 2019-05-12 DIAGNOSIS — J449 Chronic obstructive pulmonary disease, unspecified: Secondary | ICD-10-CM | POA: Diagnosis not present

## 2019-05-12 DIAGNOSIS — G4733 Obstructive sleep apnea (adult) (pediatric): Secondary | ICD-10-CM | POA: Diagnosis not present

## 2019-05-13 ENCOUNTER — Encounter: Payer: Self-pay | Admitting: Cardiovascular Disease

## 2019-05-13 ENCOUNTER — Ambulatory Visit (INDEPENDENT_AMBULATORY_CARE_PROVIDER_SITE_OTHER): Payer: Medicare HMO | Admitting: Cardiovascular Disease

## 2019-05-13 ENCOUNTER — Telehealth: Payer: Self-pay | Admitting: Pulmonary Disease

## 2019-05-13 VITALS — BP 123/80 | HR 94 | Ht 63.0 in | Wt 299.2 lb

## 2019-05-13 DIAGNOSIS — R0989 Other specified symptoms and signs involving the circulatory and respiratory systems: Secondary | ICD-10-CM | POA: Diagnosis not present

## 2019-05-13 DIAGNOSIS — E785 Hyperlipidemia, unspecified: Secondary | ICD-10-CM

## 2019-05-13 DIAGNOSIS — R0789 Other chest pain: Secondary | ICD-10-CM | POA: Diagnosis not present

## 2019-05-13 DIAGNOSIS — I10 Essential (primary) hypertension: Secondary | ICD-10-CM | POA: Insufficient documentation

## 2019-05-13 DIAGNOSIS — R079 Chest pain, unspecified: Secondary | ICD-10-CM | POA: Diagnosis not present

## 2019-05-13 DIAGNOSIS — G4730 Sleep apnea, unspecified: Secondary | ICD-10-CM

## 2019-05-13 MED ORDER — METOPROLOL TARTRATE 100 MG PO TABS: 100.0000 mg | ORAL_TABLET | Freq: Two times a day (BID) | ORAL | 3 refills | Status: DC

## 2019-05-13 MED ORDER — METOPROLOL TARTRATE 100 MG PO TABS: 100.0000 mg | ORAL_TABLET | Freq: Once | ORAL | 0 refills | Status: DC

## 2019-05-13 NOTE — Assessment & Plan Note (Signed)
History of essential hypertension with blood pressure measured today at 123/80.  She is on lisinopril.

## 2019-05-13 NOTE — Assessment & Plan Note (Signed)
History of dyslipidemia on statin statin therapy with lipid profile performed 03/20/2019 revealing total cholesterol 147, LDL 72 and HDL of 44.

## 2019-05-13 NOTE — Assessment & Plan Note (Signed)
Recent ER eval on 05/08/2019 for atypical chest pain.  The pain was constant.  It was worse with palpation.  Her work-up was unrevealing.  Chest CTA showed no pulmonary embolism with no coronary calcification although she did have some aortic atherosclerosis.  Since her event she is felt dyspneic with continued chest pressure that is somewhat positional.  I am to get a coronary CTA and 2D echo to further evaluate.

## 2019-05-13 NOTE — Progress Notes (Signed)
05/13/2019 [name]   05/28/1966  [id number]  Primary Physician Antony Contras, MD Primary Cardiologist: Lorretta Harp MD FACP, [place], [place], [place]  HPI:  [name] is a 53 y.o. morbidly overweight married Caucasian female with no children referred to me by the ER because of recent evaluation for atypical chest pain 05/08/2019.  Her primary care provider is Dr. Antony Contras.  She is disabled because of multiple arthritic and orthopedic issues.  Risk factors include treated hypertension, diabetes and hyperlipidemia.  She is adopted and does not know her family history.  She is never had a heart attack or stroke.  She does have PCOS.  She has obstructive sleep apnea on CPAP.  She did have a normal 2D echo and Myoview stress test performed in 2012.  On 05/08/2019 she developed chest pain while getting out of a car.  She was evaluated at [facility] emergency room.  A chest CT was negative for pulmonary embolism.  Not show coronary atherosclerosis but did show aortic atherosclerosis.  Her enzymes were negative.  She was discharged home with a diagnosis of "chest wall pain.  She is on indomethacin.  She continues to have dyspnea on exertion and some atypical chest pressure with fatigue.   Current Meds  Medication Sig  . albuterol (PROVENTIL HFA;VENTOLIN HFA) 108 (90 BASE) MCG/ACT inhaler Inhale 2 puffs into the lungs every 6 (six) hours as needed for wheezing or shortness of breath.   . Ascorbic Acid (VITAMIN C) 1000 MG tablet Take 1,000 mg by mouth at bedtime.   Marland Kitchen atorvastatin (LIPITOR) 10 MG tablet Take 10 mg by mouth at bedtime.   . Biotin 1 MG CAPS Take 1 mg by mouth daily.   . cyclobenzaprine (FLEXERIL) 10 MG tablet Take 10 mg by mouth every 8 (eight) hours as needed for muscle spasms.  . DULoxetine (CYMBALTA) 60 MG capsule Take 1 capsule (60 mg total) by mouth 2 (two) times daily.  . eszopiclone (LUNESTA) 2 MG TABS tablet Take 1 tablet (2 mg total) by mouth at bedtime  as needed for sleep. Take immediately before bedtime  . furosemide (LASIX) 20 MG tablet Take 60 mg by mouth daily.   . Glucosamine-Chondroit-Vit C-Mn (GLUCOSAMINE CHONDR 1500 COMPLX) CAPS Take 1 capsule by mouth daily.   . indomethacin (INDOCIN) 25 MG capsule Take 50 mg by mouth every 12 (twelve) hours.  . Insulin Glargine (BASAGLAR KWIKPEN) 100 UNIT/ML SOPN Inject 39 Units into the skin daily.   Marland Kitchen levothyroxine (SYNTHROID, LEVOTHROID) 150 MCG tablet Take 150 mcg by mouth daily before breakfast.   . lisinopril (PRINIVIL,ZESTRIL) 20 MG tablet Take 20 mg by mouth daily.   Marland Kitchen lithium carbonate (LITHOBID) 300 MG CR tablet Take 2 tablets (600 mg total) by mouth daily. (Patient taking differently: Take 600 mg by mouth at bedtime. )  . LORazepam (ATIVAN) 1 MG tablet Take 1 tablet (1 mg total) by mouth 2 (two) times daily.  . metFORMIN (GLUCOPHAGE) 1000 MG tablet Take 1,000 mg by mouth 2 (two) times daily with a meal.  . mirtazapine (REMERON) 30 MG tablet Take 2 tablets (60 mg total) by mouth at bedtime.  . Multiple Vitamin (MULTIVITAMIN) tablet Take 1 tablet by mouth at bedtime.   . Omega-3 Fatty Acids (FISH OIL) 1000 MG CAPS Take 1,000 mg by mouth at bedtime.   Marland Kitchen omeprazole (PRILOSEC) 40 MG capsule Take 40 mg by mouth at bedtime.  Marland Kitchen oxybutynin (DITROPAN-XL) 10 MG 24 hr tablet  Take 10 mg by mouth 2 (two) times daily.   Marland Kitchen pyridOXINE (VITAMIN B-6) 100 MG tablet Take 100 mg by mouth at bedtime.      Allergies  Allergen Reactions  . Prednisolone Hives and Other (See Comments)    Can tolerate methylprednisone- pt is unsure of this allergy 11-14-16  . Prednisone Hives  . Vicodin [Hydrocodone-Acetaminophen] Other (See Comments)    Felt very hot    Social History   Socioeconomic History  . Marital status: Married    Spouse name: Not on file  . Number of children: 0  . Years of education: Not on file  . Highest education level: Not on file  Occupational History  . 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 Financial resource strain: Not on file  . Food insecurity    Worry: Not on file    Inability: Not on file  . Transportation needs    Medical: Not on file    Non-medical: Not on file  Tobacco Use  . Smoking status: Never Smoker  . Smokeless tobacco: Never Used  Substance and Sexual Activity  . Alcohol use: No    Comment: nothing to drink in 3 years , never heavy EtOH  . Drug use: No  . Sexual activity: Yes    Partners: Male  Lifestyle  . Physical activity    Days per week: Not on file    Minutes per session: Not on file  . Stress: Not on file  Relationships  . Social Herbalist on phone: Not on file    Gets together: Not on file    Attends religious service: Not on file    Active member of club or organization: Not on file    Attends meetings of clubs or organizations: Not on file    Relationship status: Not on file  . Intimate partner violence    Fear of current or ex partner: Not on file    Emotionally abused: Not on file    Physically abused: Not on file    Forced sexual activity: Not on file  Other Topics Concern  . Not on file  Social History Narrative   Born in [place] but grew up in [place]. Her birth mother gave pt to a couple at 9 days old who raised pt to adulthood. States it was a good childhood and she was only child. Pt has 8 half siblings and she has no contact with them. Married for 38yrs and does not have children. Pt has her GED. Pt is on disability for pain or mental illness. 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      Review of Systems: General: negative for chills, fever, night sweats or weight changes.  Cardiovascular: negative for chest pain, dyspnea on exertion, edema, orthopnea, palpitations, paroxysmal nocturnal dyspnea or shortness of breath Dermatological: negative for rash Respiratory: negative for cough or wheezing Urologic: negative for  hematuria Abdominal: negative for nausea, vomiting, diarrhea, bright red blood per rectum, melena, or hematemesis Neurologic: negative for visual changes, syncope, or dizziness All other systems reviewed and are otherwise negative except as noted above.    Blood pressure 123/80, pulse 94, height 5\' 3"  (1.6 m), weight 299 lb 3.2 oz (135.7 kg), SpO2 98 %.  General appearance: alert and no distress Neck: no adenopathy, no JVD, supple, symmetrical, trachea midline, thyroid not enlarged, symmetric, no  tenderness/mass/nodules and Bilateral carotid bruits Lungs: clear to auscultation bilaterally Heart: regular rate and rhythm, S1, S2 normal, no murmur, click, rub or gallop Extremities: extremities normal, atraumatic, no cyanosis or edema Pulses: 2+ and symmetric Skin: Skin color, texture, turgor normal. No rashes or lesions Neurologic: Alert and oriented X 3, normal strength and tone. Normal symmetric reflexes. Normal coordination and gait  EKG not performed today  ASSESSMENT AND PLAN:   Dyslipidemia History of dyslipidemia on statin statin therapy with lipid profile performed 03/20/2019 revealing total cholesterol 147, LDL 72 and HDL of 44.  Chest pain Recent ER eval on 05/08/2019 for atypical chest pain.  The pain was constant.  It was worse with palpation.  Her work-up was unrevealing.  Chest CTA showed no pulmonary embolism with no coronary calcification although she did have some aortic atherosclerosis.  Since her event she is felt dyspneic with continued chest pressure that is somewhat positional.  I am to get a coronary CTA and 2D echo to further evaluate.  Essential hypertension History of essential hypertension with blood pressure measured today at 123/80.  She is on lisinopril.      Lorretta Harp MD FACP,FACC,FAHA, [facility] 05/13/2019 1:35 PM

## 2019-05-13 NOTE — Telephone Encounter (Signed)
Spoke with the pt  She is asking for ONO results just performed last night  Dr Halford Chessman have you seen these yet? Please advise thanks

## 2019-05-13 NOTE — Patient Instructions (Addendum)
Your cardiac CT will be scheduled at one of the below locations:   [facility] [address] [place], [place] [address] (336) [place] [address] [place], [place] [address] [phone]  If scheduled at [facility], please arrive at the [facility] main entrance of [facility] 30-45 minutes prior to test start time. Proceed to the [facility] Radiology Department (first floor) to check-in and test prep.  If scheduled at [facility], please arrive 15 mins early for check-in and test prep.  Please follow these instructions carefully (unless otherwise directed):  Lab work: Your physician recommends that you return for lab work 3-7 [place] (BASIC METABOLIC PANEL)   If you have labs (blood work) drawn today and your tests are completely normal, you will receive your results only by: Marland Kitchen MyChart Message (if you have MyChart) OR . A paper copy in the mail If you have any lab test that is abnormal or we need to change your treatment, we will call you to review the results.   On the Night Before the Test: . Be sure to Drink plenty of water. . Do not consume any caffeinated/decaffeinated beverages or chocolate 12 hours prior to your test. . Do not take any antihistamines 12 hours prior to your test.  On the Day of the Test: . Drink plenty of water. Do not drink any water within one hour of the test. . Do not eat any food 4 hours prior to the test. . You may take your regular medications prior to the test.  . Take metoprolol (Lopressor) 100 MG two hours prior to test. . HOLD Furosemide on the morning of the test. . FEMALES- please wear underwire-free bra if available      After the Test: . Drink plenty of water. . After receiving IV contrast, you may experience a mild flushed feeling. This is normal. . On occasion, you may experience a mild  rash up to 24 hours after the test. This is not dangerous. If this occurs, you can take Benadryl 25 mg and increase your fluid intake. . If you experience trouble breathing, this can be serious. If it is severe call 911 IMMEDIATELY. If it is mild, please call our office. . If you take any of these medications: Glipizide/Metformin, Avandament, Glucavance, please do not take 48 hours after completing test unless otherwise instructed.   Once we have confirmed authorization from your insurance company, we will call you to set up a date and time for your CARDIAC CT.   For non-scheduling related questions, please contact the cardiac imaging nurse navigator should you have any questions/concerns: Marchia Bond, RN Navigator Cardiac Imaging [facility] [phone] Office    __________________________________________________________________  Testing/Procedures: Your physician has requested that you have a carotid duplex. This test is an ultrasound of the carotid arteries in your neck. It looks at blood flow through these arteries that supply the brain with blood. Allow one hour for this exam. There are no restrictions or special instructions. TO BE SCHEDULED AT CHECK OUT  Your physician has requested that you have an echocardiogram. Echocardiography is a painless test that uses sound waves to create images of your heart. It provides your doctor with information about the size and shape of your heart and how well your heart's chambers and valves are working. This procedure takes approximately one hour. There are  no restrictions for this procedure. LOCATION: [place] [facility]: [place],  [address] TO BE SCHEDULED AT CHECK OUT   Follow-Up: At [facility], you and your health needs are our priority.  As part of our continuing mission to provide you with exceptional heart care, we have created designated Provider  Care Teams.  These Care Teams include your primary Cardiologist (physician) and Advanced Practice Providers (APPs -  Physician Assistants and Nurse Practitioners) who all work together to provide you with the care you need, when you need it. You will need a follow up appointment in 4 weeks with Dr. Quay Burow.  TO BE SCHEDULED

## 2019-05-14 ENCOUNTER — Ambulatory Visit (HOSPITAL_COMMUNITY)
Admission: RE | Admit: 2019-05-14 | Discharge: 2019-05-14 | Disposition: A | Discharge status: Home / Self Care | Payer: Medicare HMO | Source: Ambulatory Visit | Attending: Cardiology | Admitting: Cardiology

## 2019-05-14 ENCOUNTER — Other Ambulatory Visit: Payer: Self-pay

## 2019-05-14 DIAGNOSIS — R0989 Other specified symptoms and signs involving the circulatory and respiratory systems: Secondary | ICD-10-CM

## 2019-05-14 NOTE — Telephone Encounter (Signed)
Printed report received. Based on information below, Dr. Halford Chessman already received result information. Reminder made to pull CPAP report in a month.

## 2019-05-14 NOTE — Telephone Encounter (Signed)
LMOM for [name] at [facility] to fax these over  Will await fax

## 2019-05-14 NOTE — Telephone Encounter (Signed)
I have not received ONO results.

## 2019-05-14 NOTE — Telephone Encounter (Signed)
ONO with CPAP 05/12/19 >> test time 9 hrs 4 min.  Baseline SpO2 96%, low SpO2 76%.  Spent 4 min 24 sec with SpO2 < 88%.   Please let her know her oxygen test looked good with CPAP 16 cm H2O.   Please have a copy of her CPAP download sent for my review.

## 2019-05-14 NOTE — Telephone Encounter (Signed)
Spoke with the pt and notified of recs per VS  She verbalized understanding  I have sent a DME request to have her added to airview and will hold in triage until this is done so DL can be printed and given to VS

## 2019-05-16 ENCOUNTER — Ambulatory Visit (HOSPITAL_COMMUNITY): Payer: Medicare HMO | Attending: Cardiology

## 2019-05-16 ENCOUNTER — Other Ambulatory Visit: Payer: Self-pay

## 2019-05-16 DIAGNOSIS — R079 Chest pain, unspecified: Secondary | ICD-10-CM | POA: Insufficient documentation

## 2019-05-16 DIAGNOSIS — R0789 Other chest pain: Secondary | ICD-10-CM | POA: Insufficient documentation

## 2019-05-17 DIAGNOSIS — R69 Illness, unspecified: Secondary | ICD-10-CM | POA: Diagnosis not present

## 2019-05-17 DIAGNOSIS — M1990 Unspecified osteoarthritis, unspecified site: Secondary | ICD-10-CM | POA: Diagnosis not present

## 2019-05-17 DIAGNOSIS — E785 Hyperlipidemia, unspecified: Secondary | ICD-10-CM | POA: Diagnosis not present

## 2019-05-17 DIAGNOSIS — M159 Polyosteoarthritis, unspecified: Secondary | ICD-10-CM | POA: Diagnosis not present

## 2019-05-17 DIAGNOSIS — E1169 Type 2 diabetes mellitus with other specified complication: Secondary | ICD-10-CM | POA: Diagnosis not present

## 2019-05-17 DIAGNOSIS — E1165 Type 2 diabetes mellitus with hyperglycemia: Secondary | ICD-10-CM | POA: Diagnosis not present

## 2019-05-17 DIAGNOSIS — I10 Essential (primary) hypertension: Secondary | ICD-10-CM | POA: Diagnosis not present

## 2019-05-17 DIAGNOSIS — E039 Hypothyroidism, unspecified: Secondary | ICD-10-CM | POA: Diagnosis not present

## 2019-05-17 DIAGNOSIS — E7800 Pure hypercholesterolemia, unspecified: Secondary | ICD-10-CM | POA: Diagnosis not present

## 2019-05-21 ENCOUNTER — Ambulatory Visit: Payer: Medicare HMO | Admitting: Cardiology

## 2019-05-22 ENCOUNTER — Ambulatory Visit (INDEPENDENT_AMBULATORY_CARE_PROVIDER_SITE_OTHER): Payer: Medicare HMO | Admitting: Psychiatry

## 2019-05-22 ENCOUNTER — Other Ambulatory Visit: Payer: Self-pay

## 2019-05-22 ENCOUNTER — Encounter (HOSPITAL_COMMUNITY): Payer: Self-pay | Admitting: Psychiatry

## 2019-05-22 DIAGNOSIS — F411 Generalized anxiety disorder: Secondary | ICD-10-CM | POA: Diagnosis not present

## 2019-05-22 DIAGNOSIS — F5105 Insomnia due to other mental disorder: Secondary | ICD-10-CM | POA: Diagnosis not present

## 2019-05-22 DIAGNOSIS — F99 Mental disorder, not otherwise specified: Secondary | ICD-10-CM

## 2019-05-22 DIAGNOSIS — R69 Illness, unspecified: Secondary | ICD-10-CM | POA: Diagnosis not present

## 2019-05-22 DIAGNOSIS — F332 Major depressive disorder, recurrent severe without psychotic features: Secondary | ICD-10-CM | POA: Diagnosis not present

## 2019-05-22 MED ORDER — DULOXETINE HCL 60 MG PO CPEP: 60.0000 mg | ORAL_CAPSULE | Freq: Two times a day (BID) | ORAL | 2 refills | Status: DC

## 2019-05-22 MED ORDER — MIRTAZAPINE 30 MG PO TABS: 60.0000 mg | ORAL_TABLET | Freq: Every day | ORAL | 2 refills | Status: DC

## 2019-05-22 MED ORDER — ESZOPICLONE 2 MG PO TABS: 2.0000 mg | ORAL_TABLET | Freq: Every evening | ORAL | 3 refills | Status: DC | PRN

## 2019-05-22 MED ORDER — LITHIUM CARBONATE ER 300 MG PO TBCR: 600.0000 mg | EXTENDED_RELEASE_TABLET | Freq: Every day | ORAL | 2 refills | Status: DC

## 2019-05-22 NOTE — Progress Notes (Signed)
  Virtual Visit via Telephone Note  I connected with [name]  on 05/22/19 at 11:00 AM EST by telephone and verified that I am speaking with the correct person using two identifiers.  Location: Patient: home Provider: office   I discussed the limitations, risks, security and privacy concerns of performing an evaluation and management service by telephone and the availability of in person appointments. I also discussed with the patient that there may be a patient responsible charge related to this service. The patient expressed understanding and agreed to proceed.   History of Present Illness: "Really good. I am not having any anger issues. I have started using my CPAP machine again. I have been told that that was probably why I was angry. I have been doing real good. I don't feel like I have been depressed". She denies anxiety. She has random tremors. Recently she had some chest pain and has had a series of test and is working with a cardiologist.  She denies anhedonia, low motivation, hopelessness. She is sleeping well. [name] denies SI/HI. [name] denies any manic and hypomanic like symptoms.     Observations/Objective: I spoke with [name] on the phone.  Pt was calm, pleasant and cooperative.  Pt was engaged in the conversation and answered questions appropriately.  Speech was clear and coherent with normal rate, tone and volume.  Mood is euthymic and affect is full. Thought processes are coherent, goal oriented and intact.  Thought content is logical.  Pt denies SI/HI.   Pt denies auditory and visual hallucinations and did not appear to be responding to internal stimuli.  Memory and concentration are good.  Fund of knowledge and use of language are average.  Insight and judgment are fair.  I am unable to comment on psychomotor activity, general appearance, hygiene, or eye contact as I was unable to physically see the patient on the phone.  Vital signs not available since interview  conducted virtually.    I reviewed the information below on 05/22/2019 and have updated it Assessment and Plan: MDD-recurrent, severe without psychotic features versus bipolar disorder- significantly improved; GAD-significantly improved; insomnia-ongoing; cluster B traits; rule out PTSD  Status of current symptoms: stable   Lunesta 2 mg p.o. nightly as needed insomnia-plan to wean off.  Patient is using her CPAP now.   Cymbalta 60 mg p.o. twice daily   Remeron 60 mg p.o. nightly   Ativan 1 mg p.o. twice daily as needed anxiety- no refill and pt does not think she has taken it for a few months   Lithobid 600 mg p.o. daily   Labs: ordered lithium level and CMP  Reviewed labs done on 05/08/2019 Creatinine 1.22  Follow Up Instructions: In 16 weeks or sooner if needed   I discussed the assessment and treatment plan with the patient. The patient was provided an opportunity to ask questions and all were answered. The patient agreed with the plan and demonstrated an understanding of the instructions.   The patient was advised to call back or seek an in-person evaluation if the symptoms worsen or if the condition fails to improve as anticipated.  I provided 20 minutes of non-face-to-face time during this encounter.   Charlcie Cradle, MD

## 2019-05-23 ENCOUNTER — Other Ambulatory Visit (HOSPITAL_COMMUNITY): Payer: Self-pay

## 2019-05-23 DIAGNOSIS — Z79899 Other long term (current) drug therapy: Secondary | ICD-10-CM | POA: Diagnosis not present

## 2019-05-24 LAB — COMPREHENSIVE METABOLIC PANEL
ALT: 53 IU/L — ABNORMAL HIGH (ref 0–32)
AST: 45 IU/L — ABNORMAL HIGH (ref 0–40)
Albumin/Globulin Ratio: 1.9 (ref 1.2–2.2)
Albumin: 4.6 g/dL (ref 3.8–4.9)
Alkaline Phosphatase: 129 IU/L — ABNORMAL HIGH (ref 39–117)
BUN/Creatinine Ratio: 24 — ABNORMAL HIGH (ref 9–23)
BUN: 25 mg/dL — ABNORMAL HIGH (ref 6–24)
Bilirubin Total: 0.3 mg/dL (ref 0.0–1.2)
CO2: 24 mmol/L (ref 20–29)
Calcium: 9.9 mg/dL (ref 8.7–10.2)
Chloride: 99 mmol/L (ref 96–106)
Creatinine, Ser: 1.03 mg/dL — ABNORMAL HIGH (ref 0.57–1.00)
GFR calc Af Amer: 72 mL/min/{1.73_m2} (ref >59)
GFR calc non Af Amer: 63 mL/min/{1.73_m2} (ref >59)
Globulin, Total: 2.4 g/dL (ref 1.5–4.5)
Glucose: 134 mg/dL — ABNORMAL HIGH (ref 65–99)
Potassium: 5 mmol/L (ref 3.5–5.2)
Sodium: 137 mmol/L (ref 134–144)
Total Protein: 7 g/dL (ref 6.0–8.5)

## 2019-05-24 LAB — LITHIUM LEVEL: Lithium Lvl: 1.1 mmol/L (ref 0.6–1.2)

## 2019-05-27 ENCOUNTER — Telehealth (HOSPITAL_COMMUNITY): Payer: Self-pay

## 2019-05-27 NOTE — Telephone Encounter (Signed)
Patient is calling about her lab results, there are quite a few abnormal's, please review and advise, thank you.   Patient is aware you will not review until Thursday.

## 2019-05-29 NOTE — Telephone Encounter (Signed)
Lithium level is fine. Her AST and ALT (liver enzymes) are elevated. I don't have anything to compare with so I recommend she follow up with her PCP

## 2019-05-30 NOTE — Telephone Encounter (Signed)
Called patient and faxed a copy of her labs to PCP - Dr. Moreen Fowler at [place].

## 2019-06-05 DIAGNOSIS — I10 Essential (primary) hypertension: Secondary | ICD-10-CM | POA: Diagnosis not present

## 2019-06-05 DIAGNOSIS — E039 Hypothyroidism, unspecified: Secondary | ICD-10-CM | POA: Diagnosis not present

## 2019-06-05 DIAGNOSIS — E1165 Type 2 diabetes mellitus with hyperglycemia: Secondary | ICD-10-CM | POA: Diagnosis not present

## 2019-06-05 DIAGNOSIS — E7800 Pure hypercholesterolemia, unspecified: Secondary | ICD-10-CM | POA: Diagnosis not present

## 2019-06-05 DIAGNOSIS — M1990 Unspecified osteoarthritis, unspecified site: Secondary | ICD-10-CM | POA: Diagnosis not present

## 2019-06-05 DIAGNOSIS — R69 Illness, unspecified: Secondary | ICD-10-CM | POA: Diagnosis not present

## 2019-06-05 DIAGNOSIS — M159 Polyosteoarthritis, unspecified: Secondary | ICD-10-CM | POA: Diagnosis not present

## 2019-06-05 DIAGNOSIS — E1169 Type 2 diabetes mellitus with other specified complication: Secondary | ICD-10-CM | POA: Diagnosis not present

## 2019-06-05 DIAGNOSIS — E785 Hyperlipidemia, unspecified: Secondary | ICD-10-CM | POA: Diagnosis not present

## 2019-06-06 ENCOUNTER — Telehealth: Payer: Self-pay | Admitting: Pulmonary Disease

## 2019-06-06 NOTE — Telephone Encounter (Signed)
CPAP 05/12/19 to 05/25/19 >> used on 14 of 14 nights with average 7 hrs 36 min.  Average AHI 1.5 with CPAP 16 cm H2O.   Please let her know CPAP download shows very good control of sleep apnea with current settings.

## 2019-06-12 DIAGNOSIS — G4733 Obstructive sleep apnea (adult) (pediatric): Secondary | ICD-10-CM | POA: Diagnosis not present

## 2019-06-17 ENCOUNTER — Ambulatory Visit (INDEPENDENT_AMBULATORY_CARE_PROVIDER_SITE_OTHER): Payer: Medicare HMO | Admitting: Licensed Clinical Social Worker

## 2019-06-17 ENCOUNTER — Encounter (HOSPITAL_COMMUNITY): Payer: Self-pay | Admitting: Licensed Clinical Social Worker

## 2019-06-17 ENCOUNTER — Other Ambulatory Visit: Payer: Self-pay

## 2019-06-17 DIAGNOSIS — R69 Illness, unspecified: Secondary | ICD-10-CM | POA: Diagnosis not present

## 2019-06-17 DIAGNOSIS — F411 Generalized anxiety disorder: Secondary | ICD-10-CM

## 2019-06-17 DIAGNOSIS — F331 Major depressive disorder, recurrent, moderate: Secondary | ICD-10-CM | POA: Diagnosis not present
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
## 2019-06-17 NOTE — Telephone Encounter (Signed)
Called the patient and made her aware of the results. Patient voiced understanding. Nothing further needed at this time.

## 2019-06-18 ENCOUNTER — Ambulatory Visit: Payer: Medicare HMO | Admitting: Cardiovascular Disease

## 2019-06-30 DIAGNOSIS — R0789 Other chest pain: Secondary | ICD-10-CM | POA: Diagnosis not present

## 2019-07-01 ENCOUNTER — Other Ambulatory Visit: Payer: Self-pay

## 2019-07-01 ENCOUNTER — Ambulatory Visit (INDEPENDENT_AMBULATORY_CARE_PROVIDER_SITE_OTHER): Payer: Medicare HMO | Admitting: Family Medicine

## 2019-07-01 ENCOUNTER — Encounter: Payer: Self-pay | Admitting: Family Medicine

## 2019-07-01 VITALS — BP 93/59 | HR 57 | Temp 97.6°F | Ht 63.0 in | Wt 302.2 lb

## 2019-07-01 DIAGNOSIS — Z9989 Dependence on other enabling machines and devices: Secondary | ICD-10-CM

## 2019-07-01 DIAGNOSIS — G4733 Obstructive sleep apnea (adult) (pediatric): Secondary | ICD-10-CM

## 2019-07-01 LAB — BASIC METABOLIC PANEL
BUN/Creatinine Ratio: 28 — ABNORMAL HIGH (ref 9–23)
BUN: 30 mg/dL — ABNORMAL HIGH (ref 6–24)
CO2: 28 mmol/L (ref 20–29)
Calcium: 9.5 mg/dL (ref 8.7–10.2)
Chloride: 101 mmol/L (ref 96–106)
Creatinine, Ser: 1.07 mg/dL — ABNORMAL HIGH (ref 0.57–1.00)
GFR calc Af Amer: 69 mL/min/{1.73_m2} (ref >59)
GFR calc non Af Amer: 60 mL/min/{1.73_m2} (ref >59)
Glucose: 142 mg/dL — ABNORMAL HIGH (ref 65–99)
Potassium: 5 mmol/L (ref 3.5–5.2)
Sodium: 139 mmol/L (ref 134–144)

## 2019-07-01 LAB — CBC
Hematocrit: 36 % (ref 34.0–46.6)
Hemoglobin: 11.7 g/dL (ref 11.1–15.9)
MCH: 30.4 pg (ref 26.6–33.0)
MCHC: 32.5 g/dL (ref 31.5–35.7)
MCV: 94 fL (ref 79–97)
Platelets: 230 10*3/uL (ref 150–450)
RBC: 3.85 x10E6/uL (ref 3.77–5.28)
RDW: 13.1 % (ref 11.7–15.4)
WBC: 7.3 10*3/uL (ref 3.4–10.8)
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
## 2019-07-01 NOTE — Patient Instructions (Signed)
Continue CPAP nightly and for greater than 4 hours each night   Follow up in 1 year, sooner if needed   Sleep Apnea Sleep apnea affects breathing during sleep. It causes breathing to stop for a short time or to become shallow. It can also increase the risk of:  Heart attack.  Stroke.  Being very overweight (obese).  Diabetes.  Heart failure.  Irregular heartbeat. The goal of treatment is to help you breathe normally again. What are the causes? There are three kinds of sleep apnea:  Obstructive sleep apnea. This is caused by a blocked or collapsed airway.  Central sleep apnea. This happens when the brain does not send the right signals to the muscles that control breathing.  Mixed sleep apnea. This is a combination of obstructive and central sleep apnea. The most common cause of this condition is a collapsed or blocked airway. This can happen if:  Your throat muscles are too relaxed.  Your tongue and tonsils are too large.  You are overweight.  Your airway is too small. What increases the risk?  Being overweight.  Smoking.  Having a small airway.  Being older.  Being female.  Drinking alcohol.  Taking medicines to calm yourself (sedatives or tranquilizers).  Having family members with the condition. What are the signs or symptoms?  Trouble staying asleep.  Being sleepy or tired during the day.  Getting angry a lot.  Loud snoring.  Headaches in the morning.  Not being able to focus your mind (concentrate).  Forgetting things.  Less interest in sex.  Mood swings.  Personality changes.  Feelings of sadness (depression).  Waking up a lot during the night to pee (urinate).  Dry mouth.  Sore throat. How is this diagnosed?  Your medical history.  A physical exam.  A test that is done when you are sleeping (sleep study). The test is most often done in a sleep lab but may also be done at home. How is this treated?   Sleeping on your  side.  Using a medicine to get rid of mucus in your nose (decongestant).  Avoiding the use of alcohol, medicines to help you relax, or certain pain medicines (narcotics).  Losing weight, if needed.  Changing your diet.  Not smoking.  Using a machine to open your airway while you sleep, such as: ? An oral appliance. This is a mouthpiece that shifts your lower jaw forward. ? A CPAP device. This device blows air through a mask when you breathe out (exhale). ? An EPAP device. This has valves that you put in each nostril. ? A BPAP device. This device blows air through a mask when you breathe in (inhale) and breathe out.  Having surgery if other treatments do not work. It is important to get treatment for sleep apnea. Without treatment, it can lead to:  High blood pressure.  Coronary artery disease.  In men, not being able to have an erection (impotence).  Reduced thinking ability. Follow these instructions at home: Lifestyle  Make changes that your doctor recommends.  Eat a healthy diet.  Lose weight if needed.  Avoid alcohol, medicines to help you relax, and some pain medicines.  Do not use any products that contain nicotine or tobacco, such as cigarettes, e-cigarettes, and chewing tobacco. If you need help quitting, ask your doctor. General instructions  Take over-the-counter and prescription medicines only as told by your doctor.  If you were given a machine to use while you sleep, use it  only as told by your doctor.  If you are having surgery, make sure to tell your doctor you have sleep apnea. You may need to bring your device with you.  Keep all follow-up visits as told by your doctor. This is important. Contact a doctor if:  The machine that you were given to use during sleep bothers you or does not seem to be working.  You do not get better.  You get worse. Get help right away if:  Your chest hurts.  You have trouble breathing in enough air.  You have  an uncomfortable feeling in your back, arms, or stomach.  You have trouble talking.  One side of your body feels weak.  A part of your face is hanging down. These symptoms may be an emergency. Do not wait to see if the symptoms will go away. Get medical help right away. Call your local emergency services (911 in the [place] Do not drive yourself to the hospital. Summary  This condition affects breathing during sleep.  The most common cause is a collapsed or blocked airway.  The goal of treatment is to help you breathe normally while you sleep. This information is not intended to replace advice given to you by your health care provider. Make sure you discuss any questions you have with your health care provider. Document Released: 04/11/2008 Document Revised: 04/19/2018 Document Reviewed: 02/26/2018 Elsevier Patient Education  2020 [employer].

## 2019-07-02 ENCOUNTER — Telehealth (HOSPITAL_COMMUNITY): Payer: Self-pay | Admitting: Emergency Medicine

## 2019-07-02 NOTE — Telephone Encounter (Signed)
Should not be BID. 2 hours prior to scan per your protocol

## 2019-07-02 NOTE — Telephone Encounter (Signed)
Reaching out to patient to offer assistance regarding upcoming cardiac imaging study; pt verbalizes understanding of appt date/time, parking situation and where to check in, pre-test NPO status and medications ordered, and verified current allergies; name and call back number provided for further questions should they arise Dusty Raczkowski RN Navigator Cardiac Imaging [facility] [phone] office [phone] cell 

## 2019-07-02 NOTE — Telephone Encounter (Signed)
Phone call with patient to confirm that metoprolol tartrate 100mg  should only be taken once 2 hr prior to cardiac CTA appt tomorrow, and not twice daily like she has been taking it.  Pt verbalized understanding. Thanked me for the call  Marchia Bond RN Navigator Cardiac Imaging [facility] [phone] Office  [phone] Cell

## 2019-07-03 ENCOUNTER — Ambulatory Visit (HOSPITAL_COMMUNITY)
Admission: RE | Admit: 2019-07-03 | Discharge: 2019-07-03 | Disposition: A | Discharge status: Home / Self Care | Payer: Medicare HMO | Source: Ambulatory Visit | Attending: Cardiovascular Disease | Admitting: Cardiovascular Disease

## 2019-07-03 ENCOUNTER — Encounter: Payer: Medicare HMO | Admitting: *Deleted

## 2019-07-03 ENCOUNTER — Other Ambulatory Visit: Payer: Self-pay

## 2019-07-03 DIAGNOSIS — R0789 Other chest pain: Secondary | ICD-10-CM | POA: Insufficient documentation

## 2019-07-03 DIAGNOSIS — R079 Chest pain, unspecified: Secondary | ICD-10-CM

## 2019-07-03 DIAGNOSIS — I2510 Atherosclerotic heart disease of native coronary artery without angina pectoris: Secondary | ICD-10-CM | POA: Insufficient documentation

## 2019-07-03 DIAGNOSIS — Z006 Encounter for examination for normal comparison and control in clinical research program: Secondary | ICD-10-CM

## 2019-07-03 MED ORDER — NITROGLYCERIN 0.4 MG SL SUBL: 0.4000 mg | SUBLINGUAL_TABLET | Freq: Once | SUBLINGUAL | Status: DC

## 2019-07-03 MED ORDER — IOHEXOL 350 MG/ML SOLN
80.0000 mL | Freq: Once | INTRAVENOUS | Status: AC | PRN
  Administered 2019-07-03: 80 mL via INTRAVENOUS

## 2019-07-03 NOTE — Research (Signed)
CADFEM Informed Consent                  Subject Name:   [name]   Subject met inclusion and exclusion criteria.  The informed consent form, study requirements and expectations were reviewed with the subject and questions and concerns were addressed prior to the signing of the consent form.  The subject verbalized understanding of the trial requirements.  The subject agreed to participate in the CADFEM trial and signed the informed consent.  The informed consent was obtained prior to performance of any protocol-specific procedures for the subject.  A copy of the signed informed consent was given to the subject and a copy was placed in the subject's medical record.   [place] Milianna Ericsson, Research Assistant  07/03/2019 08:29 a.m.

## 2019-07-12 DIAGNOSIS — G4733 Obstructive sleep apnea (adult) (pediatric): Secondary | ICD-10-CM | POA: Diagnosis not present

## 2019-07-15 ENCOUNTER — Telehealth: Payer: Self-pay | Admitting: Cardiovascular Disease

## 2019-07-15 ENCOUNTER — Ambulatory Visit: Payer: Medicare HMO | Admitting: Cardiovascular Disease

## 2019-07-15 NOTE — Telephone Encounter (Signed)
Patient wants to know if she needs to keep her appt for today, as she was called with her results before Christmas.

## 2019-07-15 NOTE — Telephone Encounter (Signed)
Spoke with patient who already received test results and does not wish to follow up at this time. Appointment cancelled.

## 2019-07-17 DIAGNOSIS — E039 Hypothyroidism, unspecified: Secondary | ICD-10-CM | POA: Diagnosis not present

## 2019-07-17 DIAGNOSIS — E7800 Pure hypercholesterolemia, unspecified: Secondary | ICD-10-CM | POA: Diagnosis not present

## 2019-07-17 DIAGNOSIS — I10 Essential (primary) hypertension: Secondary | ICD-10-CM | POA: Diagnosis not present

## 2019-07-17 DIAGNOSIS — E1169 Type 2 diabetes mellitus with other specified complication: Secondary | ICD-10-CM | POA: Diagnosis not present

## 2019-07-17 DIAGNOSIS — M159 Polyosteoarthritis, unspecified: Secondary | ICD-10-CM | POA: Diagnosis not present

## 2019-07-17 DIAGNOSIS — E1165 Type 2 diabetes mellitus with hyperglycemia: Secondary | ICD-10-CM | POA: Diagnosis not present

## 2019-07-17 DIAGNOSIS — R69 Illness, unspecified: Secondary | ICD-10-CM | POA: Diagnosis not present

## 2019-07-17 DIAGNOSIS — M1990 Unspecified osteoarthritis, unspecified site: Secondary | ICD-10-CM | POA: Diagnosis not present

## 2019-07-17 DIAGNOSIS — E785 Hyperlipidemia, unspecified: Secondary | ICD-10-CM | POA: Diagnosis not present

## 2019-07-21 ENCOUNTER — Encounter (HOSPITAL_COMMUNITY): Payer: Self-pay | Admitting: Licensed Clinical Social Worker

## 2019-07-21 ENCOUNTER — Ambulatory Visit (INDEPENDENT_AMBULATORY_CARE_PROVIDER_SITE_OTHER): Payer: Medicare HMO | Admitting: Licensed Clinical Social Worker

## 2019-07-21 ENCOUNTER — Other Ambulatory Visit: Payer: Self-pay

## 2019-07-21 DIAGNOSIS — R69 Illness, unspecified: Secondary | ICD-10-CM | POA: Diagnosis not present

## 2019-07-21 DIAGNOSIS — F330 Major depressive disorder, recurrent, mild: Secondary | ICD-10-CM | POA: Diagnosis not present

## 2019-07-21 DIAGNOSIS — F411 Generalized anxiety disorder: Secondary | ICD-10-CM

## 2019-07-21 NOTE — Progress Notes (Signed)
Virtual Visit via Telephone Note  I connected with [name] on 07/21/19 at 11:00 AM EST by telephone and verified that I am speaking with the correct person using two identifiers.     I discussed the limitations, risks, security and privacy concerns of performing an evaluation and management service by telephone and the availability of in person appointments. I also discussed with the patient that there may be a patient responsible charge related to this service. The patient expressed understanding and agreed to proceed.  Type of Therapy: Individual Therapy  Treatment Goals addressed: Improve Psychiatric Symptoms, Elevate Mood, Improve Unhelpful Thought Patterns, decrease anxiety, learn about diagnosis, healthy coping skills  Interventions: Motivational Interviewing, CBT, Grounding & Mindfulness Techniques  Summary: [name] is a 54 y.o. female who presents with Major Depressive Disorder, Recurrent moderate, and Generalized Anxiety Disorder  Suicidal/Homicidal: No -without intent/plan  Therapist Response:  [name] met with clinician for an individual session. [name] discussed her psychiatric symptoms and her current life events. [name] shared [name] has been doing much better lately. She reports that she is going through menopause and now that her periods have stopped, her moods have stabilized and she is feeling more energy and more emotionally stable. She reported she had nice holidays and was able to spend some time with her family. She also reports that she continues to move through her grief process for her mother in law, but it is going smoother than with her mother's death. Clinician validated this experience and encouraged [name] to allow feelings to flow as necessary. Clinician discussed physical health and noted some motivation to eat healthier, using her air fryer, and to get a little exercise. [name] also reported she is sitting with a neighbor who is 80, which gives  her something to do while her husband works.  [name] reported no significant depressive sxs, better sleep due to using CPAP every night (7-8 hours), and no anxiety.   Plan: Return again in 42month  Diagnosis: Axis I: Major Depressive Disorder, Recurrent Mild, and Generalized Anxiety Disorder     I discussed the assessment and treatment plan with the patient. The patient was provided an opportunity to ask questions and all were answered. The patient agreed with the plan and demonstrated an understanding of the instructions.   The patient was advised to call back or seek an in-person evaluation if the symptoms worsen or if the condition fails to improve as anticipated.  I provided 40 minutes of non-face-to-face time during this encounter.   JMindi Curling LCSW

## 2019-07-31 DIAGNOSIS — E1165 Type 2 diabetes mellitus with hyperglycemia: Secondary | ICD-10-CM | POA: Diagnosis not present

## 2019-07-31 DIAGNOSIS — R69 Illness, unspecified: Secondary | ICD-10-CM | POA: Diagnosis not present

## 2019-07-31 DIAGNOSIS — E7800 Pure hypercholesterolemia, unspecified: Secondary | ICD-10-CM | POA: Diagnosis not present

## 2019-07-31 DIAGNOSIS — M159 Polyosteoarthritis, unspecified: Secondary | ICD-10-CM | POA: Diagnosis not present

## 2019-07-31 DIAGNOSIS — E785 Hyperlipidemia, unspecified: Secondary | ICD-10-CM | POA: Diagnosis not present

## 2019-07-31 DIAGNOSIS — E1169 Type 2 diabetes mellitus with other specified complication: Secondary | ICD-10-CM | POA: Diagnosis not present

## 2019-07-31 DIAGNOSIS — M1990 Unspecified osteoarthritis, unspecified site: Secondary | ICD-10-CM | POA: Diagnosis not present

## 2019-07-31 DIAGNOSIS — E039 Hypothyroidism, unspecified: Secondary | ICD-10-CM | POA: Diagnosis not present

## 2019-07-31 DIAGNOSIS — I10 Essential (primary) hypertension: Secondary | ICD-10-CM | POA: Diagnosis not present

## 2019-08-01 DIAGNOSIS — R69 Illness, unspecified: Secondary | ICD-10-CM | POA: Diagnosis not present

## 2019-08-11 DIAGNOSIS — Z961 Presence of intraocular lens: Secondary | ICD-10-CM | POA: Diagnosis not present

## 2019-08-11 DIAGNOSIS — Z794 Long term (current) use of insulin: Secondary | ICD-10-CM | POA: Diagnosis not present

## 2019-08-11 DIAGNOSIS — E119 Type 2 diabetes mellitus without complications: Secondary | ICD-10-CM | POA: Diagnosis not present

## 2019-08-11 DIAGNOSIS — H16223 Keratoconjunctivitis sicca, not specified as Sjogren's, bilateral: Secondary | ICD-10-CM | POA: Diagnosis not present

## 2019-08-11 DIAGNOSIS — H40013 Open angle with borderline findings, low risk, bilateral: Secondary | ICD-10-CM | POA: Diagnosis not present

## 2019-08-11 DIAGNOSIS — H4711 Papilledema associated with increased intracranial pressure: Secondary | ICD-10-CM | POA: Diagnosis not present

## 2019-08-12 DIAGNOSIS — G4733 Obstructive sleep apnea (adult) (pediatric): Secondary | ICD-10-CM | POA: Diagnosis not present

## 2019-08-17 DIAGNOSIS — E039 Hypothyroidism, unspecified: Secondary | ICD-10-CM | POA: Diagnosis not present

## 2019-08-17 DIAGNOSIS — R69 Illness, unspecified: Secondary | ICD-10-CM | POA: Diagnosis not present

## 2019-08-17 DIAGNOSIS — E785 Hyperlipidemia, unspecified: Secondary | ICD-10-CM | POA: Diagnosis not present

## 2019-08-17 DIAGNOSIS — E7800 Pure hypercholesterolemia, unspecified: Secondary | ICD-10-CM | POA: Diagnosis not present

## 2019-08-17 DIAGNOSIS — E1165 Type 2 diabetes mellitus with hyperglycemia: Secondary | ICD-10-CM | POA: Diagnosis not present

## 2019-08-17 DIAGNOSIS — M159 Polyosteoarthritis, unspecified: Secondary | ICD-10-CM | POA: Diagnosis not present

## 2019-08-17 DIAGNOSIS — M1990 Unspecified osteoarthritis, unspecified site: Secondary | ICD-10-CM | POA: Diagnosis not present

## 2019-08-17 DIAGNOSIS — I10 Essential (primary) hypertension: Secondary | ICD-10-CM | POA: Diagnosis not present

## 2019-08-17 DIAGNOSIS — E1169 Type 2 diabetes mellitus with other specified complication: Secondary | ICD-10-CM | POA: Diagnosis not present

## 2019-08-19 ENCOUNTER — Other Ambulatory Visit (HOSPITAL_COMMUNITY): Payer: Self-pay | Admitting: Psychiatry

## 2019-08-19 DIAGNOSIS — F411 Generalized anxiety disorder: Secondary | ICD-10-CM

## 2019-08-19 DIAGNOSIS — F332 Major depressive disorder, recurrent severe without psychotic features: Secondary | ICD-10-CM

## 2019-08-29 ENCOUNTER — Telehealth (HOSPITAL_COMMUNITY): Payer: Self-pay

## 2019-08-29 DIAGNOSIS — M1990 Unspecified osteoarthritis, unspecified site: Secondary | ICD-10-CM | POA: Diagnosis not present

## 2019-08-29 DIAGNOSIS — M159 Polyosteoarthritis, unspecified: Secondary | ICD-10-CM | POA: Diagnosis not present

## 2019-08-29 DIAGNOSIS — I10 Essential (primary) hypertension: Secondary | ICD-10-CM | POA: Diagnosis not present

## 2019-08-29 DIAGNOSIS — E039 Hypothyroidism, unspecified: Secondary | ICD-10-CM | POA: Diagnosis not present

## 2019-08-29 DIAGNOSIS — E1165 Type 2 diabetes mellitus with hyperglycemia: Secondary | ICD-10-CM | POA: Diagnosis not present

## 2019-08-29 DIAGNOSIS — E785 Hyperlipidemia, unspecified: Secondary | ICD-10-CM | POA: Diagnosis not present

## 2019-08-29 DIAGNOSIS — R69 Illness, unspecified: Secondary | ICD-10-CM | POA: Diagnosis not present

## 2019-08-29 DIAGNOSIS — E7800 Pure hypercholesterolemia, unspecified: Secondary | ICD-10-CM | POA: Diagnosis not present

## 2019-08-29 DIAGNOSIS — E1169 Type 2 diabetes mellitus with other specified complication: Secondary | ICD-10-CM | POA: Diagnosis not present

## 2019-08-29 NOTE — Telephone Encounter (Signed)
Pharmacy called requesting a refill on patient's Duloxetine 60mg  sent to [facility] on [address] in [place]. Patient has a scheduled appointment on 09/11/19. Thank you.

## 2019-09-01 ENCOUNTER — Other Ambulatory Visit (HOSPITAL_COMMUNITY): Payer: Self-pay | Admitting: Psychiatry

## 2019-09-01 DIAGNOSIS — F332 Major depressive disorder, recurrent severe without psychotic features: Secondary | ICD-10-CM

## 2019-09-01 DIAGNOSIS — F411 Generalized anxiety disorder: Secondary | ICD-10-CM

## 2019-09-11 ENCOUNTER — Ambulatory Visit (INDEPENDENT_AMBULATORY_CARE_PROVIDER_SITE_OTHER): Payer: Medicare HMO | Admitting: Psychiatry

## 2019-09-11 ENCOUNTER — Encounter (HOSPITAL_COMMUNITY): Payer: Self-pay | Admitting: Psychiatry

## 2019-09-11 ENCOUNTER — Other Ambulatory Visit: Payer: Self-pay

## 2019-09-11 DIAGNOSIS — F332 Major depressive disorder, recurrent severe without psychotic features: Secondary | ICD-10-CM | POA: Diagnosis not present

## 2019-09-11 DIAGNOSIS — F99 Mental disorder, not otherwise specified: Secondary | ICD-10-CM

## 2019-09-11 DIAGNOSIS — F5105 Insomnia due to other mental disorder: Secondary | ICD-10-CM | POA: Diagnosis not present

## 2019-09-11 DIAGNOSIS — F411 Generalized anxiety disorder: Secondary | ICD-10-CM | POA: Diagnosis not present

## 2019-09-11 DIAGNOSIS — R69 Illness, unspecified: Secondary | ICD-10-CM | POA: Diagnosis not present

## 2019-09-11 MED ORDER — LORAZEPAM 1 MG PO TABS: 1.0000 mg | ORAL_TABLET | Freq: Two times a day (BID) | ORAL | 2 refills | Status: DC

## 2019-09-11 MED ORDER — ESZOPICLONE 2 MG PO TABS: 2.0000 mg | ORAL_TABLET | Freq: Every evening | ORAL | 2 refills | Status: DC | PRN

## 2019-09-11 MED ORDER — MIRTAZAPINE 30 MG PO TABS: 60.0000 mg | ORAL_TABLET | Freq: Every day | ORAL | 2 refills | Status: DC

## 2019-09-11 MED ORDER — LITHIUM CARBONATE ER 450 MG PO TBCR: 450.0000 mg | EXTENDED_RELEASE_TABLET | Freq: Every day | ORAL | 2 refills | Status: DC

## 2019-09-11 MED ORDER — DULOXETINE HCL 60 MG PO CPEP: 60.0000 mg | ORAL_CAPSULE | Freq: Two times a day (BID) | ORAL | 2 refills | Status: DC

## 2019-09-11 NOTE — Progress Notes (Signed)
Virtual Visit via Telephone Note  I connected with [name] on 09/11/19 at 11:30 AM EST by telephone and verified that I am speaking with the correct person using two identifiers.  Location: Patient: home Provider: office   I discussed the limitations, risks, security and privacy concerns of performing an evaluation and management service by telephone and the availability of in person appointments. I also discussed with the patient that there may be a patient responsible charge related to this service. The patient expressed understanding and agreed to proceed.   History of Present Illness:  "I am doing better". She is not as stressed as she was. Her irritability and anger have significantly improved. She has a random few days where she feels sad but didn't think she is depressed. She is only sleeping 3-4 hrs/night due to racing thoughts about financial stress. The [name] is not working. She is always tired. She denies anxiety during the day. [name] denies SI/HI. [name] is social and trying to stay busy.    Observations/Objective:  General Appearance: unable to assess  Eye Contact:  unable to assess  Speech:  Clear and Coherent and Normal Rate  Volume:  Normal  Mood:  Euthymic  Affect:  Full Range  Thought Process:  Goal Directed, Linear and Descriptions of Associations: Intact  Orientation:  Full (Time, Place, and Person)  Thought Content:  Logical  Suicidal Thoughts:  No  Homicidal Thoughts:  No  Memory:  Immediate;   Good  Judgement:  Good  Insight:  Good  Psychomotor Activity: unable to assess  Concentration:  Concentration: Good  Recall:  Good  Fund of Knowledge:  Good  Language:  Good  Akathisia:  unable to assess  Handed:  Right  AIMS (if indicated):     Assets:  Communication Skills Desire for Improvement Financial Resources/Insurance Housing Intimacy Leisure Time Resilience Social Support Talents/Skills Transportation  ADL's:  unable to assess   Cognition:  WNL  Sleep:         Assessment and Plan: I reviewed the labs done on 05/23/2019 that showed Lithium level 1.1 06/30/19 shows elevated BUN and Creatinine  I have decreased the dose of Lithium and will recheck labs at her next visit.  Severe episode of recurrent major depressive disorder, without psychotic features ([place]) - Plan: DULoxetine (CYMBALTA) 60 MG capsule, lithium carbonate (ESKALITH) 450 MG CR tablet, mirtazapine (REMERON) 30 MG tablet  GAD (generalized anxiety disorder) - Plan: DULoxetine (CYMBALTA) 60 MG capsule, LORazepam (ATIVAN) 1 MG tablet, mirtazapine (REMERON) 30 MG tablet  Insomnia due to other mental disorder - Plan: eszopiclone (LUNESTA) 2 MG TABS tablet, mirtazapine (REMERON) 30 MG tablet    Follow Up Instructions: 3 mo or sooner if needed   I discussed the assessment and treatment plan with the patient. The patient was provided an opportunity to ask questions and all were answered. The patient agreed with the plan and demonstrated an understanding of the instructions.   The patient was advised to call back or seek an in-person evaluation if the symptoms worsen or if the condition fails to improve as anticipated.  I provided 15 minutes of non-face-to-face time during this encounter.   Charlcie Cradle, MD

## 2019-09-12 DIAGNOSIS — G4733 Obstructive sleep apnea (adult) (pediatric): Secondary | ICD-10-CM | POA: Diagnosis not present

## 2019-10-02 DIAGNOSIS — R69 Illness, unspecified: Secondary | ICD-10-CM | POA: Diagnosis not present

## 2019-10-03 DIAGNOSIS — R69 Illness, unspecified: Secondary | ICD-10-CM | POA: Diagnosis not present

## 2019-10-03 DIAGNOSIS — E7800 Pure hypercholesterolemia, unspecified: Secondary | ICD-10-CM | POA: Diagnosis not present

## 2019-10-03 DIAGNOSIS — Z0000 Encounter for general adult medical examination without abnormal findings: Secondary | ICD-10-CM | POA: Diagnosis not present

## 2019-10-03 DIAGNOSIS — N3281 Overactive bladder: Secondary | ICD-10-CM | POA: Diagnosis not present

## 2019-10-03 DIAGNOSIS — E1169 Type 2 diabetes mellitus with other specified complication: Secondary | ICD-10-CM | POA: Diagnosis not present

## 2019-10-03 DIAGNOSIS — L219 Seborrheic dermatitis, unspecified: Secondary | ICD-10-CM | POA: Diagnosis not present

## 2019-10-03 DIAGNOSIS — Z1389 Encounter for screening for other disorder: Secondary | ICD-10-CM | POA: Diagnosis not present

## 2019-10-03 DIAGNOSIS — E039 Hypothyroidism, unspecified: Secondary | ICD-10-CM | POA: Diagnosis not present

## 2019-10-03 DIAGNOSIS — L84 Corns and callosities: Secondary | ICD-10-CM | POA: Diagnosis not present

## 2019-10-03 DIAGNOSIS — I10 Essential (primary) hypertension: Secondary | ICD-10-CM | POA: Diagnosis not present

## 2019-10-06 DIAGNOSIS — M159 Polyosteoarthritis, unspecified: Secondary | ICD-10-CM | POA: Diagnosis not present

## 2019-10-06 DIAGNOSIS — R69 Illness, unspecified: Secondary | ICD-10-CM | POA: Diagnosis not present

## 2019-10-06 DIAGNOSIS — E7800 Pure hypercholesterolemia, unspecified: Secondary | ICD-10-CM | POA: Diagnosis not present

## 2019-10-06 DIAGNOSIS — E785 Hyperlipidemia, unspecified: Secondary | ICD-10-CM | POA: Diagnosis not present

## 2019-10-06 DIAGNOSIS — G4700 Insomnia, unspecified: Secondary | ICD-10-CM | POA: Diagnosis not present

## 2019-10-06 DIAGNOSIS — I10 Essential (primary) hypertension: Secondary | ICD-10-CM | POA: Diagnosis not present

## 2019-10-06 DIAGNOSIS — M1990 Unspecified osteoarthritis, unspecified site: Secondary | ICD-10-CM | POA: Diagnosis not present

## 2019-10-06 DIAGNOSIS — E039 Hypothyroidism, unspecified: Secondary | ICD-10-CM | POA: Diagnosis not present

## 2019-10-06 DIAGNOSIS — E1165 Type 2 diabetes mellitus with hyperglycemia: Secondary | ICD-10-CM | POA: Diagnosis not present

## 2019-10-10 DIAGNOSIS — G4733 Obstructive sleep apnea (adult) (pediatric): Secondary | ICD-10-CM | POA: Diagnosis not present

## 2019-10-21 ENCOUNTER — Ambulatory Visit (INDEPENDENT_AMBULATORY_CARE_PROVIDER_SITE_OTHER): Payer: Medicare HMO | Admitting: Licensed Clinical Social Worker

## 2019-10-21 ENCOUNTER — Encounter (HOSPITAL_COMMUNITY): Payer: Self-pay | Admitting: Licensed Clinical Social Worker

## 2019-10-21 ENCOUNTER — Other Ambulatory Visit: Payer: Self-pay

## 2019-10-21 DIAGNOSIS — F411 Generalized anxiety disorder: Secondary | ICD-10-CM | POA: Diagnosis not present

## 2019-10-21 DIAGNOSIS — F330 Major depressive disorder, recurrent, mild: Secondary | ICD-10-CM

## 2019-10-21 DIAGNOSIS — R69 Illness, unspecified: Secondary | ICD-10-CM | POA: Diagnosis not present

## 2019-10-21 NOTE — Progress Notes (Signed)
Virtual Visit via Telephone Note  I connected with [name] on 10/21/19 at 10:00 AM EDT by telephone and verified that I am speaking with the correct person using two identifiers.    I discussed the limitations, risks, security and privacy concerns of performing an evaluation and management service by telephone and the availability of in person appointments. I also discussed with the patient that there may be a patient responsible charge related to this service. The patient expressed understanding and agreed to proceed.  Type of Therapy: Individual Therapy   Treatment Goals addressed: Improve Psychiatric Symptoms, Elevate Mood, Improve Unhelpful Thought Patterns, decrease anxiety, learn about diagnosis, healthy coping skills   Interventions: Motivational Interviewing, CBT, Grounding & Mindfulness Techniques   Summary: [name] is a 54 y.o. female who presents with Major Depressive Disorder, Recurrent moderate, and Generalized Anxiety Disorder   Suicidal/Homicidal: No -without intent/plan   Therapist Response:   [name] met with clinician for an individual session. [name] discussed her psychiatric symptoms and her current life events. [name] shared that she is doing fine. Clinician explored mood, interactions, and activities using MI OARS. Clinician identified that [name] [place] currently [place] the beach with the woman she cares for daily. Clinician processed the impact of this job and how it has been helpful in getting her up and out of the house, focusing on someone else rather than her own grief, and giving her some responsibility. Clinician explored any concerns from [name], who reports that for the most part, life is good. She does report lack of motivation to clean up her house and get things organized. Clinician offered support and some advice about organizational skills, setting small and tangible goals, taking breaks, asking for help, etc.    Plan: Return again in 2-3 months    Diagnosis: Axis I: Major Depressive Disorder, Recurrent Mild, and Generalized Anxiety Disorder      I discussed the assessment and treatment plan with the patient. The patient was provided an opportunity to ask questions and all were answered. The patient agreed with the plan and demonstrated an understanding of the instructions.   The patient was advised to call back or seek an in-person evaluation if the symptoms worsen or if the condition fails to improve as anticipated.  I provided 30 minutes of non-face-to-face time during this encounter.   Jessica Curling, LCSW

## 2019-10-28 DIAGNOSIS — G4700 Insomnia, unspecified: Secondary | ICD-10-CM | POA: Diagnosis not present

## 2019-10-28 DIAGNOSIS — M1990 Unspecified osteoarthritis, unspecified site: Secondary | ICD-10-CM | POA: Diagnosis not present

## 2019-10-28 DIAGNOSIS — E785 Hyperlipidemia, unspecified: Secondary | ICD-10-CM | POA: Diagnosis not present

## 2019-10-28 DIAGNOSIS — E1165 Type 2 diabetes mellitus with hyperglycemia: Secondary | ICD-10-CM | POA: Diagnosis not present

## 2019-10-28 DIAGNOSIS — I10 Essential (primary) hypertension: Secondary | ICD-10-CM | POA: Diagnosis not present

## 2019-10-28 DIAGNOSIS — M159 Polyosteoarthritis, unspecified: Secondary | ICD-10-CM | POA: Diagnosis not present

## 2019-10-28 DIAGNOSIS — R69 Illness, unspecified: Secondary | ICD-10-CM | POA: Diagnosis not present

## 2019-10-28 DIAGNOSIS — E039 Hypothyroidism, unspecified: Secondary | ICD-10-CM | POA: Diagnosis not present

## 2019-10-28 DIAGNOSIS — E7800 Pure hypercholesterolemia, unspecified: Secondary | ICD-10-CM | POA: Diagnosis not present

## 2019-11-10 DIAGNOSIS — G4733 Obstructive sleep apnea (adult) (pediatric): Secondary | ICD-10-CM | POA: Diagnosis not present

## 2019-11-26 DIAGNOSIS — R69 Illness, unspecified: Secondary | ICD-10-CM | POA: Diagnosis not present

## 2019-11-26 DIAGNOSIS — E785 Hyperlipidemia, unspecified: Secondary | ICD-10-CM | POA: Diagnosis not present

## 2019-11-26 DIAGNOSIS — E039 Hypothyroidism, unspecified: Secondary | ICD-10-CM | POA: Diagnosis not present

## 2019-11-26 DIAGNOSIS — M159 Polyosteoarthritis, unspecified: Secondary | ICD-10-CM | POA: Diagnosis not present

## 2019-11-26 DIAGNOSIS — G4700 Insomnia, unspecified: Secondary | ICD-10-CM | POA: Diagnosis not present

## 2019-11-26 DIAGNOSIS — E1165 Type 2 diabetes mellitus with hyperglycemia: Secondary | ICD-10-CM | POA: Diagnosis not present

## 2019-11-26 DIAGNOSIS — I10 Essential (primary) hypertension: Secondary | ICD-10-CM | POA: Diagnosis not present

## 2019-11-26 DIAGNOSIS — E7800 Pure hypercholesterolemia, unspecified: Secondary | ICD-10-CM | POA: Diagnosis not present

## 2019-11-26 DIAGNOSIS — M1990 Unspecified osteoarthritis, unspecified site: Secondary | ICD-10-CM | POA: Diagnosis not present

## 2019-12-04 ENCOUNTER — Other Ambulatory Visit (HOSPITAL_COMMUNITY): Payer: Self-pay

## 2019-12-04 ENCOUNTER — Other Ambulatory Visit: Payer: Self-pay

## 2019-12-04 ENCOUNTER — Telehealth (INDEPENDENT_AMBULATORY_CARE_PROVIDER_SITE_OTHER): Payer: Medicare HMO | Admitting: Psychiatry

## 2019-12-04 ENCOUNTER — Encounter (HOSPITAL_COMMUNITY): Payer: Self-pay | Admitting: Psychiatry

## 2019-12-04 DIAGNOSIS — F99 Mental disorder, not otherwise specified: Secondary | ICD-10-CM | POA: Diagnosis not present

## 2019-12-04 DIAGNOSIS — F411 Generalized anxiety disorder: Secondary | ICD-10-CM

## 2019-12-04 DIAGNOSIS — F332 Major depressive disorder, recurrent severe without psychotic features: Secondary | ICD-10-CM | POA: Diagnosis not present

## 2019-12-04 DIAGNOSIS — F5105 Insomnia due to other mental disorder: Secondary | ICD-10-CM

## 2019-12-04 DIAGNOSIS — Z79899 Other long term (current) drug therapy: Secondary | ICD-10-CM

## 2019-12-04 DIAGNOSIS — R69 Illness, unspecified: Secondary | ICD-10-CM | POA: Diagnosis not present

## 2019-12-04 MED ORDER — DULOXETINE HCL 60 MG PO CPEP: 60.0000 mg | ORAL_CAPSULE | Freq: Two times a day (BID) | ORAL | 2 refills | Status: DC

## 2019-12-04 MED ORDER — LORAZEPAM 1 MG PO TABS: 1.0000 mg | ORAL_TABLET | Freq: Two times a day (BID) | ORAL | 2 refills | Status: DC

## 2019-12-04 MED ORDER — MIRTAZAPINE 30 MG PO TABS: 60.0000 mg | ORAL_TABLET | Freq: Every day | ORAL | 2 refills | Status: DC

## 2019-12-04 MED ORDER — LITHIUM CARBONATE ER 450 MG PO TBCR: 450.0000 mg | EXTENDED_RELEASE_TABLET | Freq: Every day | ORAL | 2 refills | Status: DC

## 2019-12-04 MED ORDER — ESZOPICLONE 3 MG PO TABS: 3.0000 mg | ORAL_TABLET | Freq: Every evening | ORAL | 2 refills | Status: DC | PRN

## 2019-12-04 NOTE — Progress Notes (Signed)
Virtual Visit via Telephone Note  I connected with [name] on 12/04/19 at 11:30 AM EDT by telephone and verified that I am speaking with the correct person using two identifiers.  Location: Patient: home Provider: office   I discussed the limitations, risks, security and privacy concerns of performing an evaluation and management service by telephone and the availability of in person appointments. I also discussed with the patient that there may be a patient responsible charge related to this service. The patient expressed understanding and agreed to proceed.   History of Present Illness: [name] shares that she is doing well. She has been helping to care for an elderly family friend. She has stress related to her marriage and finances. [name] has not been sleeping well and is only getting about 2 hrs of sleep in early morning. She does not nap during the day. Her energy is low. Her depression seems to be coming back. She has low motivation and anhedonia. She has increased appetite but nothing is satisfying. [name] denies overwhelming anxiety. She denies SI/HI.    Observations/Objective:  General Appearance: unable to assess  Eye Contact:  unable to assess  Speech:  Clear and Coherent and Normal Rate  Volume:  Normal  Mood:  Depressed  Affect:  Congruent  Thought Process:  Goal Directed, Linear and Descriptions of Associations: Intact  Orientation:  Full (Time, Place, and Person)  Thought Content:  Logical  Suicidal Thoughts:  No  Homicidal Thoughts:  No  Memory:  Immediate;   Good  Judgement:  Good  Insight:  Good  Psychomotor Activity: unable to assess  Concentration:  Concentration: Good  Recall:  Good  Fund of Knowledge:  Good  Language:  Good  Akathisia:  unable to assess  Handed:  Right  AIMS (if indicated):     Assets:  Communication Skills Desire for Improvement Financial Resources/Insurance Housing Intimacy Resilience Social  Support Talents/Skills Transportation Vocational/Educational  ADL's:  unable to assess  Cognition:  WNL  Sleep:        I reviewed the information below on 12/04/19 and have updated it Assessment and Plan: MDD-recurrent, severe without psychotic features versus bipolar disorder- significantly improved; GAD-significantly improved; insomnia-ongoing; cluster B traits; rule out PTSD    Cymbalta 60mg  po BID Lithium 450mg  po qD- may increase dose Remeron 60mg  po qHS Increase Lunesta 3mg  po qHS Ativan 1mg  po BID prn  Labs: ordered Lithium level, CMP with Bun/Creatinine  Follow Up Instructions: In 6-10 weeks or sooner if needed   I discussed the assessment and treatment plan with the patient. The patient was provided an opportunity to ask questions and all were answered. The patient agreed with the plan and demonstrated an understanding of the instructions.   The patient was advised to call back or seek an in-person evaluation if the symptoms worsen or if the condition fails to improve as anticipated.  I provided 15 minutes of non-face-to-face time during this encounter.   Charlcie Cradle, MD

## 2019-12-08 DIAGNOSIS — Z79899 Other long term (current) drug therapy: Secondary | ICD-10-CM | POA: Diagnosis not present

## 2019-12-09 LAB — COMPREHENSIVE METABOLIC PANEL
ALT: 50 IU/L — ABNORMAL HIGH (ref 0–32)
AST: 43 IU/L — ABNORMAL HIGH (ref 0–40)
Albumin/Globulin Ratio: 1.8 (ref 1.2–2.2)
Albumin: 4.5 g/dL (ref 3.8–4.9)
Alkaline Phosphatase: 156 IU/L — ABNORMAL HIGH (ref 48–121)
BUN/Creatinine Ratio: 18 (ref 9–23)
BUN: 19 mg/dL (ref 6–24)
Bilirubin Total: 0.4 mg/dL (ref 0.0–1.2)
CO2: 24 mmol/L (ref 20–29)
Calcium: 9.6 mg/dL (ref 8.7–10.2)
Chloride: 98 mmol/L (ref 96–106)
Creatinine, Ser: 1.03 mg/dL — ABNORMAL HIGH (ref 0.57–1.00)
GFR calc Af Amer: 72 mL/min/{1.73_m2} (ref >59)
GFR calc non Af Amer: 63 mL/min/{1.73_m2} (ref >59)
Globulin, Total: 2.5 g/dL (ref 1.5–4.5)
Glucose: 125 mg/dL — ABNORMAL HIGH (ref 65–99)
Potassium: 4.9 mmol/L (ref 3.5–5.2)
Sodium: 137 mmol/L (ref 134–144)
Total Protein: 7 g/dL (ref 6.0–8.5)

## 2019-12-09 LAB — LITHIUM LEVEL: Lithium Lvl: 0.2 mmol/L — ABNORMAL LOW (ref 0.6–1.2)

## 2019-12-10 DIAGNOSIS — G4733 Obstructive sleep apnea (adult) (pediatric): Secondary | ICD-10-CM | POA: Diagnosis not present

## 2019-12-16 ENCOUNTER — Other Ambulatory Visit (HOSPITAL_COMMUNITY): Payer: Self-pay | Admitting: Psychiatry

## 2019-12-16 DIAGNOSIS — F99 Mental disorder, not otherwise specified: Secondary | ICD-10-CM

## 2019-12-16 DIAGNOSIS — F5105 Insomnia due to other mental disorder: Secondary | ICD-10-CM

## 2019-12-17 ENCOUNTER — Ambulatory Visit (INDEPENDENT_AMBULATORY_CARE_PROVIDER_SITE_OTHER): Payer: Medicare HMO

## 2019-12-17 ENCOUNTER — Ambulatory Visit: Payer: Medicare HMO | Admitting: Podiatry

## 2019-12-17 ENCOUNTER — Other Ambulatory Visit: Payer: Self-pay

## 2019-12-17 ENCOUNTER — Other Ambulatory Visit: Payer: Self-pay | Admitting: Podiatry

## 2019-12-17 ENCOUNTER — Encounter: Payer: Self-pay | Admitting: Podiatry

## 2019-12-17 VITALS — BP 157/91 | HR 94

## 2019-12-17 DIAGNOSIS — M205X2 Other deformities of toe(s) (acquired), left foot: Secondary | ICD-10-CM

## 2019-12-17 DIAGNOSIS — M205X1 Other deformities of toe(s) (acquired), right foot: Secondary | ICD-10-CM

## 2019-12-17 DIAGNOSIS — M79671 Pain in right foot: Secondary | ICD-10-CM

## 2019-12-17 DIAGNOSIS — M79672 Pain in left foot: Secondary | ICD-10-CM

## 2019-12-17 NOTE — Progress Notes (Signed)
This patient presents to the office stating that she has a painful callus on the great toes of both feet.  She says that she was referred to this office by her medical doctor.  She says these calluses are painful walking and wearing her shoes.  She also says that she has pain through both feet that has been present for years.  She says that she feels she walks on the outside of both feet and has even worn insoles and braces to help her  feet function better.  She states that she feels she has pain all through her feet especially after long periods of time walking.   She presents the office today for treatment of her painful callus both big toes and an evaluation of her long-term painful feet .  Patient is diabetic with HgA1C of 8.0  Vascular  Dorsalis pedis and posterior tibial pulses are palpable  B/L.  Capillary return  WNL.  Temperature gradient is  WNL.  Skin turgor  WNL  Sensorium  Senn Weinstein monofilament wire  WNL. Normal tactile sensation.  Nail Exam  Patient has normal nails with no evidence of bacterial or fungal infection.  Orthopedic  Exam  Muscle tone and muscle strength  WNL.  No limitations of motion feet  B/L.  No crepitus or joint effusion noted.  Foot type is unremarkable and digits show no abnormalities.  Significant dorsal medial exostosis first metatarsal bilaterally/patient also has limited dorsiflexion of the first MPJ bilaterally.  Patient has flexible pes planus foot type bilaterally.    Skin  No open lesions.  Normal skin texture and turgor. Pinch callus hallux  B/L.    Hallux Limitus 1st MPJ  B/L.   Pinch callus hallux  Left  IE.  X-rays taken of the right foot reveal Meta tarsal primus elevatus  and severe spurring at the plantar fascia of the right foot.  There is a flattened calcaneal inclination angle right foot.  The left foot has an HAV deformity first MPJ with a met primus cellulitis.  Mild spurring noted at the insertion of the plantar fascia left foot.   Debridement of the pinch callus with a #15 blade was performed left hallux.  Discussed this condition with this patient.  Told her that she could benefit from surgical intervention and I will refer her to one of the surgical podiatrists in the office for an evaluation and treatment.  Return to clinic as needed to see Dr. Paulla Dolly.   Gardiner Barefoot DPM

## 2019-12-22 ENCOUNTER — Ambulatory Visit (INDEPENDENT_AMBULATORY_CARE_PROVIDER_SITE_OTHER): Payer: Medicare HMO | Admitting: Licensed Clinical Social Worker

## 2019-12-22 ENCOUNTER — Encounter (HOSPITAL_COMMUNITY): Payer: Self-pay | Admitting: Licensed Clinical Social Worker

## 2019-12-22 ENCOUNTER — Other Ambulatory Visit: Payer: Self-pay

## 2019-12-22 DIAGNOSIS — R69 Illness, unspecified: Secondary | ICD-10-CM | POA: Diagnosis not present

## 2019-12-22 DIAGNOSIS — F330 Major depressive disorder, recurrent, mild: Secondary | ICD-10-CM | POA: Diagnosis not present

## 2019-12-22 DIAGNOSIS — F411 Generalized anxiety disorder: Secondary | ICD-10-CM

## 2019-12-22 NOTE — Progress Notes (Signed)
Virtual Visit via Telephone Note  I connected with [name] on 12/22/19 at 10:00 AM EDT by telephone and verified that I am speaking with the correct person using two identifiers.  Location: Patient: home Provider: office   I discussed the limitations, risks, security and privacy concerns of performing an evaluation and management service by telephone and the availability of in person appointments. I also discussed with the patient that there may be a patient responsible charge related to this service. The patient expressed understanding and agreed to proceed.   Type of Therapy: Individual Therapy   Treatment Goals addressed: Improve Psychiatric Symptoms, Elevate Mood, Improve Unhelpful Thought Patterns, decrease anxiety, learn about diagnosis, healthy coping skills   Interventions: Motivational Interviewing, CBT, Grounding & Mindfulness Techniques   Summary: [name] is a 54 y.o. female who presents with Major Depressive Disorder, Recurrent moderate, and Generalized Anxiety Disorder   Suicidal/Homicidal: No -without intent/plan   Therapist Response:   [name] met with clinician for an individual session. [name] discussed her psychiatric symptoms and her current life events. [name] shared that she is doing fine. Clinician explored mood, interactions, and activities using MI OARS. [name] processed current stressors, primarily financial, which has led her to do more work in caregiving for her neighbor. Clinician discussed [name] concerns about her husband and provided feedback about seeking out counseling for him through hospice. Clinician noted the significant change in his ability to cope since losing his mother, as well as losing his job.   [name] reports she may have to have surgery on her foot next month. Clinician processed concerns about the surgery and her ability to walk well on her foot.   Plan: Return again in 1-2 months   Diagnosis: Axis I: Major Depressive Disorder,  Recurrent Mild, and Generalized Anxiety Disorder      I discussed the assessment and treatment plan with the patient. The patient was provided an opportunity to ask questions and all were answered. The patient agreed with the plan and demonstrated an understanding of the instructions.   The patient was advised to call back or seek an in-person evaluation if the symptoms worsen or if the condition fails to improve as anticipated.  I provided 20 minutes of non-face-to-face time during this encounter.   [name], LCSW

## 2019-12-23 DIAGNOSIS — G4733 Obstructive sleep apnea (adult) (pediatric): Secondary | ICD-10-CM | POA: Diagnosis not present

## 2019-12-31 ENCOUNTER — Encounter: Payer: Self-pay | Admitting: Podiatry

## 2019-12-31 ENCOUNTER — Ambulatory Visit: Payer: Medicare HMO | Admitting: Podiatry

## 2019-12-31 ENCOUNTER — Other Ambulatory Visit: Payer: Self-pay

## 2019-12-31 DIAGNOSIS — M722 Plantar fascial fibromatosis: Secondary | ICD-10-CM

## 2019-12-31 DIAGNOSIS — M205X1 Other deformities of toe(s) (acquired), right foot: Secondary | ICD-10-CM | POA: Diagnosis not present

## 2019-12-31 NOTE — Progress Notes (Signed)
Subjective:   Patient ID: [name], female   DOB: 54 y.o.   MRN: [id number]   HPI Patient presents stating she has a lot of pain in her feet in general left over right and also has bunion deformity left over right that she states becomes symptomatic especially with shoe gear.  States obesity is a complicating factor and she tries as best as possible to stay active and does not smoke and has an A1c of 8 but it is coming down quickly as she is doing a better job on diet   Review of Systems  All other systems reviewed and are negative.       Objective:  Physical Exam Vitals and nursing note reviewed.  Constitutional:      Appearance: She is well-developed.  Pulmonary:     Effort: Pulmonary effort is normal.  Musculoskeletal:        General: Normal range of motion.  Skin:    General: Skin is warm.  Neurological:     Mental Status: She is alert.     Neurovascular status found to be intact muscle strength adequate with patient noted to have moderate hallux limitus structural bunion functional in nature left over right with redness around the side has significant flatfoot deformity with obesity is complicating factor.  I did note the majority the discomfort in the plantar heel left over right with inflammation fluid buildup noted around the medial band.  Patient does have diminishment of sharp dull vibratory bilateral and has an A1c currently of 8 point     Assessment:  HAV hallux limitus deformity left over right flatfoot deformity plantar fascial symptomatology left over right with inflammation noted and pain along with moderate diabetic neuropathy     Plan:  H&P reviewed all conditions.  At this point I am get a focus on the discomfort that is acute and I did inject the fascia 3 mg Kenalog 5 g liken applied fascial brace to lift up the arch discussed diabetic shoes to try to increase her cushion coefficient provide a wider toe box and did discuss consideration long-term for  surgical intervention.  Educated her on surgical intervention and we will make that consideration based on her response to continued conservative treatment  X-rays indicate that there is elevated first metatarsal segment bilateral large plantar spur formation bilateral and structural bunion deformity left over right

## 2020-01-09 DIAGNOSIS — E1165 Type 2 diabetes mellitus with hyperglycemia: Secondary | ICD-10-CM | POA: Diagnosis not present

## 2020-01-09 DIAGNOSIS — E039 Hypothyroidism, unspecified: Secondary | ICD-10-CM | POA: Diagnosis not present

## 2020-01-09 DIAGNOSIS — I10 Essential (primary) hypertension: Secondary | ICD-10-CM | POA: Diagnosis not present

## 2020-01-09 DIAGNOSIS — R69 Illness, unspecified: Secondary | ICD-10-CM | POA: Diagnosis not present

## 2020-01-09 DIAGNOSIS — E7800 Pure hypercholesterolemia, unspecified: Secondary | ICD-10-CM | POA: Diagnosis not present

## 2020-01-09 DIAGNOSIS — E785 Hyperlipidemia, unspecified: Secondary | ICD-10-CM | POA: Diagnosis not present

## 2020-01-09 DIAGNOSIS — M159 Polyosteoarthritis, unspecified: Secondary | ICD-10-CM | POA: Diagnosis not present

## 2020-01-09 DIAGNOSIS — M1990 Unspecified osteoarthritis, unspecified site: Secondary | ICD-10-CM | POA: Diagnosis not present

## 2020-01-09 DIAGNOSIS — G4700 Insomnia, unspecified: Secondary | ICD-10-CM | POA: Diagnosis not present

## 2020-01-10 DIAGNOSIS — G4733 Obstructive sleep apnea (adult) (pediatric): Secondary | ICD-10-CM | POA: Diagnosis not present

## 2020-01-12 ENCOUNTER — Other Ambulatory Visit: Payer: Self-pay

## 2020-01-12 ENCOUNTER — Ambulatory Visit: Payer: Medicare HMO | Admitting: Orthotics

## 2020-01-12 DIAGNOSIS — M722 Plantar fascial fibromatosis: Secondary | ICD-10-CM

## 2020-01-12 DIAGNOSIS — M205X1 Other deformities of toe(s) (acquired), right foot: Secondary | ICD-10-CM

## 2020-01-12 DIAGNOSIS — M2140 Flat foot [pes planus] (acquired), unspecified foot: Secondary | ICD-10-CM

## 2020-01-12 NOTE — Progress Notes (Signed)

## 2020-01-15 ENCOUNTER — Telehealth (INDEPENDENT_AMBULATORY_CARE_PROVIDER_SITE_OTHER): Payer: Medicare HMO | Admitting: Psychiatry

## 2020-01-15 ENCOUNTER — Other Ambulatory Visit: Payer: Self-pay

## 2020-01-15 DIAGNOSIS — F332 Major depressive disorder, recurrent severe without psychotic features: Secondary | ICD-10-CM | POA: Diagnosis not present

## 2020-01-15 DIAGNOSIS — F5105 Insomnia due to other mental disorder: Secondary | ICD-10-CM | POA: Diagnosis not present

## 2020-01-15 DIAGNOSIS — F99 Mental disorder, not otherwise specified: Secondary | ICD-10-CM

## 2020-01-15 DIAGNOSIS — F411 Generalized anxiety disorder: Secondary | ICD-10-CM

## 2020-01-15 DIAGNOSIS — R69 Illness, unspecified: Secondary | ICD-10-CM | POA: Diagnosis not present

## 2020-01-15 MED ORDER — LORAZEPAM 1 MG PO TABS: 1.0000 mg | ORAL_TABLET | Freq: Two times a day (BID) | ORAL | 2 refills | Status: DC

## 2020-01-15 MED ORDER — LITHIUM CARBONATE ER 450 MG PO TBCR: 450.0000 mg | EXTENDED_RELEASE_TABLET | Freq: Every day | ORAL | 2 refills | Status: DC

## 2020-01-15 MED ORDER — DULOXETINE HCL 60 MG PO CPEP: 60.0000 mg | ORAL_CAPSULE | Freq: Two times a day (BID) | ORAL | 2 refills | Status: DC

## 2020-01-15 MED ORDER — MIRTAZAPINE 30 MG PO TABS: 60.0000 mg | ORAL_TABLET | Freq: Every day | ORAL | 2 refills | Status: DC

## 2020-01-15 MED ORDER — ESZOPICLONE 3 MG PO TABS: 3.0000 mg | ORAL_TABLET | Freq: Every evening | ORAL | 2 refills | Status: DC | PRN

## 2020-01-15 NOTE — Progress Notes (Signed)
Virtual Visit via Telephone Note  I connected with [name] on 01/15/20 at  2:15 PM EDT by telephone and verified that I am speaking with the correct person using two identifiers.  Location: Patient: at her nephew's rehearsal dinner Provider: office   I discussed the limitations, risks, security and privacy concerns of performing an evaluation and management service by telephone and the availability of in person appointments. I also discussed with the patient that there may be a patient responsible charge related to this service. The patient expressed understanding and agreed to proceed.   History of Present Illness: The anniversary of her mother's death is coming up. [name] is depressed and irritable. [name] is struggling to avoid her feelings. She misses her mother every day. She is crying a lot more. She denies isolation. Her sleep is not well due to taking her meds late. [name] denies SI/HI. Her anxiety is mild and manageable. Anxiety is mostly situational.    Observations/Objective:  General Appearance: unable to assess  Eye Contact:  unable to assess  Speech:  Clear and Coherent and Normal Rate  Volume:  Normal  Mood:  Depressed  Affect:  Depressed and Tearful  Thought Process:  Goal Directed and Descriptions of Associations: Intact  Orientation:  Full (Time, Place, and Person)  Thought Content:  Logical  Suicidal Thoughts:  No  Homicidal Thoughts:  No  Memory:  Immediate;   Good  Judgement:  Good  Insight:  Good  Psychomotor Activity: unable to assess  Concentration:  Concentration: Good  Recall:  Good  Fund of Knowledge:  Good  Language:  Good  Akathisia:  unable to assess  Handed:  Right  AIMS (if indicated):     Assets:  Communication Skills Desire for Improvement Financial Resources/Insurance Housing Intimacy Talents/Skills Transportation Vocational/Educational  ADL's:  unable to assess  Cognition:  WNL  Sleep:        I reviewed the information below  01/15/20 and have updated it Assessment and Plan:  MDD-recurrent, severe without psychotic features versus bipolar disorder; GAD; insomnia; cluster B traits; rule out PTSD      Cymbalta 60mg  po BID Lithium 450mg  po qD Remeron 60mg  po qHS  Lunesta 3mg  po qHS Ativan 1mg  po BID prn   Reviewed labs done on 12/08/19 Lithium level, CMP with Bun 19 and Creatinine 1.03; lithium level 0.2  Encouraged to continue therapy   Follow Up Instructions: In 2 months or sooner if needed   I discussed the assessment and treatment plan with the patient. The patient was provided an opportunity to ask questions and all were answered. The patient agreed with the plan and demonstrated an understanding of the instructions.   The patient was advised to call back or seek an in-person evaluation if the symptoms worsen or if the condition fails to improve as anticipated.  I provided 10 minutes of non-face-to-face time during this encounter.   Charlcie Cradle, MD

## 2020-01-21 ENCOUNTER — Encounter: Payer: Self-pay | Admitting: Podiatry

## 2020-01-21 ENCOUNTER — Ambulatory Visit: Payer: Medicare HMO | Admitting: Podiatry

## 2020-01-21 ENCOUNTER — Other Ambulatory Visit: Payer: Self-pay

## 2020-01-21 VITALS — Temp 98.1°F

## 2020-01-21 DIAGNOSIS — M205X1 Other deformities of toe(s) (acquired), right foot: Secondary | ICD-10-CM | POA: Diagnosis not present

## 2020-01-21 DIAGNOSIS — M2042 Other hammer toe(s) (acquired), left foot: Secondary | ICD-10-CM | POA: Diagnosis not present

## 2020-01-21 NOTE — Progress Notes (Signed)
Subjective:   Patient ID: [name], female   DOB: 54 y.o.   MRN: [id number]   HPI Patient presents stating the big toe joint left is still really bothering her along with the second toe.  States she said chronic pain in the bottom of her feet with flat feet and is hoping diabetic shoes will help with the big toe joint pain is getting worse and she wants it fixed.  States that her A1c is been around 6 and her fasting sugars around 110   ROS      Objective:  Physical Exam  Neurovascular status intact negative Bevelyn Buckles' sign noted with good circulatory status and good digital perfusion noted bilateral.  Patient is found to have reduced range of motion first MPJ bilateral with spur formation pain in the big toe joint and elevated second digit left over right with keratotic lesion.  She is tried wider shoes she is tried soaking and medication without relief of symptoms      Assessment:  Hallux limitus rigidus left over right hammertoe deformity with chronic flatfoot deformity     Plan:  H&P discussed conditions.  At this point I do think osteotomy first metatarsal left long digital fusion would give her the best chance of reduction of a good portion of her pain with hopeful diabetic shoes hoping to reduce stress against her arch and take pressure off her feet.  I did explain absolutely there is no guarantee that this will reduce all of her pain and ultimately she may require fusion of the big toe joint or implant.  She understands this completely and I allowed her to read consent form going over conservative treatments and surgical complications.  She is willing to accept risk and after extensive review she signed consent form and is scheduled for outpatient surgery and is encouraged to call with questions concerns which may come up.  I did dispense air fracture walker as I want her to get used to it prior to the procedure and find another shoe that would fit comfortably with it

## 2020-01-22 ENCOUNTER — Telehealth: Payer: Self-pay

## 2020-01-22 DIAGNOSIS — G4733 Obstructive sleep apnea (adult) (pediatric): Secondary | ICD-10-CM | POA: Diagnosis not present

## 2020-01-22 NOTE — Telephone Encounter (Signed)
DOS 02/03/2020  [facility] BUNIONECTOM[facility] LT - 28296 [facility] REPAIR 2ND LT - 58727  [employer] MEDICARE EFFECTIVE DATE - 07/17/2017  PLAN DEDUCTIBLE - $0.00 OUT OF POCKET - $5000 w/ $4628.70 remaining COPAY $20.00 COINSURANCE - 100%  PER AUTOMATED SYSTEM, NO PRECERT REQUIRED [place]PT [address] [place] [address]. CALL REF # [id number]

## 2020-01-29 ENCOUNTER — Ambulatory Visit (INDEPENDENT_AMBULATORY_CARE_PROVIDER_SITE_OTHER): Payer: Medicare HMO | Admitting: Licensed Clinical Social Worker

## 2020-01-29 ENCOUNTER — Other Ambulatory Visit: Payer: Self-pay

## 2020-01-29 ENCOUNTER — Encounter (HOSPITAL_COMMUNITY): Payer: Self-pay | Admitting: Licensed Clinical Social Worker

## 2020-01-29 DIAGNOSIS — F411 Generalized anxiety disorder: Secondary | ICD-10-CM | POA: Diagnosis not present

## 2020-01-29 DIAGNOSIS — R69 Illness, unspecified: Secondary | ICD-10-CM | POA: Diagnosis not present

## 2020-01-29 DIAGNOSIS — F330 Major depressive disorder, recurrent, mild: Secondary | ICD-10-CM | POA: Diagnosis not present

## 2020-01-29 NOTE — Progress Notes (Signed)
Virtual Visit via Telephone Note  I connected with [name] on 01/29/20 at 10:00 AM EDT by telephone and verified that I am speaking with the correct person using two identifiers.  Location: Patient: home Provider: office   I discussed the limitations, risks, security and privacy concerns of performing an evaluation and management service by telephone and the availability of in person appointments. I also discussed with the patient that there may be a patient responsible charge related to this service. The patient expressed understanding and agreed to proceed.   Type of Therapy: Individual Therapy   Treatment Goals addressed: Improve Psychiatric Symptoms, Elevate Mood, Improve Unhelpful Thought Patterns, decrease anxiety, learn about diagnosis, healthy coping skills   Interventions: Motivational Interviewing   Summary: [name] is a 54 y.o. female who presents with Major Depressive Disorder, Recurrent moderate, and Generalized Anxiety Disorder   Suicidal/Homicidal: No -without intent/plan   Therapist Response:   [name] met with clinician for an individual session. [name] discussed her psychiatric symptoms and her current life events. [name] shared that has been more depressed lately, missing her mother and feeling unable to process why it is so painful. Clinician utilized MI OARS to reflect and summarize the pain she feels and the ongoing frustration of not being able to move on. Clinician validated thoughts and feelings about missing mom and reassured her that this was part of the grief process. Clinician explored other stressors that are happening. [name] reported that she has been trying to find her biological father for the past 10 years and she thinks she found him, but is now unable to get his family to communicate with her. Clinician reflected the sadness and frustration. Clinician also processed [name]'s feelings of being an orphan. Clinician reassured [name] and offered  possible resources through [employer] to get a DNA test.    Plan: Return again in 1-2 months   Diagnosis: Axis I: Major Depressive Disorder, Recurrent Mild, and Generalized Anxiety Disorder      I discussed the assessment and treatment plan with the patient. The patient was provided an opportunity to ask questions and all were answered. The patient agreed with the plan and demonstrated an understanding of the instructions.   The patient was advised to call back or seek an in-person evaluation if the symptoms worsen or if the condition fails to improve as anticipated.  I provided 55 minutes of non-face-to-face time during this encounter.   [name], LCSW

## 2020-02-02 MED ORDER — ONDANSETRON HCL 4 MG PO TABS: 4.0000 mg | ORAL_TABLET | Freq: Three times a day (TID) | ORAL | 0 refills | Status: DC | PRN

## 2020-02-02 MED ORDER — OXYCODONE-ACETAMINOPHEN 10-325 MG PO TABS: 1.0000 | ORAL_TABLET | ORAL | 0 refills | Status: DC | PRN

## 2020-02-02 NOTE — Addendum Note (Signed)
Addended by: Wallene Huh on: 02/02/2020 01:33 PM   Modules accepted: Orders

## 2020-02-03 ENCOUNTER — Encounter: Payer: Self-pay | Admitting: Podiatry

## 2020-02-03 DIAGNOSIS — M2012 Hallux valgus (acquired), left foot: Secondary | ICD-10-CM | POA: Diagnosis not present

## 2020-02-03 DIAGNOSIS — M2042 Other hammer toe(s) (acquired), left foot: Secondary | ICD-10-CM | POA: Diagnosis not present

## 2020-02-03 DIAGNOSIS — I10 Essential (primary) hypertension: Secondary | ICD-10-CM | POA: Diagnosis not present

## 2020-02-05 DIAGNOSIS — G4700 Insomnia, unspecified: Secondary | ICD-10-CM | POA: Diagnosis not present

## 2020-02-05 DIAGNOSIS — M1990 Unspecified osteoarthritis, unspecified site: Secondary | ICD-10-CM | POA: Diagnosis not present

## 2020-02-05 DIAGNOSIS — M159 Polyosteoarthritis, unspecified: Secondary | ICD-10-CM | POA: Diagnosis not present

## 2020-02-05 DIAGNOSIS — E7800 Pure hypercholesterolemia, unspecified: Secondary | ICD-10-CM | POA: Diagnosis not present

## 2020-02-05 DIAGNOSIS — I10 Essential (primary) hypertension: Secondary | ICD-10-CM | POA: Diagnosis not present

## 2020-02-05 DIAGNOSIS — R69 Illness, unspecified: Secondary | ICD-10-CM | POA: Diagnosis not present

## 2020-02-05 DIAGNOSIS — E039 Hypothyroidism, unspecified: Secondary | ICD-10-CM | POA: Diagnosis not present

## 2020-02-05 DIAGNOSIS — E785 Hyperlipidemia, unspecified: Secondary | ICD-10-CM | POA: Diagnosis not present

## 2020-02-05 DIAGNOSIS — E1165 Type 2 diabetes mellitus with hyperglycemia: Secondary | ICD-10-CM | POA: Diagnosis not present

## 2020-02-09 DIAGNOSIS — G4733 Obstructive sleep apnea (adult) (pediatric): Secondary | ICD-10-CM | POA: Diagnosis not present

## 2020-02-11 ENCOUNTER — Ambulatory Visit (INDEPENDENT_AMBULATORY_CARE_PROVIDER_SITE_OTHER): Payer: Medicare HMO

## 2020-02-11 ENCOUNTER — Ambulatory Visit (INDEPENDENT_AMBULATORY_CARE_PROVIDER_SITE_OTHER): Payer: Medicare HMO | Admitting: Podiatry

## 2020-02-11 ENCOUNTER — Other Ambulatory Visit: Payer: Self-pay

## 2020-02-11 ENCOUNTER — Encounter: Payer: Self-pay | Admitting: Podiatry

## 2020-02-11 DIAGNOSIS — M2042 Other hammer toe(s) (acquired), left foot: Secondary | ICD-10-CM

## 2020-02-11 NOTE — Progress Notes (Signed)
Subjective:   Patient ID: [name], female   DOB: 54 y.o.   MRN: [id number]   HPI Patient states doing very well with surgery and wondering about surgery on the other foot   ROS      Objective:  Physical Exam  Neurovascular status intact negative Bevelyn Buckles' sign noted patient's left foot healing very well wound edges well coapted digit 2 in good alignment pins in place     Assessment:  Doing well post forefoot surgery left     Plan:  Reviewed x-ray sterile dressing reapplied instructed on continued complete immobilization elevation reappoint 2 weeks we will discuss the other foot  X-rays indicate osteotomies healing well toes in good alignment pins in place

## 2020-02-16 ENCOUNTER — Telehealth: Payer: Self-pay | Admitting: *Deleted

## 2020-02-16 NOTE — Telephone Encounter (Signed)
Patient called and left a message stating that the pain has come out some and would like to be seen and is Dr Mellody Drown patient and I called the patient back and left a message stating to call back and make an appointment to be seen. Lattie Haw

## 2020-02-17 ENCOUNTER — Telehealth: Payer: Self-pay

## 2020-02-18 NOTE — Telephone Encounter (Signed)
Hey, patient called on Monday and stated that the pin has come out some and I called the patient back and left a message stating that the patient needed to come in to see Dr Paulla Dolly and patient has an appointment on 02-25-2020 but I think she may need to come in. Thanks Lattie Haw

## 2020-02-19 ENCOUNTER — Encounter (HOSPITAL_COMMUNITY): Payer: Self-pay | Admitting: Licensed Clinical Social Worker

## 2020-02-19 ENCOUNTER — Ambulatory Visit (INDEPENDENT_AMBULATORY_CARE_PROVIDER_SITE_OTHER): Payer: Medicare HMO | Admitting: Licensed Clinical Social Worker

## 2020-02-19 ENCOUNTER — Other Ambulatory Visit: Payer: Self-pay

## 2020-02-19 DIAGNOSIS — F330 Major depressive disorder, recurrent, mild: Secondary | ICD-10-CM

## 2020-02-19 DIAGNOSIS — F411 Generalized anxiety disorder: Secondary | ICD-10-CM | POA: Diagnosis not present

## 2020-02-19 DIAGNOSIS — R69 Illness, unspecified: Secondary | ICD-10-CM | POA: Diagnosis not present

## 2020-02-19 NOTE — Progress Notes (Signed)
Virtual Visit via Telephone Note  I connected with [name] on 02/19/20 at 11:00 AM EDT by telephone and verified that I am speaking with the correct person using two identifiers.  Location: Patient: home Provider: office   I discussed the limitations, risks, security and privacy concerns of performing an evaluation and management service by telephone and the availability of in person appointments. I also discussed with the patient that there may be a patient responsible charge related to this service. The patient expressed understanding and agreed to proceed.  Type of Therapy: Individual Therapy   Treatment Goals addressed: Improve Psychiatric Symptoms, Elevate Mood, Improve Unhelpful Thought Patterns, decrease anxiety, learn about diagnosis, healthy coping skills   Interventions: Motivational Interviewing   Summary: [name] is a 54 y.o. female who presents with Major Depressive Disorder, Recurrent moderate, and Generalized Anxiety Disorder   Suicidal/Homicidal: No -without intent/plan   Therapist Response:   [name] met with clinician for an individual session. [name] discussed her psychiatric symptoms and her current life events. [name] shared that she continues to feel more depressed not only due to missing mom, but also due to being laid up after foot surgery. Clinician processed the experience of surgery and her current pain levels. Clinician discussed the frustration and depression that comes from not being able to move around on her own. Clinician also reflected that [name] had been much more active in the past several months while caring for her neighbor. Clinician utilized MI OARS to reflect and summarize issues with biological father's family, who will still not participate in a DNA test or even talk to her about it. Clinician encouraged [name] to give them space and to recognize how she presents herself with these people. Clinician also identified the importance of being  respectful and kind, as she does not know them and has no right to make demands of them.    Plan: Return again in 1-2 months   Diagnosis: Axis I: Major Depressive Disorder, Recurrent Mild, and Generalized Anxiety Disorder              I discussed the assessment and treatment plan with the patient. The patient was provided an opportunity to ask questions and all were answered. The patient agreed with the plan and demonstrated an understanding of the instructions.   The patient was advised to call back or seek an in-person evaluation if the symptoms worsen or if the condition fails to improve as anticipated.  I provided 45 minutes of non-face-to-face time during this encounter.   [name], LCSW

## 2020-02-22 DIAGNOSIS — G4733 Obstructive sleep apnea (adult) (pediatric): Secondary | ICD-10-CM | POA: Diagnosis not present

## 2020-02-25 ENCOUNTER — Ambulatory Visit (INDEPENDENT_AMBULATORY_CARE_PROVIDER_SITE_OTHER): Payer: Medicare HMO

## 2020-02-25 ENCOUNTER — Other Ambulatory Visit: Payer: Self-pay

## 2020-02-25 ENCOUNTER — Ambulatory Visit (INDEPENDENT_AMBULATORY_CARE_PROVIDER_SITE_OTHER): Payer: Medicare HMO | Admitting: Podiatry

## 2020-02-25 ENCOUNTER — Encounter: Payer: Self-pay | Admitting: Podiatry

## 2020-02-25 VITALS — Temp 97.3°F

## 2020-02-25 DIAGNOSIS — M205X1 Other deformities of toe(s) (acquired), right foot: Secondary | ICD-10-CM

## 2020-02-25 DIAGNOSIS — M2042 Other hammer toe(s) (acquired), left foot: Secondary | ICD-10-CM

## 2020-02-25 NOTE — Progress Notes (Signed)
Subjective:   Patient ID: [name], female   DOB: 54 y.o.   MRN: [id number]   HPI Patient presents stating that my pin on my second toe keeps getting traumatized by my dog and she wants it removed and states overall she is feeling good and also she needs to have her right foot fixed   ROS      Objective:  Physical Exam  Neurovascular status intact negative Bevelyn Buckles' sign noted wound edges coapted well digits in good alignment stitches intact and first MPJ is doing well with good range of motion with structural deformity of the right foot that she has questions about and when she should have surgery on     Assessment:  Doing well post osteotomy first metatarsal left digital fusion with hallux limitus rigidus deformity right     Plan:  H&P reviewed both conditions and for the left I remove the pin applied sterile dressing and instructed on continued elevation compression immobilization.  For the right we discussed hallux limitus repair and she wants this done we will plan on 6 weeks and I will see her back 4 weeks to go over greater detail today I made her aware of the postoperative course  X-rays left indicated osteotomy is healing well fixation in place good alignment noted with the second pin having moved distal secondary to trauma from her dog

## 2020-03-01 ENCOUNTER — Other Ambulatory Visit: Payer: Medicare HMO | Admitting: Orthotics

## 2020-03-09 ENCOUNTER — Other Ambulatory Visit: Payer: Self-pay

## 2020-03-09 ENCOUNTER — Ambulatory Visit (INDEPENDENT_AMBULATORY_CARE_PROVIDER_SITE_OTHER): Payer: Medicare HMO | Admitting: Orthotics

## 2020-03-09 DIAGNOSIS — M2042 Other hammer toe(s) (acquired), left foot: Secondary | ICD-10-CM

## 2020-03-09 DIAGNOSIS — E1140 Type 2 diabetes mellitus with diabetic neuropathy, unspecified: Secondary | ICD-10-CM

## 2020-03-09 DIAGNOSIS — M205X1 Other deformities of toe(s) (acquired), right foot: Secondary | ICD-10-CM

## 2020-03-09 DIAGNOSIS — M722 Plantar fascial fibromatosis: Secondary | ICD-10-CM

## 2020-03-09 NOTE — Progress Notes (Signed)

## 2020-03-11 ENCOUNTER — Telehealth (HOSPITAL_COMMUNITY): Payer: Medicare HMO | Admitting: Psychiatry

## 2020-03-11 ENCOUNTER — Other Ambulatory Visit: Payer: Self-pay

## 2020-03-11 DIAGNOSIS — F332 Major depressive disorder, recurrent severe without psychotic features: Secondary | ICD-10-CM

## 2020-03-11 DIAGNOSIS — F99 Mental disorder, not otherwise specified: Secondary | ICD-10-CM

## 2020-03-11 DIAGNOSIS — F5105 Insomnia due to other mental disorder: Secondary | ICD-10-CM

## 2020-03-11 DIAGNOSIS — F411 Generalized anxiety disorder: Secondary | ICD-10-CM

## 2020-03-11 NOTE — Progress Notes (Signed)
Virtual Visit via Telephone Note  I connected with [name] on 03/11/20 at  3:00 PM EDT by telephone and verified that I am speaking with the correct person using two identifiers.  Location: Patient: home  Provider: office   I discussed the limitations, risks, security and privacy concerns of performing an evaluation and management service by telephone and the availability of in person appointments. I also discussed with the patient that there may be a patient responsible charge related to this service. The patient expressed understanding and agreed to proceed.   History of Present Illness: The anniversary of her mother's death is coming up. Larita Fife recently found her birth father but they have not had any contact. He does not seem to want to talk to her. It makes her sad and feel unwanted. She denies SI/HI. She is having racing thoughts and is not sleeping well.     Observations/Objective:  General Appearance: unable to assess  Eye Contact:  unable to assess  Speech:  Clear and Coherent and Normal Rate  Volume:  Normal  Mood:  Depressed  Affect:  Full Range  Thought Process:  Goal Directed, Linear, and Descriptions of Associations: Intact  Orientation:  Full (Time, Place, and Person)  Thought Content:  Logical  Suicidal Thoughts:  No  Homicidal Thoughts:  No  Memory:  Immediate;   Good  Judgement:  Good  Insight:  Good  Psychomotor Activity: unable to assess  Concentration:  Concentration: Good  Recall:  Good  Fund of Knowledge:  Good  Language:  Good  Akathisia:  unable to assess  Handed:  Right  AIMS (if indicated):     Assets:  Communication Skills Desire for Improvement Financial Resources/Insurance Housing Resilience Social Support Talents/Skills Transportation Vocational/Educational  ADL's:  unable to assess  Cognition:  WNL  Sleep:          I reviewed the information below on 03/11/20 and have updated it Assessment and Plan:  MDD-recurrent, severe  without psychotic features versus bipolar disorder; GAD; insomnia; cluster B traits; rule out PTSD      Cymbalta 60mg  po BID Lithium 450mg  po qD Remeron 60mg  po qHS  Lunesta 3mg  po qHS Ativan 1mg  po BID prn      Encouraged to continue therapy      Follow Up Instructions: In 2-3 months or sooner if needed   I discussed the assessment and treatment plan with the patient. The patient was provided an opportunity to ask questions and all were answered. The patient agreed with the plan and demonstrated an understanding of the instructions.   The patient was advised to call back or seek an in-person evaluation if the symptoms worsen or if the condition fails to improve as anticipated.  I provided 20 minutes of non-face-to-face time during this encounter.   Oletta Darter, MD

## 2020-03-16 DIAGNOSIS — E039 Hypothyroidism, unspecified: Secondary | ICD-10-CM | POA: Diagnosis not present

## 2020-03-16 DIAGNOSIS — E1165 Type 2 diabetes mellitus with hyperglycemia: Secondary | ICD-10-CM | POA: Diagnosis not present

## 2020-03-16 DIAGNOSIS — I10 Essential (primary) hypertension: Secondary | ICD-10-CM | POA: Diagnosis not present

## 2020-03-16 DIAGNOSIS — E785 Hyperlipidemia, unspecified: Secondary | ICD-10-CM | POA: Diagnosis not present

## 2020-03-16 DIAGNOSIS — R69 Illness, unspecified: Secondary | ICD-10-CM | POA: Diagnosis not present

## 2020-03-16 DIAGNOSIS — M1990 Unspecified osteoarthritis, unspecified site: Secondary | ICD-10-CM | POA: Diagnosis not present

## 2020-03-16 DIAGNOSIS — E7800 Pure hypercholesterolemia, unspecified: Secondary | ICD-10-CM | POA: Diagnosis not present

## 2020-03-16 DIAGNOSIS — M159 Polyosteoarthritis, unspecified: Secondary | ICD-10-CM | POA: Diagnosis not present

## 2020-03-16 DIAGNOSIS — G4700 Insomnia, unspecified: Secondary | ICD-10-CM | POA: Diagnosis not present

## 2020-03-24 ENCOUNTER — Ambulatory Visit (INDEPENDENT_AMBULATORY_CARE_PROVIDER_SITE_OTHER): Payer: Medicare HMO | Admitting: Podiatry

## 2020-03-24 ENCOUNTER — Encounter (HOSPITAL_COMMUNITY): Payer: Self-pay | Admitting: Licensed Clinical Social Worker

## 2020-03-24 ENCOUNTER — Ambulatory Visit (INDEPENDENT_AMBULATORY_CARE_PROVIDER_SITE_OTHER): Payer: Medicare HMO | Admitting: Licensed Clinical Social Worker

## 2020-03-24 ENCOUNTER — Ambulatory Visit (INDEPENDENT_AMBULATORY_CARE_PROVIDER_SITE_OTHER): Payer: Medicare HMO

## 2020-03-24 ENCOUNTER — Encounter: Payer: Self-pay | Admitting: Podiatry

## 2020-03-24 ENCOUNTER — Other Ambulatory Visit: Payer: Self-pay

## 2020-03-24 DIAGNOSIS — M2042 Other hammer toe(s) (acquired), left foot: Secondary | ICD-10-CM

## 2020-03-24 DIAGNOSIS — F331 Major depressive disorder, recurrent, moderate: Secondary | ICD-10-CM

## 2020-03-24 DIAGNOSIS — R69 Illness, unspecified: Secondary | ICD-10-CM | POA: Diagnosis not present

## 2020-03-24 DIAGNOSIS — M205X1 Other deformities of toe(s) (acquired), right foot: Secondary | ICD-10-CM | POA: Diagnosis not present

## 2020-03-24 NOTE — Progress Notes (Signed)
Subjective:   Patient ID: [name], female   DOB: 54 y.o.   MRN: [id number]   HPI Patient presents stating I am doing well with my left foot with some swelling around the big toe joint still but overall good and I am ready to get the right foot fixed and would like to review consent form for surgery next month   ROS      Objective:  Physical Exam  Neurovascular status intact negative Bevelyn Buckles' sign noted left foot healing well wound edges well coapted mild edema but normal no crepitus of the joint second toe good alignment with reduced range of motion of the first MPJ right with spur around the first metatarsal head right     Assessment:  Doing well post osteotomy left digital fusion left with hallux limitus rigidus condition right     Plan:  H&P reviewed both conditions.  For the right I have recommended surgical intervention I do think biplanar osteotomy with removal of spur would be best and patient wants this done.  I explained the procedure to patient and reviewed and patient after reviewing signed consent form understanding risk.  Patient is scheduled for outpatient surgery is encouraged to call with questions concerns which may arise and all questions answered patient understanding that just because 1 foot did so well no guarantee the second 1 will  X-rays indicate left the healing is occurring there may be a slight crack line in the osteotomy but it is stable with excellent clinical appearance

## 2020-03-24 NOTE — Progress Notes (Signed)
Virtual Visit via Telephone Note  I connected with [name] on 03/24/20 at  1:30 PM EDT by telephone and verified that I am speaking with the correct person using two identifiers.  Location: Patient: home Provider: office   I discussed the limitations, risks, security and privacy concerns of performing an evaluation and management service by telephone and the availability of in person appointments. I also discussed with the patient that there may be a patient responsible charge related to this service. The patient expressed understanding and agreed to proceed.  Type of Therapy: Individual Therapy   Treatment Goals addressed: Improve Psychiatric Symptoms, Elevate Mood, Improve Unhelpful Thought Patterns, decrease anxiety, learn about diagnosis, healthy coping skills   Interventions: Motivational Interviewing   Summary: [name] is a 54 y.o. female who presents with Major Depressive Disorder, Recurrent moderate, and Generalized Anxiety Disorder   Suicidal/Homicidal: No -without intent/plan   Therapist Response:   Meria met with clinician for an individual session. [name] discussed her psychiatric symptoms and her current life events. [name] shared that she has been depressed due to the 3 year anniversary of her mother's death. Clinician utilized MI OARS to process thoughts, feelings, and behaviors during this period of grief. Clinician discussed the role of grief in daily life, as well as in more intense times, such as this. Clinician explored outcomes of interaction attempts with bio-father. Clinician identified that the pain of knowing he is alive and will not make contact with her is worse than when she didn't know who or where he was. [name] noted loneliness as a pervasive emotion. 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 Clinician also offered suggestions of doing a group through [facility].    Plan: Return again in 1-2  months   Diagnosis: Axis I: Major Depressive Disorder, Recurrent Mild, and Generalized Anxiety Disorder             I discussed the assessment and treatment plan with the patient. The patient was provided an opportunity to ask questions and all were answered. The patient agreed with the plan and demonstrated an understanding of the instructions.   The patient was advised to call back or seek an in-person evaluation if the symptoms worsen or if the condition fails to improve as anticipated.  I provided 45 minutes of non-face-to-face time during this encounter.    R , LCSW  

## 2020-03-25 ENCOUNTER — Telehealth: Payer: Self-pay

## 2020-03-25 NOTE — Telephone Encounter (Signed)
DOS 03/30/2020  [facility] [facility] RT - 35009  [employer] MEDICARE EFFECTIVE DATE - 07/17/2017  PLAN DEDUCTIBLE - $0.00 OUT OF POCKET - $5000.00 W/ $3818.29 REMAINING COPAY $200.00 COINSURANCE - 100%  PER AUTOMATED SYSTEM NO PRECERT IS REQUIRED FOR CPT 28296.  CALL REF# [id number]

## 2020-03-26 DIAGNOSIS — R69 Illness, unspecified: Secondary | ICD-10-CM | POA: Diagnosis not present

## 2020-03-29 MED ORDER — ONDANSETRON HCL 4 MG PO TABS: 4.0000 mg | ORAL_TABLET | Freq: Three times a day (TID) | ORAL | 0 refills | Status: DC | PRN

## 2020-03-29 MED ORDER — OXYCODONE-ACETAMINOPHEN 10-325 MG PO TABS: 1.0000 | ORAL_TABLET | ORAL | 0 refills | Status: DC | PRN

## 2020-03-29 NOTE — Addendum Note (Signed)
Addended by: Wallene Huh on: 03/29/2020 02:44 PM   Modules accepted: Orders

## 2020-03-30 ENCOUNTER — Encounter: Payer: Self-pay | Admitting: Podiatry

## 2020-03-30 DIAGNOSIS — M2011 Hallux valgus (acquired), right foot: Secondary | ICD-10-CM | POA: Diagnosis not present

## 2020-03-30 DIAGNOSIS — I10 Essential (primary) hypertension: Secondary | ICD-10-CM | POA: Diagnosis not present

## 2020-03-30 DIAGNOSIS — M2021 Hallux rigidus, right foot: Secondary | ICD-10-CM | POA: Diagnosis not present

## 2020-04-05 ENCOUNTER — Encounter: Payer: Medicare HMO | Admitting: Podiatry

## 2020-04-05 DIAGNOSIS — G4700 Insomnia, unspecified: Secondary | ICD-10-CM | POA: Diagnosis not present

## 2020-04-05 DIAGNOSIS — M62830 Muscle spasm of back: Secondary | ICD-10-CM | POA: Diagnosis not present

## 2020-04-05 DIAGNOSIS — R69 Illness, unspecified: Secondary | ICD-10-CM | POA: Diagnosis not present

## 2020-04-05 DIAGNOSIS — E1169 Type 2 diabetes mellitus with other specified complication: Secondary | ICD-10-CM | POA: Diagnosis not present

## 2020-04-05 DIAGNOSIS — I10 Essential (primary) hypertension: Secondary | ICD-10-CM | POA: Diagnosis not present

## 2020-04-05 DIAGNOSIS — E039 Hypothyroidism, unspecified: Secondary | ICD-10-CM | POA: Diagnosis not present

## 2020-04-05 DIAGNOSIS — R609 Edema, unspecified: Secondary | ICD-10-CM | POA: Diagnosis not present

## 2020-04-05 DIAGNOSIS — N3281 Overactive bladder: Secondary | ICD-10-CM | POA: Diagnosis not present

## 2020-04-05 DIAGNOSIS — M1990 Unspecified osteoarthritis, unspecified site: Secondary | ICD-10-CM | POA: Diagnosis not present

## 2020-04-05 DIAGNOSIS — E7800 Pure hypercholesterolemia, unspecified: Secondary | ICD-10-CM | POA: Diagnosis not present

## 2020-04-06 ENCOUNTER — Telehealth (HOSPITAL_COMMUNITY): Payer: Self-pay | Admitting: *Deleted

## 2020-04-06 DIAGNOSIS — M19032 Primary osteoarthritis, left wrist: Secondary | ICD-10-CM | POA: Diagnosis not present

## 2020-04-06 DIAGNOSIS — M79641 Pain in right hand: Secondary | ICD-10-CM | POA: Diagnosis not present

## 2020-04-06 DIAGNOSIS — M791 Myalgia, unspecified site: Secondary | ICD-10-CM | POA: Diagnosis not present

## 2020-04-06 DIAGNOSIS — E7800 Pure hypercholesterolemia, unspecified: Secondary | ICD-10-CM | POA: Diagnosis not present

## 2020-04-06 DIAGNOSIS — M79643 Pain in unspecified hand: Secondary | ICD-10-CM | POA: Diagnosis not present

## 2020-04-06 DIAGNOSIS — M79642 Pain in left hand: Secondary | ICD-10-CM | POA: Diagnosis not present

## 2020-04-06 DIAGNOSIS — M1812 Unilateral primary osteoarthritis of first carpometacarpal joint, left hand: Secondary | ICD-10-CM | POA: Diagnosis not present

## 2020-04-06 DIAGNOSIS — M159 Polyosteoarthritis, unspecified: Secondary | ICD-10-CM | POA: Diagnosis not present

## 2020-04-06 DIAGNOSIS — M064 Inflammatory polyarthropathy: Secondary | ICD-10-CM | POA: Diagnosis not present

## 2020-04-06 DIAGNOSIS — E039 Hypothyroidism, unspecified: Secondary | ICD-10-CM | POA: Diagnosis not present

## 2020-04-06 DIAGNOSIS — M797 Fibromyalgia: Secondary | ICD-10-CM | POA: Diagnosis not present

## 2020-04-06 DIAGNOSIS — E1165 Type 2 diabetes mellitus with hyperglycemia: Secondary | ICD-10-CM | POA: Diagnosis not present

## 2020-04-06 DIAGNOSIS — M1990 Unspecified osteoarthritis, unspecified site: Secondary | ICD-10-CM | POA: Diagnosis not present

## 2020-04-06 DIAGNOSIS — G4700 Insomnia, unspecified: Secondary | ICD-10-CM | POA: Diagnosis not present

## 2020-04-06 DIAGNOSIS — E785 Hyperlipidemia, unspecified: Secondary | ICD-10-CM | POA: Diagnosis not present

## 2020-04-06 DIAGNOSIS — M19041 Primary osteoarthritis, right hand: Secondary | ICD-10-CM | POA: Diagnosis not present

## 2020-04-06 DIAGNOSIS — I10 Essential (primary) hypertension: Secondary | ICD-10-CM | POA: Diagnosis not present

## 2020-04-06 DIAGNOSIS — M2550 Pain in unspecified joint: Secondary | ICD-10-CM | POA: Diagnosis not present

## 2020-04-06 DIAGNOSIS — M19042 Primary osteoarthritis, left hand: Secondary | ICD-10-CM | POA: Diagnosis not present

## 2020-04-06 DIAGNOSIS — M79671 Pain in right foot: Secondary | ICD-10-CM | POA: Diagnosis not present

## 2020-04-06 DIAGNOSIS — R69 Illness, unspecified: Secondary | ICD-10-CM | POA: Diagnosis not present

## 2020-04-06 DIAGNOSIS — E669 Obesity, unspecified: Secondary | ICD-10-CM | POA: Diagnosis not present

## 2020-04-06 DIAGNOSIS — M1811 Unilateral primary osteoarthritis of first carpometacarpal joint, right hand: Secondary | ICD-10-CM | POA: Diagnosis not present

## 2020-04-06 NOTE — Telephone Encounter (Signed)
Pt called c/o "tremors" which she states she's had previously on Lthium and that her dose was recently increased. Pt saw PCP on 04/05/20, Antony Contras, and he told pt to call this office. Last lithium level was resulted in May and it was subtherapeutic at that time. Pt takes 450mg  QHS since May increased from 300mg . Pt states that she is taking as ordered. Please review and advise.

## 2020-04-08 ENCOUNTER — Telehealth (HOSPITAL_COMMUNITY): Payer: Medicare HMO | Admitting: Psychiatry

## 2020-04-08 ENCOUNTER — Other Ambulatory Visit: Payer: Self-pay

## 2020-04-08 DIAGNOSIS — F5105 Insomnia due to other mental disorder: Secondary | ICD-10-CM

## 2020-04-08 DIAGNOSIS — F411 Generalized anxiety disorder: Secondary | ICD-10-CM

## 2020-04-08 DIAGNOSIS — F332 Major depressive disorder, recurrent severe without psychotic features: Secondary | ICD-10-CM

## 2020-04-08 NOTE — Progress Notes (Unsigned)
Tremors worse started 6 weeks ago. Went to pcp who thought it was due lithium level  Depression ok. Most bothered by being unable to work. Wants to feel needed. Denies sib/SI/hi. Sleep 6 hrs.   Anxiety is high sure to finances. Husband not working due to health issues   D/c lithium due to tremors Continue therapy Oct 21 @ 10.15a (10 min) Refill meds

## 2020-04-09 DIAGNOSIS — G4733 Obstructive sleep apnea (adult) (pediatric): Secondary | ICD-10-CM | POA: Diagnosis not present

## 2020-04-09 MED ORDER — ESZOPICLONE 3 MG PO TABS: 3.0000 mg | ORAL_TABLET | Freq: Every evening | ORAL | 2 refills | Status: DC | PRN

## 2020-04-09 MED ORDER — MIRTAZAPINE 30 MG PO TABS: 60.0000 mg | ORAL_TABLET | Freq: Every day | ORAL | 2 refills | Status: DC

## 2020-04-09 MED ORDER — DULOXETINE HCL 60 MG PO CPEP: 60.0000 mg | ORAL_CAPSULE | Freq: Two times a day (BID) | ORAL | 2 refills | Status: DC

## 2020-04-09 MED ORDER — LORAZEPAM 1 MG PO TABS: 1.0000 mg | ORAL_TABLET | Freq: Two times a day (BID) | ORAL | 2 refills | Status: DC

## 2020-04-12 ENCOUNTER — Encounter: Payer: Self-pay | Admitting: Podiatry

## 2020-04-12 ENCOUNTER — Ambulatory Visit (INDEPENDENT_AMBULATORY_CARE_PROVIDER_SITE_OTHER): Payer: Medicare HMO | Admitting: Podiatry

## 2020-04-12 ENCOUNTER — Other Ambulatory Visit: Payer: Self-pay

## 2020-04-12 ENCOUNTER — Ambulatory Visit: Payer: Self-pay

## 2020-04-12 ENCOUNTER — Ambulatory Visit (INDEPENDENT_AMBULATORY_CARE_PROVIDER_SITE_OTHER): Payer: Medicare HMO

## 2020-04-12 DIAGNOSIS — R52 Pain, unspecified: Secondary | ICD-10-CM

## 2020-04-12 DIAGNOSIS — M2042 Other hammer toe(s) (acquired), left foot: Secondary | ICD-10-CM

## 2020-04-12 NOTE — Progress Notes (Signed)
Subjective:   Patient ID: [name], female   DOB: 54 y.o.   MRN: [id number]   HPI Patient presents stating doing really well with surgery currently   ROS      Objective:  Physical Exam  Neurovascular status intact negative Bevelyn Buckles' sign noted right foot healing well wound edges well coapted moderate edema around the first MPJ good range of motion with no crepitus of the joint     Assessment:  Overall doing well post osteotomy first metatarsal right     Plan:  H&P reviewed condition and recommended continued range of motion exercises reapplied sterile dressing advised on continued elevation compression immobilization and reappoint 3 weeks or earlier if needed  X-rays indicate that there is no spur formation currently the osteotomy looks good fixation in place no indication of movement

## 2020-04-21 ENCOUNTER — Encounter (HOSPITAL_COMMUNITY): Payer: Self-pay | Admitting: Licensed Clinical Social Worker

## 2020-04-21 ENCOUNTER — Other Ambulatory Visit: Payer: Self-pay

## 2020-04-21 ENCOUNTER — Ambulatory Visit (INDEPENDENT_AMBULATORY_CARE_PROVIDER_SITE_OTHER): Payer: Medicare HMO | Admitting: Licensed Clinical Social Worker

## 2020-04-21 DIAGNOSIS — R69 Illness, unspecified: Secondary | ICD-10-CM | POA: Diagnosis not present

## 2020-04-21 DIAGNOSIS — F411 Generalized anxiety disorder: Secondary | ICD-10-CM | POA: Diagnosis not present

## 2020-04-21 DIAGNOSIS — F331 Major depressive disorder, recurrent, moderate: Secondary | ICD-10-CM

## 2020-04-21 NOTE — Progress Notes (Signed)
Virtual Visit via Telephone Note  I connected with [name] on 04/21/20 at  2:30 PM EDT by telephone and verified that I am speaking with the correct person using two identifiers.  Location: Patient: home Provider: office   I discussed the limitations, risks, security and privacy concerns of performing an evaluation and management service by telephone and the availability of in person appointments. I also discussed with the patient that there may be a patient responsible charge related to this service. The patient expressed understanding and agreed to proceed.  Type of Therapy: Individual Therapy   Treatment Goals addressed: Improve Psychiatric Symptoms, Elevate Mood, Improve Unhelpful Thought Patterns, decrease anxiety, learn about diagnosis, healthy coping skills   Interventions: Motivational Interviewing   Summary: [name] is a 54 y.o. female who presents with Major Depressive Disorder, Recurrent moderate, and Generalized Anxiety Disorder   Suicidal/Homicidal: No -without intent/plan   Therapist Response:   [name] met with clinician for an individual session. Paislei discussed her psychiatric symptoms and her current life events. [name] shared that she has been doing okay, but continues to feel low mood. [name] reported that she is off the Lithium, which has helped her reduce some of her tremors, but she feels less motivated and more irritable. Clinician discussed interactions at home and in her family. Clinician utilized MI OARS to reflect and summarize [name]'s sense of both helplessness in some areas of her life, and need for control in others. Clinician challenged [name]'s reality testing with her ability to change other people and their attitudes with stubbornness. Clinician encouraged [name] to be a little more flexible in some areas that are bothersome. Cedric reports she has started taking a course on Medical Billing. Clinician applauded this step, as a way to get back to  work and to help fulfill herself.    Plan: Return again in 1-2 months   Diagnosis: Axis I: Major Depressive Disorder, Recurrent Mild, and Generalized Anxiety Disorder       I discussed the assessment and treatment plan with the patient. The patient was provided an opportunity to ask questions and all were answered. The patient agreed with the plan and demonstrated an understanding of the instructions.   The patient was advised to call back or seek an in-person evaluation if the symptoms worsen or if the condition fails to improve as anticipated.  I provided 45 minutes of non-face-to-face time during this encounter.   [name], LCSW

## 2020-04-26 ENCOUNTER — Other Ambulatory Visit (HOSPITAL_COMMUNITY): Payer: Self-pay | Admitting: Psychiatry

## 2020-04-26 DIAGNOSIS — F332 Major depressive disorder, recurrent severe without psychotic features: Secondary | ICD-10-CM

## 2020-04-27 DIAGNOSIS — M13842 Other specified arthritis, left hand: Secondary | ICD-10-CM | POA: Diagnosis not present

## 2020-04-27 DIAGNOSIS — M13841 Other specified arthritis, right hand: Secondary | ICD-10-CM | POA: Diagnosis not present

## 2020-05-03 ENCOUNTER — Other Ambulatory Visit: Payer: Self-pay

## 2020-05-03 ENCOUNTER — Encounter: Payer: Self-pay | Admitting: Podiatry

## 2020-05-03 ENCOUNTER — Other Ambulatory Visit: Payer: Self-pay | Admitting: Podiatry

## 2020-05-03 ENCOUNTER — Ambulatory Visit (INDEPENDENT_AMBULATORY_CARE_PROVIDER_SITE_OTHER): Payer: Medicare HMO

## 2020-05-03 ENCOUNTER — Ambulatory Visit (INDEPENDENT_AMBULATORY_CARE_PROVIDER_SITE_OTHER): Payer: Medicare HMO | Admitting: Podiatry

## 2020-05-03 DIAGNOSIS — M21611 Bunion of right foot: Secondary | ICD-10-CM

## 2020-05-03 DIAGNOSIS — M2042 Other hammer toe(s) (acquired), left foot: Secondary | ICD-10-CM | POA: Diagnosis not present

## 2020-05-03 NOTE — Progress Notes (Signed)
Subjective:   Patient ID: [name], female   DOB: 54 y.o.   MRN: [id number]   HPI Patient states overall doing well with mild swelling   ROS      Objective:  Physical Exam  Neurovascular status intact negative Bevelyn Buckles' sign noted wound edges well coapted right first MPJ good range of motion no crepitus     Assessment:  Doing well post hallux limitus repair right     Plan:  H&P x-ray reviewed and gradual return to soft shoe gear over the next 2 to 4 weeks recommended with range of motion exercises and reappoint 6 weeks or earlier if needed  X-rays indicate osteotomies healing well fixation in place no indications of movement

## 2020-05-05 DIAGNOSIS — Z6841 Body Mass Index (BMI) 40.0 and over, adult: Secondary | ICD-10-CM | POA: Diagnosis not present

## 2020-05-05 DIAGNOSIS — E039 Hypothyroidism, unspecified: Secondary | ICD-10-CM | POA: Diagnosis not present

## 2020-05-05 DIAGNOSIS — E785 Hyperlipidemia, unspecified: Secondary | ICD-10-CM | POA: Diagnosis not present

## 2020-05-05 DIAGNOSIS — G4733 Obstructive sleep apnea (adult) (pediatric): Secondary | ICD-10-CM | POA: Diagnosis not present

## 2020-05-05 DIAGNOSIS — Z794 Long term (current) use of insulin: Secondary | ICD-10-CM | POA: Diagnosis not present

## 2020-05-05 DIAGNOSIS — F411 Generalized anxiety disorder: Secondary | ICD-10-CM | POA: Diagnosis not present

## 2020-05-05 DIAGNOSIS — E1165 Type 2 diabetes mellitus with hyperglycemia: Secondary | ICD-10-CM | POA: Diagnosis not present

## 2020-05-05 DIAGNOSIS — G8929 Other chronic pain: Secondary | ICD-10-CM | POA: Diagnosis not present

## 2020-05-05 DIAGNOSIS — R69 Illness, unspecified: Secondary | ICD-10-CM | POA: Diagnosis not present

## 2020-05-06 ENCOUNTER — Telehealth (HOSPITAL_COMMUNITY): Payer: Medicare HMO | Admitting: Psychiatry

## 2020-05-06 ENCOUNTER — Other Ambulatory Visit: Payer: Self-pay

## 2020-05-06 DIAGNOSIS — F411 Generalized anxiety disorder: Secondary | ICD-10-CM

## 2020-05-06 DIAGNOSIS — E1169 Type 2 diabetes mellitus with other specified complication: Secondary | ICD-10-CM | POA: Diagnosis not present

## 2020-05-06 DIAGNOSIS — E785 Hyperlipidemia, unspecified: Secondary | ICD-10-CM | POA: Diagnosis not present

## 2020-05-06 DIAGNOSIS — F5105 Insomnia due to other mental disorder: Secondary | ICD-10-CM

## 2020-05-06 DIAGNOSIS — F332 Major depressive disorder, recurrent severe without psychotic features: Secondary | ICD-10-CM

## 2020-05-06 DIAGNOSIS — G4700 Insomnia, unspecified: Secondary | ICD-10-CM | POA: Diagnosis not present

## 2020-05-06 DIAGNOSIS — I10 Essential (primary) hypertension: Secondary | ICD-10-CM | POA: Diagnosis not present

## 2020-05-06 MED ORDER — DULOXETINE HCL 60 MG PO CPEP: 60.0000 mg | ORAL_CAPSULE | Freq: Two times a day (BID) | ORAL | 2 refills | Status: DC

## 2020-05-06 MED ORDER — MIRTAZAPINE 30 MG PO TABS: 60.0000 mg | ORAL_TABLET | Freq: Every day | ORAL | 2 refills | Status: DC

## 2020-05-06 MED ORDER — ZOLPIDEM TARTRATE 5 MG PO TABS: ORAL_TABLET | ORAL | 2 refills | Status: DC

## 2020-05-06 MED ORDER — LORAZEPAM 1 MG PO TABS: 1.0000 mg | ORAL_TABLET | Freq: Two times a day (BID) | ORAL | 2 refills | Status: DC

## 2020-05-06 NOTE — Progress Notes (Signed)
Virtual Visit via Telephone Note  I connected with [name] on 05/06/20 at  8:15 AM EDT by telephone and verified that I am speaking with the correct person using two identifiers.  Location: Patient: home Provider: office   I discussed the limitations, risks, security and privacy concerns of performing an evaluation and management service by telephone and the availability of in person appointments. I also discussed with the patient that there may be a patient responsible charge related to this service. The patient expressed understanding and agreed to proceed.   History of Present Illness: "I have been struggling quiet a bit lately". She is not sleeping well and only getting about 5 hrs/night. [name] is not sure if it is because she is trying to find her biological father. She is debating whether she should try to drive to [facility] to meet him. She had both her feet surgery recently. On Monday she has thumb surgery and later this year she will have the other thumb operated on. [name] is depressed. She is feeling unmotivated, sad/unhappy, in pain and experiencing anhedonia. She is tired but is unable to nap. [name] is still having tremors in both hands when she is trying to do things. Her heart is always racing. She is experiencing racing thoughts and is unable to relax. [name] is taking online classes and is doing well.  In the next couple of months there are a number of anniversaries of deaths coming up.    Observations/Objective:  General Appearance: unable to assess  Eye Contact:  unable to assess  Speech:  Clear and Coherent and Normal Rate  Volume:  Normal  Mood:  Anxious and Depressed  Affect:  Congruent  Thought Process:  Goal Directed, Linear, and Descriptions of Associations: Intact  Orientation:  Full (Time, Place, and Person)  Thought Content:  Logical  Suicidal Thoughts:  No  Homicidal Thoughts:  No  Memory:  Immediate;   Good  Judgement:  Good  Insight:  Good  Psychomotor  Activity: unable to assess  Concentration:  Concentration: Good  Recall:  Good  Fund of Knowledge:  Good  Language:  Good  Akathisia:  unable to assess  Handed:  Right  AIMS (if indicated):     Assets:  Communication Skills Desire for Improvement Financial Resources/Insurance Housing Resilience Social Support Talents/Skills Transportation Vocational/Educational  ADL's:  unable to assess  Cognition:  WNL  Sleep:         Assessment and Plan: MDD- recurrent, severe wihtout psychotic features; GAD; Insomnia; Cluster B traits; r/o PTSD  Cymbalta 60mg  po BID Remeron 60mg  po qHS D/c Lunesta  Ativan 1mg  po BID prn anxiety Start trial of Ambien 2.5-5mg  po qHS prn sleep  Encouraged to continue therapy   Follow Up Instructions: In 2-4 weeks or sooner if needed   I discussed the assessment and treatment plan with the patient. The patient was provided an opportunity to ask questions and all were answered. The patient agreed with the plan and demonstrated an understanding of the instructions.   The patient was advised to call back or seek an in-person evaluation if the symptoms worsen or if the condition fails to improve as anticipated.  I provided 15 minutes of non-face-to-face time during this encounter.   Oletta Darter, MD

## 2020-05-07 ENCOUNTER — Other Ambulatory Visit (HOSPITAL_COMMUNITY)
Admission: RE | Admit: 2020-05-07 | Discharge: 2020-05-07 | Disposition: A | Discharge status: Home / Self Care | Payer: Medicare HMO | Source: Ambulatory Visit | Attending: Orthopedic Surgery | Admitting: Orthopedic Surgery

## 2020-05-07 ENCOUNTER — Encounter (HOSPITAL_COMMUNITY): Payer: Self-pay | Admitting: Orthopedic Surgery

## 2020-05-07 DIAGNOSIS — Z01812 Encounter for preprocedural laboratory examination: Secondary | ICD-10-CM | POA: Diagnosis not present

## 2020-05-07 DIAGNOSIS — Z20822 Contact with and (suspected) exposure to covid-19: Secondary | ICD-10-CM | POA: Diagnosis not present

## 2020-05-07 LAB — SARS CORONAVIRUS 2 (TAT 6-24 HRS): SARS Coronavirus 2: NEGATIVE
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
## 2020-05-07 NOTE — Progress Notes (Signed)
Preop call completed with patient.  Patient is diabetic.  Will not take diabetes meds DOS.  OSA with CPAP.  ERAS protocol till 0950 DOS. Denies chest pain, shortness of breath or any cardiac symptoms.

## 2020-05-09 DIAGNOSIS — G4733 Obstructive sleep apnea (adult) (pediatric): Secondary | ICD-10-CM | POA: Diagnosis not present

## 2020-05-10 ENCOUNTER — Ambulatory Visit (HOSPITAL_COMMUNITY): Payer: Medicare HMO | Admitting: Certified Registered"

## 2020-05-10 ENCOUNTER — Other Ambulatory Visit: Payer: Self-pay

## 2020-05-10 ENCOUNTER — Encounter (HOSPITAL_COMMUNITY): Payer: Self-pay | Admitting: Orthopedic Surgery

## 2020-05-10 ENCOUNTER — Encounter (HOSPITAL_COMMUNITY)
Admission: RE | Disposition: A | Discharge status: Home / Self Care | Payer: Self-pay | Source: Ambulatory Visit | Attending: Orthopedic Surgery

## 2020-05-10 ENCOUNTER — Ambulatory Visit (HOSPITAL_COMMUNITY)
Admission: RE | Admit: 2020-05-10 | Discharge: 2020-05-10 | Disposition: A | Discharge status: Home / Self Care | Payer: Medicare HMO | Source: Ambulatory Visit | Attending: Orthopedic Surgery | Admitting: Orthopedic Surgery

## 2020-05-10 DIAGNOSIS — G8918 Other acute postprocedural pain: Secondary | ICD-10-CM | POA: Diagnosis not present

## 2020-05-10 DIAGNOSIS — M19041 Primary osteoarthritis, right hand: Secondary | ICD-10-CM | POA: Diagnosis not present

## 2020-05-10 DIAGNOSIS — Z96653 Presence of artificial knee joint, bilateral: Secondary | ICD-10-CM | POA: Insufficient documentation

## 2020-05-10 DIAGNOSIS — Z6841 Body Mass Index (BMI) 40.0 and over, adult: Secondary | ICD-10-CM | POA: Diagnosis not present

## 2020-05-10 DIAGNOSIS — I10 Essential (primary) hypertension: Secondary | ICD-10-CM | POA: Insufficient documentation

## 2020-05-10 DIAGNOSIS — G4730 Sleep apnea, unspecified: Secondary | ICD-10-CM | POA: Diagnosis not present

## 2020-05-10 DIAGNOSIS — M180 Bilateral primary osteoarthritis of first carpometacarpal joints: Secondary | ICD-10-CM | POA: Diagnosis not present

## 2020-05-10 DIAGNOSIS — M19042 Primary osteoarthritis, left hand: Secondary | ICD-10-CM | POA: Insufficient documentation

## 2020-05-10 DIAGNOSIS — E1136 Type 2 diabetes mellitus with diabetic cataract: Secondary | ICD-10-CM | POA: Diagnosis not present

## 2020-05-10 DIAGNOSIS — M1812 Unilateral primary osteoarthritis of first carpometacarpal joint, left hand: Secondary | ICD-10-CM

## 2020-05-10 DIAGNOSIS — Z794 Long term (current) use of insulin: Secondary | ICD-10-CM | POA: Insufficient documentation

## 2020-05-10 DIAGNOSIS — K219 Gastro-esophageal reflux disease without esophagitis: Secondary | ICD-10-CM | POA: Diagnosis not present

## 2020-05-10 DIAGNOSIS — E039 Hypothyroidism, unspecified: Secondary | ICD-10-CM | POA: Diagnosis not present

## 2020-05-10 DIAGNOSIS — M13842 Other specified arthritis, left hand: Secondary | ICD-10-CM | POA: Diagnosis not present

## 2020-05-10 DIAGNOSIS — M13841 Other specified arthritis, right hand: Secondary | ICD-10-CM | POA: Diagnosis not present

## 2020-05-10 HISTORY — PX: FINGER ARTHROPLASTY: SHX5017

## 2020-05-10 LAB — BASIC METABOLIC PANEL
Anion gap: 11 (ref 5–15)
BUN: 13 mg/dL (ref 6–20)
CO2: 26 mmol/L (ref 22–32)
Calcium: 9.2 mg/dL (ref 8.9–10.3)
Chloride: 101 mmol/L (ref 98–111)
Creatinine, Ser: 0.82 mg/dL (ref 0.44–1.00)
GFR, Estimated: >60 mL/min (ref >60)
Glucose, Bld: 113 mg/dL — ABNORMAL HIGH (ref 70–99)
Potassium: 3.9 mmol/L (ref 3.5–5.1)
Sodium: 138 mmol/L (ref 135–145)

## 2020-05-10 LAB — GLUCOSE, CAPILLARY
Glucose-Capillary: 102 mg/dL — ABNORMAL HIGH (ref 70–99)
Glucose-Capillary: 102 mg/dL — ABNORMAL HIGH (ref 70–99)

## 2020-05-10 LAB — CBC
HCT: 40 % (ref 36.0–46.0)
Hemoglobin: 12.7 g/dL (ref 12.0–15.0)
MCH: 29.7 pg (ref 26.0–34.0)
MCHC: 31.8 g/dL (ref 30.0–36.0)
MCV: 93.5 fL (ref 80.0–100.0)
Platelets: 232 10*3/uL (ref 150–400)
RBC: 4.28 MIL/uL (ref 3.87–5.11)
RDW: 13.8 % (ref 11.5–15.5)
WBC: 7.3 10*3/uL (ref 4.0–10.5)
nRBC: 0 % (ref 0.0–0.2)

## 2020-05-10 LAB — POCT PREGNANCY, URINE: Preg Test, Ur: NEGATIVE

## 2020-05-10 SURGERY — ARTHROPLASTY, FINGER
Anesthesia: Regional | Site: Finger | Laterality: Left

## 2020-05-10 MED ORDER — MIDAZOLAM HCL 2 MG/2ML IJ SOLN: 2.0000 mg | Freq: Once | INTRAMUSCULAR | Status: AC

## 2020-05-10 MED ORDER — HEMOSTATIC AGENTS (NO CHARGE) OPTIME
TOPICAL | Status: DC | PRN
  Administered 2020-05-10: 1 via TOPICAL

## 2020-05-10 MED ORDER — FENTANYL CITRATE (PF) 250 MCG/5ML IJ SOLN
INTRAMUSCULAR | Status: AC
  Filled 2020-05-10: qty 5

## 2020-05-10 MED ORDER — DEXMEDETOMIDINE (PRECEDEX) IN NS 20 MCG/5ML (4 MCG/ML) IV SYRINGE
PREFILLED_SYRINGE | INTRAVENOUS | Status: DC | PRN
  Administered 2020-05-10: 8 ug via INTRAVENOUS
  Administered 2020-05-10 (×2): 12 ug via INTRAVENOUS

## 2020-05-10 MED ORDER — FENTANYL CITRATE (PF) 100 MCG/2ML IJ SOLN
INTRAMUSCULAR | Status: DC | PRN
  Administered 2020-05-10 (×5): 50 ug via INTRAVENOUS

## 2020-05-10 MED ORDER — LACTATED RINGERS IV SOLN: INTRAVENOUS | Status: DC

## 2020-05-10 MED ORDER — ONDANSETRON HCL 4 MG/2ML IJ SOLN
INTRAMUSCULAR | Status: AC
  Filled 2020-05-10: qty 2

## 2020-05-10 MED ORDER — ONDANSETRON HCL 4 MG/2ML IJ SOLN
INTRAMUSCULAR | Status: DC | PRN
  Administered 2020-05-10: 4 mg via INTRAVENOUS

## 2020-05-10 MED ORDER — GLYCOPYRROLATE PF 0.2 MG/ML IJ SOSY
PREFILLED_SYRINGE | INTRAMUSCULAR | Status: AC
  Filled 2020-05-10: qty 1

## 2020-05-10 MED ORDER — HYDROMORPHONE HCL 1 MG/ML IJ SOLN
0.5000 mg | INTRAMUSCULAR | Status: DC | PRN
  Administered 2020-05-10: 0.5 mg via INTRAVENOUS

## 2020-05-10 MED ORDER — FENTANYL CITRATE (PF) 100 MCG/2ML IJ SOLN
INTRAMUSCULAR | Status: AC
  Administered 2020-05-10: 100 ug via INTRAVENOUS
  Filled 2020-05-10: qty 2

## 2020-05-10 MED ORDER — MEPERIDINE HCL 25 MG/ML IJ SOLN: 6.2500 mg | INTRAMUSCULAR | Status: DC | PRN

## 2020-05-10 MED ORDER — DEXMEDETOMIDINE (PRECEDEX) IN NS 20 MCG/5ML (4 MCG/ML) IV SYRINGE
PREFILLED_SYRINGE | INTRAVENOUS | Status: AC
  Filled 2020-05-10: qty 5

## 2020-05-10 MED ORDER — PROPOFOL 10 MG/ML IV BOLUS
INTRAVENOUS | Status: AC
  Filled 2020-05-10: qty 20

## 2020-05-10 MED ORDER — HYDROMORPHONE HCL 1 MG/ML IJ SOLN
INTRAMUSCULAR | Status: AC
  Administered 2020-05-10: 0.5 mg via INTRAVENOUS
  Filled 2020-05-10: qty 1

## 2020-05-10 MED ORDER — PROPOFOL 500 MG/50ML IV EMUL: INTRAVENOUS | Status: DC | PRN

## 2020-05-10 MED ORDER — SODIUM CHLORIDE 0.9 % IR SOLN
Status: DC | PRN
  Administered 2020-05-10: 1000 mL

## 2020-05-10 MED ORDER — BUPIVACAINE-EPINEPHRINE (PF) 0.5% -1:200000 IJ SOLN
INTRAMUSCULAR | Status: DC | PRN
  Administered 2020-05-10: 30 mL via PERINEURAL

## 2020-05-10 MED ORDER — MIDAZOLAM HCL 5 MG/5ML IJ SOLN
INTRAMUSCULAR | Status: DC | PRN
  Administered 2020-05-10: 2 mg via INTRAVENOUS

## 2020-05-10 MED ORDER — BUPIVACAINE HCL (PF) 0.25 % IJ SOLN
INTRAMUSCULAR | Status: AC
  Filled 2020-05-10: qty 30

## 2020-05-10 MED ORDER — DEXAMETHASONE SODIUM PHOSPHATE 10 MG/ML IJ SOLN
INTRAMUSCULAR | Status: AC
  Filled 2020-05-10: qty 1

## 2020-05-10 MED ORDER — BUPIVACAINE HCL (PF) 0.25 % IJ SOLN
INTRAMUSCULAR | Status: DC | PRN
  Administered 2020-05-10: 30 mL
  Administered 2020-05-10: 10 mL

## 2020-05-10 MED ORDER — GLYCOPYRROLATE 0.2 MG/ML IJ SOLN
INTRAMUSCULAR | Status: DC | PRN
  Administered 2020-05-10: .2 mg via INTRAVENOUS

## 2020-05-10 MED ORDER — MIDAZOLAM HCL 2 MG/2ML IJ SOLN
INTRAMUSCULAR | Status: AC
  Administered 2020-05-10: 2 mg via INTRAVENOUS
  Filled 2020-05-10: qty 2

## 2020-05-10 MED ORDER — CHLORHEXIDINE GLUCONATE 0.12 % MT SOLN
15.0000 mL | Freq: Once | OROMUCOSAL | Status: AC
  Administered 2020-05-10: 15 mL via OROMUCOSAL
  Filled 2020-05-10: qty 15

## 2020-05-10 MED ORDER — MIDAZOLAM HCL 2 MG/2ML IJ SOLN
INTRAMUSCULAR | Status: AC
  Filled 2020-05-10: qty 2

## 2020-05-10 MED ORDER — PROPOFOL 10 MG/ML IV BOLUS
INTRAVENOUS | Status: DC | PRN
  Administered 2020-05-10: 30 mg via INTRAVENOUS
  Administered 2020-05-10: 100 mg via INTRAVENOUS
  Administered 2020-05-10: 20 mg via INTRAVENOUS

## 2020-05-10 MED ORDER — DEXTROSE 5 % IV SOLN
3.0000 g | INTRAVENOUS | Status: AC
  Administered 2020-05-10: 3 g via INTRAVENOUS
  Filled 2020-05-10: qty 3

## 2020-05-10 MED ORDER — PROPOFOL 1000 MG/100ML IV EMUL
INTRAVENOUS | Status: AC
  Filled 2020-05-10: qty 100

## 2020-05-10 MED ORDER — LACTATED RINGERS IV SOLN: INTRAVENOUS | Status: DC | PRN

## 2020-05-10 MED ORDER — ONDANSETRON HCL 4 MG/2ML IJ SOLN: 4.0000 mg | Freq: Once | INTRAMUSCULAR | Status: DC | PRN

## 2020-05-10 MED ORDER — PROPOFOL 500 MG/50ML IV EMUL
INTRAVENOUS | Status: DC | PRN
  Administered 2020-05-10: 100 ug/kg/min via INTRAVENOUS

## 2020-05-10 MED ORDER — BETAMETHASONE SOD PHOS & ACET 6 (3-3) MG/ML IJ SUSP
INTRAMUSCULAR | Status: DC | PRN
  Administered 2020-05-10: 1 mL

## 2020-05-10 MED ORDER — FENTANYL CITRATE (PF) 100 MCG/2ML IJ SOLN: 100.0000 ug | Freq: Once | INTRAMUSCULAR | Status: AC

## 2020-05-10 MED ORDER — ORAL CARE MOUTH RINSE: 15.0000 mL | Freq: Once | OROMUCOSAL | Status: AC

## 2020-05-10 MED ORDER — HYDROMORPHONE HCL 1 MG/ML IJ SOLN
0.2500 mg | INTRAMUSCULAR | Status: DC | PRN
  Administered 2020-05-10 (×2): 0.5 mg via INTRAVENOUS

## 2020-05-10 SURGICAL SUPPLY — 65 items
ANCH SUT SWLK MED 13.5X3.5 (Anchor) ×1 IMPLANT
BLADE SURG 15 STRL LF DISP TIS (BLADE) IMPLANT
BLADE SURG 15 STRL SS (BLADE) ×2
BNDG CMPR 9X4 STRL LF SNTH (GAUZE/BANDAGES/DRESSINGS) ×1
BNDG COHESIVE 3X5 TAN STRL LF (GAUZE/BANDAGES/DRESSINGS) ×2 IMPLANT
BNDG CONFORM 3 STRL LF (GAUZE/BANDAGES/DRESSINGS) ×2 IMPLANT
BNDG ELASTIC 2X5.8 VLCR STR LF (GAUZE/BANDAGES/DRESSINGS) ×1 IMPLANT
BNDG ELASTIC 4X5.8 VLCR STR LF (GAUZE/BANDAGES/DRESSINGS) ×1 IMPLANT
BNDG ESMARK 4X9 LF (GAUZE/BANDAGES/DRESSINGS) ×1 IMPLANT
BNDG GAUZE ELAST 4 BULKY (GAUZE/BANDAGES/DRESSINGS) ×2 IMPLANT
BRUSH SCRUB EZ PLAIN DRY (MISCELLANEOUS) ×2 IMPLANT
BUR GATOR 2.9 (BURR) IMPLANT
COVER SURGICAL LIGHT HANDLE (MISCELLANEOUS) ×2 IMPLANT
COVER WAND RF STERILE (DRAPES) ×2 IMPLANT
CUFF TOURN SGL QUICK 18X4 (TOURNIQUET CUFF) ×2 IMPLANT
CUFF TOURN SGL QUICK 24 (TOURNIQUET CUFF)
CUFF TRNQT CYL 24X4X16.5-23 (TOURNIQUET CUFF) IMPLANT
DECANTER SPIKE VIAL GLASS SM (MISCELLANEOUS) ×1 IMPLANT
DRSG ADAPTIC 3X8 NADH LF (GAUZE/BANDAGES/DRESSINGS) ×2 IMPLANT
GAUZE SPONGE 4X4 12PLY STRL (GAUZE/BANDAGES/DRESSINGS) ×1 IMPLANT
GLOVE BIOGEL PI IND STRL 8.5 (GLOVE) ×1 IMPLANT
GLOVE BIOGEL PI INDICATOR 8.5 (GLOVE) ×1
GLOVE SURG ORTHO 8.0 STRL STRW (GLOVE) ×2 IMPLANT
GOWN STRL REUS W/ TWL LRG LVL3 (GOWN DISPOSABLE) ×2 IMPLANT
GOWN STRL REUS W/ TWL XL LVL3 (GOWN DISPOSABLE) ×1 IMPLANT
GOWN STRL REUS W/TWL LRG LVL3 (GOWN DISPOSABLE) ×4
GOWN STRL REUS W/TWL XL LVL3 (GOWN DISPOSABLE) ×2
IMPL SWIVELOCK 3.5X13.5 (Anchor) ×1 IMPLANT
IMPLANT SWIVELOCK 3.5X13.5 (Anchor) ×2 IMPLANT
KIT ASCP FXDISP 3X8XBTNDS (KITS) IMPLANT
KIT BASIN OR (CUSTOM PROCEDURE TRAY) ×2 IMPLANT
KIT BIO-TENODESIS 3X8 DISP (KITS) ×2
KIT TURNOVER KIT B (KITS) ×2 IMPLANT
MANIFOLD NEPTUNE II (INSTRUMENTS) ×2 IMPLANT
NDL HYPO 21X1.5 SAFETY (NEEDLE) ×1 IMPLANT
NDL HYPO 25GX1X1/2 BEV (NEEDLE) ×1 IMPLANT
NEEDLE HYPO 21X1.5 SAFETY (NEEDLE) ×2 IMPLANT
NEEDLE HYPO 25GX1X1/2 BEV (NEEDLE) ×2 IMPLANT
NS IRRIG 1000ML POUR BTL (IV SOLUTION) ×2 IMPLANT
PACK ORTHO EXTREMITY (CUSTOM PROCEDURE TRAY) ×2 IMPLANT
PAD ARMBOARD 7.5X6 YLW CONV (MISCELLANEOUS) ×4 IMPLANT
PADDING CAST ABS 3INX4YD NS (CAST SUPPLIES) ×1
PADDING CAST ABS COTTON 3X4 (CAST SUPPLIES) IMPLANT
PASSER SUT SWANSON 36MM LOOP (INSTRUMENTS) ×1 IMPLANT
SET CYSTO W/LG BORE CLAMP LF (SET/KITS/TRAYS/PACK) IMPLANT
SET SM JOINT TUBING/CANN (CANNULA) ×2 IMPLANT
SLING ARM FOAM STRAP XLG (SOFTGOODS) ×1 IMPLANT
SOAP 2 % CHG 4 OZ (WOUND CARE) ×2 IMPLANT
SPLINT FIBERGLASS 3X35 (CAST SUPPLIES) ×2 IMPLANT
SPONGE SURGIFOAM ABS GEL SZ50 (HEMOSTASIS) ×1 IMPLANT
STRIP CLOSURE SKIN 1/2X4 (GAUZE/BANDAGES/DRESSINGS) ×1 IMPLANT
SUT ETHILON 4 0 PS 2 18 (SUTURE) IMPLANT
SUT FIBERWIRE 3-0 18 DIAM 3/8 (SUTURE) ×2
SUT FIBERWIRE 4-0 18 TAPR NDL (SUTURE) ×4
SUT MNCRL AB 4-0 PS2 18 (SUTURE) ×1 IMPLANT
SUT VIC AB 2-0 CT1 27 (SUTURE) ×2
SUT VIC AB 2-0 CT1 TAPERPNT 27 (SUTURE) IMPLANT
SUTURE FIBERWR 3-0 18 DIAM 3/8 (SUTURE) IMPLANT
SUTURE FIBERWR 4-0 18 TAPR NDL (SUTURE) IMPLANT
TOWEL GREEN STERILE (TOWEL DISPOSABLE) ×2 IMPLANT
TOWEL GREEN STERILE FF (TOWEL DISPOSABLE) ×2 IMPLANT
TRAP DIGIT (INSTRUMENTS) IMPLANT
TUBE CONNECTING 12X1/4 (SUCTIONS) ×2 IMPLANT
UNDERPAD 30X36 HEAVY ABSORB (UNDERPADS AND DIAPERS) ×2 IMPLANT
WATER STERILE IRR 1000ML POUR (IV SOLUTION) ×2 IMPLANT

## 2020-05-10 NOTE — Transfer of Care (Signed)
Immediate Anesthesia Transfer of Care Note  Patient: [name]  Procedure(s) Performed: LEFT THUMB CARPOMETACARPAL ARTHROPLASTY AND TENDON TRANSFER; INJECTIONOF RIGHT THUMB (Left Finger)  Patient Location: PACU  Anesthesia Type:General and Regional  Level of Consciousness: awake, alert  and oriented  Airway & Oxygen Therapy: Patient Spontanous Breathing and Patient connected to face mask oxygen  Post-op Assessment: Report given to RN and Post -op Vital signs reviewed and stable  Post vital signs: Reviewed and stable  Last Vitals:  Vitals Value Taken Time  BP 142/97 05/10/20 1544  Temp    Pulse 110 05/10/20 1544  Resp 20 05/10/20 1544  SpO2 98 % 05/10/20 1544  Vitals shown include unvalidated device data.  Last Pain:  Vitals:   05/10/20 1145  TempSrc:   PainSc: 4          Complications: No complications documented.

## 2020-05-10 NOTE — Anesthesia Postprocedure Evaluation (Signed)
Anesthesia Post Note  Patient: MILY MALECKI  Procedure(s) Performed: LEFT THUMB CARPOMETACARPAL ARTHROPLASTY AND TENDON TRANSFER; INJECTIONOF RIGHT THUMB (Left Finger)     Patient location during evaluation: PACU Anesthesia Type: Regional Level of consciousness: awake and alert Pain management: pain level controlled Vital Signs Assessment: post-procedure vital signs reviewed and stable Respiratory status: spontaneous breathing, nonlabored ventilation, respiratory function stable and patient connected to nasal cannula oxygen Cardiovascular status: blood pressure returned to baseline and stable Postop Assessment: no apparent nausea or vomiting Anesthetic complications: no   No complications documented.  Last Vitals:  Vitals:   05/10/20 1715 05/10/20 1720  BP: 108/81   Pulse: (!) 101 91  Resp: 12 15  Temp:    SpO2: (!) 85% 90%    Last Pain:  Vitals:   05/10/20 1700  TempSrc:   PainSc: 8                  Esterlene Atiyeh DAVID

## 2020-05-10 NOTE — Anesthesia Preprocedure Evaluation (Addendum)
Anesthesia Evaluation  Patient identified by MRN, date of birth, ID band Patient awake    Reviewed: Allergy & Precautions, NPO status , Patient's Chart, lab work & pertinent test results  Airway Mallampati: I  TM Distance: >3 FB Neck ROM: Full    Dental   Pulmonary    Pulmonary exam normal        Cardiovascular hypertension, Pt. on medications Normal cardiovascular exam     Neuro/Psych Anxiety Depression    GI/Hepatic GERD  Medicated and Controlled,  Endo/Other  diabetes, Type 2, Oral Hypoglycemic Agents, Insulin Dependent  Renal/GU      Musculoskeletal   Abdominal   Peds  Hematology   Anesthesia Other Findings   Reproductive/Obstetrics                             Anesthesia Physical Anesthesia Plan  ASA: III  Anesthesia Plan: Regional   Post-op Pain Management:    Induction: Intravenous  PONV Risk Score and Plan: 2  Airway Management Planned: Nasal Cannula  Additional Equipment:   Intra-op Plan:   Post-operative Plan:   Informed Consent: I have reviewed the patients History and Physical, chart, labs and discussed the procedure including the risks, benefits and alternatives for the proposed anesthesia with the patient or authorized representative who has indicated his/her understanding and acceptance.       Plan Discussed with: CRNA and Surgeon  Anesthesia Plan Comments:         Anesthesia Quick Evaluation

## 2020-05-10 NOTE — Anesthesia Procedure Notes (Signed)
Procedure Name: LMA Insertion Date/Time: 05/10/2020 2:05 PM Performed by: Gayland Curry, CRNA Pre-anesthesia Checklist: Patient identified, Emergency Drugs available, Suction available and Patient being monitored Patient Re-evaluated:Patient Re-evaluated prior to induction Oxygen Delivery Method: Circle system utilized Preoxygenation: Pre-oxygenation with 100% oxygen Induction Type: IV induction LMA: LMA inserted LMA Size: 4.0 Number of attempts: 1 Placement Confirmation: positive ETCO2 and breath sounds checked- equal and bilateral Tube secured with: Tape Dental Injury: Teeth and Oropharynx as per pre-operative assessment

## 2020-05-10 NOTE — Op Note (Signed)
PREOPERATIVE DIAGNOSIS:Left thumb CMC bone on bone arthritis Right thumb CMC bone on bone arthritis  POSTOPERATIVE DIAGNOSIS:Same  ATTENDING SURGEON:Dr. Iran Planas who was scrubbed and present for the entire procedure  ASSISTANT SURGEON: Gertie Fey, PA-C who scrubbed and present for the entire procedure helped aid in exposure arthroplasty closure in a timely fashion  ANESTHESIA: General via LMA with regional block  OPERATIVE PROCEDURE: Left thumb carpometacarpal arthroplasty Left thumb tendon transfer, abductor the flexor carpi radialis and thumb metacarpal Left thumb tendon transfer abductor to the ECRL, suspension plasty Radiographs 3 views left thumb and wrist Right thumb carpometacarpal joint injection  IMPLANTS: Arthrex 3 5 swivel lock  RADIOGRAPHIC INTERPRETATION: AP lateral oblique views of the thumb do shows the [facility] arthroplasty and suspension plasty in good position  SURGICAL INDICATIONS: Patient is a right-hand-dominant female with bone-on-bone longstanding CMC arthritis.  Patient failed nonsurgical treatment.  Patient elected undergo the above procedure.  Risks of surgery include but not limited to bleeding infection damage nearby nerves arteries or tendons loss of motion of the wrist and digits incomplete relief of symptoms and need for further surgical invention.  SURGICAL TECHNIQUE: Patient was palpated find the preoperative holding area marked apart a marker made on left thumb indicate correct operative site.  Patient brought back operating placed supine on anesthesia table where the regional anesthetic was administered.  Preoperative antibiotics were given prior to skin incision.  The general anesthetic was administered.  Attention was then turned to the right thumb.  The area was then prepped with skin prep and solution and the [facility] joint was injected with 1 cc Marcaine 1 cc of Celestone.  Patient tolerated the injection well.  Attention was then turned to the left  thumb.  Well-padded tourniquet was then placed on the left brachium and sealed with appropriate drape.  Left upper extremities and prepped and draped normal sterile fashion.  A timeout was called the correct site was then fine procedure then begun.  Attention then turned to the left thumb CMC joint.  A longitudinal incision made directly over the thumb CMC joint.  Dissection carried down through the skin and subcutaneous tissue.  The interval between the extensor was then incised longitudinally.  The joint capsule was incised longitudinally and direct exposure of the [facility] joint was then carried out.  Circumferential dissection was then carried out around the trapezium.  Once this was carried out the trapezium was adequately protected and using an osteotome and a mallet we were able to remove the trapezium in piecemeal fashion.  After excision of the trapezium careful protection of the FCR was done throughout.  The joint arthroplasty site was then carefully removed with a small remaining bone fragments.  The wound was then thoroughly irrigated.  A counterincision was then made proximally.  Dissection carried down through the skin and subcutaneous tissue and the first dorsal compartments were then carefully identified.  Careful protection of the EPB was done.  2 slips, the ulnar slips of the EPL were then harvested.  These 2 slips were then pulled through the distal incision.  They were carefully protected at the insertion.  They then brought through the joint capsule and set aside for the tendon transfer, the double tendon transfer.  Using the Arthrex swivel lock anchor the guidewire was then placed in the thumb metacarpal.  The 3 5 drill bit was then used to create the bone tunnel for the first tendon transfer.  The first tendon transfer then harvested around the FCR then  back through the thumb metacarpal and sewn back onto itself.  It was anchored with the Arthrex swivel lock anchor creating the adequate tension and  length.  It was sewn back to itself with the FiberWire suture.  Once the first tendon transfer was completed the second tendon was then transferred around the ECRL.  Careful exposure was then done dorsally exposing the ECRL and the tendon was then transferred around the ECRL and weaved back through the original tendon transfer and onto itself creating the suspension plasty.  Final radiographs were then obtained after the arthroplasty and double tendon transfer.  These tendon transfers were reinforced with a 3-0 and 4-0 FiberWire suture.  After completion of this and the radiographs the area was then packed with Gelfoam.  The capsule thick capsular closure was then done with a 4-0 FiberWire suture.  Following this the extensor interval was then closed with 2-0 Vicryl suture.  Both incisions were then closed the subcutaneous tissue with Monocryl in a running 4-0 Prolene sutures.  Steri-Strips were applied.  Sterile compressive bandage then applied.  The patient then placed in a well-padded thumb spica splint taken the recovery room in good condition after being extubated.  Patient tolerated the procedure well.  POSTOPERATIVE PLAN: Patient be discharged to home.  See him back in the office in 2 weeks for wound check suture removal x-rays application of a short arm thumb spica cast.  Placed the therapy order to begin at the 6-week mark.  Radiographs 3 views of the thumb at each visit.

## 2020-05-10 NOTE — Anesthesia Procedure Notes (Signed)
Anesthesia Regional Block: Supraclavicular block   Pre-Anesthetic Checklist: ,, timeout performed, Correct Patient, Correct Site, Correct Laterality, Correct Procedure, Correct Position, site marked, Risks and benefits discussed,  Surgical consent,  Pre-op evaluation,  At surgeon's request and post-op pain management  Laterality: Left  Prep: chloraprep       Needles:   Needle Type: Echogenic Stimulator Needle     Needle Length: 9cm  Needle Gauge: 21     Additional Needles:   Procedures:, nerve stimulator,,,,,,,   Nerve Stimulator or Paresthesia:  Response: 0.4 mA,   Additional Responses:   Narrative:  Start time: 05/10/2020 1:00 PM End time: 05/10/2020 1:15 PM Injection made incrementally with aspirations every 5 mL.  Performed by: Personally  Anesthesiologist: Lillia Abed, MD  Additional Notes: Monitors applied. Patient sedated. Sterile prep and drape,hand hygiene and sterile gloves were used. Relevant anatomy identified.Needle position confirmed.Local anesthetic injected incrementally after negative aspiration. Local anesthetic spread visualized around nerve(s). Vascular puncture avoided. No complications. Image printed for medical record.The patient tolerated the procedure well.

## 2020-05-10 NOTE — Discharge Instructions (Addendum)
KEEP BANDAGE CLEAN AND DRY CALL OFFICE FOR F/U APPT [phone] IN 15 DAYS KEEP HAND ELEVATED ABOVE HEART OK TO APPLY ICE TO OPERATIVE AREA CONTACT OFFICE IF ANY WORSENING PAIN OR CONCERNS.  

## 2020-05-11 ENCOUNTER — Encounter (HOSPITAL_COMMUNITY): Payer: Self-pay | Admitting: Orthopedic Surgery

## 2020-05-13 ENCOUNTER — Other Ambulatory Visit: Payer: Self-pay

## 2020-05-13 ENCOUNTER — Ambulatory Visit (INDEPENDENT_AMBULATORY_CARE_PROVIDER_SITE_OTHER): Payer: Medicare HMO | Admitting: Licensed Clinical Social Worker

## 2020-05-13 ENCOUNTER — Telehealth (HOSPITAL_COMMUNITY): Payer: Medicare HMO | Admitting: Psychiatry

## 2020-05-13 ENCOUNTER — Encounter (HOSPITAL_COMMUNITY): Payer: Self-pay | Admitting: Licensed Clinical Social Worker

## 2020-05-13 DIAGNOSIS — F331 Major depressive disorder, recurrent, moderate: Secondary | ICD-10-CM | POA: Diagnosis not present

## 2020-05-13 DIAGNOSIS — R69 Illness, unspecified: Secondary | ICD-10-CM | POA: Diagnosis not present

## 2020-05-13 NOTE — Progress Notes (Signed)
Virtual Visit via Telephone Note  I connected with [name] on 05/13/20 at  3:30 PM EDT by telephone and verified that I am speaking with the correct person using two identifiers.  Location: Patient: home Provider: office   I discussed the limitations, risks, security and privacy concerns of performing an evaluation and management service by telephone and the availability of in person appointments. I also discussed with the patient that there may be a patient responsible charge related to this service. The patient expressed understanding and agreed to proceed.  Type of Therapy: Individual Therapy   Treatment Goals addressed: Improve Psychiatric Symptoms, Elevate Mood, Improve Unhelpful Thought Patterns, decrease anxiety, learn about diagnosis, healthy coping skills   Interventions: Motivational Interviewing   Summary: Asenath Balash is a 54 y.o. female who presents with Major Depressive Disorder, Recurrent moderate, and Generalized Anxiety Disorder   Suicidal/Homicidal: No -without intent/plan   Therapist Response:   [name] met with clinician for an individual session. Carlton discussed her psychiatric symptoms and her current life events. Jeweline shared that she has been depressed about a lot of things over the past few weeks. Clinician utilized MI OARS to reflect and summarize thoughts and feelings. Clinician explored triggers to current depressive episode. Clinician identified upcoming holidays are always hard, as well as concerns about her relationship with husband, and him not working. Clinician also identified that [facility] had surgery on her hand this week, and while she is not taking pain medication, she is triggered by not having use of her hand. Clinician explored thought changing options, shifting thoughts from pain to gratitude.    Plan: Return again in 1-2 months   Diagnosis: Axis I: Major Depressive Disorder, Recurrent Mild, and Generalized Anxiety Disorder                   I discussed the assessment and treatment plan with the patient. The patient was provided an opportunity to ask questions and all were answered. The patient agreed with the plan and demonstrated an understanding of the instructions.   The patient was advised to call back or seek an in-person evaluation if the symptoms worsen or if the condition fails to improve as anticipated.  I provided 45 minutes of non-face-to-face time during this encounter.   [name], LCSW

## 2020-05-20 DIAGNOSIS — M13842 Other specified arthritis, left hand: Secondary | ICD-10-CM | POA: Diagnosis not present

## 2020-05-20 DIAGNOSIS — Z4789 Encounter for other orthopedic aftercare: Secondary | ICD-10-CM | POA: Diagnosis not present

## 2020-05-26 ENCOUNTER — Other Ambulatory Visit: Payer: Self-pay

## 2020-05-26 ENCOUNTER — Encounter (HOSPITAL_COMMUNITY): Payer: Self-pay | Admitting: Licensed Clinical Social Worker

## 2020-05-26 ENCOUNTER — Ambulatory Visit (INDEPENDENT_AMBULATORY_CARE_PROVIDER_SITE_OTHER): Payer: Medicare HMO | Admitting: Licensed Clinical Social Worker

## 2020-05-26 DIAGNOSIS — F331 Major depressive disorder, recurrent, moderate: Secondary | ICD-10-CM | POA: Diagnosis not present

## 2020-05-26 DIAGNOSIS — F411 Generalized anxiety disorder: Secondary | ICD-10-CM | POA: Diagnosis not present

## 2020-05-26 DIAGNOSIS — R69 Illness, unspecified: Secondary | ICD-10-CM | POA: Diagnosis not present

## 2020-05-26 NOTE — Progress Notes (Signed)
Virtual Visit via Telephone Note  I connected with [name] on 05/26/20 at  3:30 PM EST by telephone and verified that I am speaking with the correct person using two identifiers.  Location: Patient: home Provider: office   I discussed the limitations, risks, security and privacy concerns of performing an evaluation and management service by telephone and the availability of in person appointments. I also discussed with the patient that there may be a patient responsible charge related to this service. The patient expressed understanding and agreed to proceed.  Type of Therapy: Individual Therapy   Treatment Goals addressed: Improve Psychiatric Symptoms, Elevate Mood, Improve Unhelpful Thought Patterns, decrease anxiety, learn about diagnosis, healthy coping skills   Interventions: Motivational Interviewing   Summary: [name] is a 55 y.o. female who presents with Major Depressive Disorder, Recurrent moderate, and Generalized Anxiety Disorder   Suicidal/Homicidal: No -without intent/plan   Therapist Response:   [name] met with clinician for an individual session. [name] discussed her psychiatric symptoms and her current life events. [name] shared that she has been a little better since last session, but still not feeling great. Clinician utilized MI OARS to reflect and summarize thoughts and feelings about stressors in her life. [name] reports ongoing depression from grief over her mother, which "still feels like it happened yesterday". Clinician validated those feelings and identified that the memories are important to keep and the sadness will get a little easier as time goes by. Clinician discussed relationship with husband and noted [name]'s thoughts and feelings, which need to be further examined. Clinician explored options for counseling with husband, but noted that this would not be likely. Clinician discussed self care and noted the urge to take a weekend away. [name]  reported she may do that, as she would like to have a few days to herself.     Plan: Return again in 1-2 months   Diagnosis: Axis I: Major Depressive Disorder, Recurrent Mild, and Generalized Anxiety Disorder                   I discussed the assessment and treatment plan with the patient. The patient was provided an opportunity to ask questions and all were answered. The patient agreed with the plan and demonstrated an understanding of the instructions.   The patient was advised to call back or seek an in-person evaluation if the symptoms worsen or if the condition fails to improve as anticipated.  I provided 45 minutes of non-face-to-face time during this encounter.   [name], LCSW

## 2020-05-27 ENCOUNTER — Telehealth (HOSPITAL_COMMUNITY): Payer: Medicare HMO | Admitting: Psychiatry

## 2020-05-27 ENCOUNTER — Other Ambulatory Visit: Payer: Self-pay

## 2020-05-27 ENCOUNTER — Ambulatory Visit (HOSPITAL_COMMUNITY): Payer: Medicare HMO | Admitting: Licensed Clinical Social Worker

## 2020-05-27 DIAGNOSIS — F99 Mental disorder, not otherwise specified: Secondary | ICD-10-CM

## 2020-05-27 DIAGNOSIS — F5105 Insomnia due to other mental disorder: Secondary | ICD-10-CM

## 2020-05-27 DIAGNOSIS — F331 Major depressive disorder, recurrent, moderate: Secondary | ICD-10-CM

## 2020-05-27 DIAGNOSIS — F411 Generalized anxiety disorder: Secondary | ICD-10-CM

## 2020-05-27 MED ORDER — ESZOPICLONE 3 MG PO TABS: 3.0000 mg | ORAL_TABLET | Freq: Every evening | ORAL | 2 refills | Status: DC | PRN
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
## 2020-06-01 DIAGNOSIS — M13842 Other specified arthritis, left hand: Secondary | ICD-10-CM | POA: Diagnosis not present

## 2020-06-01 DIAGNOSIS — M25532 Pain in left wrist: Secondary | ICD-10-CM | POA: Diagnosis not present

## 2020-06-07 DIAGNOSIS — E039 Hypothyroidism, unspecified: Secondary | ICD-10-CM | POA: Diagnosis not present

## 2020-06-07 DIAGNOSIS — M1990 Unspecified osteoarthritis, unspecified site: Secondary | ICD-10-CM | POA: Diagnosis not present

## 2020-06-07 DIAGNOSIS — E1165 Type 2 diabetes mellitus with hyperglycemia: Secondary | ICD-10-CM | POA: Diagnosis not present

## 2020-06-07 DIAGNOSIS — R69 Illness, unspecified: Secondary | ICD-10-CM | POA: Diagnosis not present

## 2020-06-07 DIAGNOSIS — E7800 Pure hypercholesterolemia, unspecified: Secondary | ICD-10-CM | POA: Diagnosis not present

## 2020-06-07 DIAGNOSIS — G4700 Insomnia, unspecified: Secondary | ICD-10-CM | POA: Diagnosis not present

## 2020-06-07 DIAGNOSIS — E785 Hyperlipidemia, unspecified: Secondary | ICD-10-CM | POA: Diagnosis not present

## 2020-06-07 DIAGNOSIS — M159 Polyosteoarthritis, unspecified: Secondary | ICD-10-CM | POA: Diagnosis not present

## 2020-06-07 DIAGNOSIS — I10 Essential (primary) hypertension: Secondary | ICD-10-CM | POA: Diagnosis not present

## 2020-06-09 DIAGNOSIS — G4733 Obstructive sleep apnea (adult) (pediatric): Secondary | ICD-10-CM | POA: Diagnosis not present

## 2020-06-15 DIAGNOSIS — Z03818 Encounter for observation for suspected exposure to other biological agents ruled out: Secondary | ICD-10-CM | POA: Diagnosis not present

## 2020-06-15 DIAGNOSIS — J029 Acute pharyngitis, unspecified: Secondary | ICD-10-CM | POA: Diagnosis not present

## 2020-06-15 DIAGNOSIS — J069 Acute upper respiratory infection, unspecified: Secondary | ICD-10-CM | POA: Diagnosis not present

## 2020-06-15 DIAGNOSIS — R059 Cough, unspecified: Secondary | ICD-10-CM | POA: Diagnosis not present

## 2020-06-15 DIAGNOSIS — R0981 Nasal congestion: Secondary | ICD-10-CM | POA: Diagnosis not present

## 2020-06-16 ENCOUNTER — Other Ambulatory Visit: Payer: Self-pay

## 2020-06-16 ENCOUNTER — Ambulatory Visit (INDEPENDENT_AMBULATORY_CARE_PROVIDER_SITE_OTHER): Payer: Medicare HMO

## 2020-06-16 ENCOUNTER — Encounter: Payer: Self-pay | Admitting: Podiatry

## 2020-06-16 ENCOUNTER — Ambulatory Visit (INDEPENDENT_AMBULATORY_CARE_PROVIDER_SITE_OTHER): Payer: Medicare HMO | Admitting: Podiatry

## 2020-06-16 DIAGNOSIS — M2042 Other hammer toe(s) (acquired), left foot: Secondary | ICD-10-CM

## 2020-06-16 DIAGNOSIS — M205X1 Other deformities of toe(s) (acquired), right foot: Secondary | ICD-10-CM

## 2020-06-16 NOTE — Progress Notes (Signed)
Subjective:   Patient ID: [name], female   DOB: 54 y.o.   MRN: [id number]   HPI Patient states her feet continue to improve with minimal discomfort with mild swelling still noted with good range of motion no crepitus of the joints   ROS      Objective:  Physical Exam  Neurovascular status intact negative Bevelyn Buckles' sign noted with patient found to have mild swelling around the first MPJ bilateral but good range of motion no crepitus of the joint surfaces     Assessment:  Doing well post osteotomy metatarsal bilateral with some movement of the left but clinically is healing beautifully     Plan:  H&P final x-rays dated and I discussed there is still healing of the left but clinically she is doing great with minimal discomfort so we will just keep an eye on it and I do not see further movement occurring over the last few visits even though I like to see more healing.  Other than that continue what she is doing continue to be careful and gradually increase activity  X-rays indicate there still is some lucency of the dorsal portion of the cut first metatarsal left foot fixation is in place no further movement of the right is healing well

## 2020-06-17 DIAGNOSIS — M13842 Other specified arthritis, left hand: Secondary | ICD-10-CM | POA: Diagnosis not present

## 2020-06-17 DIAGNOSIS — M79645 Pain in left finger(s): Secondary | ICD-10-CM | POA: Diagnosis not present

## 2020-06-22 DIAGNOSIS — M79645 Pain in left finger(s): Secondary | ICD-10-CM | POA: Diagnosis not present

## 2020-06-30 ENCOUNTER — Other Ambulatory Visit: Payer: Self-pay

## 2020-06-30 ENCOUNTER — Ambulatory Visit: Payer: Medicare HMO | Admitting: Family Medicine

## 2020-06-30 ENCOUNTER — Ambulatory Visit (INDEPENDENT_AMBULATORY_CARE_PROVIDER_SITE_OTHER): Payer: Medicare HMO | Admitting: Licensed Clinical Social Worker

## 2020-06-30 ENCOUNTER — Encounter: Payer: Self-pay | Admitting: Family Medicine

## 2020-06-30 VITALS — BP 145/90 | HR 91 | Ht 63.0 in | Wt 291.0 lb

## 2020-06-30 DIAGNOSIS — Z9989 Dependence on other enabling machines and devices: Secondary | ICD-10-CM | POA: Diagnosis not present

## 2020-06-30 DIAGNOSIS — F331 Major depressive disorder, recurrent, moderate: Secondary | ICD-10-CM | POA: Diagnosis not present

## 2020-06-30 DIAGNOSIS — F411 Generalized anxiety disorder: Secondary | ICD-10-CM | POA: Diagnosis not present

## 2020-06-30 DIAGNOSIS — N319 Neuromuscular dysfunction of bladder, unspecified: Secondary | ICD-10-CM | POA: Diagnosis not present

## 2020-06-30 DIAGNOSIS — R69 Illness, unspecified: Secondary | ICD-10-CM | POA: Diagnosis not present

## 2020-06-30 DIAGNOSIS — N3941 Urge incontinence: Secondary | ICD-10-CM | POA: Diagnosis not present

## 2020-06-30 DIAGNOSIS — G4733 Obstructive sleep apnea (adult) (pediatric): Secondary | ICD-10-CM
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
## 2020-06-30 NOTE — Progress Notes (Signed)
I spoke with [name] at [facility]. She stated that the pt needs to call them with her cpap serial number so they can track her usage. Pt was notified and said she will call [facility]. Pt was also notified that should would not be able to et supplies with out having her machine linked to [facility].

## 2020-06-30 NOTE — Patient Instructions (Addendum)
Please continue using your CPAP regularly. While your insurance requires that you use CPAP at least 4 hours each night on 70% of the nights, I recommend, that you not skip any nights and use it throughout the night if you can. Getting used to CPAP and staying with the treatment long term does take time and patience and discipline. Untreated obstructive sleep apnea when it is moderate to severe can have an adverse impact on cardiovascular health and raise her risk for heart disease, arrhythmias, hypertension, congestive heart failure, stroke and diabetes. Untreated obstructive sleep apnea causes sleep disruption, nonrestorative sleep, and sleep deprivation. This can have an impact on your day to day functioning and cause daytime sleepiness and impairment of cognitive function, memory loss, mood disturbance, and problems focussing. Using CPAP regularly can improve these symptoms.   We will anticipate follow up in   Sleep  Months pending review of compliance report.  Apnea Sleep apnea affects breathing during sleep. It causes breathing to stop for a short time or to become shallow. It can also increase the risk of:  Heart attack.  Stroke.  Being very overweight (obese).  Diabetes.  Heart failure.  Irregular heartbeat. The goal of treatment is to help you breathe normally again. What are the causes? There are three kinds of sleep apnea:  Obstructive sleep apnea. This is caused by a blocked or collapsed airway.  Central sleep apnea. This happens when the brain does not send the right signals to the muscles that control breathing.  Mixed sleep apnea. This is a combination of obstructive and central sleep apnea. The most common cause of this condition is a collapsed or blocked airway. This can happen if:  Your throat muscles are too relaxed.  Your tongue and tonsils are too large.  You are overweight.  Your airway is too small. What increases the risk?  Being  overweight.  Smoking.  Having a small airway.  Being older.  Being female.  Drinking alcohol.  Taking medicines to calm yourself (sedatives or tranquilizers).  Having family members with the condition. What are the signs or symptoms?  Trouble staying asleep.  Being sleepy or tired during the day.  Getting angry a lot.  Loud snoring.  Headaches in the morning.  Not being able to focus your mind (concentrate).  Forgetting things.  Less interest in sex.  Mood swings.  Personality changes.  Feelings of sadness (depression).  Waking up a lot during the night to pee (urinate).  Dry mouth.  Sore throat. How is this diagnosed?  Your medical history.  A physical exam.  A test that is done when you are sleeping (sleep study). The test is most often done in a sleep lab but may also be done at home. How is this treated?   Sleeping on your side.  Using a medicine to get rid of mucus in your nose (decongestant).  Avoiding the use of alcohol, medicines to help you relax, or certain pain medicines (narcotics).  Losing weight, if needed.  Changing your diet.  Not smoking.  Using a machine to open your airway while you sleep, such as: ? An oral appliance. This is a mouthpiece that shifts your lower jaw forward. ? A CPAP device. This device blows air through a mask when you breathe out (exhale). ? An EPAP device. This has valves that you put in each nostril. ? A BPAP device. This device blows air through a mask when you breathe in (inhale) and breathe out.  Having  surgery if other treatments do not work. It is important to get treatment for sleep apnea. Without treatment, it can lead to:  High blood pressure.  Coronary artery disease.  In men, not being able to have an erection (impotence).  Reduced thinking ability. Follow these instructions at home: Lifestyle  Make changes that your doctor recommends.  Eat a healthy diet.  Lose weight if  needed.  Avoid alcohol, medicines to help you relax, and some pain medicines.  Do not use any products that contain nicotine or tobacco, such as cigarettes, e-cigarettes, and chewing tobacco. If you need help quitting, ask your doctor. General instructions  Take over-the-counter and prescription medicines only as told by your doctor.  If you were given a machine to use while you sleep, use it only as told by your doctor.  If you are having surgery, make sure to tell your doctor you have sleep apnea. You may need to bring your device with you.  Keep all follow-up visits as told by your doctor. This is important. Contact a doctor if:  The machine that you were given to use during sleep bothers you or does not seem to be working.  You do not get better.  You get worse. Get help right away if:  Your chest hurts.  You have trouble breathing in enough air.  You have an uncomfortable feeling in your back, arms, or stomach.  You have trouble talking.  One side of your body feels weak.  A part of your face is hanging down. These symptoms may be an emergency. Do not wait to see if the symptoms will go away. Get medical help right away. Call your local emergency services (911 in the [place] Do not drive yourself to the hospital. Summary  This condition affects breathing during sleep.  The most common cause is a collapsed or blocked airway.  The goal of treatment is to help you breathe normally while you sleep. This information is not intended to replace advice given to you by your health care provider. Make sure you discuss any questions you have with your health care provider. Document Revised: 04/19/2018 Document Reviewed: 02/26/2018 Elsevier Patient Education  [place].

## 2020-07-01 ENCOUNTER — Other Ambulatory Visit: Payer: Self-pay

## 2020-07-01 ENCOUNTER — Encounter (HOSPITAL_COMMUNITY): Payer: Self-pay | Admitting: Licensed Clinical Social Worker

## 2020-07-01 ENCOUNTER — Telehealth (HOSPITAL_COMMUNITY): Payer: Medicare HMO | Admitting: Psychiatry

## 2020-07-01 DIAGNOSIS — F99 Mental disorder, not otherwise specified: Secondary | ICD-10-CM

## 2020-07-01 DIAGNOSIS — F411 Generalized anxiety disorder: Secondary | ICD-10-CM

## 2020-07-01 DIAGNOSIS — F5105 Insomnia due to other mental disorder: Secondary | ICD-10-CM

## 2020-07-01 DIAGNOSIS — F332 Major depressive disorder, recurrent severe without psychotic features: Secondary | ICD-10-CM

## 2020-07-01 MED ORDER — LORAZEPAM 1 MG PO TABS: 1.0000 mg | ORAL_TABLET | Freq: Two times a day (BID) | ORAL | 2 refills | Status: DC

## 2020-07-01 MED ORDER — DULOXETINE HCL 60 MG PO CPEP: 60.0000 mg | ORAL_CAPSULE | Freq: Two times a day (BID) | ORAL | 2 refills | Status: DC

## 2020-07-01 MED ORDER — ESZOPICLONE 3 MG PO TABS: 3.0000 mg | ORAL_TABLET | Freq: Every evening | ORAL | 2 refills | Status: DC | PRN

## 2020-07-01 MED ORDER — ZOLPIDEM TARTRATE 5 MG PO TABS: ORAL_TABLET | ORAL | 2 refills | Status: DC

## 2020-07-01 MED ORDER — MIRTAZAPINE 30 MG PO TABS: 60.0000 mg | ORAL_TABLET | Freq: Every day | ORAL | 2 refills | Status: DC

## 2020-07-01 NOTE — Progress Notes (Unsigned)
Virtual Visit via Telephone Note  I connected with [name] on 07/01/20 at  3:30 PM EST by telephone and verified that I am speaking with the correct person using two identifiers.  Location: Patient: *** Provider: ***   I discussed the limitations, risks, security and privacy concerns of performing an evaluation and management service by telephone and the availability of in person appointments. I also discussed with the patient that there may be a patient responsible charge related to this service. The patient expressed understanding and agreed to proceed.   History of Present Illness:    Observations/Objective:  General Appearance: unable to assess  Eye Contact:  unable to assess  Speech:  {Speech:22685}  Volume:  {Volume (PAA):22686}  Mood:  {BHH MOOD:22306}  Affect:  {Affect (PAA):22687}  Thought Process:  {Thought Process (PAA):22688}  Orientation:  {BHH ORIENTATION (PAA):22689}  Thought Content:  {Thought Content:22690}  Suicidal Thoughts:  {ST/HT (PAA):22692}  Homicidal Thoughts:  {ST/HT (PAA):22692}  Memory:  {BHH MEMORY:22881}  Judgement:  {Judgement (PAA):22694}  Insight:  {Insight (PAA):22695}  Psychomotor Activity: unable to assess  Concentration:  {Concentration:21399}  Recall:  {BHH GOOD/FAIR/POOR:22877}  Fund of Knowledge:  {BHH GOOD/FAIR/POOR:22877}  Language:  {BHH GOOD/FAIR/POOR:22877}  Akathisia:  unable to assess  Handed:  {Handed:22697}  AIMS (if indicated):     Assets:  {Assets (PAA):22698}  ADL's:  unable to assess  Cognition:  {chl bhh cognition:304700322}  Sleep:         Assessment and Plan:   Follow Up Instructions: In 2-3 months or sooner if needed   I discussed the assessment and treatment plan with the patient. The patient was provided an opportunity to ask questions and all were answered. The patient agreed with the plan and demonstrated an understanding of the instructions.   The patient was advised to call back or seek an  in-person evaluation if the symptoms worsen or if the condition fails to improve as anticipated.  I provided 20 minutes of non-face-to-face time during this encounter.   Charlcie Cradle, MD

## 2020-07-01 NOTE — Progress Notes (Signed)
Virtual Visit via Telephone Note  I connected with [name] on 07/01/20 at 10:00 AM EST by telephone and verified that I am speaking with the correct person using two identifiers.  Location: Patient: home Provider: home office   I discussed the limitations, risks, security and privacy concerns of performing an evaluation and management service by telephone and the availability of in person appointments. I also discussed with the patient that there may be a patient responsible charge related to this service. The patient expressed understanding and agreed to proceed.   Type of Therapy: Individual Therapy   Treatment Goals addressed: Improve Psychiatric Symptoms, Elevate Mood, Improve Unhelpful Thought Patterns, decrease anxiety, learn about diagnosis, healthy coping skills   Interventions: Motivational Interviewing   Summary: [name] is a 54 y.o. female who presents with Major Depressive Disorder, Recurrent moderate, and Generalized Anxiety Disorder   Suicidal/Homicidal: No -without intent/plan   Therapist Response:   [name] met with clinician for an individual session. [name] discussed her psychiatric symptoms and her current life events. [name] shared that she continues to feel down and depressed. Clinician utilized MI OARS to reflect and summarize impact of marital problems on her entire world. Clinician explored thoughts and feelings about what she will do, noting her options financially and logistically. Clinician processed thoughts about doing marriage counseling, but [name] reports this will not help, as her husband is not willing. Clinician explored ways for Enrique to talk to husband about her feelings and her wishes/needs.  Clinician processed self care and ways to maintain her comfort and sanity over the holidays, coping with grief, loss, disappointment with husband, and current responsibilities.    Plan: Return again in 1 month   Diagnosis: Axis I: Major Depressive  Disorder, Recurrent Mild, and Generalized Anxiety Disorder                    I discussed the assessment and treatment plan with the patient. The patient was provided an opportunity to ask questions and all were answered. The patient agreed with the plan and demonstrated an understanding of the instructions.   The patient was advised to call back or seek an in-person evaluation if the symptoms worsen or if the condition fails to improve as anticipated.  I provided 45 minutes of non-face-to-face time during this encounter.   [name], LCSW

## 2020-07-08 DIAGNOSIS — G4733 Obstructive sleep apnea (adult) (pediatric): Secondary | ICD-10-CM | POA: Diagnosis not present

## 2020-07-12 DIAGNOSIS — G4700 Insomnia, unspecified: Secondary | ICD-10-CM | POA: Diagnosis not present

## 2020-07-12 DIAGNOSIS — R69 Illness, unspecified: Secondary | ICD-10-CM | POA: Diagnosis not present

## 2020-07-12 DIAGNOSIS — E7800 Pure hypercholesterolemia, unspecified: Secondary | ICD-10-CM | POA: Diagnosis not present

## 2020-07-12 DIAGNOSIS — M159 Polyosteoarthritis, unspecified: Secondary | ICD-10-CM | POA: Diagnosis not present

## 2020-07-12 DIAGNOSIS — I10 Essential (primary) hypertension: Secondary | ICD-10-CM | POA: Diagnosis not present

## 2020-07-12 DIAGNOSIS — E785 Hyperlipidemia, unspecified: Secondary | ICD-10-CM | POA: Diagnosis not present

## 2020-07-12 DIAGNOSIS — M1990 Unspecified osteoarthritis, unspecified site: Secondary | ICD-10-CM | POA: Diagnosis not present

## 2020-07-12 DIAGNOSIS — E039 Hypothyroidism, unspecified: Secondary | ICD-10-CM | POA: Diagnosis not present

## 2020-07-12 DIAGNOSIS — E1165 Type 2 diabetes mellitus with hyperglycemia: Secondary | ICD-10-CM | POA: Diagnosis not present

## 2020-07-15 DIAGNOSIS — M13842 Other specified arthritis, left hand: Secondary | ICD-10-CM | POA: Diagnosis not present

## 2020-07-15 DIAGNOSIS — Z4789 Encounter for other orthopedic aftercare: Secondary | ICD-10-CM | POA: Diagnosis not present

## 2020-07-28 ENCOUNTER — Other Ambulatory Visit: Payer: Self-pay

## 2020-07-28 ENCOUNTER — Ambulatory Visit (INDEPENDENT_AMBULATORY_CARE_PROVIDER_SITE_OTHER): Payer: Medicare HMO | Admitting: Licensed Clinical Social Worker

## 2020-07-28 DIAGNOSIS — F331 Major depressive disorder, recurrent, moderate: Secondary | ICD-10-CM | POA: Diagnosis not present

## 2020-07-28 DIAGNOSIS — F411 Generalized anxiety disorder: Secondary | ICD-10-CM | POA: Diagnosis not present

## 2020-07-28 DIAGNOSIS — R69 Illness, unspecified: Secondary | ICD-10-CM | POA: Diagnosis not present

## 2020-07-29 ENCOUNTER — Encounter (HOSPITAL_COMMUNITY): Payer: Self-pay | Admitting: Licensed Clinical Social Worker

## 2020-07-29 NOTE — Progress Notes (Signed)
Virtual Visit via Telephone Note  I connected with [name] on 07/29/20 at  3:30 PM EST by telephone and verified that I am speaking with the correct person using two identifiers.  Location: Patient: home Provider: home office   I discussed the limitations, risks, security and privacy concerns of performing an evaluation and management service by telephone and the availability of in person appointments. I also discussed with the patient that there may be a patient responsible charge related to this service. The patient expressed understanding and agreed to proceed.     Type of Therapy: Individual Therapy   Treatment Goals addressed: Improve Psychiatric Symptoms, Elevate Mood, Improve Unhelpful Thought Patterns, decrease anxiety, learn about diagnosis, healthy coping skills   Interventions: Motivational Interviewing   Summary: [name] is a 55 y.o. female who presents with Major Depressive Disorder, Recurrent moderate, and Generalized Anxiety Disorder   Suicidal/Homicidal: No -without intent/plan   Therapist Response:   [name] met with [name] for an individual session. [name] discussed her psychiatric symptoms and her current life events. [name] shared that she continues to feel about the same as last session, low and depressed. However, she reports she has made the decision to separate from her husband, although she is not sure when or what her plan will be. [name] utilized MI OARS to reflect and summarize decision, exploring any other alternatives that would keep her in the relationship. [name] discussed plan to move away, possibly to be with someone she has known for a long time that has expressed interest in her. [name] validated the urge and noted the importance of moving slowly and intentionally through her next set of decisions. 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    Plan:  Return again in 1 month   Diagnosis: Axis I: Major Depressive Disorder, Recurrent Mild, and Generalized Anxiety Disorder                          I discussed the assessment and treatment plan with the patient. The patient was provided an opportunity to ask questions and all were answered. The patient agreed with the plan and demonstrated an understanding of the instructions.   The patient was advised to call back or seek an in-person evaluation if the symptoms worsen or if the condition fails to improve as anticipated.  I provided 45 minutes of non-face-to-face time during this encounter.   [name], LCSW

## 2020-08-06 DIAGNOSIS — E7800 Pure hypercholesterolemia, unspecified: Secondary | ICD-10-CM | POA: Diagnosis not present

## 2020-08-06 DIAGNOSIS — E1169 Type 2 diabetes mellitus with other specified complication: Secondary | ICD-10-CM | POA: Diagnosis not present

## 2020-08-06 DIAGNOSIS — Z794 Long term (current) use of insulin: Secondary | ICD-10-CM | POA: Diagnosis not present

## 2020-08-08 DIAGNOSIS — G4733 Obstructive sleep apnea (adult) (pediatric): Secondary | ICD-10-CM | POA: Diagnosis not present

## 2020-08-09 DIAGNOSIS — I10 Essential (primary) hypertension: Secondary | ICD-10-CM | POA: Diagnosis not present

## 2020-08-09 DIAGNOSIS — E039 Hypothyroidism, unspecified: Secondary | ICD-10-CM | POA: Diagnosis not present

## 2020-08-09 DIAGNOSIS — E7800 Pure hypercholesterolemia, unspecified: Secondary | ICD-10-CM | POA: Diagnosis not present

## 2020-08-09 DIAGNOSIS — R69 Illness, unspecified: Secondary | ICD-10-CM | POA: Diagnosis not present

## 2020-08-09 DIAGNOSIS — E785 Hyperlipidemia, unspecified: Secondary | ICD-10-CM | POA: Diagnosis not present

## 2020-08-09 DIAGNOSIS — M159 Polyosteoarthritis, unspecified: Secondary | ICD-10-CM | POA: Diagnosis not present

## 2020-08-09 DIAGNOSIS — E1165 Type 2 diabetes mellitus with hyperglycemia: Secondary | ICD-10-CM | POA: Diagnosis not present

## 2020-08-09 DIAGNOSIS — G4700 Insomnia, unspecified: Secondary | ICD-10-CM | POA: Diagnosis not present

## 2020-08-09 DIAGNOSIS — E1169 Type 2 diabetes mellitus with other specified complication: Secondary | ICD-10-CM | POA: Diagnosis not present

## 2020-08-11 DIAGNOSIS — H524 Presbyopia: Secondary | ICD-10-CM | POA: Diagnosis not present

## 2020-08-11 DIAGNOSIS — Z794 Long term (current) use of insulin: Secondary | ICD-10-CM | POA: Diagnosis not present

## 2020-08-11 DIAGNOSIS — E119 Type 2 diabetes mellitus without complications: Secondary | ICD-10-CM | POA: Diagnosis not present

## 2020-08-11 DIAGNOSIS — H40013 Open angle with borderline findings, low risk, bilateral: Secondary | ICD-10-CM | POA: Diagnosis not present

## 2020-08-11 DIAGNOSIS — H16223 Keratoconjunctivitis sicca, not specified as Sjogren's, bilateral: Secondary | ICD-10-CM | POA: Diagnosis not present

## 2020-08-12 ENCOUNTER — Encounter (INDEPENDENT_AMBULATORY_CARE_PROVIDER_SITE_OTHER): Payer: Self-pay | Admitting: Ophthalmology

## 2020-08-12 ENCOUNTER — Other Ambulatory Visit: Payer: Self-pay

## 2020-08-12 ENCOUNTER — Ambulatory Visit (INDEPENDENT_AMBULATORY_CARE_PROVIDER_SITE_OTHER): Payer: Medicare HMO | Admitting: Ophthalmology

## 2020-08-12 DIAGNOSIS — H35033 Hypertensive retinopathy, bilateral: Secondary | ICD-10-CM

## 2020-08-12 DIAGNOSIS — I10 Essential (primary) hypertension: Secondary | ICD-10-CM

## 2020-08-12 DIAGNOSIS — E113393 Type 2 diabetes mellitus with moderate nonproliferative diabetic retinopathy without macular edema, bilateral: Secondary | ICD-10-CM

## 2020-08-12 DIAGNOSIS — Z961 Presence of intraocular lens: Secondary | ICD-10-CM

## 2020-08-12 DIAGNOSIS — H3581 Retinal edema: Secondary | ICD-10-CM
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
## 2020-08-30 ENCOUNTER — Other Ambulatory Visit (HOSPITAL_COMMUNITY): Payer: Self-pay | Admitting: Psychiatry

## 2020-08-30 DIAGNOSIS — F99 Mental disorder, not otherwise specified: Secondary | ICD-10-CM

## 2020-08-30 DIAGNOSIS — F411 Generalized anxiety disorder: Secondary | ICD-10-CM

## 2020-08-30 DIAGNOSIS — F5105 Insomnia due to other mental disorder: Secondary | ICD-10-CM

## 2020-08-31 ENCOUNTER — Other Ambulatory Visit: Payer: Self-pay

## 2020-08-31 ENCOUNTER — Ambulatory Visit (INDEPENDENT_AMBULATORY_CARE_PROVIDER_SITE_OTHER): Payer: Medicare HMO | Admitting: Licensed Clinical Social Worker

## 2020-08-31 ENCOUNTER — Encounter (HOSPITAL_COMMUNITY): Payer: Self-pay | Admitting: Licensed Clinical Social Worker

## 2020-08-31 DIAGNOSIS — F331 Major depressive disorder, recurrent, moderate: Secondary | ICD-10-CM

## 2020-08-31 DIAGNOSIS — R69 Illness, unspecified: Secondary | ICD-10-CM | POA: Diagnosis not present

## 2020-08-31 DIAGNOSIS — F411 Generalized anxiety disorder: Secondary | ICD-10-CM
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
## 2020-09-06 ENCOUNTER — Telehealth (HOSPITAL_COMMUNITY): Payer: Self-pay | Admitting: *Deleted

## 2020-09-06 NOTE — Telephone Encounter (Signed)
Representative from [facility] called requesting refills on pt Lunesta and Lorazepam. Pt has an appointment on 09/09/20 which would last beyond appointment but they want to mail it out for 09/10/20 delivery. Refill? Or wait until appointment next week?

## 2020-09-08 DIAGNOSIS — G4733 Obstructive sleep apnea (adult) (pediatric): Secondary | ICD-10-CM | POA: Diagnosis not present

## 2020-09-09 ENCOUNTER — Telehealth (INDEPENDENT_AMBULATORY_CARE_PROVIDER_SITE_OTHER): Payer: Medicare HMO | Admitting: Psychiatry

## 2020-09-09 ENCOUNTER — Other Ambulatory Visit: Payer: Self-pay

## 2020-09-09 DIAGNOSIS — F411 Generalized anxiety disorder: Secondary | ICD-10-CM | POA: Diagnosis not present

## 2020-09-09 DIAGNOSIS — F5105 Insomnia due to other mental disorder: Secondary | ICD-10-CM

## 2020-09-09 DIAGNOSIS — R69 Illness, unspecified: Secondary | ICD-10-CM | POA: Diagnosis not present

## 2020-09-09 DIAGNOSIS — F332 Major depressive disorder, recurrent severe without psychotic features: Secondary | ICD-10-CM | POA: Diagnosis not present

## 2020-09-09 DIAGNOSIS — F99 Mental disorder, not otherwise specified: Secondary | ICD-10-CM

## 2020-09-09 MED ORDER — MIRTAZAPINE 30 MG PO TABS: 60.0000 mg | ORAL_TABLET | Freq: Every day | ORAL | 2 refills | Status: DC

## 2020-09-09 MED ORDER — ESZOPICLONE 3 MG PO TABS: 3.0000 mg | ORAL_TABLET | Freq: Every evening | ORAL | 2 refills | Status: DC | PRN

## 2020-09-09 MED ORDER — LORAZEPAM 1 MG PO TABS: 1.0000 mg | ORAL_TABLET | Freq: Two times a day (BID) | ORAL | 2 refills | Status: DC

## 2020-09-09 MED ORDER — DULOXETINE HCL 60 MG PO CPEP: 60.0000 mg | ORAL_CAPSULE | Freq: Two times a day (BID) | ORAL | 2 refills | Status: DC

## 2020-09-09 NOTE — Progress Notes (Signed)
Virtual Visit via Telephone Note  I connected with [name] on 09/09/20 at  3:30 PM EST by telephone and verified that I am speaking with the correct person using two identifiers.  Location: Patient: home Provider: office   I discussed the limitations, risks, security and privacy concerns of performing an evaluation and management service by telephone and the availability of in person appointments. I also discussed with the patient that there may be a patient responsible charge related to this service. The patient expressed understanding and agreed to proceed.   History of Present Illness: [name] states nothing has changed at home. She is still at home but is contemplating leaving him. Her sleep is poor. She has trouble falling and staying asleep. [name] has racing thoughts and she has trouble relaxing. She tries to use her CPAP but it doesn't always help. Her depression and anxiety are manageable. It is mostly related to the stress of trying to decided what to do and fear of the unknown. She denies SI/HI.    Observations/Objective:  General Appearance: unable to assess  Eye Contact:  unable to assess  Speech:  Clear and Coherent and Normal Rate  Volume:  Normal  Mood:  Anxious  Affect:  Full Range  Thought Process:  Coherent and Descriptions of Associations: Circumstantial  Orientation:  Full (Time, Place, and Person)  Thought Content:  Rumination  Suicidal Thoughts:  No  Homicidal Thoughts:  No  Memory:  Immediate;   Good  Judgement:  Good  Insight:  Good  Psychomotor Activity: unable to assess  Concentration:  Concentration: Good  Recall:  Good  Fund of Knowledge:  Good  Language:  Good  Akathisia:  unable to assess  Handed:  Right  AIMS (if indicated):     Assets:  Communication Skills Desire for Improvement Financial Resources/Insurance Housing Social Support Talents/Skills Transportation Vocational/Educational  ADL's:  unable to assess  Cognition:  WNL   Sleep:         Assessment and Plan: 1. Severe episode of recurrent major depressive disorder, without psychotic features ([place]) - DULoxetine (CYMBALTA) 60 MG capsule; Take 1 capsule (60 mg total) by mouth 2 (two) times daily.  Dispense: 60 capsule; Refill: 2 - mirtazapine (REMERON) 30 MG tablet; Take 2 tablets (60 mg total) by mouth at bedtime.  Dispense: 60 tablet; Refill: 2  2. GAD (generalized anxiety disorder) - DULoxetine (CYMBALTA) 60 MG capsule; Take 1 capsule (60 mg total) by mouth 2 (two) times daily.  Dispense: 60 capsule; Refill: 2 - LORazepam (ATIVAN) 1 MG tablet; Take 1 tablet (1 mg total) by mouth 2 (two) times daily.  Dispense: 60 tablet; Refill: 2 - mirtazapine (REMERON) 30 MG tablet; Take 2 tablets (60 mg total) by mouth at bedtime.  Dispense: 60 tablet; Refill: 2  3. Insomnia due to other mental disorder - Eszopiclone 3 MG TABS; Take 1 tablet (3 mg total) by mouth at bedtime as needed. Take immediately before bedtime  Dispense: 30 tablet; Refill: 2 - mirtazapine (REMERON) 30 MG tablet; Take 2 tablets (60 mg total) by mouth at bedtime.  Dispense: 60 tablet; Refill: 2  D/c Ambien  -encouraged to continue therapy as pt is reporting benefit.   Follow Up Instructions: In 2-3 months or sooner if needed   I discussed the assessment and treatment plan with the patient. The patient was provided an opportunity to ask questions and all were answered. The patient agreed with the plan and demonstrated an understanding of the instructions.  The patient was advised to call back or seek an in-person evaluation if the symptoms worsen or if the condition fails to improve as anticipated.  I provided 14 minutes of non-face-to-face time during this encounter.   Charlcie Cradle, MD

## 2020-09-09 NOTE — Telephone Encounter (Signed)
I refilled it today 

## 2020-09-10 DIAGNOSIS — E785 Hyperlipidemia, unspecified: Secondary | ICD-10-CM | POA: Diagnosis not present

## 2020-09-10 DIAGNOSIS — H35033 Hypertensive retinopathy, bilateral: Secondary | ICD-10-CM | POA: Diagnosis not present

## 2020-09-10 DIAGNOSIS — E1165 Type 2 diabetes mellitus with hyperglycemia: Secondary | ICD-10-CM | POA: Diagnosis not present

## 2020-09-10 DIAGNOSIS — I10 Essential (primary) hypertension: Secondary | ICD-10-CM | POA: Diagnosis not present

## 2020-09-10 DIAGNOSIS — E039 Hypothyroidism, unspecified: Secondary | ICD-10-CM | POA: Diagnosis not present

## 2020-09-10 DIAGNOSIS — M1990 Unspecified osteoarthritis, unspecified site: Secondary | ICD-10-CM | POA: Diagnosis not present

## 2020-09-10 DIAGNOSIS — E1169 Type 2 diabetes mellitus with other specified complication: Secondary | ICD-10-CM | POA: Diagnosis not present

## 2020-09-10 DIAGNOSIS — R69 Illness, unspecified: Secondary | ICD-10-CM | POA: Diagnosis not present

## 2020-09-10 DIAGNOSIS — G4700 Insomnia, unspecified: Secondary | ICD-10-CM | POA: Diagnosis not present

## 2020-09-10 DIAGNOSIS — E7800 Pure hypercholesterolemia, unspecified: Secondary | ICD-10-CM | POA: Diagnosis not present

## 2020-09-23 DIAGNOSIS — M13842 Other specified arthritis, left hand: Secondary | ICD-10-CM | POA: Diagnosis not present

## 2020-09-23 DIAGNOSIS — Z4789 Encounter for other orthopedic aftercare: Secondary | ICD-10-CM | POA: Diagnosis not present

## 2020-09-30 ENCOUNTER — Ambulatory Visit (INDEPENDENT_AMBULATORY_CARE_PROVIDER_SITE_OTHER): Payer: Medicare Other | Admitting: Licensed Clinical Social Worker

## 2020-09-30 ENCOUNTER — Other Ambulatory Visit: Payer: Self-pay

## 2020-09-30 DIAGNOSIS — F331 Major depressive disorder, recurrent, moderate: Secondary | ICD-10-CM | POA: Diagnosis not present

## 2020-09-30 DIAGNOSIS — E785 Hyperlipidemia, unspecified: Secondary | ICD-10-CM | POA: Diagnosis not present

## 2020-09-30 DIAGNOSIS — F411 Generalized anxiety disorder: Secondary | ICD-10-CM | POA: Diagnosis not present

## 2020-09-30 DIAGNOSIS — E7800 Pure hypercholesterolemia, unspecified: Secondary | ICD-10-CM | POA: Diagnosis not present

## 2020-09-30 DIAGNOSIS — M1990 Unspecified osteoarthritis, unspecified site: Secondary | ICD-10-CM | POA: Diagnosis not present

## 2020-09-30 DIAGNOSIS — I10 Essential (primary) hypertension: Secondary | ICD-10-CM | POA: Diagnosis not present

## 2020-09-30 DIAGNOSIS — H35033 Hypertensive retinopathy, bilateral: Secondary | ICD-10-CM | POA: Diagnosis not present

## 2020-09-30 DIAGNOSIS — G4700 Insomnia, unspecified: Secondary | ICD-10-CM | POA: Diagnosis not present

## 2020-09-30 DIAGNOSIS — E1169 Type 2 diabetes mellitus with other specified complication: Secondary | ICD-10-CM | POA: Diagnosis not present

## 2020-09-30 DIAGNOSIS — E039 Hypothyroidism, unspecified: Secondary | ICD-10-CM | POA: Diagnosis not present

## 2020-09-30 DIAGNOSIS — E1165 Type 2 diabetes mellitus with hyperglycemia: Secondary | ICD-10-CM | POA: Diagnosis not present

## 2020-09-30 DIAGNOSIS — M159 Polyosteoarthritis, unspecified: Secondary | ICD-10-CM | POA: Diagnosis not present

## 2020-10-11 ENCOUNTER — Encounter (HOSPITAL_COMMUNITY): Payer: Self-pay | Admitting: Licensed Clinical Social Worker

## 2020-10-11 NOTE — Progress Notes (Signed)
Virtual Visit via Telephone Note  I connected with [name] on 10/11/20 at  1:30 PM EDT by telephone and verified that I am speaking with the correct person using two identifiers.  Location: Patient: home Provider: home office   I discussed the limitations, risks, security and privacy concerns of performing an evaluation and management service by telephone and the availability of in person appointments. I also discussed with the patient that there may be a patient responsible charge related to this service. The patient expressed understanding and agreed to proceed.  Type of Therapy: Individual Therapy   Treatment Goals addressed: Improve Psychiatric Symptoms, Elevate Mood, Improve Unhelpful Thought Patterns, decrease anxiety, learn about diagnosis, healthy coping skills   Interventions: Motivational Interviewing   Summary: [name] is a 55 y.o. female who presents with Major Depressive Disorder, Recurrent moderate, and Generalized Anxiety Disorder   Suicidal/Homicidal: No -without intent/plan   Therapist Response:   [name] met with clinician for an individual session. Buffey discussed her psychiatric symptoms and her current life events. Coley shared that she has not left her husband yet, but she plans to next week. Clinician explored the updates with her "friend", who has endless resources and would like to have a romantic relationship. Clinician processed this relationship and identified that it sounded too good to be true. Clinician also learned more information about this friend and noted that Bertrice believed it to be [employer]. Clinician explored the possibility that she was being scammed, but Chanika denied this. Clinician encouraged Pamala to be careful and not to give him any personal information or money.   Clinician notified Dr. Doyne Keel about this plan and identified concern about Winta being scammed. Dr. Doyne Keel explored whether hospitalization was required, but Leina  seemed to be stable and not at a risk to herself or others. She did not present as delusional, just likely being scammed.   Plan: Return again in 1 month. [name] will remain in treatment while she is travelling until she figures out where she will settle. At that time, we will discuss possible referrals if she is outside of [place].   Diagnosis: Axis I: Major Depressive Disorder, Recurrent Mild, and Generalized Anxiety Disorder                 I discussed the assessment and treatment plan with the patient. The patient was provided an opportunity to ask questions and all were answered. The patient agreed with the plan and demonstrated an understanding of the instructions.   The patient was advised to call back or seek an in-person evaluation if the symptoms worsen or if the condition fails to improve as anticipated.  I provided 45 minutes of non-face-to-face time during this encounter.   [name], LCSW

## 2020-10-19 DIAGNOSIS — Z0000 Encounter for general adult medical examination without abnormal findings: Secondary | ICD-10-CM | POA: Diagnosis not present

## 2020-10-19 DIAGNOSIS — Z1389 Encounter for screening for other disorder: Secondary | ICD-10-CM | POA: Diagnosis not present

## 2020-10-19 DIAGNOSIS — I10 Essential (primary) hypertension: Secondary | ICD-10-CM | POA: Diagnosis not present

## 2020-10-19 DIAGNOSIS — E039 Hypothyroidism, unspecified: Secondary | ICD-10-CM | POA: Diagnosis not present

## 2020-10-19 DIAGNOSIS — R609 Edema, unspecified: Secondary | ICD-10-CM | POA: Diagnosis not present

## 2020-10-19 DIAGNOSIS — Z794 Long term (current) use of insulin: Secondary | ICD-10-CM | POA: Diagnosis not present

## 2020-10-19 DIAGNOSIS — E1169 Type 2 diabetes mellitus with other specified complication: Secondary | ICD-10-CM | POA: Diagnosis not present

## 2020-10-19 DIAGNOSIS — E7800 Pure hypercholesterolemia, unspecified: Secondary | ICD-10-CM | POA: Diagnosis not present

## 2020-10-22 DIAGNOSIS — G4733 Obstructive sleep apnea (adult) (pediatric): Secondary | ICD-10-CM | POA: Diagnosis not present

## 2020-10-28 ENCOUNTER — Ambulatory Visit (INDEPENDENT_AMBULATORY_CARE_PROVIDER_SITE_OTHER): Payer: Medicare Other | Admitting: Licensed Clinical Social Worker

## 2020-10-28 ENCOUNTER — Other Ambulatory Visit: Payer: Self-pay

## 2020-10-28 DIAGNOSIS — F331 Major depressive disorder, recurrent, moderate: Secondary | ICD-10-CM | POA: Diagnosis not present

## 2020-10-28 DIAGNOSIS — F411 Generalized anxiety disorder: Secondary | ICD-10-CM | POA: Diagnosis not present

## 2020-11-01 ENCOUNTER — Encounter (HOSPITAL_COMMUNITY): Payer: Self-pay | Admitting: Licensed Clinical Social Worker

## 2020-11-01 DIAGNOSIS — G4700 Insomnia, unspecified: Secondary | ICD-10-CM | POA: Diagnosis not present

## 2020-11-01 DIAGNOSIS — E785 Hyperlipidemia, unspecified: Secondary | ICD-10-CM | POA: Diagnosis not present

## 2020-11-01 DIAGNOSIS — I10 Essential (primary) hypertension: Secondary | ICD-10-CM | POA: Diagnosis not present

## 2020-11-01 DIAGNOSIS — E1165 Type 2 diabetes mellitus with hyperglycemia: Secondary | ICD-10-CM | POA: Diagnosis not present

## 2020-11-01 DIAGNOSIS — M1990 Unspecified osteoarthritis, unspecified site: Secondary | ICD-10-CM | POA: Diagnosis not present

## 2020-11-01 DIAGNOSIS — E039 Hypothyroidism, unspecified: Secondary | ICD-10-CM | POA: Diagnosis not present

## 2020-11-01 DIAGNOSIS — H35033 Hypertensive retinopathy, bilateral: Secondary | ICD-10-CM | POA: Diagnosis not present

## 2020-11-01 DIAGNOSIS — E1169 Type 2 diabetes mellitus with other specified complication: Secondary | ICD-10-CM | POA: Diagnosis not present

## 2020-11-01 NOTE — Progress Notes (Signed)
Virtual Visit via Telephone Note  I connected with [name] on 11/01/20 at 11:00 AM EDT by telephone and verified that I am speaking with the correct person using two identifiers.  Location: Patient: home Provider: home office   I discussed the limitations, risks, security and privacy concerns of performing an evaluation and management service by telephone and the availability of in person appointments. I also discussed with the patient that there may be a patient responsible charge related to this service. The patient expressed understanding and agreed to proceed.  Type of Therapy: Individual Therapy   Treatment Goals addressed: Improve Psychiatric Symptoms, Elevate Mood, Improve Unhelpful Thought Patterns, decrease anxiety, learn about diagnosis, healthy coping skills   Interventions: Motivational Interviewing   Summary: [name] is a 55 y.o. female who presents with Major Depressive Disorder, Recurrent moderate, and Generalized Anxiety Disorder   Suicidal/Homicidal: No -without intent/plan   Therapist Response:   [name] met with clinician for an individual session. [name] discussed her psychiatric symptoms and her current life events. Katheren shared that she is not gone yet, but her bags are packed. She reported that she continues to believe that Ned Grace will be taking her away to live with him and his family. Clinician explored reasons for the delay and processed recent communications with the individual she believes to be [employer]. Clinician warned [name] to keep her personal details and finances to herself, as well as to communicate with her family where she will be (leaving her location on her phone), so someone can rescue her if need be. Clinician explored interactions with husband over the past few weeks and noted that nothing has changed and she is increasingly unhappy in her relationship. Clinician explored other options for leaving that does not include running  away with this strange man.    Plan: Return again in 1 month. [name] will remain in treatment while she is travelling until she figures out where she will settle. At that time, we will discuss possible referrals if she is outside of [place].   Diagnosis: Axis I: Major Depressive Disorder, Recurrent Mild, and Generalized Anxiety Disorder                 I discussed the assessment and treatment plan with the patient. The patient was provided an opportunity to ask questions and all were answered. The patient agreed with the plan and demonstrated an understanding of the instructions.   The patient was advised to call back or seek an in-person evaluation if the symptoms worsen or if the condition fails to improve as anticipated.  I provided 45 minutes of non-face-to-face time during this encounter.   [name], LCSW
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
## 2020-11-10 ENCOUNTER — Encounter (INDEPENDENT_AMBULATORY_CARE_PROVIDER_SITE_OTHER): Payer: Medicare HMO | Admitting: Ophthalmology

## 2020-11-10 DIAGNOSIS — Z961 Presence of intraocular lens: Secondary | ICD-10-CM

## 2020-11-10 DIAGNOSIS — I10 Essential (primary) hypertension: Secondary | ICD-10-CM

## 2020-11-10 DIAGNOSIS — E113393 Type 2 diabetes mellitus with moderate nonproliferative diabetic retinopathy without macular edema, bilateral: Secondary | ICD-10-CM

## 2020-11-10 DIAGNOSIS — H35033 Hypertensive retinopathy, bilateral: Secondary | ICD-10-CM

## 2020-11-10 DIAGNOSIS — H3581 Retinal edema: Secondary | ICD-10-CM

## 2020-11-30 ENCOUNTER — Encounter (HOSPITAL_COMMUNITY): Payer: Self-pay | Admitting: Emergency Medicine

## 2020-11-30 ENCOUNTER — Emergency Department (HOSPITAL_COMMUNITY): Payer: Medicare HMO

## 2020-11-30 ENCOUNTER — Other Ambulatory Visit: Payer: Self-pay

## 2020-11-30 ENCOUNTER — Emergency Department (HOSPITAL_COMMUNITY)
Admission: EM | Admit: 2020-11-30 | Discharge: 2020-12-01 | Discharge status: Against Medical Advice | Payer: Medicare HMO | Attending: Emergency Medicine | Admitting: Emergency Medicine

## 2020-11-30 DIAGNOSIS — M7732 Calcaneal spur, left foot: Secondary | ICD-10-CM | POA: Diagnosis not present

## 2020-11-30 DIAGNOSIS — Z5321 Procedure and treatment not carried out due to patient leaving prior to being seen by health care provider: Secondary | ICD-10-CM | POA: Diagnosis not present

## 2020-11-30 DIAGNOSIS — M79672 Pain in left foot: Secondary | ICD-10-CM | POA: Diagnosis not present

## 2020-11-30 DIAGNOSIS — M19072 Primary osteoarthritis, left ankle and foot: Secondary | ICD-10-CM | POA: Diagnosis not present

## 2020-11-30 DIAGNOSIS — M7989 Other specified soft tissue disorders: Secondary | ICD-10-CM | POA: Diagnosis not present

## 2020-11-30 MED ORDER — ACETAMINOPHEN 500 MG PO TABS
1000.0000 mg | ORAL_TABLET | Freq: Once | ORAL | Status: AC
  Administered 2020-11-30: 1000 mg via ORAL
  Filled 2020-11-30: qty 2

## 2020-11-30 NOTE — ED Triage Notes (Signed)
Patient reports left foot pain last night denies injury or fall , patient suspects " stress fracture". No swelling or deformity.

## 2020-11-30 NOTE — ED Provider Notes (Signed)
Emergency Medicine Provider Triage Evaluation Note  [name] , a 55 y.o. female  was evaluated in triage.  Pt complains of left foot pain.  Pain began last night when patient stood up to walk.  It has been persistent throughout today.  She is able to walk, but it hurts worse.  She has not taken anything for pain.  She states this feels similar to when she had a stress fracture in the other foot.  She had surgery a year ago with [facility] for bunion and hammer toe.  No injury, fall, or pain elsewhere.  Review of Systems  Positive: Foot pain Negative: numbness  Physical Exam  BP (!) 116/104 (BP Location: Left Arm)   Pulse (!) 105   Temp 98.9 F (37.2 C) (Oral)   Resp 16   LMP 03/18/2019   SpO2 97%  Gen:   Awake, no distress   MSK:   Moves extremities without difficulty. ttp and swelling of the L foot   Medical Decision Making  Medically screening exam initiated at 7:29 PM.  Appropriate orders placed.  [name] was informed that the remainder of the evaluation will be completed by another provider, this initial triage assessment does not replace that evaluation, and the importance of remaining in the ED until their evaluation is complete.  X-ray ordered   Franchot Heidelberg, PA-C 11/30/20 1932    Elnora Morrison, MD 12/01/20 [phone]

## 2020-12-01 DIAGNOSIS — E039 Hypothyroidism, unspecified: Secondary | ICD-10-CM | POA: Diagnosis not present

## 2020-12-01 NOTE — ED Notes (Signed)
Patient left on own accord °

## 2020-12-02 ENCOUNTER — Telehealth (INDEPENDENT_AMBULATORY_CARE_PROVIDER_SITE_OTHER): Payer: Medicare HMO | Admitting: Psychiatry

## 2020-12-02 ENCOUNTER — Other Ambulatory Visit: Payer: Self-pay

## 2020-12-02 DIAGNOSIS — F332 Major depressive disorder, recurrent severe without psychotic features: Secondary | ICD-10-CM

## 2020-12-02 DIAGNOSIS — R69 Illness, unspecified: Secondary | ICD-10-CM | POA: Diagnosis not present

## 2020-12-02 DIAGNOSIS — F99 Mental disorder, not otherwise specified: Secondary | ICD-10-CM

## 2020-12-02 DIAGNOSIS — F411 Generalized anxiety disorder: Secondary | ICD-10-CM

## 2020-12-02 DIAGNOSIS — F5105 Insomnia due to other mental disorder: Secondary | ICD-10-CM | POA: Diagnosis not present

## 2020-12-02 MED ORDER — ESZOPICLONE 3 MG PO TABS: 3.0000 mg | ORAL_TABLET | Freq: Every evening | ORAL | 2 refills | Status: DC | PRN

## 2020-12-02 MED ORDER — DULOXETINE HCL 60 MG PO CPEP: 60.0000 mg | ORAL_CAPSULE | Freq: Two times a day (BID) | ORAL | 2 refills | Status: DC

## 2020-12-02 MED ORDER — MIRTAZAPINE 30 MG PO TABS: 60.0000 mg | ORAL_TABLET | Freq: Every day | ORAL | 2 refills | Status: DC

## 2020-12-02 NOTE — Progress Notes (Signed)
Virtual Visit via Telephone Note  I connected with [name] on 12/02/20 at  2:30 PM EDT by telephone and verified that I am speaking with the correct person using two identifiers.  Location: Patient: home Provider: office   I discussed the limitations, risks, security and privacy concerns of performing an evaluation and management service by telephone and the availability of in person appointments. I also discussed with the patient that there may be a patient responsible charge related to this service. The patient expressed understanding and agreed to proceed.   History of Present Illness: [name] is still living with her husband. She is still unhappy. [name] and her husband do not talk to her each other much. [name] had been talking to another guy in [place]. He tells her that he wants to be with her but every time they are supposes to meet he backs off. She has sent him about $9000 so far and has stopped as of this month. He keeps telling her that he will pay her back. She is starting to realize that this man is not interested in having a relationship with her. [name]'s cousin is sending her some money by [employer] and she is going to buy a plane ticket and leave to family in [place]. She is depressed. She is taking some college classes online. She doesn't go anywhere because she has no interest in going anywhere. Over the the last 14 days she has been depressed nearly every time. She is not sleeping well and has anhedonia. [name] eats because she has too but her appetite is poor. [name] has frequent negative self thoughts. She denies SI/HI. She does not think she is feeling anxious. She is handling things as they come. [name] still misses her mom and her mom passed 4 yrs ago.    Observations/Objective:  General Appearance: unable to assess  Eye Contact:  unable to assess  Speech:  Clear and Coherent and Normal Rate  Volume:  Normal  Mood:  Euthymic  Affect:  Appropriate and Full Range  Thought  Process:  Goal Directed, Linear and Descriptions of Associations: Intact  Orientation:  Full (Time, Place, and Person)  Thought Content:  Logical  Suicidal Thoughts:  No  Homicidal Thoughts:  No  Memory:  Immediate;   Good  Judgement:  Good  Insight:  Good  Psychomotor Activity: unable to assess  Concentration:  Concentration: Good  Recall:  Good  Fund of Knowledge:  Good  Language:  Good  Akathisia:  unable to assess  Handed:  Right  AIMS (if indicated):     Assets:  Communication Skills Desire for Improvement Financial Resources/Insurance Housing Resilience Social Support Talents/Skills Transportation Vocational/Educational  ADL's:  unable to assess  Cognition:  WNL  Sleep:         Assessment and Plan: Depression screen [facility] 2/9 12/02/2020  Decreased Interest 3  Down, Depressed, Hopeless 3  PHQ - 2 Score 6  Altered sleeping 2  Tired, decreased energy 2  Change in appetite 3  Feeling bad or failure about yourself  3  Trouble concentrating 0  Moving slowly or fidgety/restless 0  Suicidal thoughts 0  PHQ-9 Score 16  Difficult doing work/chores Somewhat difficult  Some recent data might be hidden    Flowsheet Row Video Visit from 12/02/2020 in [facility] from 11/30/2020 in [place] No Risk No Risk     - in counseling q2 months  1. Severe episode of recurrent major depressive disorder, without psychotic features ([place]) - DULoxetine (CYMBALTA) 60 MG capsule; Take 1 capsule (60 mg total) by mouth 2 (two) times daily.  Dispense: 60 capsule; Refill: 2 - mirtazapine (REMERON) 30 MG tablet; Take 2 tablets (60 mg total) by mouth at bedtime.  Dispense: 60 tablet; Refill: 2  2. GAD (generalized anxiety disorder) - DULoxetine (CYMBALTA) 60 MG capsule; Take 1 capsule (60 mg total) by mouth 2 (two) times daily.  Dispense: 60 capsule; Refill: 2 - mirtazapine (REMERON) 30  MG tablet; Take 2 tablets (60 mg total) by mouth at bedtime.  Dispense: 60 tablet; Refill: 2  3. Insomnia due to other mental disorder - mirtazapine (REMERON) 30 MG tablet; Take 2 tablets (60 mg total) by mouth at bedtime.  Dispense: 60 tablet; Refill: 2 - Eszopiclone 3 MG TABS; Take 1 tablet (3 mg total) by mouth at bedtime as needed. Take immediately before bedtime  Dispense: 30 tablet; Refill: 2 - nurse will call in script of Lunesta since pharmacy does not take e-scripts  - informed that she will have to find a local psychiatrist if she moves out of state. [name] voiced understanding.   - I expressed my concern regarding her sending money to this man in [place]. She stated she has told him that she will not send him any more money and she is moving on.   Follow Up Instructions: In 2-3 months or sooner if needed   I discussed the assessment and treatment plan with the patient. The patient was provided an opportunity to ask questions and all were answered. The patient agreed with the plan and demonstrated an understanding of the instructions.   The patient was advised to call back or seek an in-person evaluation if the symptoms worsen or if the condition fails to improve as anticipated.  I provided 18 minutes of non-face-to-face time during this encounter.   Charlcie Cradle, MD

## 2020-12-03 DIAGNOSIS — E1169 Type 2 diabetes mellitus with other specified complication: Secondary | ICD-10-CM | POA: Diagnosis not present

## 2020-12-03 DIAGNOSIS — E1165 Type 2 diabetes mellitus with hyperglycemia: Secondary | ICD-10-CM | POA: Diagnosis not present

## 2020-12-03 DIAGNOSIS — I10 Essential (primary) hypertension: Secondary | ICD-10-CM | POA: Diagnosis not present

## 2020-12-03 DIAGNOSIS — E785 Hyperlipidemia, unspecified: Secondary | ICD-10-CM | POA: Diagnosis not present

## 2020-12-03 DIAGNOSIS — E039 Hypothyroidism, unspecified: Secondary | ICD-10-CM | POA: Diagnosis not present

## 2020-12-03 DIAGNOSIS — M159 Polyosteoarthritis, unspecified: Secondary | ICD-10-CM | POA: Diagnosis not present

## 2020-12-03 DIAGNOSIS — G4700 Insomnia, unspecified: Secondary | ICD-10-CM | POA: Diagnosis not present

## 2020-12-03 DIAGNOSIS — E7800 Pure hypercholesterolemia, unspecified: Secondary | ICD-10-CM | POA: Diagnosis not present

## 2020-12-03 DIAGNOSIS — R69 Illness, unspecified: Secondary | ICD-10-CM | POA: Diagnosis not present

## 2020-12-03 DIAGNOSIS — M1990 Unspecified osteoarthritis, unspecified site: Secondary | ICD-10-CM | POA: Diagnosis not present

## 2020-12-21 ENCOUNTER — Other Ambulatory Visit (HOSPITAL_COMMUNITY): Payer: Self-pay | Admitting: Psychiatry

## 2020-12-21 DIAGNOSIS — F332 Major depressive disorder, recurrent severe without psychotic features: Secondary | ICD-10-CM

## 2020-12-21 DIAGNOSIS — F411 Generalized anxiety disorder: Secondary | ICD-10-CM

## 2020-12-21 DIAGNOSIS — F99 Mental disorder, not otherwise specified: Secondary | ICD-10-CM

## 2020-12-21 DIAGNOSIS — F5105 Insomnia due to other mental disorder: Secondary | ICD-10-CM

## 2020-12-28 ENCOUNTER — Other Ambulatory Visit (HOSPITAL_COMMUNITY): Payer: Self-pay | Admitting: Psychiatry

## 2020-12-28 DIAGNOSIS — F411 Generalized anxiety disorder: Secondary | ICD-10-CM

## 2020-12-28 DIAGNOSIS — F5105 Insomnia due to other mental disorder: Secondary | ICD-10-CM

## 2020-12-28 DIAGNOSIS — F99 Mental disorder, not otherwise specified: Secondary | ICD-10-CM

## 2020-12-30 ENCOUNTER — Other Ambulatory Visit: Payer: Self-pay

## 2020-12-30 ENCOUNTER — Encounter (INDEPENDENT_AMBULATORY_CARE_PROVIDER_SITE_OTHER): Payer: Medicare Other | Admitting: Ophthalmology

## 2021-01-03 DIAGNOSIS — E1169 Type 2 diabetes mellitus with other specified complication: Secondary | ICD-10-CM | POA: Diagnosis not present

## 2021-01-03 DIAGNOSIS — E1165 Type 2 diabetes mellitus with hyperglycemia: Secondary | ICD-10-CM | POA: Diagnosis not present

## 2021-01-03 DIAGNOSIS — E785 Hyperlipidemia, unspecified: Secondary | ICD-10-CM | POA: Diagnosis not present

## 2021-01-03 DIAGNOSIS — M159 Polyosteoarthritis, unspecified: Secondary | ICD-10-CM | POA: Diagnosis not present

## 2021-01-03 DIAGNOSIS — E039 Hypothyroidism, unspecified: Secondary | ICD-10-CM | POA: Diagnosis not present

## 2021-01-03 DIAGNOSIS — R69 Illness, unspecified: Secondary | ICD-10-CM | POA: Diagnosis not present

## 2021-01-03 DIAGNOSIS — G4700 Insomnia, unspecified: Secondary | ICD-10-CM | POA: Diagnosis not present

## 2021-01-03 DIAGNOSIS — E7800 Pure hypercholesterolemia, unspecified: Secondary | ICD-10-CM | POA: Diagnosis not present

## 2021-01-03 DIAGNOSIS — M1990 Unspecified osteoarthritis, unspecified site: Secondary | ICD-10-CM | POA: Diagnosis not present

## 2021-01-03 DIAGNOSIS — I10 Essential (primary) hypertension: Secondary | ICD-10-CM | POA: Diagnosis not present

## 2021-01-26 ENCOUNTER — Encounter (INDEPENDENT_AMBULATORY_CARE_PROVIDER_SITE_OTHER): Payer: Self-pay

## 2021-01-26 ENCOUNTER — Encounter (INDEPENDENT_AMBULATORY_CARE_PROVIDER_SITE_OTHER): Payer: Medicare HMO | Admitting: Ophthalmology

## 2021-01-28 ENCOUNTER — Other Ambulatory Visit (HOSPITAL_COMMUNITY): Payer: Self-pay | Admitting: Psychiatry

## 2021-01-28 DIAGNOSIS — F411 Generalized anxiety disorder: Secondary | ICD-10-CM

## 2021-01-28 DIAGNOSIS — F5105 Insomnia due to other mental disorder: Secondary | ICD-10-CM

## 2021-02-04 DIAGNOSIS — E1165 Type 2 diabetes mellitus with hyperglycemia: Secondary | ICD-10-CM | POA: Diagnosis not present

## 2021-02-04 DIAGNOSIS — I10 Essential (primary) hypertension: Secondary | ICD-10-CM | POA: Diagnosis not present

## 2021-02-04 DIAGNOSIS — E1169 Type 2 diabetes mellitus with other specified complication: Secondary | ICD-10-CM | POA: Diagnosis not present

## 2021-02-04 DIAGNOSIS — E7800 Pure hypercholesterolemia, unspecified: Secondary | ICD-10-CM | POA: Diagnosis not present

## 2021-02-04 DIAGNOSIS — R69 Illness, unspecified: Secondary | ICD-10-CM | POA: Diagnosis not present

## 2021-02-04 DIAGNOSIS — G4700 Insomnia, unspecified: Secondary | ICD-10-CM | POA: Diagnosis not present

## 2021-02-04 DIAGNOSIS — H35033 Hypertensive retinopathy, bilateral: Secondary | ICD-10-CM | POA: Diagnosis not present

## 2021-02-04 DIAGNOSIS — M1990 Unspecified osteoarthritis, unspecified site: Secondary | ICD-10-CM | POA: Diagnosis not present

## 2021-02-04 DIAGNOSIS — E039 Hypothyroidism, unspecified: Secondary | ICD-10-CM | POA: Diagnosis not present

## 2021-02-04 DIAGNOSIS — E785 Hyperlipidemia, unspecified: Secondary | ICD-10-CM | POA: Diagnosis not present

## 2021-02-10 NOTE — Telephone Encounter (Signed)
Patient had called regarding refills on her Lorazepam (Ativan) '1mg'$  and her Eszopiclone (Lunesta) '3mg'$ . After speaking with the patient, writer called the pharmacy to verify when and if these medications had been filled. Pharmacist stated to me that the patient told them that patient is not taking the Lorazepam or the Eszopiclone as of 02/08/21. Patient has followup appointment scheduled for 03/03/21 so patient can sort this out with her provider

## 2021-02-28 DIAGNOSIS — H35033 Hypertensive retinopathy, bilateral: Secondary | ICD-10-CM | POA: Diagnosis not present

## 2021-02-28 DIAGNOSIS — G4700 Insomnia, unspecified: Secondary | ICD-10-CM | POA: Diagnosis not present

## 2021-02-28 DIAGNOSIS — R69 Illness, unspecified: Secondary | ICD-10-CM | POA: Diagnosis not present

## 2021-02-28 DIAGNOSIS — E1165 Type 2 diabetes mellitus with hyperglycemia: Secondary | ICD-10-CM | POA: Diagnosis not present

## 2021-02-28 DIAGNOSIS — E1169 Type 2 diabetes mellitus with other specified complication: Secondary | ICD-10-CM | POA: Diagnosis not present

## 2021-02-28 DIAGNOSIS — E785 Hyperlipidemia, unspecified: Secondary | ICD-10-CM | POA: Diagnosis not present

## 2021-02-28 DIAGNOSIS — E039 Hypothyroidism, unspecified: Secondary | ICD-10-CM | POA: Diagnosis not present

## 2021-02-28 DIAGNOSIS — I10 Essential (primary) hypertension: Secondary | ICD-10-CM | POA: Diagnosis not present

## 2021-02-28 DIAGNOSIS — M159 Polyosteoarthritis, unspecified: Secondary | ICD-10-CM | POA: Diagnosis not present

## 2021-03-01 DIAGNOSIS — K130 Diseases of lips: Secondary | ICD-10-CM | POA: Diagnosis not present

## 2021-03-01 DIAGNOSIS — J329 Chronic sinusitis, unspecified: Secondary | ICD-10-CM | POA: Diagnosis not present

## 2021-03-01 DIAGNOSIS — J342 Deviated nasal septum: Secondary | ICD-10-CM | POA: Diagnosis not present

## 2021-03-03 ENCOUNTER — Other Ambulatory Visit: Payer: Self-pay

## 2021-03-03 ENCOUNTER — Telehealth (INDEPENDENT_AMBULATORY_CARE_PROVIDER_SITE_OTHER): Payer: Medicare HMO | Admitting: Psychiatry

## 2021-03-03 DIAGNOSIS — F5105 Insomnia due to other mental disorder: Secondary | ICD-10-CM

## 2021-03-03 DIAGNOSIS — F99 Mental disorder, not otherwise specified: Secondary | ICD-10-CM | POA: Diagnosis not present

## 2021-03-03 DIAGNOSIS — G4733 Obstructive sleep apnea (adult) (pediatric): Secondary | ICD-10-CM | POA: Diagnosis not present

## 2021-03-03 DIAGNOSIS — F332 Major depressive disorder, recurrent severe without psychotic features: Secondary | ICD-10-CM

## 2021-03-03 DIAGNOSIS — F411 Generalized anxiety disorder: Secondary | ICD-10-CM

## 2021-03-03 DIAGNOSIS — R69 Illness, unspecified: Secondary | ICD-10-CM | POA: Diagnosis not present

## 2021-03-03 MED ORDER — DULOXETINE HCL 60 MG PO CPEP: 60.0000 mg | ORAL_CAPSULE | Freq: Two times a day (BID) | ORAL | 2 refills | Status: DC

## 2021-03-03 MED ORDER — MIRTAZAPINE 30 MG PO TABS: 60.0000 mg | ORAL_TABLET | Freq: Every day | ORAL | 2 refills | Status: DC

## 2021-03-03 MED ORDER — ESZOPICLONE 3 MG PO TABS: 3.0000 mg | ORAL_TABLET | Freq: Every evening | ORAL | 2 refills | Status: DC | PRN

## 2021-03-03 NOTE — Progress Notes (Signed)
Virtual Visit via Telephone Note  I connected with [name] on 03/03/21 at  3:00 PM EDT by telephone and verified that I am speaking with the correct person using two identifiers.  Location: Patient: home Provider: office   I discussed the limitations, risks, security and privacy concerns of performing an evaluation and management service by telephone and the availability of in person appointments. I also discussed with the patient that there may be a patient responsible charge related to this service. The patient expressed understanding and agreed to proceed.   History of Present Illness: [name] still wants to move but doesn't know where yet. She is still living her husband but they have separated and are only sharing the space. Her depression is stable. She had about 3-4 bad days in the last 2 weeks. Her sleep is poor. It takes her hours to fall asleep.  Some nights she gets 4 hrs and other nights she gets 6 hrs. Her energy is fair despite lack of sleep. Her appetite is poor. She is gaining weight because she is not going out due to the heat. She denies SI/HI. [name]'s states that her anxiety comes and goes. Some days are bad and she sits and cries all day and night. [name] has not been able to identify a trigger. She is still in school and it is going well. She is completing all her assignments.   Observations/Objective:  General Appearance: unable to assess  Eye Contact:  unable to assess  Speech:  Clear and Coherent and Normal Rate  Volume:  Normal  Mood:  Euthymic  Affect:  Full Range  Thought Process:  Goal Directed, Linear, and Descriptions of Associations: Intact  Orientation:  Full (Time, Place, and Person)  Thought Content:  Logical  Suicidal Thoughts:  No  Homicidal Thoughts:  No  Memory:  Immediate;   Good  Judgement:  Good  Insight:  Good  Psychomotor Activity: unable to assess  Concentration:  Concentration: Good  Recall:  Good  Fund of Knowledge:  Good  Language:   Good  Akathisia:  unable to assess  Handed:  unable to assess  AIMS (if indicated):     Assets:  Communication Skills Desire for Improvement Financial Resources/Insurance Housing Resilience Social Support Talents/Skills Transportation Vocational/Educational  ADL's:  unable to assess  Cognition:  WNL  Sleep:        Assessment and Plan: Depression screen [facility] 2/9 03/03/2021 12/02/2020  Decreased Interest 0 3  Down, Depressed, Hopeless 1 3  PHQ - 2 Score 1 6  Altered sleeping - 2  Tired, decreased energy - 2  Change in appetite - 3  Feeling bad or failure about yourself  - 3  Trouble concentrating - 0  Moving slowly or fidgety/restless - 0  Suicidal thoughts - 0  PHQ-9 Score - 16  Difficult doing work/chores - Somewhat difficult  Some recent data might be hidden    Flowsheet Row Video Visit from 03/03/2021 in [facility] Video Visit from 12/02/2020 in [facility] from 11/30/2020 in [place] No Risk No Risk No Risk      D/c Ativan as pt does not need it anymore  1. Severe episode of recurrent major depressive disorder, without psychotic features ([place]) - DULoxetine (CYMBALTA) 60 MG capsule; Take 1 capsule (60 mg total) by mouth 2 (two) times daily.  Dispense: 60 capsule; Refill: 2 - mirtazapine (REMERON) 30 MG  tablet; Take 2 tablets (60 mg total) by mouth at bedtime.  Dispense: 60 tablet; Refill: 2  2. GAD (generalized anxiety disorder) - DULoxetine (CYMBALTA) 60 MG capsule; Take 1 capsule (60 mg total) by mouth 2 (two) times daily.  Dispense: 60 capsule; Refill: 2 - mirtazapine (REMERON) 30 MG tablet; Take 2 tablets (60 mg total) by mouth at bedtime.  Dispense: 60 tablet; Refill: 2  3. Insomnia due to other mental disorder - Eszopiclone 3 MG TABS; Take 1 tablet (3 mg total) by mouth at bedtime as needed. Take immediately before  bedtime  Dispense: 30 tablet; Refill: 2 - mirtazapine (REMERON) 30 MG tablet; Take 2 tablets (60 mg total) by mouth at bedtime.  Dispense: 60 tablet; Refill: 2   Follow Up Instructions: In 2-3 months or sooner if needed   I discussed the assessment and treatment plan with the patient. The patient was provided an opportunity to ask questions and all were answered. The patient agreed with the plan and demonstrated an understanding of the instructions.   The patient was advised to call back or seek an in-person evaluation if the symptoms worsen or if the condition fails to improve as anticipated.  I provided 11 minutes of non-face-to-face time during this encounter.   Charlcie Cradle, MD

## 2021-03-22 DIAGNOSIS — J329 Chronic sinusitis, unspecified: Secondary | ICD-10-CM | POA: Diagnosis not present

## 2021-03-22 DIAGNOSIS — R0981 Nasal congestion: Secondary | ICD-10-CM | POA: Diagnosis not present

## 2021-03-22 DIAGNOSIS — J342 Deviated nasal septum: Secondary | ICD-10-CM | POA: Diagnosis not present

## 2021-03-24 ENCOUNTER — Other Ambulatory Visit (HOSPITAL_COMMUNITY): Payer: Self-pay | Admitting: Psychiatry

## 2021-03-24 DIAGNOSIS — F332 Major depressive disorder, recurrent severe without psychotic features: Secondary | ICD-10-CM

## 2021-03-24 DIAGNOSIS — F99 Mental disorder, not otherwise specified: Secondary | ICD-10-CM

## 2021-03-24 DIAGNOSIS — F5105 Insomnia due to other mental disorder: Secondary | ICD-10-CM

## 2021-03-24 DIAGNOSIS — F411 Generalized anxiety disorder: Secondary | ICD-10-CM

## 2021-03-29 DIAGNOSIS — R0989 Other specified symptoms and signs involving the circulatory and respiratory systems: Secondary | ICD-10-CM | POA: Diagnosis not present

## 2021-03-29 DIAGNOSIS — R7309 Other abnormal glucose: Secondary | ICD-10-CM | POA: Diagnosis not present

## 2021-03-30 DIAGNOSIS — E785 Hyperlipidemia, unspecified: Secondary | ICD-10-CM | POA: Diagnosis not present

## 2021-03-30 DIAGNOSIS — R69 Illness, unspecified: Secondary | ICD-10-CM | POA: Diagnosis not present

## 2021-03-30 DIAGNOSIS — G4700 Insomnia, unspecified: Secondary | ICD-10-CM | POA: Diagnosis not present

## 2021-03-30 DIAGNOSIS — E1169 Type 2 diabetes mellitus with other specified complication: Secondary | ICD-10-CM | POA: Diagnosis not present

## 2021-03-30 DIAGNOSIS — E039 Hypothyroidism, unspecified: Secondary | ICD-10-CM | POA: Diagnosis not present

## 2021-03-30 DIAGNOSIS — E1165 Type 2 diabetes mellitus with hyperglycemia: Secondary | ICD-10-CM | POA: Diagnosis not present

## 2021-03-30 DIAGNOSIS — M159 Polyosteoarthritis, unspecified: Secondary | ICD-10-CM | POA: Diagnosis not present

## 2021-03-30 DIAGNOSIS — I10 Essential (primary) hypertension: Secondary | ICD-10-CM | POA: Diagnosis not present

## 2021-04-25 DIAGNOSIS — Z8742 Personal history of other diseases of the female genital tract: Secondary | ICD-10-CM | POA: Diagnosis not present

## 2021-04-25 DIAGNOSIS — Z01419 Encounter for gynecological examination (general) (routine) without abnormal findings: Secondary | ICD-10-CM | POA: Diagnosis not present

## 2021-04-25 DIAGNOSIS — N926 Irregular menstruation, unspecified: Secondary | ICD-10-CM | POA: Diagnosis not present

## 2021-04-25 DIAGNOSIS — E282 Polycystic ovarian syndrome: Secondary | ICD-10-CM | POA: Diagnosis not present

## 2021-04-29 ENCOUNTER — Other Ambulatory Visit: Payer: Self-pay | Admitting: Nurse Practitioner

## 2021-04-29 DIAGNOSIS — Z1231 Encounter for screening mammogram for malignant neoplasm of breast: Secondary | ICD-10-CM

## 2021-05-02 ENCOUNTER — Ambulatory Visit: Payer: Medicare HMO

## 2021-05-02 ENCOUNTER — Other Ambulatory Visit: Payer: Self-pay

## 2021-05-02 ENCOUNTER — Ambulatory Visit
Admission: RE | Admit: 2021-05-02 | Discharge: 2021-05-02 | Disposition: A | Discharge status: Home / Self Care | Payer: Medicare HMO | Source: Ambulatory Visit | Attending: Nurse Practitioner | Admitting: Nurse Practitioner

## 2021-05-02 DIAGNOSIS — Z794 Long term (current) use of insulin: Secondary | ICD-10-CM | POA: Diagnosis not present

## 2021-05-02 DIAGNOSIS — F319 Bipolar disorder, unspecified: Secondary | ICD-10-CM | POA: Diagnosis not present

## 2021-05-02 DIAGNOSIS — F439 Reaction to severe stress, unspecified: Secondary | ICD-10-CM | POA: Diagnosis not present

## 2021-05-02 DIAGNOSIS — E1165 Type 2 diabetes mellitus with hyperglycemia: Secondary | ICD-10-CM | POA: Diagnosis not present

## 2021-05-02 DIAGNOSIS — R69 Illness, unspecified: Secondary | ICD-10-CM | POA: Diagnosis not present

## 2021-05-02 DIAGNOSIS — E039 Hypothyroidism, unspecified: Secondary | ICD-10-CM | POA: Diagnosis not present

## 2021-05-02 DIAGNOSIS — Z6841 Body Mass Index (BMI) 40.0 and over, adult: Secondary | ICD-10-CM | POA: Diagnosis not present

## 2021-05-02 DIAGNOSIS — E1140 Type 2 diabetes mellitus with diabetic neuropathy, unspecified: Secondary | ICD-10-CM | POA: Diagnosis not present

## 2021-05-02 DIAGNOSIS — E785 Hyperlipidemia, unspecified: Secondary | ICD-10-CM | POA: Diagnosis not present

## 2021-05-02 DIAGNOSIS — Z1231 Encounter for screening mammogram for malignant neoplasm of breast: Secondary | ICD-10-CM | POA: Diagnosis not present

## 2021-05-02 DIAGNOSIS — F411 Generalized anxiety disorder: Secondary | ICD-10-CM | POA: Diagnosis not present

## 2021-05-02 DIAGNOSIS — E538 Deficiency of other specified B group vitamins: Secondary | ICD-10-CM | POA: Diagnosis not present

## 2021-05-02 DIAGNOSIS — G8929 Other chronic pain: Secondary | ICD-10-CM | POA: Diagnosis not present

## 2021-05-09 DIAGNOSIS — R053 Chronic cough: Secondary | ICD-10-CM | POA: Diagnosis not present

## 2021-05-09 DIAGNOSIS — J3089 Other allergic rhinitis: Secondary | ICD-10-CM | POA: Diagnosis not present

## 2021-05-09 DIAGNOSIS — J301 Allergic rhinitis due to pollen: Secondary | ICD-10-CM | POA: Diagnosis not present

## 2021-05-09 DIAGNOSIS — J3081 Allergic rhinitis due to animal (cat) (dog) hair and dander: Secondary | ICD-10-CM | POA: Diagnosis not present

## 2021-05-12 ENCOUNTER — Other Ambulatory Visit (HOSPITAL_COMMUNITY): Payer: Self-pay | Admitting: *Deleted

## 2021-05-12 DIAGNOSIS — F99 Mental disorder, not otherwise specified: Secondary | ICD-10-CM

## 2021-05-12 DIAGNOSIS — F5105 Insomnia due to other mental disorder: Secondary | ICD-10-CM

## 2021-05-17 ENCOUNTER — Ambulatory Visit: Payer: Medicare HMO | Attending: Internal Medicine

## 2021-05-17 ENCOUNTER — Other Ambulatory Visit (HOSPITAL_BASED_OUTPATIENT_CLINIC_OR_DEPARTMENT_OTHER): Payer: Self-pay

## 2021-05-17 DIAGNOSIS — Z23 Encounter for immunization: Secondary | ICD-10-CM

## 2021-05-17 MED ORDER — INFLUENZA VAC SPLIT QUAD 0.5 ML IM SUSY
PREFILLED_SYRINGE | INTRAMUSCULAR | 0 refills | Status: DC
  Filled 2021-05-17: qty 0.5, 1d supply, fill #0

## 2021-05-17 NOTE — Progress Notes (Signed)
   Covid-19 Vaccination Clinic  Name:  [name]    MRN: [id number] DOB: 02/13/1966  05/17/2021  [name] was observed post Covid-19 immunization for 15 minutes without incident. She was provided with Vaccine Information Sheet and instruction to access the V-Safe system.   [name] was instructed to call 911 with any severe reactions post vaccine: Difficulty breathing  Swelling of face and throat  A fast heartbeat  A bad rash all over body  Dizziness and weakness   Immunizations Administered     Name Date Dose VIS Date Route   Pfizer Covid-19 Vaccine Bivalent Booster 05/17/2021 10:39 AM 0.3 mL 03/16/2021 Intramuscular   Manufacturer: [place]   Lot: [id number]   [place]: [phone]

## 2021-05-20 DIAGNOSIS — I10 Essential (primary) hypertension: Secondary | ICD-10-CM | POA: Diagnosis not present

## 2021-05-20 DIAGNOSIS — J3089 Other allergic rhinitis: Secondary | ICD-10-CM | POA: Diagnosis not present

## 2021-05-20 DIAGNOSIS — N3281 Overactive bladder: Secondary | ICD-10-CM | POA: Diagnosis not present

## 2021-05-20 DIAGNOSIS — J3081 Allergic rhinitis due to animal (cat) (dog) hair and dander: Secondary | ICD-10-CM | POA: Diagnosis not present

## 2021-05-20 DIAGNOSIS — J301 Allergic rhinitis due to pollen: Secondary | ICD-10-CM | POA: Diagnosis not present

## 2021-05-20 DIAGNOSIS — M1990 Unspecified osteoarthritis, unspecified site: Secondary | ICD-10-CM | POA: Diagnosis not present

## 2021-05-20 DIAGNOSIS — R609 Edema, unspecified: Secondary | ICD-10-CM | POA: Diagnosis not present

## 2021-05-20 DIAGNOSIS — E1169 Type 2 diabetes mellitus with other specified complication: Secondary | ICD-10-CM | POA: Diagnosis not present

## 2021-05-20 DIAGNOSIS — R69 Illness, unspecified: Secondary | ICD-10-CM | POA: Diagnosis not present

## 2021-05-20 DIAGNOSIS — M62830 Muscle spasm of back: Secondary | ICD-10-CM | POA: Diagnosis not present

## 2021-05-20 DIAGNOSIS — E7800 Pure hypercholesterolemia, unspecified: Secondary | ICD-10-CM | POA: Diagnosis not present

## 2021-05-20 DIAGNOSIS — E039 Hypothyroidism, unspecified: Secondary | ICD-10-CM | POA: Diagnosis not present

## 2021-05-20 DIAGNOSIS — Z794 Long term (current) use of insulin: Secondary | ICD-10-CM | POA: Diagnosis not present

## 2021-05-26 ENCOUNTER — Telehealth (HOSPITAL_COMMUNITY): Payer: Medicare HMO | Admitting: Psychiatry

## 2021-05-26 ENCOUNTER — Other Ambulatory Visit: Payer: Self-pay

## 2021-05-26 ENCOUNTER — Telehealth (HOSPITAL_COMMUNITY): Payer: Self-pay | Admitting: Psychiatry

## 2021-05-26 DIAGNOSIS — M13822 Other specified arthritis, left elbow: Secondary | ICD-10-CM | POA: Diagnosis not present

## 2021-05-26 DIAGNOSIS — G5622 Lesion of ulnar nerve, left upper limb: Secondary | ICD-10-CM | POA: Diagnosis not present

## 2021-05-26 NOTE — Telephone Encounter (Signed)
I called the patient at our scheduled appointment time several times.  There was no answer. I was unable leave a voice message for patient to call the clinic back at their convenience.

## 2021-05-30 DIAGNOSIS — R69 Illness, unspecified: Secondary | ICD-10-CM | POA: Diagnosis not present

## 2021-05-30 DIAGNOSIS — E039 Hypothyroidism, unspecified: Secondary | ICD-10-CM | POA: Diagnosis not present

## 2021-05-30 DIAGNOSIS — I10 Essential (primary) hypertension: Secondary | ICD-10-CM | POA: Diagnosis not present

## 2021-05-30 DIAGNOSIS — H35033 Hypertensive retinopathy, bilateral: Secondary | ICD-10-CM | POA: Diagnosis not present

## 2021-05-30 DIAGNOSIS — E1169 Type 2 diabetes mellitus with other specified complication: Secondary | ICD-10-CM | POA: Diagnosis not present

## 2021-05-30 DIAGNOSIS — E785 Hyperlipidemia, unspecified: Secondary | ICD-10-CM | POA: Diagnosis not present

## 2021-05-30 DIAGNOSIS — M1990 Unspecified osteoarthritis, unspecified site: Secondary | ICD-10-CM | POA: Diagnosis not present

## 2021-05-30 DIAGNOSIS — E7800 Pure hypercholesterolemia, unspecified: Secondary | ICD-10-CM | POA: Diagnosis not present

## 2021-05-30 DIAGNOSIS — E1165 Type 2 diabetes mellitus with hyperglycemia: Secondary | ICD-10-CM | POA: Diagnosis not present

## 2021-05-30 DIAGNOSIS — G4700 Insomnia, unspecified: Secondary | ICD-10-CM | POA: Diagnosis not present

## 2021-05-31 DIAGNOSIS — J301 Allergic rhinitis due to pollen: Secondary | ICD-10-CM | POA: Diagnosis not present

## 2021-05-31 DIAGNOSIS — J3089 Other allergic rhinitis: Secondary | ICD-10-CM | POA: Diagnosis not present

## 2021-05-31 DIAGNOSIS — J3081 Allergic rhinitis due to animal (cat) (dog) hair and dander: Secondary | ICD-10-CM | POA: Diagnosis not present

## 2021-06-03 DIAGNOSIS — J301 Allergic rhinitis due to pollen: Secondary | ICD-10-CM | POA: Diagnosis not present

## 2021-06-03 DIAGNOSIS — J3089 Other allergic rhinitis: Secondary | ICD-10-CM | POA: Diagnosis not present

## 2021-06-03 DIAGNOSIS — J3081 Allergic rhinitis due to animal (cat) (dog) hair and dander: Secondary | ICD-10-CM | POA: Diagnosis not present

## 2021-06-06 ENCOUNTER — Other Ambulatory Visit (HOSPITAL_BASED_OUTPATIENT_CLINIC_OR_DEPARTMENT_OTHER): Payer: Self-pay

## 2021-06-06 MED ORDER — PFIZER COVID-19 VAC BIVALENT 30 MCG/0.3ML IM SUSP
INTRAMUSCULAR | 0 refills | Status: DC
  Filled 2021-06-06: qty 0.3, 1d supply, fill #0

## 2021-06-13 DIAGNOSIS — J3089 Other allergic rhinitis: Secondary | ICD-10-CM | POA: Diagnosis not present

## 2021-06-13 DIAGNOSIS — J301 Allergic rhinitis due to pollen: Secondary | ICD-10-CM | POA: Diagnosis not present

## 2021-06-15 DIAGNOSIS — J301 Allergic rhinitis due to pollen: Secondary | ICD-10-CM | POA: Diagnosis not present

## 2021-06-15 DIAGNOSIS — J3089 Other allergic rhinitis: Secondary | ICD-10-CM | POA: Diagnosis not present

## 2021-06-15 DIAGNOSIS — J3081 Allergic rhinitis due to animal (cat) (dog) hair and dander: Secondary | ICD-10-CM | POA: Diagnosis not present

## 2021-06-20 DIAGNOSIS — J3081 Allergic rhinitis due to animal (cat) (dog) hair and dander: Secondary | ICD-10-CM | POA: Diagnosis not present

## 2021-06-20 DIAGNOSIS — J3089 Other allergic rhinitis: Secondary | ICD-10-CM | POA: Diagnosis not present

## 2021-06-20 DIAGNOSIS — J301 Allergic rhinitis due to pollen: Secondary | ICD-10-CM | POA: Diagnosis not present

## 2021-06-21 ENCOUNTER — Other Ambulatory Visit (HOSPITAL_COMMUNITY): Payer: Self-pay | Admitting: Psychiatry

## 2021-06-21 DIAGNOSIS — F5105 Insomnia due to other mental disorder: Secondary | ICD-10-CM

## 2021-06-21 DIAGNOSIS — F411 Generalized anxiety disorder: Secondary | ICD-10-CM

## 2021-06-21 DIAGNOSIS — F332 Major depressive disorder, recurrent severe without psychotic features: Secondary | ICD-10-CM

## 2021-06-21 DIAGNOSIS — M13822 Other specified arthritis, left elbow: Secondary | ICD-10-CM | POA: Diagnosis not present

## 2021-06-21 DIAGNOSIS — F99 Mental disorder, not otherwise specified: Secondary | ICD-10-CM

## 2021-06-21 DIAGNOSIS — M25522 Pain in left elbow: Secondary | ICD-10-CM | POA: Diagnosis not present

## 2021-06-22 DIAGNOSIS — J301 Allergic rhinitis due to pollen: Secondary | ICD-10-CM | POA: Diagnosis not present

## 2021-06-22 DIAGNOSIS — J3081 Allergic rhinitis due to animal (cat) (dog) hair and dander: Secondary | ICD-10-CM | POA: Diagnosis not present

## 2021-06-22 DIAGNOSIS — J3089 Other allergic rhinitis: Secondary | ICD-10-CM | POA: Diagnosis not present

## 2021-06-24 DIAGNOSIS — J301 Allergic rhinitis due to pollen: Secondary | ICD-10-CM | POA: Diagnosis not present

## 2021-06-24 DIAGNOSIS — J3089 Other allergic rhinitis: Secondary | ICD-10-CM | POA: Diagnosis not present

## 2021-06-27 DIAGNOSIS — J3089 Other allergic rhinitis: Secondary | ICD-10-CM | POA: Diagnosis not present

## 2021-06-27 DIAGNOSIS — J301 Allergic rhinitis due to pollen: Secondary | ICD-10-CM | POA: Diagnosis not present

## 2021-06-27 DIAGNOSIS — J3081 Allergic rhinitis due to animal (cat) (dog) hair and dander: Secondary | ICD-10-CM | POA: Diagnosis not present

## 2021-06-29 DIAGNOSIS — E1169 Type 2 diabetes mellitus with other specified complication: Secondary | ICD-10-CM | POA: Diagnosis not present

## 2021-06-29 DIAGNOSIS — E7800 Pure hypercholesterolemia, unspecified: Secondary | ICD-10-CM | POA: Diagnosis not present

## 2021-06-29 DIAGNOSIS — J301 Allergic rhinitis due to pollen: Secondary | ICD-10-CM | POA: Diagnosis not present

## 2021-06-29 DIAGNOSIS — J3089 Other allergic rhinitis: Secondary | ICD-10-CM | POA: Diagnosis not present

## 2021-06-29 DIAGNOSIS — I10 Essential (primary) hypertension: Secondary | ICD-10-CM | POA: Diagnosis not present

## 2021-06-29 DIAGNOSIS — J3081 Allergic rhinitis due to animal (cat) (dog) hair and dander: Secondary | ICD-10-CM | POA: Diagnosis not present

## 2021-06-29 DIAGNOSIS — E039 Hypothyroidism, unspecified: Secondary | ICD-10-CM | POA: Diagnosis not present

## 2021-06-29 DIAGNOSIS — E1165 Type 2 diabetes mellitus with hyperglycemia: Secondary | ICD-10-CM | POA: Diagnosis not present

## 2021-07-01 DIAGNOSIS — J301 Allergic rhinitis due to pollen: Secondary | ICD-10-CM | POA: Diagnosis not present

## 2021-07-01 DIAGNOSIS — J3081 Allergic rhinitis due to animal (cat) (dog) hair and dander: Secondary | ICD-10-CM | POA: Diagnosis not present

## 2021-07-01 DIAGNOSIS — J3089 Other allergic rhinitis: Secondary | ICD-10-CM | POA: Diagnosis not present

## 2021-07-04 DIAGNOSIS — J3089 Other allergic rhinitis: Secondary | ICD-10-CM | POA: Diagnosis not present

## 2021-07-04 DIAGNOSIS — M25522 Pain in left elbow: Secondary | ICD-10-CM | POA: Diagnosis not present

## 2021-07-04 DIAGNOSIS — J301 Allergic rhinitis due to pollen: Secondary | ICD-10-CM | POA: Diagnosis not present

## 2021-07-04 DIAGNOSIS — J3081 Allergic rhinitis due to animal (cat) (dog) hair and dander: Secondary | ICD-10-CM | POA: Diagnosis not present

## 2021-07-06 DIAGNOSIS — J3089 Other allergic rhinitis: Secondary | ICD-10-CM | POA: Diagnosis not present

## 2021-07-06 DIAGNOSIS — J301 Allergic rhinitis due to pollen: Secondary | ICD-10-CM | POA: Diagnosis not present

## 2021-07-06 DIAGNOSIS — J3081 Allergic rhinitis due to animal (cat) (dog) hair and dander: Secondary | ICD-10-CM | POA: Diagnosis not present

## 2021-07-12 DIAGNOSIS — J3089 Other allergic rhinitis: Secondary | ICD-10-CM | POA: Diagnosis not present

## 2021-07-12 DIAGNOSIS — J3081 Allergic rhinitis due to animal (cat) (dog) hair and dander: Secondary | ICD-10-CM | POA: Diagnosis not present

## 2021-07-12 DIAGNOSIS — J301 Allergic rhinitis due to pollen: Secondary | ICD-10-CM | POA: Diagnosis not present

## 2021-07-14 DIAGNOSIS — J3089 Other allergic rhinitis: Secondary | ICD-10-CM | POA: Diagnosis not present

## 2021-07-14 DIAGNOSIS — J301 Allergic rhinitis due to pollen: Secondary | ICD-10-CM | POA: Diagnosis not present

## 2021-07-14 DIAGNOSIS — J3081 Allergic rhinitis due to animal (cat) (dog) hair and dander: Secondary | ICD-10-CM | POA: Diagnosis not present

## 2021-07-15 DIAGNOSIS — M25522 Pain in left elbow: Secondary | ICD-10-CM | POA: Diagnosis not present

## 2021-07-19 DIAGNOSIS — J3089 Other allergic rhinitis: Secondary | ICD-10-CM | POA: Diagnosis not present

## 2021-07-19 DIAGNOSIS — J301 Allergic rhinitis due to pollen: Secondary | ICD-10-CM | POA: Diagnosis not present

## 2021-07-19 DIAGNOSIS — J3081 Allergic rhinitis due to animal (cat) (dog) hair and dander: Secondary | ICD-10-CM | POA: Diagnosis not present

## 2021-07-21 DIAGNOSIS — J3081 Allergic rhinitis due to animal (cat) (dog) hair and dander: Secondary | ICD-10-CM | POA: Diagnosis not present

## 2021-07-21 DIAGNOSIS — J3089 Other allergic rhinitis: Secondary | ICD-10-CM | POA: Diagnosis not present

## 2021-07-21 DIAGNOSIS — J301 Allergic rhinitis due to pollen: Secondary | ICD-10-CM | POA: Diagnosis not present

## 2021-07-25 DIAGNOSIS — M67322 Transient synovitis, left elbow: Secondary | ICD-10-CM
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
## 2021-07-26 ENCOUNTER — Encounter (HOSPITAL_COMMUNITY): Payer: Self-pay | Admitting: Orthopedic Surgery

## 2021-07-26 ENCOUNTER — Other Ambulatory Visit: Payer: Self-pay

## 2021-07-26 DIAGNOSIS — G4733 Obstructive sleep apnea (adult) (pediatric): Secondary | ICD-10-CM | POA: Diagnosis not present

## 2021-07-26 DIAGNOSIS — J301 Allergic rhinitis due to pollen: Secondary | ICD-10-CM | POA: Diagnosis not present

## 2021-07-26 DIAGNOSIS — R69 Illness, unspecified: Secondary | ICD-10-CM | POA: Diagnosis not present

## 2021-07-26 DIAGNOSIS — G4700 Insomnia, unspecified: Secondary | ICD-10-CM | POA: Diagnosis not present

## 2021-07-26 DIAGNOSIS — J3089 Other allergic rhinitis: Secondary | ICD-10-CM | POA: Diagnosis not present

## 2021-07-26 DIAGNOSIS — E039 Hypothyroidism, unspecified: Secondary | ICD-10-CM | POA: Diagnosis not present

## 2021-07-26 DIAGNOSIS — H35033 Hypertensive retinopathy, bilateral: Secondary | ICD-10-CM | POA: Diagnosis not present

## 2021-07-26 DIAGNOSIS — E7800 Pure hypercholesterolemia, unspecified: Secondary | ICD-10-CM | POA: Diagnosis not present

## 2021-07-26 DIAGNOSIS — E1169 Type 2 diabetes mellitus with other specified complication: Secondary | ICD-10-CM | POA: Diagnosis not present

## 2021-07-26 DIAGNOSIS — J3081 Allergic rhinitis due to animal (cat) (dog) hair and dander: Secondary | ICD-10-CM | POA: Diagnosis not present

## 2021-07-26 DIAGNOSIS — I10 Essential (primary) hypertension: Secondary | ICD-10-CM | POA: Diagnosis not present

## 2021-07-26 DIAGNOSIS — M1990 Unspecified osteoarthritis, unspecified site: Secondary | ICD-10-CM | POA: Diagnosis not present
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
## 2021-07-26 NOTE — Anesthesia Preprocedure Evaluation (Addendum)
Anesthesia Evaluation  Patient identified by MRN, date of birth, ID band Patient awake    Reviewed: Allergy & Precautions, NPO status , Patient's Chart, lab work & pertinent test results  Airway Mallampati: III  TM Distance: >3 FB Neck ROM: Full    Dental  (+) Dental Advisory Given, Teeth Intact   Pulmonary asthma , sleep apnea , pneumonia,    Pulmonary exam normal breath sounds clear to auscultation       Cardiovascular hypertension, Pt. on medications Normal cardiovascular exam Rhythm:Regular Rate:Normal  Echo 04/2019 1. Left ventricular ejection fraction, by visual estimation, is 60 to 65%. The left ventricle has normal function. There is no left ventricular hypertrophy.  2. Left ventricular diastolic parameters are consistent with Grade I diastolic dysfunction (impaired relaxation).  3. The left ventricle has no regional wall motion abnormalities.  4. Global right ventricle has normal systolic function.The right ventricular size is normal. No increase in right ventricular wall thickness.  5. Left atrial size was normal.  6. Right atrial size was normal.  7. The mitral valve is normal in structure. No evidence of mitral valve regurgitation. No evidence of mitral stenosis.  8. The tricuspid valve is normal in structure. Tricuspid valve regurgitation is not demonstrated.  9. The aortic valve is normal in structure. Aortic valve regurgitation is not visualized. No evidence of aortic valve sclerosis or stenosis.  10. The pulmonic valve was normal in structure. Pulmonic valve regurgitation is not visualized.  11. The inferior vena cava is normal in size with greater than 50% respiratory variability, suggesting right atrial pressure of 3 mmHg.    Neuro/Psych  Headaches, PSYCHIATRIC DISORDERS Anxiety Depression  Neuromuscular disease    GI/Hepatic Neg liver ROS, GERD  ,  Endo/Other  diabetesHypothyroidism Morbid obesity   Renal/GU negative Renal ROS     Musculoskeletal  (+) Arthritis , Fibromyalgia -  Abdominal (+) + obese,   Peds  Hematology  (+) Blood dyscrasia, anemia ,   Anesthesia Other Findings   Reproductive/Obstetrics                           Anesthesia Physical Anesthesia Plan  ASA: 3  Anesthesia Plan: Regional   Post-op Pain Management: Regional block   Induction: Intravenous  PONV Risk Score and Plan: 2 and Ondansetron, Propofol infusion, TIVA, Treatment may vary due to age or medical condition and Midazolam  Airway Management Planned: Natural Airway  Additional Equipment:   Intra-op Plan:   Post-operative Plan:   Informed Consent: I have reviewed the patients History and Physical, chart, labs and discussed the procedure including the risks, benefits and alternatives for the proposed anesthesia with the patient or authorized representative who has indicated his/her understanding and acceptance.     Dental advisory given  Plan Discussed with: CRNA  Anesthesia Plan Comments: (PAT note written 07/26/2021 by Myra Gianotti, PA-C. )      Anesthesia Quick Evaluation

## 2021-07-26 NOTE — Progress Notes (Addendum)
[name] denies chest pain or shortness of breath. [name] denies having any s/s of Covid in her household.  [name] denies any known exposure to Covid.   [name] has type II diabetes.  [name] reports that CBGs run less than 150, lowest was 96, she checks CBG 1 time a day.I instructed [name] to take 39 units of Basaglar in am if CBG is greater than 70. I instructed [name] to check CBG after awaking and every 2 hours until arrival  to the hospital.  I Instructed [name] if CBG is less than 70 to take 4 Glucose Tablets or 1 tube of Glucose Gel or 1/2 cup of a clear juice. Recheck CBG in 15 minutes if CBG is not over 70 call, pre- op desk at [phone] for further instructions. If scheduled to receive Insulin, do not take Insulin.  I instructed [name] to shower with antibiotic soap, if it is available.  Dry off with a clean towel. Do not put lotion, powder, cologne or deodorant or makeup.No jewelry or piercings. Men may shave their face and neck. Woman should not shave. No nail polish, artificial or acrylic nails. Wear clean clothes, brush your teeth. Glasses, contact lens,dentures or partials may not be worn in the OR. If you need to wear them, please bring a case for glasses, do not wear contacts or bring a case, the hospital does not have contact cases, dentures or partials will have to be removed , make sure they are clean, we will provide a denture cup to put them in. You will need some one to drive you home and a responsible person over the age of 61 to stay with you for the first 24 hours after surgery.

## 2021-07-27 ENCOUNTER — Other Ambulatory Visit: Payer: Self-pay

## 2021-07-27 ENCOUNTER — Encounter (HOSPITAL_COMMUNITY): Payer: Self-pay | Admitting: Orthopedic Surgery

## 2021-07-27 ENCOUNTER — Ambulatory Visit (HOSPITAL_COMMUNITY): Payer: Medicare HMO | Admitting: Vascular Surgery

## 2021-07-27 ENCOUNTER — Encounter (HOSPITAL_COMMUNITY)
Admission: RE | Disposition: A | Discharge status: Home / Self Care | Payer: Self-pay | Source: Ambulatory Visit | Attending: Orthopedic Surgery

## 2021-07-27 ENCOUNTER — Ambulatory Visit (HOSPITAL_COMMUNITY): Payer: Medicare HMO

## 2021-07-27 ENCOUNTER — Ambulatory Visit (HOSPITAL_COMMUNITY)
Admission: RE | Admit: 2021-07-27 | Discharge: 2021-07-27 | Disposition: A | Discharge status: Home / Self Care | Payer: Medicare HMO | Source: Ambulatory Visit | Attending: Orthopedic Surgery | Admitting: Orthopedic Surgery

## 2021-07-27 DIAGNOSIS — M797 Fibromyalgia: Secondary | ICD-10-CM | POA: Insufficient documentation

## 2021-07-27 DIAGNOSIS — M67322 Transient synovitis, left elbow: Secondary | ICD-10-CM

## 2021-07-27 DIAGNOSIS — M25722 Osteophyte, left elbow: Secondary | ICD-10-CM | POA: Diagnosis not present

## 2021-07-27 DIAGNOSIS — J45909 Unspecified asthma, uncomplicated: Secondary | ICD-10-CM | POA: Insufficient documentation

## 2021-07-27 DIAGNOSIS — Z6841 Body Mass Index (BMI) 40.0 and over, adult: Secondary | ICD-10-CM | POA: Diagnosis not present

## 2021-07-27 DIAGNOSIS — Z79899 Other long term (current) drug therapy: Secondary | ICD-10-CM | POA: Insufficient documentation

## 2021-07-27 DIAGNOSIS — I10 Essential (primary) hypertension: Secondary | ICD-10-CM | POA: Diagnosis not present

## 2021-07-27 DIAGNOSIS — G8918 Other acute postprocedural pain: Secondary | ICD-10-CM | POA: Diagnosis not present

## 2021-07-27 DIAGNOSIS — M659 Synovitis and tenosynovitis, unspecified: Secondary | ICD-10-CM | POA: Diagnosis not present

## 2021-07-27 DIAGNOSIS — M6588 Other synovitis and tenosynovitis, other site: Secondary | ICD-10-CM | POA: Diagnosis not present

## 2021-07-27 DIAGNOSIS — G4730 Sleep apnea, unspecified: Secondary | ICD-10-CM | POA: Diagnosis not present

## 2021-07-27 DIAGNOSIS — K219 Gastro-esophageal reflux disease without esophagitis: Secondary | ICD-10-CM | POA: Diagnosis not present

## 2021-07-27 DIAGNOSIS — M24522 Contracture, left elbow: Secondary | ICD-10-CM | POA: Diagnosis not present

## 2021-07-27 DIAGNOSIS — M13822 Other specified arthritis, left elbow: Secondary | ICD-10-CM | POA: Diagnosis not present

## 2021-07-27 DIAGNOSIS — M129 Arthropathy, unspecified: Secondary | ICD-10-CM | POA: Diagnosis not present

## 2021-07-27 HISTORY — DX: Polycystic ovarian syndrome: E28.2

## 2021-07-27 HISTORY — DX: Overactive bladder: N32.81

## 2021-07-27 HISTORY — DX: Concussion with loss of consciousness of unspecified duration, initial encounter: S06.0X9A

## 2021-07-27 HISTORY — PX: ELBOW ARTHROSCOPY: SHX614

## 2021-07-27 LAB — BASIC METABOLIC PANEL
Anion gap: 7 (ref 5–15)
BUN: 21 mg/dL — ABNORMAL HIGH (ref 6–20)
CO2: 26 mmol/L (ref 22–32)
Calcium: 9.1 mg/dL (ref 8.9–10.3)
Chloride: 102 mmol/L (ref 98–111)
Creatinine, Ser: 0.75 mg/dL (ref 0.44–1.00)
GFR, Estimated: >60 mL/min (ref >60)
Glucose, Bld: 95 mg/dL (ref 70–99)
Potassium: 3.6 mmol/L (ref 3.5–5.1)
Sodium: 135 mmol/L (ref 135–145)

## 2021-07-27 LAB — GLUCOSE, CAPILLARY
Glucose-Capillary: 100 mg/dL — ABNORMAL HIGH (ref 70–99)
Glucose-Capillary: 97 mg/dL (ref 70–99)

## 2021-07-27 LAB — CBC
HCT: 40.9 % (ref 36.0–46.0)
Hemoglobin: 12.9 g/dL (ref 12.0–15.0)
MCH: 28.9 pg (ref 26.0–34.0)
MCHC: 31.5 g/dL (ref 30.0–36.0)
MCV: 91.5 fL (ref 80.0–100.0)
Platelets: 198 10*3/uL (ref 150–400)
RBC: 4.47 MIL/uL (ref 3.87–5.11)
RDW: 14.3 % (ref 11.5–15.5)
WBC: 8.1 10*3/uL (ref 4.0–10.5)
nRBC: 0 % (ref 0.0–0.2)

## 2021-07-27 LAB — POCT PREGNANCY, URINE: Preg Test, Ur: NEGATIVE

## 2021-07-27 SURGERY — ARTHROSCOPY, ELBOW
Anesthesia: Regional | Site: Elbow | Laterality: Left

## 2021-07-27 MED ORDER — LACTATED RINGERS IV SOLN: INTRAVENOUS | Status: DC

## 2021-07-27 MED ORDER — PROPOFOL 10 MG/ML IV BOLUS
INTRAVENOUS | Status: DC | PRN
  Administered 2021-07-27: 140 ug via INTRAVENOUS

## 2021-07-27 MED ORDER — CLONIDINE HCL (ANALGESIA) 100 MCG/ML EP SOLN
EPIDURAL | Status: DC | PRN
  Administered 2021-07-27: 50 ug

## 2021-07-27 MED ORDER — OXYCODONE HCL 5 MG/5ML PO SOLN: 5.0000 mg | Freq: Once | ORAL | Status: AC | PRN

## 2021-07-27 MED ORDER — PROPOFOL 500 MG/50ML IV EMUL
INTRAVENOUS | Status: DC | PRN
  Administered 2021-07-27: 50 ug/kg/min via INTRAVENOUS

## 2021-07-27 MED ORDER — CHLORHEXIDINE GLUCONATE 0.12 % MT SOLN
15.0000 mL | Freq: Once | OROMUCOSAL | Status: AC
  Administered 2021-07-27: 15 mL via OROMUCOSAL
  Filled 2021-07-27: qty 15

## 2021-07-27 MED ORDER — DEXAMETHASONE SODIUM PHOSPHATE 4 MG/ML IJ SOLN
INTRAMUSCULAR | Status: DC | PRN
  Administered 2021-07-27: 4 mg via PERINEURAL

## 2021-07-27 MED ORDER — PROPOFOL 500 MG/50ML IV EMUL: INTRAVENOUS | Status: DC | PRN

## 2021-07-27 MED ORDER — BUPIVACAINE HCL (PF) 0.25 % IJ SOLN
INTRAMUSCULAR | Status: AC
  Filled 2021-07-27: qty 30

## 2021-07-27 MED ORDER — OXYCODONE-ACETAMINOPHEN 10-325 MG PO TABS: 1.0000 | ORAL_TABLET | Freq: Four times a day (QID) | ORAL | 0 refills | Status: AC | PRN

## 2021-07-27 MED ORDER — OXYCODONE HCL 5 MG PO TABS
ORAL_TABLET | ORAL | Status: AC
  Filled 2021-07-27: qty 1

## 2021-07-27 MED ORDER — DOCUSATE SODIUM 100 MG PO CAPS
100.0000 mg | ORAL_CAPSULE | Freq: Every day | ORAL | 2 refills | Status: DC | PRN
Start: 2021-07-27 — End: 2022-06-15

## 2021-07-27 MED ORDER — FENTANYL CITRATE (PF) 100 MCG/2ML IJ SOLN
25.0000 ug | INTRAMUSCULAR | Status: DC | PRN
  Administered 2021-07-27 (×2): 50 ug via INTRAVENOUS

## 2021-07-27 MED ORDER — MIDAZOLAM HCL 2 MG/2ML IJ SOLN: 2.0000 mg | Freq: Once | INTRAMUSCULAR | Status: AC

## 2021-07-27 MED ORDER — FENTANYL CITRATE (PF) 250 MCG/5ML IJ SOLN
INTRAMUSCULAR | Status: DC | PRN
  Administered 2021-07-27: 100 ug via INTRAVENOUS

## 2021-07-27 MED ORDER — CEFAZOLIN IN SODIUM CHLORIDE 3-0.9 GM/100ML-% IV SOLN
3.0000 g | INTRAVENOUS | Status: AC
  Administered 2021-07-27: 3 g via INTRAVENOUS
  Filled 2021-07-27: qty 100

## 2021-07-27 MED ORDER — KETAMINE HCL 50 MG/5ML IJ SOSY
PREFILLED_SYRINGE | INTRAMUSCULAR | Status: AC
  Filled 2021-07-27: qty 5

## 2021-07-27 MED ORDER — FENTANYL CITRATE (PF) 250 MCG/5ML IJ SOLN
INTRAMUSCULAR | Status: AC
  Filled 2021-07-27: qty 5

## 2021-07-27 MED ORDER — OXYCODONE HCL 5 MG PO TABS
5.0000 mg | ORAL_TABLET | Freq: Once | ORAL | Status: AC | PRN
  Administered 2021-07-27: 5 mg via ORAL

## 2021-07-27 MED ORDER — FENTANYL CITRATE (PF) 100 MCG/2ML IJ SOLN
INTRAMUSCULAR | Status: AC
  Administered 2021-07-27: 100 ug via INTRAVENOUS
  Filled 2021-07-27: qty 2

## 2021-07-27 MED ORDER — ONDANSETRON HCL 4 MG/2ML IJ SOLN
INTRAMUSCULAR | Status: AC
  Filled 2021-07-27: qty 2

## 2021-07-27 MED ORDER — ROPIVACAINE HCL 7.5 MG/ML IJ SOLN
INTRAMUSCULAR | Status: DC | PRN
  Administered 2021-07-27: 20 mL via PERINEURAL

## 2021-07-27 MED ORDER — METHOCARBAMOL 500 MG PO TABS
500.0000 mg | ORAL_TABLET | Freq: Four times a day (QID) | ORAL | 0 refills | Status: AC | PRN
Start: 2021-07-27 — End: 2021-08-06

## 2021-07-27 MED ORDER — ONDANSETRON HCL 4 MG/2ML IJ SOLN
INTRAMUSCULAR | Status: DC | PRN
  Administered 2021-07-27: 4 mg via INTRAVENOUS

## 2021-07-27 MED ORDER — ORAL CARE MOUTH RINSE: 15.0000 mL | Freq: Once | OROMUCOSAL | Status: AC

## 2021-07-27 MED ORDER — SODIUM CHLORIDE 0.9 % IR SOLN
Status: DC | PRN
  Administered 2021-07-27 (×6): 3000 mL

## 2021-07-27 MED ORDER — MIDAZOLAM HCL 2 MG/2ML IJ SOLN
INTRAMUSCULAR | Status: AC
  Administered 2021-07-27: 2 mg via INTRAVENOUS
  Filled 2021-07-27: qty 2

## 2021-07-27 MED ORDER — EPHEDRINE SULFATE-NACL 50-0.9 MG/10ML-% IV SOSY: PREFILLED_SYRINGE | INTRAVENOUS | Status: DC | PRN

## 2021-07-27 MED ORDER — LIDOCAINE 2% (20 MG/ML) 5 ML SYRINGE
INTRAMUSCULAR | Status: DC | PRN
Start: 2021-07-27 — End: 2021-07-27
  Administered 2021-07-27: 60 mg via INTRAVENOUS
  Administered 2021-07-27: 40 mg via INTRAVENOUS

## 2021-07-27 MED ORDER — FENTANYL CITRATE (PF) 100 MCG/2ML IJ SOLN: 100.0000 ug | Freq: Once | INTRAMUSCULAR | Status: AC

## 2021-07-27 MED ORDER — MEPERIDINE HCL 25 MG/ML IJ SOLN: 6.2500 mg | INTRAMUSCULAR | Status: DC | PRN

## 2021-07-27 MED ORDER — PROMETHAZINE HCL 25 MG/ML IJ SOLN: 6.2500 mg | INTRAMUSCULAR | Status: DC | PRN

## 2021-07-27 MED ORDER — FENTANYL CITRATE (PF) 100 MCG/2ML IJ SOLN
INTRAMUSCULAR | Status: AC
  Filled 2021-07-27: qty 2

## 2021-07-27 SURGICAL SUPPLY — 63 items
BAG COUNTER SPONGE SURGICOUNT (BAG) ×2 IMPLANT
BAG SPNG CNTER NS LX DISP (BAG) ×1
BLADE EXCALIBUR 4.0X13 (MISCELLANEOUS) ×1 IMPLANT
BLADE SURG 15 STRL LF DISP TIS (BLADE) IMPLANT
BLADE SURG 15 STRL SS (BLADE) ×2
BNDG CMPR 9X4 STRL LF SNTH (GAUZE/BANDAGES/DRESSINGS) ×1
BNDG CONFORM 3 STRL LF (GAUZE/BANDAGES/DRESSINGS) ×1 IMPLANT
BNDG ELASTIC 3X5.8 VLCR STR LF (GAUZE/BANDAGES/DRESSINGS) ×2 IMPLANT
BNDG ELASTIC 4X5.8 VLCR STR LF (GAUZE/BANDAGES/DRESSINGS) ×2 IMPLANT
BNDG ESMARK 4X9 LF (GAUZE/BANDAGES/DRESSINGS) ×1 IMPLANT
BNDG GAUZE ELAST 4 BULKY (GAUZE/BANDAGES/DRESSINGS) ×1 IMPLANT
BUR CUDA 2.9 (BURR) ×1 IMPLANT
BUR FULL RADIUS 2.9 (BURR) ×1 IMPLANT
BUR SPHERICAL 2.9 (BURR) ×1 IMPLANT
CABLE BIPOLOR RESECTION CORD (MISCELLANEOUS) ×1 IMPLANT
CNTNR URN SCR LID CUP LEK RST (MISCELLANEOUS) IMPLANT
CONT SPEC 4OZ STRL OR WHT (MISCELLANEOUS) ×4
COVER SURGICAL LIGHT HANDLE (MISCELLANEOUS) ×2 IMPLANT
CUFF TOURN SGL QUICK 18X4 (TOURNIQUET CUFF) ×2 IMPLANT
CUFF TOURN SGL QUICK 24 (TOURNIQUET CUFF)
CUFF TRNQT CYL 24X4X16.5-23 (TOURNIQUET CUFF) IMPLANT
DRAPE C-ARM MINI 42X72 WSTRAPS (DRAPES) ×1 IMPLANT
DRAPE EXTREMITY T 121X128X90 (DISPOSABLE) ×1 IMPLANT
DRAPE IMP U-DRAPE 54X76 (DRAPES) ×1 IMPLANT
DRAPE U-SHAPE 47X51 STRL (DRAPES) ×3 IMPLANT
DRSG EMULSION OIL 3X3 NADH (GAUZE/BANDAGES/DRESSINGS) ×2 IMPLANT
GAUZE SPONGE 4X4 12PLY STRL (GAUZE/BANDAGES/DRESSINGS) ×2 IMPLANT
GAUZE XEROFORM 1X8 LF (GAUZE/BANDAGES/DRESSINGS) ×1 IMPLANT
GLOVE SURG ORTHO LTX SZ8 (GLOVE) ×2 IMPLANT
GLOVE SURG UNDER POLY LF SZ8.5 (GLOVE) ×2 IMPLANT
GOWN STRL REUS W/ TWL LRG LVL3 (GOWN DISPOSABLE) ×3 IMPLANT
GOWN STRL REUS W/TWL LRG LVL3 (GOWN DISPOSABLE) ×6
KIT BASIN OR (CUSTOM PROCEDURE TRAY) ×2 IMPLANT
KIT TURNOVER KIT B (KITS) ×2 IMPLANT
MANIFOLD NEPTUNE II (INSTRUMENTS) ×2 IMPLANT
NDL HYPO 25GX1X1/2 BEV (NEEDLE) ×1 IMPLANT
NEEDLE HYPO 25GX1X1/2 BEV (NEEDLE) IMPLANT
PACK ARTHROSCOPY DSU (CUSTOM PROCEDURE TRAY) ×2 IMPLANT
PAD ARMBOARD 7.5X6 YLW CONV (MISCELLANEOUS) ×3 IMPLANT
PAD CAST 3X4 CTTN HI CHSV (CAST SUPPLIES) IMPLANT
PAD CAST 4YDX4 CTTN HI CHSV (CAST SUPPLIES) IMPLANT
PADDING CAST ABS 4INX4YD NS (CAST SUPPLIES)
PADDING CAST ABS COTTON 4X4 ST (CAST SUPPLIES) ×1 IMPLANT
PADDING CAST COTTON 3X4 STRL (CAST SUPPLIES) ×2
PADDING CAST COTTON 4X4 STRL (CAST SUPPLIES) ×2
SOAP 2 % CHG 4 OZ (WOUND CARE) ×2 IMPLANT
SPECIMEN JAR SMALL (MISCELLANEOUS) ×1 IMPLANT
SPLINT FIBERGLASS 3X35 (CAST SUPPLIES) ×1 IMPLANT
STRIP CLOSURE SKIN 1/2X4 (GAUZE/BANDAGES/DRESSINGS) ×1 IMPLANT
SUT ETHILON 5 0 PS 2 18 (SUTURE) IMPLANT
SUT PROLENE 3 0 PS 1 (SUTURE) IMPLANT
SUT VIC AB 2-0 CT1 27 (SUTURE) ×4
SUT VIC AB 2-0 CT1 TAPERPNT 27 (SUTURE) IMPLANT
SUT VIC AB 3-0 FS2 27 (SUTURE) ×1 IMPLANT
SYR BULB IRRIG 60ML STRL (SYRINGE) ×1 IMPLANT
SYR CONTROL 10ML LL (SYRINGE) ×1 IMPLANT
TOWEL GREEN STERILE (TOWEL DISPOSABLE) ×2 IMPLANT
TOWEL GREEN STERILE FF (TOWEL DISPOSABLE) ×2 IMPLANT
TUBE CONNECTING 12X1/4 (SUCTIONS) ×1 IMPLANT
TUBING ARTHROSCOPY IRRIG 16FT (MISCELLANEOUS) ×2 IMPLANT
UNDERPAD 30X36 HEAVY ABSORB (UNDERPADS AND DIAPERS) ×2 IMPLANT
WATER STERILE IRR 1000ML POUR (IV SOLUTION) ×1 IMPLANT
YANKAUER SUCT BULB TIP NO VENT (SUCTIONS) ×1 IMPLANT

## 2021-07-27 NOTE — Op Note (Signed)
PREOPERATIVE DIAGNOSIS: Left elbow contracture and joint synovitis  POSTOPERATIVE DIAGNOSIS: Same  ATTENDING SURGEON: Dr. Iran Planas who scrubbed and present for the entire procedure  ASSISTANT SURGEON: Gertie Fey who scrubbed in necessary for exposure joint debridement closure and splinting in a timely fashion  ANESTHESIA: Regional with IV sedation  OPERATIVE PROCEDURE: Left elbow capsulectomy and joint release Left elbow arthroscopy biopsy synovectomy and osteophyte excision Radiographs 3 views left elbow  IMPLANTS: None  EBL: Minimal  RADIOGRAPHIC INTERPRETATION: AP lateral views and oblique views of the elbow do show the removal of the large anterior osteophyte with relatively good preservation of the radiocapitellar joint and the ulnohumeral joint narrowing  SURGICAL INDICATIONS: [name] is a right-hand dominant female with persistent left elbow pain.  Patient been refractory to conservative treatment.  Risk benefits and alternatives were discussed in detail with the patient and a signed informed consent was obtained.  Risks include but not limited to bleeding infection damage nearby nerves arteries or tendons loss of motion of the wrist and digits incomplete relief of symptoms and need for further surgical invention.  Signed informed consent was obtained on the day of surgery.  SURGICAL TECHNIQUE: Patient was prepped identified in the preoperative holding area marked apart a marker made the left elbow indicate correct operative site.  Patient brought back operating placed supine on the anesthesia table where the regional anesthetic was administered.  Patient tolerates well.  A well-padded tourniquet placed on the left brachium and stay with the appropriate drape.  Left upper extremities then prepped and draped in normal sterile fashion.  A timeout was called the correct site was identified and the procedure then begun.  Attention was then turned to the left elbow.  A  curvilinear incision made directly over the lateral column.  Dissection carried down through the skin and subcutaneous tissue.  The extensor interval was opened up and the joint was then exposed.  The patient did have a large joint effusion.  Joint effusion was evacuated.  Careful exposure was then done of the lateral column taking it anteriorly and releasing the joint capsule.  Joint capsulotomy was then done across the joint.  Cobb elevators and blunt instrumentation was then used to elevate the joint capsule and release the joint capsule.  This allowed for manipulation of the joint allowing the elbow to come into full extension.  The patient still had the block to full flexion.  Following this using small osteotomes joint debridement was then carried out and bony osteophyte excision was then carried out of the large prominence anteriorly.  This was then taken down to a stable flat surface along the anterior humeral line.  The wound was then thoroughly irrigated.  The joint was evaluated arthroscopically.  The patient did have the bone-on-bone changes noted with the ulnohumeral joint with relatively good preservation of the radiocapitellar joint.  Biopsy of the synovial tissue was then done with small arthroscopic instrumentation.  Synovectomy rongeurs were then also used in order to complete the synovial removal and biopsy.  Synovial tissue was sent in alcohol to test for gout, crystalline arthropathy and sent to pathology for analysis for potential evaluation for inflammatory source.  The patient did have the deposits within the joint which may be an inflammatory source or from the corticosteroid injections.  The wound was then thoroughly irrigated.  After arthroscopic debridement anteriorly and along the posterior recess and lateral gutters there do not appear to be any loose bodies.  The wound was then thoroughly  irrigated.  After arthroscopic evaluation and joint debridement and capsular release the extensor  interval was then closed with 2-0 Vicryl suture.  Subcutaneous tissues closed with 2-0 and 3-0 Vicryl suture.  Skin closed with simple Prolene suture.  Adaptic dressing a sterile compressive bandage then applied.  The patient placed in a long-arm posterior splint.  Tourniquet been deflated with good perfusion of the hand.  Patient was taken to recovery room in good condition.  POSTOPERATIVE PLAN: Patient discharged to home.  See him back in the office in 2 weeks for wound check suture removal down to see our therapist for a long-arm splint and begin eval and treat begin working on gentle range of motion.  Radiographs of the elbow at the first visit.

## 2021-07-27 NOTE — Anesthesia Procedure Notes (Addendum)
Procedure Name: LMA Insertion Date/Time: 07/27/2021 5:12 PM Performed by: Josephine Igo, CRNA Pre-anesthesia Checklist: Patient identified, Emergency Drugs available, Suction available and Patient being monitored Patient Re-evaluated:Patient Re-evaluated prior to induction Oxygen Delivery Method: Circle system utilized Preoxygenation: Pre-oxygenation with 100% oxygen Induction Type: IV induction LMA: LMA inserted LMA Size: 4.0 Placement Confirmation: positive ETCO2 Tube secured with: Tape

## 2021-07-27 NOTE — Anesthesia Postprocedure Evaluation (Signed)
Anesthesia Post Note  Patient: [name]  Procedure(s) Performed: Left elbow synovectomy and joint debridement (Left: Elbow)     Patient location during evaluation: PACU Anesthesia Type: Regional and General Level of consciousness: awake and alert Pain management: pain level controlled Vital Signs Assessment: post-procedure vital signs reviewed and stable Respiratory status: spontaneous breathing, nonlabored ventilation, respiratory function stable and patient connected to nasal cannula oxygen Cardiovascular status: blood pressure returned to baseline and stable Postop Assessment: no apparent nausea or vomiting Anesthetic complications: no   No notable events documented.  Last Vitals:  Vitals:   07/27/21 1915 07/27/21 1930  BP:  (!) 111/48  Pulse: 97 93  Resp: 15 17  Temp:    SpO2: 91% 93%    Last Pain:  Vitals:   07/27/21 1930  TempSrc:   PainSc: [place]

## 2021-07-27 NOTE — Anesthesia Procedure Notes (Signed)
Anesthesia Regional Block: Supraclavicular block   Pre-Anesthetic Checklist: , timeout performed,  Correct Patient, Correct Site, Correct Laterality,  Correct Procedure, Correct Position, site marked,  Risks and benefits discussed,  Surgical consent,  Pre-op evaluation,  At surgeon's request and post-op pain management  Laterality: Upper and Left  Prep: chloraprep       Needles:  Injection technique: Single-shot  Needle Type: Stimiplex          Additional Needles:   Procedures:,,,, ultrasound used (permanent image in chart),,    Narrative:  Start time: 07/27/2021 3:22 PM End time: 07/27/2021 3:42 PM Injection made incrementally with aspirations every 5 mL.  Performed by: Personally  Anesthesiologist: Nolon Nations, MD  Additional Notes: BP cuff, SpO2 and EKG monitors applied. Sedation begun. Nerve location verified with ultrasound. Anesthetic injected incrementally, slowly, and after neg aspirations under direct u/s guidance. Good perineural spread. Tolerated well.

## 2021-07-27 NOTE — Discharge Instructions (Addendum)
KEEP BANDAGE CLEAN AND DRY CALL OFFICE FOR F/U APPT [phone] RX SENT TO [facility] ON [address] - Percocet 10-325mg , Robaxin, and Colace KEEP HAND ELEVATED ABOVE HEART OK TO APPLY ICE TO OPERATIVE AREA CONTACT OFFICE IF ANY WORSENING PAIN OR CONCERNS.

## 2021-07-27 NOTE — Transfer of Care (Signed)
Immediate Anesthesia Transfer of Care Note  Patient: [name]  Procedure(s) Performed: Left elbow synovectomy and joint debridement (Left: Elbow)  Patient Location: PACU  Anesthesia Type:GA combined with regional for post-op pain  Level of Consciousness: awake, alert  and oriented  Airway & Oxygen Therapy: Patient connected to face mask oxygen  Post-op Assessment: Report given to RN and Post -op Vital signs reviewed and stable  Post vital signs: Reviewed and stable  Last Vitals:  Vitals Value Taken Time  BP 124/84   Temp 97.7   Pulse 100 07/27/21 1906  Resp 18 07/27/21 1906  SpO2 93 % 07/27/21 1906  Vitals shown include unvalidated device data.  Last Pain:  Vitals:   07/27/21 1547  TempSrc:   PainSc: 0-No pain         Complications: No notable events documented.

## 2021-07-28 ENCOUNTER — Encounter (HOSPITAL_COMMUNITY): Payer: Self-pay | Admitting: Orthopedic Surgery

## 2021-07-28 DIAGNOSIS — J3081 Allergic rhinitis due to animal (cat) (dog) hair and dander: Secondary | ICD-10-CM | POA: Diagnosis not present

## 2021-07-28 DIAGNOSIS — J301 Allergic rhinitis due to pollen: Secondary | ICD-10-CM | POA: Diagnosis not present

## 2021-07-28 DIAGNOSIS — J3089 Other allergic rhinitis: Secondary | ICD-10-CM | POA: Diagnosis not present

## 2021-07-29 LAB — SURGICAL PATHOLOGY

## 2021-08-02 DIAGNOSIS — J301 Allergic rhinitis due to pollen: Secondary | ICD-10-CM | POA: Diagnosis not present

## 2021-08-02 DIAGNOSIS — J3081 Allergic rhinitis due to animal (cat) (dog) hair and dander: Secondary | ICD-10-CM | POA: Diagnosis not present

## 2021-08-02 DIAGNOSIS — J3089 Other allergic rhinitis: Secondary | ICD-10-CM | POA: Diagnosis not present

## 2021-08-04 ENCOUNTER — Other Ambulatory Visit (HOSPITAL_COMMUNITY): Payer: Self-pay | Admitting: Psychiatry

## 2021-08-04 DIAGNOSIS — F411 Generalized anxiety disorder: Secondary | ICD-10-CM

## 2021-08-04 DIAGNOSIS — F332 Major depressive disorder, recurrent severe without psychotic features: Secondary | ICD-10-CM

## 2021-08-04 DIAGNOSIS — F5105 Insomnia due to other mental disorder: Secondary | ICD-10-CM

## 2021-08-04 DIAGNOSIS — J3089 Other allergic rhinitis: Secondary | ICD-10-CM | POA: Diagnosis not present

## 2021-08-04 DIAGNOSIS — F99 Mental disorder, not otherwise specified: Secondary | ICD-10-CM

## 2021-08-04 DIAGNOSIS — J301 Allergic rhinitis due to pollen: Secondary | ICD-10-CM | POA: Diagnosis not present

## 2021-08-04 DIAGNOSIS — J3081 Allergic rhinitis due to animal (cat) (dog) hair and dander: Secondary | ICD-10-CM | POA: Diagnosis not present

## 2021-08-05 DIAGNOSIS — M25622 Stiffness of left elbow, not elsewhere classified: Secondary | ICD-10-CM | POA: Diagnosis not present

## 2021-08-09 DIAGNOSIS — J3081 Allergic rhinitis due to animal (cat) (dog) hair and dander: Secondary | ICD-10-CM | POA: Diagnosis not present

## 2021-08-09 DIAGNOSIS — J301 Allergic rhinitis due to pollen: Secondary | ICD-10-CM | POA: Diagnosis not present

## 2021-08-09 DIAGNOSIS — J3089 Other allergic rhinitis: Secondary | ICD-10-CM | POA: Diagnosis not present

## 2021-08-11 DIAGNOSIS — Z4789 Encounter for other orthopedic aftercare: Secondary | ICD-10-CM | POA: Diagnosis not present

## 2021-08-11 DIAGNOSIS — J3089 Other allergic rhinitis: Secondary | ICD-10-CM | POA: Diagnosis not present

## 2021-08-11 DIAGNOSIS — M13822 Other specified arthritis, left elbow: Secondary | ICD-10-CM | POA: Diagnosis not present

## 2021-08-11 DIAGNOSIS — J3081 Allergic rhinitis due to animal (cat) (dog) hair and dander: Secondary | ICD-10-CM | POA: Diagnosis not present

## 2021-08-11 DIAGNOSIS — J301 Allergic rhinitis due to pollen: Secondary | ICD-10-CM | POA: Diagnosis not present

## 2021-08-12 DIAGNOSIS — M25522 Pain in left elbow: Secondary | ICD-10-CM | POA: Diagnosis not present

## 2021-08-16 DIAGNOSIS — J3089 Other allergic rhinitis: Secondary | ICD-10-CM | POA: Diagnosis not present

## 2021-08-16 DIAGNOSIS — J301 Allergic rhinitis due to pollen: Secondary | ICD-10-CM | POA: Diagnosis not present

## 2021-08-23 DIAGNOSIS — J3089 Other allergic rhinitis: Secondary | ICD-10-CM | POA: Diagnosis not present

## 2021-08-23 DIAGNOSIS — J3081 Allergic rhinitis due to animal (cat) (dog) hair and dander: Secondary | ICD-10-CM | POA: Diagnosis not present

## 2021-08-23 DIAGNOSIS — J301 Allergic rhinitis due to pollen: Secondary | ICD-10-CM | POA: Diagnosis not present

## 2021-08-24 ENCOUNTER — Other Ambulatory Visit (HOSPITAL_COMMUNITY): Payer: Self-pay | Admitting: Psychiatry

## 2021-08-24 DIAGNOSIS — F5105 Insomnia due to other mental disorder: Secondary | ICD-10-CM

## 2021-08-26 DIAGNOSIS — G4733 Obstructive sleep apnea (adult) (pediatric): Secondary | ICD-10-CM | POA: Diagnosis not present

## 2021-08-29 DIAGNOSIS — I10 Essential (primary) hypertension: Secondary | ICD-10-CM | POA: Diagnosis not present

## 2021-08-29 DIAGNOSIS — E1165 Type 2 diabetes mellitus with hyperglycemia: Secondary | ICD-10-CM | POA: Diagnosis not present

## 2021-08-29 DIAGNOSIS — E7800 Pure hypercholesterolemia, unspecified: Secondary | ICD-10-CM | POA: Diagnosis not present

## 2021-08-29 DIAGNOSIS — E039 Hypothyroidism, unspecified: Secondary | ICD-10-CM | POA: Diagnosis not present

## 2021-08-30 DIAGNOSIS — J3081 Allergic rhinitis due to animal (cat) (dog) hair and dander: Secondary | ICD-10-CM | POA: Diagnosis not present

## 2021-08-30 DIAGNOSIS — J3089 Other allergic rhinitis: Secondary | ICD-10-CM | POA: Diagnosis not present

## 2021-08-30 DIAGNOSIS — J301 Allergic rhinitis due to pollen: Secondary | ICD-10-CM | POA: Diagnosis not present

## 2021-08-31 DIAGNOSIS — J301 Allergic rhinitis due to pollen: Secondary | ICD-10-CM | POA: Diagnosis not present

## 2021-08-31 DIAGNOSIS — J3081 Allergic rhinitis due to animal (cat) (dog) hair and dander: Secondary | ICD-10-CM | POA: Diagnosis not present

## 2021-08-31 DIAGNOSIS — J3089 Other allergic rhinitis: Secondary | ICD-10-CM | POA: Diagnosis not present

## 2021-09-06 ENCOUNTER — Other Ambulatory Visit (HOSPITAL_COMMUNITY): Payer: Self-pay | Admitting: Psychiatry

## 2021-09-06 DIAGNOSIS — F5105 Insomnia due to other mental disorder: Secondary | ICD-10-CM

## 2021-09-08 DIAGNOSIS — J3081 Allergic rhinitis due to animal (cat) (dog) hair and dander: Secondary | ICD-10-CM | POA: Diagnosis not present

## 2021-09-08 DIAGNOSIS — J3089 Other allergic rhinitis: Secondary | ICD-10-CM | POA: Diagnosis not present

## 2021-09-08 DIAGNOSIS — J301 Allergic rhinitis due to pollen: Secondary | ICD-10-CM | POA: Diagnosis not present

## 2021-09-13 DIAGNOSIS — J301 Allergic rhinitis due to pollen: Secondary | ICD-10-CM | POA: Diagnosis not present

## 2021-09-13 DIAGNOSIS — J3081 Allergic rhinitis due to animal (cat) (dog) hair and dander: Secondary | ICD-10-CM | POA: Diagnosis not present

## 2021-09-13 DIAGNOSIS — J3089 Other allergic rhinitis: Secondary | ICD-10-CM | POA: Diagnosis not present

## 2021-09-21 DIAGNOSIS — J3089 Other allergic rhinitis: Secondary | ICD-10-CM | POA: Diagnosis not present

## 2021-09-21 DIAGNOSIS — J301 Allergic rhinitis due to pollen: Secondary | ICD-10-CM | POA: Diagnosis not present

## 2021-09-21 DIAGNOSIS — J3081 Allergic rhinitis due to animal (cat) (dog) hair and dander: Secondary | ICD-10-CM | POA: Diagnosis not present

## 2021-09-27 DIAGNOSIS — J3089 Other allergic rhinitis: Secondary | ICD-10-CM | POA: Diagnosis not present

## 2021-09-27 DIAGNOSIS — J301 Allergic rhinitis due to pollen: Secondary | ICD-10-CM | POA: Diagnosis not present

## 2021-09-27 DIAGNOSIS — J3081 Allergic rhinitis due to animal (cat) (dog) hair and dander: Secondary | ICD-10-CM | POA: Diagnosis not present

## 2021-09-28 DIAGNOSIS — E1169 Type 2 diabetes mellitus with other specified complication: Secondary | ICD-10-CM | POA: Diagnosis not present

## 2021-09-28 DIAGNOSIS — E7800 Pure hypercholesterolemia, unspecified: Secondary | ICD-10-CM | POA: Diagnosis not present

## 2021-09-28 DIAGNOSIS — E039 Hypothyroidism, unspecified: Secondary | ICD-10-CM | POA: Diagnosis not present

## 2021-09-28 DIAGNOSIS — I10 Essential (primary) hypertension: Secondary | ICD-10-CM | POA: Diagnosis not present

## 2021-10-03 DIAGNOSIS — H40013 Open angle with borderline findings, low risk, bilateral: Secondary | ICD-10-CM | POA: Diagnosis not present

## 2021-10-03 DIAGNOSIS — H26493 Other secondary cataract, bilateral: Secondary | ICD-10-CM | POA: Diagnosis not present

## 2021-10-03 DIAGNOSIS — D492 Neoplasm of unspecified behavior of bone, soft tissue, and skin: Secondary | ICD-10-CM | POA: Diagnosis not present

## 2021-10-03 DIAGNOSIS — H5212 Myopia, left eye: Secondary | ICD-10-CM | POA: Diagnosis not present

## 2021-10-03 DIAGNOSIS — H16223 Keratoconjunctivitis sicca, not specified as Sjogren's, bilateral: Secondary | ICD-10-CM | POA: Diagnosis not present

## 2021-10-03 DIAGNOSIS — H52222 Regular astigmatism, left eye: Secondary | ICD-10-CM | POA: Diagnosis not present

## 2021-10-03 DIAGNOSIS — E119 Type 2 diabetes mellitus without complications: Secondary | ICD-10-CM | POA: Diagnosis not present

## 2021-10-03 DIAGNOSIS — H524 Presbyopia: Secondary | ICD-10-CM | POA: Diagnosis not present

## 2021-10-03 DIAGNOSIS — H5201 Hypermetropia, right eye: Secondary | ICD-10-CM | POA: Diagnosis not present

## 2021-10-04 DIAGNOSIS — J3089 Other allergic rhinitis: Secondary | ICD-10-CM | POA: Diagnosis not present

## 2021-10-04 DIAGNOSIS — J3081 Allergic rhinitis due to animal (cat) (dog) hair and dander: Secondary | ICD-10-CM | POA: Diagnosis not present

## 2021-10-04 DIAGNOSIS — J301 Allergic rhinitis due to pollen: Secondary | ICD-10-CM | POA: Diagnosis not present

## 2021-10-11 DIAGNOSIS — J3081 Allergic rhinitis due to animal (cat) (dog) hair and dander: Secondary | ICD-10-CM | POA: Diagnosis not present

## 2021-10-11 DIAGNOSIS — J301 Allergic rhinitis due to pollen: Secondary | ICD-10-CM | POA: Diagnosis not present

## 2021-10-11 DIAGNOSIS — J3089 Other allergic rhinitis: Secondary | ICD-10-CM | POA: Diagnosis not present

## 2021-10-11 DIAGNOSIS — J309 Allergic rhinitis, unspecified: Secondary | ICD-10-CM | POA: Diagnosis not present

## 2021-10-13 DIAGNOSIS — D2310 Other benign neoplasm of skin of unspecified eyelid, including canthus: Secondary | ICD-10-CM | POA: Diagnosis not present

## 2021-10-14 DIAGNOSIS — D492 Neoplasm of unspecified behavior of bone, soft tissue, and skin: Secondary | ICD-10-CM | POA: Diagnosis not present

## 2021-10-15 ENCOUNTER — Encounter (HOSPITAL_COMMUNITY): Payer: Self-pay | Admitting: Emergency Medicine

## 2021-10-15 ENCOUNTER — Emergency Department (HOSPITAL_COMMUNITY): Payer: Medicare HMO

## 2021-10-15 ENCOUNTER — Other Ambulatory Visit: Payer: Self-pay

## 2021-10-15 ENCOUNTER — Emergency Department (HOSPITAL_COMMUNITY)
Admission: EM | Admit: 2021-10-15 | Discharge: 2021-10-15 | Disposition: A | Discharge status: Home / Self Care | Payer: Medicare HMO | Attending: Emergency Medicine | Admitting: Emergency Medicine

## 2021-10-15 DIAGNOSIS — R072 Precordial pain: Secondary | ICD-10-CM | POA: Diagnosis not present

## 2021-10-15 DIAGNOSIS — R079 Chest pain, unspecified: Secondary | ICD-10-CM | POA: Diagnosis not present

## 2021-10-15 DIAGNOSIS — R0789 Other chest pain: Secondary | ICD-10-CM | POA: Diagnosis not present

## 2021-10-15 LAB — BASIC METABOLIC PANEL
Anion gap: 9 (ref 5–15)
BUN: 30 mg/dL — ABNORMAL HIGH (ref 6–20)
CO2: 28 mmol/L (ref 22–32)
Calcium: 9.7 mg/dL (ref 8.9–10.3)
Chloride: 103 mmol/L (ref 98–111)
Creatinine, Ser: 0.93 mg/dL (ref 0.44–1.00)
GFR, Estimated: >60 mL/min (ref >60)
Glucose, Bld: 133 mg/dL — ABNORMAL HIGH (ref 70–99)
Potassium: 4 mmol/L (ref 3.5–5.1)
Sodium: 140 mmol/L (ref 135–145)

## 2021-10-15 LAB — CBC
HCT: 41.4 % (ref 36.0–46.0)
Hemoglobin: 13.6 g/dL (ref 12.0–15.0)
MCH: 29.3 pg (ref 26.0–34.0)
MCHC: 32.9 g/dL (ref 30.0–36.0)
MCV: 89.2 fL (ref 80.0–100.0)
Platelets: 222 10*3/uL (ref 150–400)
RBC: 4.64 MIL/uL (ref 3.87–5.11)
RDW: 14.6 % (ref 11.5–15.5)
WBC: 15.4 10*3/uL — ABNORMAL HIGH (ref 4.0–10.5)
nRBC: 0 % (ref 0.0–0.2)

## 2021-10-15 LAB — TROPONIN I (HIGH SENSITIVITY)
Troponin I (High Sensitivity): 3 ng/L (ref <18)
Troponin I (High Sensitivity): 2 ng/L (ref <18)

## 2021-10-15 LAB — I-STAT BETA HCG BLOOD, ED (MC, WL, AP ONLY): I-stat hCG, quantitative: 6.5 m[IU]/mL — ABNORMAL HIGH (ref <5)

## 2021-10-15 LAB — URINALYSIS, ROUTINE W REFLEX MICROSCOPIC
Bilirubin Urine: NEGATIVE
Glucose, UA: NEGATIVE mg/dL
Hgb urine dipstick: NEGATIVE
Ketones, ur: NEGATIVE mg/dL
Leukocytes,Ua: NEGATIVE
Nitrite: NEGATIVE
Protein, ur: NEGATIVE mg/dL
Specific Gravity, Urine: 1.009 (ref 1.005–1.030)
pH: 6 (ref 5.0–8.0)

## 2021-10-15 MED ORDER — TRAMADOL HCL 50 MG PO TABS
50.0000 mg | ORAL_TABLET | Freq: Once | ORAL | Status: AC
  Administered 2021-10-15: 50 mg via ORAL
  Filled 2021-10-15: qty 1

## 2021-10-15 NOTE — ED Provider Notes (Signed)
?[facility] ?Provider Note ? ? ?CSN: [id number] ?Arrival date & time: 10/15/21  1622 ? ?  ? ?History ? ?Chief Complaint  ?Patient presents with  ? Chest Pain  ? ? ?[name] is a 56 y.o. female. ? ?Pt c/o mid to left chest pain at rest today. Symptoms onset this AM, moderate, dull, non radiating. No associated sob, nv or diaphoresis. Symptoms are not pleuritic. No cough or uri symptoms. No fever or chills. No heartburn. +hx gerd. Denies chest wall injury or strain. No other recent cp even w exertion. No sob or unusual doe. No abd pain or nv. No leg pain or swelling. Denies hx mi or significant cad. No hx dvt or pe.  ? ?The history is provided by the patient, medical records and a relative.  ?Chest Pain ?Associated symptoms: no abdominal pain, no back pain, no cough, no fever, no headache, no shortness of breath and no vomiting   ? ?  ? ?Home Medications ?Prior to Admission medications   ?Medication Sig Start Date End Date Taking? Authorizing Provider  ?acetaminophen (TYLENOL) 650 MG CR tablet Take 650 mg by mouth every 8 (eight) hours as needed for pain.    [provider]  ?Ascorbic Acid (VITAMIN C) 1000 MG tablet Take 1,000 mg by mouth at bedtime.     [provider]  ?atorvastatin (LIPITOR) 10 MG tablet Take 10 mg by mouth at bedtime.     [provider]  ?COVID-19 mRNA bivalent vaccine, Pfizer, (PFIZER COVID-19 VAC BIVALENT) injection Inject into the muscle. ?Patient not taking: Reported on 07/20/2021 05/17/21   Carlyle Basques, MD  ?cyclobenzaprine (FLEXERIL) 10 MG tablet Take 20 mg by mouth every 8 (eight) hours as needed for muscle spasms.    [provider]  ?docusate sodium (COLACE) 100 MG capsule Take 1 capsule (100 mg total) by mouth daily as needed. 07/27/21 07/27/22  Brynda Peon, PA  ?DULoxetine (CYMBALTA) 60 MG capsule Take 1 capsule (60 mg total) by mouth 2 (two) times daily. 03/03/21 07/20/21  Charlcie Cradle, MD   ?EPINEPHrine 0.3 mg/0.3 mL IJ SOAJ injection Inject 0.3 mg into the muscle as needed for anaphylaxis.    [provider]  ?Eszopiclone 3 MG TABS Take 1 tablet (3 mg total) by mouth at bedtime as needed. Take immediately before bedtime 03/03/21   Charlcie Cradle, MD  ?fluticasone [facility]) 50 MCG/ACT nasal spray Place 2 sprays into both nostrils daily.    [provider]  ?furosemide (LASIX) 20 MG tablet Take 60 mg by mouth daily.     [provider]  ?indomethacin (INDOCIN) 25 MG capsule Take 25 mg by mouth in the morning and at bedtime. 04/06/18   [provider]  ?influenza vac split quadrivalent PF (FLUARIX) 0.5 ML injection Inject into the muscle. ?Patient not taking: Reported on 07/20/2021 05/17/21   Carlyle Basques, MD  ?Insulin Glargine [facility] KWIKPEN) 100 UNIT/ML SOPN Inject 78 Units into the skin daily. takes in am    [provider]  ?levothyroxine (SYNTHROID) 137 MCG tablet Take 137 mcg by mouth daily before breakfast.    [provider]  ?lisinopril (PRINIVIL,ZESTRIL) 20 MG tablet Take 20 mg by mouth daily.     [provider]  ?metoprolol tartrate (LOPRESSOR) 100 MG tablet Take 1 tablet (100 mg total) by mouth once for 1 dose. TAKE ONE TABLET 2 HOURS PRIOR TO YOUR CARDIAC CT ?Patient not taking: Reported on 05/06/2020 05/13/19 05/22/19  Gwenlyn Found,  Pearletha Forge, MD  ?mirtazapine (REMERON) 30 MG tablet Take 2 tablets (60 mg total) by mouth at bedtime. 03/03/21   Charlcie Cradle, MD  ?montelukast (SINGULAIR) 10 MG tablet Take 10 mg by mouth daily. 07/06/21   [provider]  ?omeprazole (PRILOSEC) 40 MG capsule Take 40 mg by mouth at bedtime. 05/06/19   [provider]  ?oxybutynin (DITROPAN-XL) 10 MG 24 hr tablet Take 10 mg by mouth 2 (two) times daily.     [provider]  ?vitamin B-12 (CYANOCOBALAMIN) 100 MCG tablet Take 200 mcg by mouth daily.    [provider]  ?   ? ?Allergies    ?Prednisolone, Prednisone, and  Vicodin [hydrocodone-acetaminophen]   ? ?Review of Systems   ?Review of Systems  ?Constitutional:  Negative for chills and fever.  ?HENT:  Negative for sore throat.   ?Eyes:  Negative for redness.  ?Respiratory:  Negative for cough and shortness of breath.   ?Cardiovascular:  Positive for chest pain. Negative for leg swelling.  ?Gastrointestinal:  Negative for abdominal pain, diarrhea and vomiting.  ?Genitourinary:  Negative for dysuria, flank pain and hematuria.  ?Musculoskeletal:  Negative for back pain and neck pain.  ?Skin:  Negative for rash.  ?Neurological:  Negative for headaches.  ?Hematological:  Does not bruise/bleed easily.  ?Psychiatric/Behavioral:  Negative for confusion.   ? ?Physical Exam ?Updated Vital Signs ?BP 117/66   Pulse 70   Temp 97.6 ?F (36.4 ?C) (Oral)   Resp 20   SpO2 100%  ?Physical Exam ?Vitals and nursing note reviewed.  ?Constitutional:   ?   Appearance: Normal appearance. She is well-developed.  ?HENT:  ?   Head: Atraumatic.  ?   Nose: Nose normal.  ?   Mouth/Throat:  ?   Mouth: Mucous membranes are moist.  ?Eyes:  ?   General: No scleral icterus. ?   Conjunctiva/sclera: Conjunctivae normal.  ?Neck:  ?   Trachea: No tracheal deviation.  ?Cardiovascular:  ?   Rate and Rhythm: Normal rate and regular rhythm.  ?   Pulses: Normal pulses.  ?   Heart sounds: Normal heart sounds. No murmur heard. ?  No friction rub. No gallop.  ?Pulmonary:  ?   Effort: Pulmonary effort is normal. No respiratory distress.  ?   Breath sounds: Normal breath sounds.  ?Abdominal:  ?   General: Bowel sounds are normal. There is no distension.  ?   Palpations: Abdomen is soft. There is no mass.  ?   Tenderness: There is no abdominal tenderness. There is no guarding or rebound.  ?   Hernia: No hernia is present.  ?Genitourinary: ?   Comments: No cva tenderness.  ?Musculoskeletal:     ?   General: No swelling or tenderness.  ?   Cervical back: Normal range of motion and neck supple. No rigidity. No muscular  tenderness.  ?   Right lower leg: No edema.  ?   Left lower leg: No edema.  ?Skin: ?   General: Skin is warm and dry.  ?   Findings: No rash.  ?Neurological:  ?   Mental Status: She is alert.  ?   Comments: Alert, speech normal. Motor/sens grossly intact bil.   ?Psychiatric:     ?   Mood and Affect: Mood normal.  ? ? ?ED Results / Procedures / Treatments   ?Labs ?(all labs ordered are listed, but only abnormal results are displayed) ?Results for orders placed or performed during the hospital encounter  of 10/15/21  ?Basic metabolic panel  ?Result Value Ref Range  ? Sodium 140 135 - 145 mmol/L  ? Potassium 4.0 3.5 - 5.1 mmol/L  ? Chloride 103 98 - 111 mmol/L  ? CO2 28 22 - 32 mmol/L  ? Glucose, Bld 133 (H) 70 - 99 mg/dL  ? BUN 30 (H) 6 - 20 mg/dL  ? Creatinine, Ser 0.93 0.44 - 1.00 mg/dL  ? Calcium 9.7 8.9 - 10.3 mg/dL  ? GFR, Estimated >60 >60 mL/min  ? Anion gap 9 5 - 15  ?CBC  ?Result Value Ref Range  ? WBC 15.4 (H) 4.0 - 10.5 K/uL  ? RBC 4.64 3.87 - 5.11 MIL/uL  ? Hemoglobin 13.6 12.0 - 15.0 g/dL  ? HCT 41.4 36.0 - 46.0 %  ? MCV 89.2 80.0 - 100.0 fL  ? MCH 29.3 26.0 - 34.0 pg  ? MCHC 32.9 30.0 - 36.0 g/dL  ? RDW 14.6 11.5 - 15.5 %  ? Platelets 222 150 - 400 K/uL  ? nRBC 0.0 0.0 - 0.2 %  ?Urinalysis, Routine w reflex microscopic  ?Result Value Ref Range  ? Color, Urine STRAW (A) YELLOW  ? APPearance CLEAR CLEAR  ? Specific Gravity, Urine 1.009 1.005 - 1.030  ? pH 6.0 5.0 - 8.0  ? Glucose, UA NEGATIVE NEGATIVE mg/dL  ? Hgb urine dipstick NEGATIVE NEGATIVE  ? Bilirubin Urine NEGATIVE NEGATIVE  ? Ketones, ur NEGATIVE NEGATIVE mg/dL  ? Protein, ur NEGATIVE NEGATIVE mg/dL  ? Nitrite NEGATIVE NEGATIVE  ? Leukocytes,Ua NEGATIVE NEGATIVE  ?I-Stat beta hCG blood, ED  ?Result Value Ref Range  ? I-stat hCG, quantitative 6.5 (H) <5 mIU/mL  ? Comment 3          ?Troponin I (High Sensitivity)  ?Result Value Ref Range  ? Troponin I (High Sensitivity) 3 <18 ng/L  ?Troponin I (High Sensitivity)  ?Result Value Ref Range  ? Troponin  I (High Sensitivity) 2 <18 ng/L  ? ?DG Chest 2 View ? ?Result Date: 10/15/2021 ?CLINICAL DATA:  Left-sided chest pain. EXAM: CHEST - 2 VIEW COMPARISON:  Chest radiograph 03/29/2021 FINDINGS: The heart size and media

## 2021-10-15 NOTE — Discharge Instructions (Addendum)
It was our pleasure to provide your ER care today - we hope that you feel better.  Overall, your heart tests in the ER look good.  ? ?Take acetaminophen as need. If reflux symptoms, try pepcid and maalox for symptom relief. ? ?Follow up with primary care doctor and cardiology in one week -  call office Monday AM to arrange follow up. ? ?Return to ER if worse, new symptoms, fevers, new/severe pain, recurrent or persistent chest pain, increased trouble breathing, weak/fainting, or other concern.  ? ?You were given pain medication in the ER - no driving for the next 6 hours.  ?

## 2021-10-15 NOTE — ED Notes (Signed)
Pt provided water and crackers.  

## 2021-10-15 NOTE — ED Notes (Signed)
Pt ambulated to restroom without assistance.

## 2021-10-15 NOTE — ED Triage Notes (Signed)
C/o L sided chest pain under L breast that radiates to back since 11am with headache and feeling lightheaded.  Denies nausea and vomiting. ?

## 2021-10-18 DIAGNOSIS — J3081 Allergic rhinitis due to animal (cat) (dog) hair and dander: Secondary | ICD-10-CM | POA: Diagnosis not present

## 2021-10-18 DIAGNOSIS — J301 Allergic rhinitis due to pollen: Secondary | ICD-10-CM | POA: Diagnosis not present

## 2021-10-18 DIAGNOSIS — J3089 Other allergic rhinitis: Secondary | ICD-10-CM | POA: Diagnosis not present

## 2021-10-25 DIAGNOSIS — J3089 Other allergic rhinitis: Secondary | ICD-10-CM | POA: Diagnosis not present

## 2021-10-25 DIAGNOSIS — J301 Allergic rhinitis due to pollen: Secondary | ICD-10-CM | POA: Diagnosis not present

## 2021-10-25 DIAGNOSIS — J3081 Allergic rhinitis due to animal (cat) (dog) hair and dander: Secondary | ICD-10-CM | POA: Diagnosis not present

## 2021-11-01 DIAGNOSIS — J301 Allergic rhinitis due to pollen: Secondary | ICD-10-CM | POA: Diagnosis not present

## 2021-11-01 DIAGNOSIS — J3081 Allergic rhinitis due to animal (cat) (dog) hair and dander: Secondary | ICD-10-CM | POA: Diagnosis not present

## 2021-11-01 DIAGNOSIS — J3089 Other allergic rhinitis: Secondary | ICD-10-CM | POA: Diagnosis not present

## 2021-11-03 DIAGNOSIS — N3941 Urge incontinence: Secondary | ICD-10-CM | POA: Diagnosis not present

## 2021-11-03 DIAGNOSIS — R351 Nocturia: Secondary | ICD-10-CM | POA: Diagnosis not present

## 2021-11-03 DIAGNOSIS — R350 Frequency of micturition: Secondary | ICD-10-CM | POA: Diagnosis not present

## 2021-11-04 DIAGNOSIS — G4733 Obstructive sleep apnea (adult) (pediatric): Secondary | ICD-10-CM | POA: Diagnosis not present

## 2021-11-08 DIAGNOSIS — J3081 Allergic rhinitis due to animal (cat) (dog) hair and dander: Secondary | ICD-10-CM | POA: Diagnosis not present

## 2021-11-08 DIAGNOSIS — J301 Allergic rhinitis due to pollen: Secondary | ICD-10-CM | POA: Diagnosis not present

## 2021-11-08 DIAGNOSIS — J3089 Other allergic rhinitis: Secondary | ICD-10-CM | POA: Diagnosis not present

## 2021-11-15 DIAGNOSIS — J301 Allergic rhinitis due to pollen: Secondary | ICD-10-CM | POA: Diagnosis not present

## 2021-11-15 DIAGNOSIS — J3081 Allergic rhinitis due to animal (cat) (dog) hair and dander: Secondary | ICD-10-CM | POA: Diagnosis not present

## 2021-11-15 DIAGNOSIS — J3089 Other allergic rhinitis: Secondary | ICD-10-CM | POA: Diagnosis not present

## 2021-11-17 DIAGNOSIS — G4700 Insomnia, unspecified: Secondary | ICD-10-CM | POA: Diagnosis not present

## 2021-11-17 DIAGNOSIS — Z794 Long term (current) use of insulin: Secondary | ICD-10-CM | POA: Diagnosis not present

## 2021-11-17 DIAGNOSIS — I10 Essential (primary) hypertension: Secondary | ICD-10-CM | POA: Diagnosis not present

## 2021-11-17 DIAGNOSIS — M25569 Pain in unspecified knee: Secondary | ICD-10-CM | POA: Diagnosis not present

## 2021-11-17 DIAGNOSIS — Z1331 Encounter for screening for depression: Secondary | ICD-10-CM | POA: Diagnosis not present

## 2021-11-17 DIAGNOSIS — E039 Hypothyroidism, unspecified: Secondary | ICD-10-CM | POA: Diagnosis not present

## 2021-11-17 DIAGNOSIS — R609 Edema, unspecified: Secondary | ICD-10-CM | POA: Diagnosis not present

## 2021-11-17 DIAGNOSIS — Z6841 Body Mass Index (BMI) 40.0 and over, adult: Secondary | ICD-10-CM | POA: Diagnosis not present

## 2021-11-17 DIAGNOSIS — Z0000 Encounter for general adult medical examination without abnormal findings: Secondary | ICD-10-CM | POA: Diagnosis not present

## 2021-11-17 DIAGNOSIS — E1169 Type 2 diabetes mellitus with other specified complication: Secondary | ICD-10-CM | POA: Diagnosis not present

## 2021-11-17 DIAGNOSIS — E7800 Pure hypercholesterolemia, unspecified: Secondary | ICD-10-CM | POA: Diagnosis not present

## 2021-11-22 DIAGNOSIS — J3089 Other allergic rhinitis: Secondary | ICD-10-CM | POA: Diagnosis not present

## 2021-11-22 DIAGNOSIS — J301 Allergic rhinitis due to pollen: Secondary | ICD-10-CM | POA: Diagnosis not present

## 2021-11-22 DIAGNOSIS — J3081 Allergic rhinitis due to animal (cat) (dog) hair and dander: Secondary | ICD-10-CM | POA: Diagnosis not present

## 2021-11-23 DIAGNOSIS — R69 Illness, unspecified: Secondary | ICD-10-CM | POA: Diagnosis not present

## 2021-11-23 DIAGNOSIS — E1169 Type 2 diabetes mellitus with other specified complication: Secondary | ICD-10-CM | POA: Diagnosis not present

## 2021-11-23 DIAGNOSIS — G4700 Insomnia, unspecified: Secondary | ICD-10-CM | POA: Diagnosis not present

## 2021-11-23 DIAGNOSIS — E7800 Pure hypercholesterolemia, unspecified: Secondary | ICD-10-CM | POA: Diagnosis not present

## 2021-11-23 DIAGNOSIS — I10 Essential (primary) hypertension: Secondary | ICD-10-CM | POA: Diagnosis not present

## 2021-11-23 DIAGNOSIS — E039 Hypothyroidism, unspecified: Secondary | ICD-10-CM | POA: Diagnosis not present

## 2021-11-23 DIAGNOSIS — M1990 Unspecified osteoarthritis, unspecified site: Secondary | ICD-10-CM | POA: Diagnosis not present

## 2021-11-29 DIAGNOSIS — J301 Allergic rhinitis due to pollen: Secondary | ICD-10-CM | POA: Diagnosis not present

## 2021-11-29 DIAGNOSIS — J3089 Other allergic rhinitis: Secondary | ICD-10-CM | POA: Diagnosis not present

## 2021-11-29 DIAGNOSIS — J3081 Allergic rhinitis due to animal (cat) (dog) hair and dander: Secondary | ICD-10-CM | POA: Diagnosis not present

## 2021-12-04 DIAGNOSIS — G4733 Obstructive sleep apnea (adult) (pediatric): Secondary | ICD-10-CM | POA: Diagnosis not present

## 2021-12-06 DIAGNOSIS — J301 Allergic rhinitis due to pollen: Secondary | ICD-10-CM | POA: Diagnosis not present

## 2021-12-06 DIAGNOSIS — J3089 Other allergic rhinitis: Secondary | ICD-10-CM | POA: Diagnosis not present

## 2021-12-06 DIAGNOSIS — J3081 Allergic rhinitis due to animal (cat) (dog) hair and dander: Secondary | ICD-10-CM | POA: Diagnosis not present

## 2021-12-13 DIAGNOSIS — J3089 Other allergic rhinitis: Secondary | ICD-10-CM | POA: Diagnosis not present

## 2021-12-13 DIAGNOSIS — J3081 Allergic rhinitis due to animal (cat) (dog) hair and dander: Secondary | ICD-10-CM | POA: Diagnosis not present

## 2021-12-13 DIAGNOSIS — J301 Allergic rhinitis due to pollen: Secondary | ICD-10-CM | POA: Diagnosis not present

## 2021-12-20 DIAGNOSIS — J301 Allergic rhinitis due to pollen: Secondary | ICD-10-CM | POA: Diagnosis not present

## 2021-12-20 DIAGNOSIS — J3081 Allergic rhinitis due to animal (cat) (dog) hair and dander: Secondary | ICD-10-CM | POA: Diagnosis not present

## 2021-12-20 DIAGNOSIS — J3089 Other allergic rhinitis: Secondary | ICD-10-CM | POA: Diagnosis not present

## 2021-12-26 DIAGNOSIS — I10 Essential (primary) hypertension: Secondary | ICD-10-CM | POA: Diagnosis not present

## 2021-12-26 DIAGNOSIS — E7800 Pure hypercholesterolemia, unspecified: Secondary | ICD-10-CM | POA: Diagnosis not present

## 2021-12-26 DIAGNOSIS — E039 Hypothyroidism, unspecified: Secondary | ICD-10-CM | POA: Diagnosis not present

## 2021-12-26 DIAGNOSIS — E1165 Type 2 diabetes mellitus with hyperglycemia: Secondary | ICD-10-CM | POA: Diagnosis not present

## 2021-12-27 DIAGNOSIS — J301 Allergic rhinitis due to pollen: Secondary | ICD-10-CM | POA: Diagnosis not present

## 2021-12-27 DIAGNOSIS — J3089 Other allergic rhinitis: Secondary | ICD-10-CM | POA: Diagnosis not present

## 2021-12-27 DIAGNOSIS — J3081 Allergic rhinitis due to animal (cat) (dog) hair and dander: Secondary | ICD-10-CM | POA: Diagnosis not present

## 2022-01-03 DIAGNOSIS — J3081 Allergic rhinitis due to animal (cat) (dog) hair and dander: Secondary | ICD-10-CM | POA: Diagnosis not present

## 2022-01-03 DIAGNOSIS — J3089 Other allergic rhinitis: Secondary | ICD-10-CM | POA: Diagnosis not present

## 2022-01-03 DIAGNOSIS — J301 Allergic rhinitis due to pollen: Secondary | ICD-10-CM | POA: Diagnosis not present

## 2022-01-10 DIAGNOSIS — J3089 Other allergic rhinitis: Secondary | ICD-10-CM | POA: Diagnosis not present

## 2022-01-10 DIAGNOSIS — J3081 Allergic rhinitis due to animal (cat) (dog) hair and dander: Secondary | ICD-10-CM | POA: Diagnosis not present

## 2022-01-10 DIAGNOSIS — J301 Allergic rhinitis due to pollen: Secondary | ICD-10-CM | POA: Diagnosis not present

## 2022-01-18 DIAGNOSIS — J3081 Allergic rhinitis due to animal (cat) (dog) hair and dander: Secondary | ICD-10-CM | POA: Diagnosis not present

## 2022-01-18 DIAGNOSIS — J3089 Other allergic rhinitis: Secondary | ICD-10-CM | POA: Diagnosis not present

## 2022-01-18 DIAGNOSIS — J301 Allergic rhinitis due to pollen: Secondary | ICD-10-CM | POA: Diagnosis not present

## 2022-01-24 DIAGNOSIS — J3081 Allergic rhinitis due to animal (cat) (dog) hair and dander: Secondary | ICD-10-CM | POA: Diagnosis not present

## 2022-01-24 DIAGNOSIS — J301 Allergic rhinitis due to pollen: Secondary | ICD-10-CM | POA: Diagnosis not present

## 2022-01-24 DIAGNOSIS — J3089 Other allergic rhinitis: Secondary | ICD-10-CM | POA: Diagnosis not present

## 2022-01-27 DIAGNOSIS — E039 Hypothyroidism, unspecified: Secondary | ICD-10-CM | POA: Diagnosis not present

## 2022-01-27 DIAGNOSIS — E7800 Pure hypercholesterolemia, unspecified: Secondary | ICD-10-CM | POA: Diagnosis not present

## 2022-01-27 DIAGNOSIS — M1990 Unspecified osteoarthritis, unspecified site: Secondary | ICD-10-CM | POA: Diagnosis not present

## 2022-01-27 DIAGNOSIS — E1165 Type 2 diabetes mellitus with hyperglycemia: Secondary | ICD-10-CM | POA: Diagnosis not present

## 2022-01-27 DIAGNOSIS — I10 Essential (primary) hypertension: Secondary | ICD-10-CM | POA: Diagnosis not present

## 2022-01-27 DIAGNOSIS — R69 Illness, unspecified: Secondary | ICD-10-CM | POA: Diagnosis not present

## 2022-01-31 DIAGNOSIS — J301 Allergic rhinitis due to pollen: Secondary | ICD-10-CM | POA: Diagnosis not present

## 2022-01-31 DIAGNOSIS — J3089 Other allergic rhinitis: Secondary | ICD-10-CM | POA: Diagnosis not present

## 2022-01-31 DIAGNOSIS — J3081 Allergic rhinitis due to animal (cat) (dog) hair and dander: Secondary | ICD-10-CM | POA: Diagnosis not present

## 2022-02-06 DIAGNOSIS — J301 Allergic rhinitis due to pollen: Secondary | ICD-10-CM | POA: Diagnosis not present

## 2022-02-06 DIAGNOSIS — J3081 Allergic rhinitis due to animal (cat) (dog) hair and dander: Secondary | ICD-10-CM | POA: Diagnosis not present

## 2022-02-06 DIAGNOSIS — J3089 Other allergic rhinitis: Secondary | ICD-10-CM | POA: Diagnosis not present

## 2022-02-14 DIAGNOSIS — J3089 Other allergic rhinitis: Secondary | ICD-10-CM | POA: Diagnosis not present

## 2022-02-14 DIAGNOSIS — J301 Allergic rhinitis due to pollen: Secondary | ICD-10-CM | POA: Diagnosis not present

## 2022-02-14 DIAGNOSIS — J3081 Allergic rhinitis due to animal (cat) (dog) hair and dander: Secondary | ICD-10-CM | POA: Diagnosis not present

## 2022-02-21 DIAGNOSIS — J3081 Allergic rhinitis due to animal (cat) (dog) hair and dander: Secondary | ICD-10-CM | POA: Diagnosis not present

## 2022-02-21 DIAGNOSIS — J3089 Other allergic rhinitis: Secondary | ICD-10-CM | POA: Diagnosis not present

## 2022-02-21 DIAGNOSIS — J301 Allergic rhinitis due to pollen: Secondary | ICD-10-CM | POA: Diagnosis not present

## 2022-02-28 DIAGNOSIS — J3089 Other allergic rhinitis: Secondary | ICD-10-CM | POA: Diagnosis not present

## 2022-02-28 DIAGNOSIS — J3081 Allergic rhinitis due to animal (cat) (dog) hair and dander: Secondary | ICD-10-CM | POA: Diagnosis not present

## 2022-02-28 DIAGNOSIS — J301 Allergic rhinitis due to pollen: Secondary | ICD-10-CM | POA: Diagnosis not present

## 2022-03-02 ENCOUNTER — Encounter: Payer: Self-pay | Admitting: Gastroenterology

## 2022-03-03 DIAGNOSIS — E039 Hypothyroidism, unspecified: Secondary | ICD-10-CM | POA: Diagnosis not present

## 2022-03-03 DIAGNOSIS — M1990 Unspecified osteoarthritis, unspecified site: Secondary | ICD-10-CM | POA: Diagnosis not present

## 2022-03-03 DIAGNOSIS — I10 Essential (primary) hypertension: Secondary | ICD-10-CM | POA: Diagnosis not present

## 2022-03-03 DIAGNOSIS — E7800 Pure hypercholesterolemia, unspecified: Secondary | ICD-10-CM | POA: Diagnosis not present

## 2022-03-03 DIAGNOSIS — E1165 Type 2 diabetes mellitus with hyperglycemia: Secondary | ICD-10-CM | POA: Diagnosis not present

## 2022-03-07 DIAGNOSIS — J301 Allergic rhinitis due to pollen: Secondary | ICD-10-CM | POA: Diagnosis not present

## 2022-03-07 DIAGNOSIS — J3081 Allergic rhinitis due to animal (cat) (dog) hair and dander: Secondary | ICD-10-CM | POA: Diagnosis not present

## 2022-03-07 DIAGNOSIS — J3089 Other allergic rhinitis: Secondary | ICD-10-CM | POA: Diagnosis not present

## 2022-03-14 DIAGNOSIS — J3089 Other allergic rhinitis: Secondary | ICD-10-CM | POA: Diagnosis not present

## 2022-03-14 DIAGNOSIS — J301 Allergic rhinitis due to pollen: Secondary | ICD-10-CM | POA: Diagnosis not present

## 2022-03-14 DIAGNOSIS — J3081 Allergic rhinitis due to animal (cat) (dog) hair and dander: Secondary | ICD-10-CM | POA: Diagnosis not present

## 2022-03-21 DIAGNOSIS — J3081 Allergic rhinitis due to animal (cat) (dog) hair and dander: Secondary | ICD-10-CM | POA: Diagnosis not present

## 2022-03-21 DIAGNOSIS — J3089 Other allergic rhinitis: Secondary | ICD-10-CM | POA: Diagnosis not present

## 2022-03-21 DIAGNOSIS — J301 Allergic rhinitis due to pollen: Secondary | ICD-10-CM | POA: Diagnosis not present

## 2022-03-27 DIAGNOSIS — I10 Essential (primary) hypertension: Secondary | ICD-10-CM | POA: Diagnosis not present

## 2022-03-27 DIAGNOSIS — R69 Illness, unspecified: Secondary | ICD-10-CM | POA: Diagnosis not present

## 2022-03-27 DIAGNOSIS — E1169 Type 2 diabetes mellitus with other specified complication: Secondary | ICD-10-CM | POA: Diagnosis not present

## 2022-03-27 DIAGNOSIS — E1165 Type 2 diabetes mellitus with hyperglycemia: Secondary | ICD-10-CM | POA: Diagnosis not present

## 2022-03-27 DIAGNOSIS — G4700 Insomnia, unspecified: Secondary | ICD-10-CM | POA: Diagnosis not present

## 2022-03-27 DIAGNOSIS — J3081 Allergic rhinitis due to animal (cat) (dog) hair and dander: Secondary | ICD-10-CM | POA: Diagnosis not present

## 2022-03-27 DIAGNOSIS — E7800 Pure hypercholesterolemia, unspecified: Secondary | ICD-10-CM | POA: Diagnosis not present

## 2022-03-27 DIAGNOSIS — M1990 Unspecified osteoarthritis, unspecified site: Secondary | ICD-10-CM | POA: Diagnosis not present

## 2022-03-27 DIAGNOSIS — J3089 Other allergic rhinitis: Secondary | ICD-10-CM | POA: Diagnosis not present

## 2022-03-27 DIAGNOSIS — E039 Hypothyroidism, unspecified: Secondary | ICD-10-CM | POA: Diagnosis not present

## 2022-03-27 DIAGNOSIS — J301 Allergic rhinitis due to pollen: Secondary | ICD-10-CM | POA: Diagnosis not present

## 2022-04-04 DIAGNOSIS — J3081 Allergic rhinitis due to animal (cat) (dog) hair and dander: Secondary | ICD-10-CM | POA: Diagnosis not present

## 2022-04-04 DIAGNOSIS — J301 Allergic rhinitis due to pollen: Secondary | ICD-10-CM | POA: Diagnosis not present

## 2022-04-04 DIAGNOSIS — J3089 Other allergic rhinitis: Secondary | ICD-10-CM | POA: Diagnosis not present

## 2022-04-11 DIAGNOSIS — J3081 Allergic rhinitis due to animal (cat) (dog) hair and dander: Secondary | ICD-10-CM | POA: Diagnosis not present

## 2022-04-11 DIAGNOSIS — J301 Allergic rhinitis due to pollen: Secondary | ICD-10-CM | POA: Diagnosis not present

## 2022-04-11 DIAGNOSIS — J3089 Other allergic rhinitis: Secondary | ICD-10-CM | POA: Diagnosis not present

## 2022-04-18 DIAGNOSIS — J3089 Other allergic rhinitis: Secondary | ICD-10-CM | POA: Diagnosis not present

## 2022-04-18 DIAGNOSIS — J3081 Allergic rhinitis due to animal (cat) (dog) hair and dander: Secondary | ICD-10-CM | POA: Diagnosis not present

## 2022-04-18 DIAGNOSIS — J301 Allergic rhinitis due to pollen: Secondary | ICD-10-CM | POA: Diagnosis not present

## 2022-04-25 DIAGNOSIS — J3081 Allergic rhinitis due to animal (cat) (dog) hair and dander: Secondary | ICD-10-CM | POA: Diagnosis not present

## 2022-04-25 DIAGNOSIS — J3089 Other allergic rhinitis: Secondary | ICD-10-CM | POA: Diagnosis not present

## 2022-04-25 DIAGNOSIS — J301 Allergic rhinitis due to pollen: Secondary | ICD-10-CM | POA: Diagnosis not present

## 2022-04-26 DIAGNOSIS — Z780 Asymptomatic menopausal state: Secondary | ICD-10-CM | POA: Diagnosis not present

## 2022-04-26 DIAGNOSIS — B372 Candidiasis of skin and nail: Secondary | ICD-10-CM | POA: Diagnosis not present

## 2022-04-26 DIAGNOSIS — Z9189 Other specified personal risk factors, not elsewhere classified: Secondary | ICD-10-CM | POA: Diagnosis not present

## 2022-04-26 DIAGNOSIS — Z8742 Personal history of other diseases of the female genital tract: Secondary | ICD-10-CM | POA: Diagnosis not present

## 2022-05-01 ENCOUNTER — Other Ambulatory Visit: Payer: Self-pay | Admitting: Nurse Practitioner

## 2022-05-01 DIAGNOSIS — Z1231 Encounter for screening mammogram for malignant neoplasm of breast: Secondary | ICD-10-CM

## 2022-05-02 DIAGNOSIS — J301 Allergic rhinitis due to pollen: Secondary | ICD-10-CM | POA: Diagnosis not present

## 2022-05-02 DIAGNOSIS — J3081 Allergic rhinitis due to animal (cat) (dog) hair and dander: Secondary | ICD-10-CM | POA: Diagnosis not present

## 2022-05-02 DIAGNOSIS — J3089 Other allergic rhinitis: Secondary | ICD-10-CM | POA: Diagnosis not present

## 2022-05-04 ENCOUNTER — Ambulatory Visit
Admission: RE | Admit: 2022-05-04 | Discharge: 2022-05-04 | Disposition: A | Discharge status: Home / Self Care | Payer: Medicare HMO | Source: Ambulatory Visit | Attending: Nurse Practitioner | Admitting: Nurse Practitioner

## 2022-05-04 DIAGNOSIS — Z1231 Encounter for screening mammogram for malignant neoplasm of breast: Secondary | ICD-10-CM

## 2022-05-09 DIAGNOSIS — J3081 Allergic rhinitis due to animal (cat) (dog) hair and dander: Secondary | ICD-10-CM | POA: Diagnosis not present

## 2022-05-09 DIAGNOSIS — J709 Respiratory conditions due to unspecified external agent: Secondary | ICD-10-CM | POA: Diagnosis not present

## 2022-05-09 DIAGNOSIS — R21 Rash and other nonspecific skin eruption: Secondary | ICD-10-CM | POA: Diagnosis not present

## 2022-05-09 DIAGNOSIS — J301 Allergic rhinitis due to pollen: Secondary | ICD-10-CM | POA: Diagnosis not present

## 2022-05-09 DIAGNOSIS — R052 Subacute cough: Secondary | ICD-10-CM | POA: Diagnosis not present

## 2022-05-09 DIAGNOSIS — J3089 Other allergic rhinitis: Secondary | ICD-10-CM | POA: Diagnosis not present

## 2022-05-16 DIAGNOSIS — J3081 Allergic rhinitis due to animal (cat) (dog) hair and dander: Secondary | ICD-10-CM | POA: Diagnosis not present

## 2022-05-16 DIAGNOSIS — J3089 Other allergic rhinitis: Secondary | ICD-10-CM | POA: Diagnosis not present

## 2022-05-16 DIAGNOSIS — J301 Allergic rhinitis due to pollen: Secondary | ICD-10-CM | POA: Diagnosis not present

## 2022-05-18 DIAGNOSIS — E1169 Type 2 diabetes mellitus with other specified complication: Secondary | ICD-10-CM | POA: Diagnosis not present

## 2022-05-18 DIAGNOSIS — N3281 Overactive bladder: Secondary | ICD-10-CM | POA: Diagnosis not present

## 2022-05-18 DIAGNOSIS — R69 Illness, unspecified: Secondary | ICD-10-CM | POA: Diagnosis not present

## 2022-05-18 DIAGNOSIS — R609 Edema, unspecified: Secondary | ICD-10-CM | POA: Diagnosis not present

## 2022-05-18 DIAGNOSIS — E039 Hypothyroidism, unspecified: Secondary | ICD-10-CM | POA: Diagnosis not present

## 2022-05-18 DIAGNOSIS — Z23 Encounter for immunization: Secondary | ICD-10-CM | POA: Diagnosis not present

## 2022-05-18 DIAGNOSIS — E7800 Pure hypercholesterolemia, unspecified: Secondary | ICD-10-CM | POA: Diagnosis not present

## 2022-05-18 DIAGNOSIS — M62830 Muscle spasm of back: Secondary | ICD-10-CM | POA: Diagnosis not present

## 2022-05-18 DIAGNOSIS — M1990 Unspecified osteoarthritis, unspecified site: Secondary | ICD-10-CM | POA: Diagnosis not present

## 2022-05-18 DIAGNOSIS — I10 Essential (primary) hypertension: Secondary | ICD-10-CM | POA: Diagnosis not present

## 2022-05-18 DIAGNOSIS — G4700 Insomnia, unspecified: Secondary | ICD-10-CM | POA: Diagnosis not present

## 2022-05-22 DIAGNOSIS — J3089 Other allergic rhinitis: Secondary | ICD-10-CM | POA: Diagnosis not present

## 2022-05-22 DIAGNOSIS — J3081 Allergic rhinitis due to animal (cat) (dog) hair and dander: Secondary | ICD-10-CM | POA: Diagnosis not present

## 2022-05-22 DIAGNOSIS — J301 Allergic rhinitis due to pollen: Secondary | ICD-10-CM | POA: Diagnosis not present

## 2022-05-25 ENCOUNTER — Encounter: Payer: Self-pay | Admitting: Gastroenterology

## 2022-05-31 DIAGNOSIS — J301 Allergic rhinitis due to pollen: Secondary | ICD-10-CM | POA: Diagnosis not present

## 2022-05-31 DIAGNOSIS — J3081 Allergic rhinitis due to animal (cat) (dog) hair and dander: Secondary | ICD-10-CM | POA: Diagnosis not present

## 2022-05-31 DIAGNOSIS — J3089 Other allergic rhinitis: Secondary | ICD-10-CM | POA: Diagnosis not present

## 2022-06-02 ENCOUNTER — Telehealth: Payer: Self-pay | Admitting: *Deleted

## 2022-06-02 NOTE — Telephone Encounter (Signed)
[name],   This pt is a documented difficult intubation and her procedure will need to be done at the hospital.   Thanks,  Osvaldo Angst

## 2022-06-05 ENCOUNTER — Telehealth: Payer: Self-pay | Admitting: *Deleted

## 2022-06-05 DIAGNOSIS — J3089 Other allergic rhinitis: Secondary | ICD-10-CM | POA: Diagnosis not present

## 2022-06-05 DIAGNOSIS — J301 Allergic rhinitis due to pollen: Secondary | ICD-10-CM | POA: Diagnosis not present

## 2022-06-05 DIAGNOSIS — J3081 Allergic rhinitis due to animal (cat) (dog) hair and dander: Secondary | ICD-10-CM | POA: Diagnosis not present

## 2022-06-05 NOTE — Telephone Encounter (Signed)
Dr Fuller Plan, per Jenny Reichmann this needs to be done at the hospital.  Does she need an office visit first?

## 2022-06-05 NOTE — Telephone Encounter (Signed)
OK for direct schedule colonoscopy at [facility].

## 2022-06-05 NOTE — Telephone Encounter (Signed)
Patient notified that she will need to be done at hospital and currently awaiting reply from MD if she needs office visit and to be assigned a date and time at [facility]

## 2022-06-06 ENCOUNTER — Other Ambulatory Visit: Payer: Self-pay

## 2022-06-06 DIAGNOSIS — Z1211 Encounter for screening for malignant neoplasm of colon: Secondary | ICD-10-CM

## 2022-06-06 NOTE — Telephone Encounter (Signed)
Scheduled colon with patient for 07/20/22 at 1:30 pm with Dr. Fuller Plan at [facility]. Pt advised to keep PV on 06/12/22 & that she would receive instructions then. Pt verbalized all understanding.

## 2022-06-06 NOTE — Telephone Encounter (Signed)
Left message for patient to call back  

## 2022-06-06 NOTE — Telephone Encounter (Signed)
Hi, could you please work on obtaining a date at the hospital for this patient.  Thanks!

## 2022-06-12 ENCOUNTER — Telehealth: Payer: Self-pay | Admitting: *Deleted

## 2022-06-12 NOTE — Telephone Encounter (Signed)
Unable to reach patient for pre visit .  Message left requesting patient call and reschedule PV to avoid cancellation of colonoscopy 07/20/22.

## 2022-06-13 DIAGNOSIS — E1165 Type 2 diabetes mellitus with hyperglycemia: Secondary | ICD-10-CM | POA: Diagnosis not present

## 2022-06-13 DIAGNOSIS — E039 Hypothyroidism, unspecified: Secondary | ICD-10-CM | POA: Diagnosis not present

## 2022-06-13 DIAGNOSIS — I10 Essential (primary) hypertension: Secondary | ICD-10-CM | POA: Diagnosis not present

## 2022-06-13 DIAGNOSIS — E7800 Pure hypercholesterolemia, unspecified: Secondary | ICD-10-CM | POA: Diagnosis not present

## 2022-06-14 DIAGNOSIS — J3089 Other allergic rhinitis: Secondary | ICD-10-CM | POA: Diagnosis not present

## 2022-06-14 DIAGNOSIS — J301 Allergic rhinitis due to pollen: Secondary | ICD-10-CM | POA: Diagnosis not present

## 2022-06-14 DIAGNOSIS — J3081 Allergic rhinitis due to animal (cat) (dog) hair and dander: Secondary | ICD-10-CM | POA: Diagnosis not present

## 2022-06-15 ENCOUNTER — Telehealth: Payer: Self-pay | Admitting: *Deleted

## 2022-06-15 ENCOUNTER — Ambulatory Visit (AMBULATORY_SURGERY_CENTER): Payer: Self-pay | Admitting: *Deleted

## 2022-06-15 VITALS — Ht 61.0 in | Wt 260.0 lb

## 2022-06-15 DIAGNOSIS — Z86010 Personal history of colonic polyps: Secondary | ICD-10-CM

## 2022-06-15 NOTE — Progress Notes (Signed)
No egg or soy allergy known to patient  No issues known to pt with past sedation with any surgeries or procedures Patient denies ever being told they had issues or difficulty with intubation  No FH of Malignant Hyperthermia Pt is not on diet pills Pt is not on  home 02  Pt is not on blood thinners  Pt denies issues with constipation  Pt encouraged to use to use Singlecare or Goodrx to reduce cost  Telephone visit Coupon given

## 2022-06-15 NOTE — Telephone Encounter (Signed)
Attempted call for previsit. Unable to reach pt. LM with pt and gave return contact number for

## 2022-06-15 NOTE — Addendum Note (Signed)
Addended by: Etheleen Nicks on: 06/15/2022 11:10 AM   Modules accepted: Orders

## 2022-06-20 DIAGNOSIS — J3081 Allergic rhinitis due to animal (cat) (dog) hair and dander: Secondary | ICD-10-CM | POA: Diagnosis not present

## 2022-06-20 DIAGNOSIS — J3089 Other allergic rhinitis: Secondary | ICD-10-CM | POA: Diagnosis not present

## 2022-06-20 DIAGNOSIS — J301 Allergic rhinitis due to pollen: Secondary | ICD-10-CM | POA: Diagnosis not present

## 2022-06-21 ENCOUNTER — Encounter: Payer: Self-pay | Admitting: *Deleted

## 2022-06-23 DIAGNOSIS — E039 Hypothyroidism, unspecified: Secondary | ICD-10-CM | POA: Diagnosis not present

## 2022-06-23 DIAGNOSIS — I10 Essential (primary) hypertension: Secondary | ICD-10-CM | POA: Diagnosis not present

## 2022-06-23 DIAGNOSIS — E7800 Pure hypercholesterolemia, unspecified: Secondary | ICD-10-CM | POA: Diagnosis not present

## 2022-06-23 DIAGNOSIS — E1165 Type 2 diabetes mellitus with hyperglycemia: Secondary | ICD-10-CM | POA: Diagnosis not present

## 2022-06-23 DIAGNOSIS — E785 Hyperlipidemia, unspecified: Secondary | ICD-10-CM | POA: Diagnosis not present

## 2022-06-23 NOTE — Telephone Encounter (Signed)
Opened encounter in error  

## 2022-06-27 ENCOUNTER — Encounter: Payer: Medicare HMO | Admitting: Gastroenterology

## 2022-06-28 DIAGNOSIS — J3089 Other allergic rhinitis: Secondary | ICD-10-CM | POA: Diagnosis not present

## 2022-06-28 DIAGNOSIS — J3081 Allergic rhinitis due to animal (cat) (dog) hair and dander: Secondary | ICD-10-CM | POA: Diagnosis not present

## 2022-06-28 DIAGNOSIS — J301 Allergic rhinitis due to pollen: Secondary | ICD-10-CM | POA: Diagnosis not present

## 2022-06-30 ENCOUNTER — Telehealth: Payer: Self-pay | Admitting: Gastroenterology

## 2022-06-30 NOTE — Telephone Encounter (Signed)
Inbound call about prep medication. Has her prescriptions delivered and wanted prep to be sent at that time as well. [facility]. Please advise.

## 2022-07-04 DIAGNOSIS — J3081 Allergic rhinitis due to animal (cat) (dog) hair and dander: Secondary | ICD-10-CM | POA: Diagnosis not present

## 2022-07-04 DIAGNOSIS — J3089 Other allergic rhinitis: Secondary | ICD-10-CM | POA: Diagnosis not present

## 2022-07-04 DIAGNOSIS — J301 Allergic rhinitis due to pollen: Secondary | ICD-10-CM | POA: Diagnosis not present

## 2022-07-05 NOTE — Telephone Encounter (Unsigned)
Attempted to call pt but was unable to reach LM with telephone fro return call back.

## 2022-07-06 ENCOUNTER — Telehealth: Payer: Self-pay

## 2022-07-06 DIAGNOSIS — Z86010 Personal history of colonic polyps: Secondary | ICD-10-CM

## 2022-07-06 MED ORDER — SUTAB 1479-225-188 MG PO TABS: 12.0000 | ORAL_TABLET | ORAL | 0 refills | Status: DC

## 2022-07-06 NOTE — Telephone Encounter (Signed)
Incoming call from patient returning your call

## 2022-07-06 NOTE — Telephone Encounter (Signed)
Pt not sure why [name] was calling her, states her pharmacy does not have the sutab rx there, sent [facility]

## 2022-07-06 NOTE — Telephone Encounter (Signed)
Patient is calling again requesting call back for 3rd time today.

## 2022-07-11 DIAGNOSIS — J301 Allergic rhinitis due to pollen: Secondary | ICD-10-CM | POA: Diagnosis not present

## 2022-07-11 DIAGNOSIS — J3089 Other allergic rhinitis: Secondary | ICD-10-CM | POA: Diagnosis not present

## 2022-07-11 DIAGNOSIS — J3081 Allergic rhinitis due to animal (cat) (dog) hair and dander: Secondary | ICD-10-CM | POA: Diagnosis not present

## 2022-07-12 ENCOUNTER — Encounter (HOSPITAL_COMMUNITY): Payer: Self-pay | Admitting: Gastroenterology

## 2022-07-12 ENCOUNTER — Other Ambulatory Visit: Payer: Self-pay

## 2022-07-18 DIAGNOSIS — J3089 Other allergic rhinitis: Secondary | ICD-10-CM | POA: Diagnosis not present

## 2022-07-18 DIAGNOSIS — J3081 Allergic rhinitis due to animal (cat) (dog) hair and dander: Secondary | ICD-10-CM | POA: Diagnosis not present

## 2022-07-18 DIAGNOSIS — J301 Allergic rhinitis due to pollen: Secondary | ICD-10-CM | POA: Diagnosis not present

## 2022-07-19 ENCOUNTER — Encounter: Payer: Self-pay | Admitting: Gastroenterology

## 2022-07-20 ENCOUNTER — Ambulatory Visit (HOSPITAL_COMMUNITY): Payer: Medicare HMO | Admitting: Certified Registered Nurse Anesthetist

## 2022-07-20 ENCOUNTER — Ambulatory Visit (HOSPITAL_BASED_OUTPATIENT_CLINIC_OR_DEPARTMENT_OTHER): Payer: Medicare HMO | Admitting: Certified Registered Nurse Anesthetist

## 2022-07-20 ENCOUNTER — Ambulatory Visit (HOSPITAL_COMMUNITY)
Admission: RE | Admit: 2022-07-20 | Discharge: 2022-07-20 | Disposition: A | Discharge status: Home / Self Care | Payer: Medicare HMO | Attending: Gastroenterology | Admitting: Gastroenterology

## 2022-07-20 ENCOUNTER — Encounter (HOSPITAL_COMMUNITY)
Admission: RE | Disposition: A | Discharge status: Home / Self Care | Payer: Self-pay | Source: Home / Self Care | Attending: Gastroenterology

## 2022-07-20 ENCOUNTER — Other Ambulatory Visit: Payer: Self-pay

## 2022-07-20 ENCOUNTER — Encounter (HOSPITAL_COMMUNITY): Payer: Self-pay | Admitting: Gastroenterology

## 2022-07-20 DIAGNOSIS — Z794 Long term (current) use of insulin: Secondary | ICD-10-CM | POA: Diagnosis not present

## 2022-07-20 DIAGNOSIS — Z860100 Personal history of colon polyps, unspecified: Secondary | ICD-10-CM

## 2022-07-20 DIAGNOSIS — E119 Type 2 diabetes mellitus without complications: Secondary | ICD-10-CM | POA: Diagnosis not present

## 2022-07-20 DIAGNOSIS — E1136 Type 2 diabetes mellitus with diabetic cataract: Secondary | ICD-10-CM | POA: Insufficient documentation

## 2022-07-20 DIAGNOSIS — I10 Essential (primary) hypertension: Secondary | ICD-10-CM

## 2022-07-20 DIAGNOSIS — Z1211 Encounter for screening for malignant neoplasm of colon: Secondary | ICD-10-CM | POA: Diagnosis not present

## 2022-07-20 DIAGNOSIS — D124 Benign neoplasm of descending colon: Secondary | ICD-10-CM

## 2022-07-20 DIAGNOSIS — E039 Hypothyroidism, unspecified: Secondary | ICD-10-CM | POA: Insufficient documentation

## 2022-07-20 DIAGNOSIS — Z09 Encounter for follow-up examination after completed treatment for conditions other than malignant neoplasm: Secondary | ICD-10-CM

## 2022-07-20 DIAGNOSIS — G4730 Sleep apnea, unspecified: Secondary | ICD-10-CM | POA: Diagnosis not present

## 2022-07-20 DIAGNOSIS — Z7985 Long-term (current) use of injectable non-insulin antidiabetic drugs: Secondary | ICD-10-CM | POA: Insufficient documentation

## 2022-07-20 DIAGNOSIS — Z6841 Body Mass Index (BMI) 40.0 and over, adult: Secondary | ICD-10-CM

## 2022-07-20 HISTORY — DX: Failed or difficult intubation, initial encounter: T88.4XXA

## 2022-07-20 HISTORY — PX: POLYPECTOMY: SHX5525

## 2022-07-20 HISTORY — PX: COLONOSCOPY WITH PROPOFOL: SHX5780

## 2022-07-20 LAB — GLUCOSE, CAPILLARY: Glucose-Capillary: 89 mg/dL (ref 70–99)

## 2022-07-20 SURGERY — COLONOSCOPY WITH PROPOFOL
Anesthesia: Monitor Anesthesia Care

## 2022-07-20 MED ORDER — LACTATED RINGERS IV SOLN: INTRAVENOUS | Status: DC

## 2022-07-20 MED ORDER — SODIUM CHLORIDE 0.9 % IV SOLN: INTRAVENOUS | Status: DC

## 2022-07-20 MED ORDER — PROPOFOL 500 MG/50ML IV EMUL
INTRAVENOUS | Status: AC
  Filled 2022-07-20: qty 50

## 2022-07-20 MED ORDER — PROPOFOL 500 MG/50ML IV EMUL
INTRAVENOUS | Status: DC | PRN
  Administered 2022-07-20: 150 ug/kg/min via INTRAVENOUS

## 2022-07-20 MED ORDER — PROPOFOL 1000 MG/100ML IV EMUL
INTRAVENOUS | Status: AC
  Filled 2022-07-20: qty 100

## 2022-07-20 SURGICAL SUPPLY — 22 items

## 2022-07-20 NOTE — Discharge Instructions (Signed)
YOU HAD AN ENDOSCOPIC PROCEDURE TODAY: Refer to the procedure report and other information in the discharge instructions given to you for any specific questions about what was found during the examination. If this information does not answer your questions, please call [place] office at [phone] to clarify.  ° °YOU SHOULD EXPECT: Some feelings of bloating in the abdomen. Passage of more gas than usual. Walking can help get rid of the air that was put into your GI tract during the procedure and reduce the bloating. If you had a lower endoscopy (such as a colonoscopy or flexible sigmoidoscopy) you may notice spotting of blood in your stool or on the toilet paper. Some abdominal soreness may be present for a day or two, also. ° °DIET: Your first meal following the procedure should be a light meal and then it is ok to progress to your normal diet. A half-sandwich or bowl of soup is an example of a good first meal. Heavy or fried foods are harder to digest and may make you feel nauseous or bloated. Drink plenty of fluids but you should avoid alcoholic beverages for 24 hours. If you had a esophageal dilation, please see attached instructions for diet.   ° °ACTIVITY: Your care partner should take you home directly after the procedure. You should plan to take it easy, moving slowly for the rest of the day. You can resume normal activity the day after the procedure however YOU SHOULD NOT DRIVE, use power tools, machinery or perform tasks that involve climbing or major physical exertion for 24 hours (because of the sedation medicines used during the test).  ° °SYMPTOMS TO REPORT IMMEDIATELY: °A gastroenterologist can be reached at any hour. Please call [phone]  for any of the following symptoms:  °Following lower endoscopy (colonoscopy, flexible sigmoidoscopy) °Excessive amounts of blood in the stool  °Significant tenderness, worsening of abdominal pains  °Swelling of the abdomen that is new, acute  °Fever of 100° or  higher  °Following upper endoscopy (EGD, EUS, ERCP, esophageal dilation) °Vomiting of blood or coffee ground material  °New, significant abdominal pain  °New, significant chest pain or pain under the shoulder blades  °Painful or persistently difficult swallowing  °New shortness of breath  °Black, tarry-looking or red, bloody stools ° °FOLLOW UP:  °If any biopsies were taken you will be contacted by phone or by letter within the next 1-3 weeks. Call [phone]  if you have not heard about the biopsies in 3 weeks.  °Please also call with any specific questions about appointments or follow up tests. ° °

## 2022-07-20 NOTE — Op Note (Signed)
[facility] Patient Name: [name] Procedure Date: 07/20/2022 MRN: [id number] Attending MD: Ladene Artist , MD, [id number] Date of Birth: 1965-09-10 CSN: [id number] Age: 57 Admit Type: Outpatient Procedure:                Colonoscopy Indications:              High risk colon cancer surveillance: Personal                            history of traditional serrated adenoma of the colon Providers:                Pricilla Riffle. Fuller Plan, MD, Jeanella Cara, RN,                            Gloris Ham, Technician Referring MD:             Antony Contras, MD Medicines:                Monitored Anesthesia Care Complications:            No immediate complications. Estimated blood loss:                            None. Estimated Blood Loss:     Estimated blood loss: none. Procedure:                Pre-Anesthesia Assessment:                           - Prior to the procedure, a History and Physical                            was performed, and patient medications and                            allergies were reviewed. The patient's tolerance of                            previous anesthesia was also reviewed. The risks                            and benefits of the procedure and the sedation                            options and risks were discussed with the patient.                            All questions were answered, and informed consent                            was obtained. Prior Anticoagulants: The patient has                            taken no anticoagulant or antiplatelet agents. ASA  Grade Assessment: III - A patient with severe                            systemic disease. After reviewing the risks and                            benefits, the patient was deemed in satisfactory                            condition to undergo the procedure.                           After obtaining informed consent, the colonoscope                             was passed under direct vision. Throughout the                            procedure, the patient's blood pressure, pulse, and                            oxygen saturations were monitored continuously. The                            CF-HQ190L ([id number]) Olympus colonoscope was                            introduced through the anus and advanced to the the                            cecum, identified by appendiceal orifice and                            ileocecal valve. The ileocecal valve, appendiceal                            orifice, and rectum were photographed. The quality                            of the bowel preparation was good. The colonoscopy                            was performed without difficulty. The patient                            tolerated the procedure well. Scope In: 1:33:07 PM Scope Out: 1:47:36 PM Scope Withdrawal Time: 0 hours 12 minutes 38 seconds  Total Procedure Duration: 0 hours 14 minutes 29 seconds  Findings:      The perianal and digital rectal examinations were normal.      A 7 mm polyp was found in the descending colon. The polyp was sessile.       The polyp was removed with a cold snare. Resection and retrieval were       complete.  The exam was otherwise without abnormality on direct and retroflexion       views. Impression:               - One 7 mm polyp in the descending colon, removed                            with a cold snare. Resected and retrieved.                           - The examination was otherwise normal on direct                            and retroflexion views. Moderate Sedation:      Not Applicable - Patient had care per Anesthesia. Recommendation:           - Repeat colonoscopy after studies are complete for                            surveillance based on pathology results.                           - Patient has a contact number available for                            emergencies. The signs and symptoms of  potential                            delayed complications were discussed with the                            patient. Return to normal activities tomorrow.                            Written discharge instructions were provided to the                            patient.                           - Resume previous diet.                           - Continue present medications.                           - Await pathology results. Procedure Code(s):        --- Professional ---                           [phone], Colonoscopy, flexible; with removal of                            tumor(s), polyp(s), or other lesion(s) by snare                            technique Diagnosis  Code(s):        --- Professional ---                           Z86.010, Personal history of colonic polyps                           D12.4, Benign neoplasm of descending colon CPT copyright 2022 American [employer] Association. All rights reserved. The codes documented in this report are preliminary and upon coder review may  be revised to meet current compliance requirements. Ladene Artist, MD 07/20/2022 1:52:01 PM This report has been signed electronically. Number of Addenda: 0

## 2022-07-20 NOTE — Anesthesia Postprocedure Evaluation (Signed)
Anesthesia Post Note  Patient: [name]  Procedure(s) Performed: COLONOSCOPY WITH PROPOFOL POLYPECTOMY     Patient location during evaluation: PACU Anesthesia Type: MAC Level of consciousness: awake and alert Pain management: pain level controlled Vital Signs Assessment: post-procedure vital signs reviewed and stable Respiratory status: spontaneous breathing, nonlabored ventilation, respiratory function stable and patient connected to nasal cannula oxygen Cardiovascular status: stable and blood pressure returned to baseline Postop Assessment: no apparent nausea or vomiting Anesthetic complications: no  No notable events documented.  Last Vitals:  Vitals:   07/20/22 1355 07/20/22 1400  BP: 106/64   Pulse: 77 75  Resp: 18 (!) 23  Temp:  36.6 C  SpO2: 98% 99%    Last Pain:  Vitals:   07/20/22 1400  TempSrc: Temporal  PainSc: 0-No pain                 Tari Lecount S

## 2022-07-20 NOTE — Anesthesia Procedure Notes (Signed)
Procedure Name: MAC Date/Time: 07/20/2022 1:23 PM  Performed by: Maxwell Caul, CRNAPre-anesthesia Checklist: Patient identified, Emergency Drugs available, Suction available and Patient being monitored Oxygen Delivery Method: Simple face mask
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
## 2022-07-20 NOTE — Transfer of Care (Signed)
Immediate Anesthesia Transfer of Care Note  Patient: YUVAL NOLET  Procedure(s) Performed: COLONOSCOPY WITH PROPOFOL POLYPECTOMY  Patient Location: PACU  Anesthesia Type:MAC  Level of Consciousness: sedated  Airway & Oxygen Therapy: Patient Spontanous Breathing and Patient connected to face mask oxygen  Post-op Assessment: Report given to RN and Post -op Vital signs reviewed and stable  Post vital signs: Reviewed and stable  Last Vitals:  Vitals Value Taken Time  BP    Temp    Pulse 76 07/20/22 1353  Resp 19 07/20/22 1353  SpO2 89 % 07/20/22 1353  Vitals shown include unvalidated device data.  Last Pain:  Vitals:   07/20/22 1255  TempSrc: Temporal  PainSc: 0-No pain         Complications: No notable events documented.

## 2022-07-20 NOTE — Anesthesia Preprocedure Evaluation (Signed)
Anesthesia Evaluation  Patient identified by MRN, date of birth, ID band Patient awake    Reviewed: Allergy & Precautions, H&P , NPO status , Patient's Chart, lab work & pertinent test results  Airway Mallampati: III  TM Distance: <3 FB Neck ROM: Full    Dental no notable dental hx.    Pulmonary sleep apnea    Pulmonary exam normal breath sounds clear to auscultation       Cardiovascular hypertension, Normal cardiovascular exam Rhythm:Regular Rate:Normal     Neuro/Psych negative neurological ROS  negative psych ROS   GI/Hepatic negative GI ROS, Neg liver ROS,,,  Endo/Other  diabetes, Type 2Hypothyroidism  Morbid obesity  Renal/GU negative Renal ROS  negative genitourinary   Musculoskeletal negative musculoskeletal ROS (+)    Abdominal  (+) + obese  Peds negative pediatric ROS (+)  Hematology negative hematology ROS (+)   Anesthesia Other Findings   Reproductive/Obstetrics negative OB ROS                             Anesthesia Physical Anesthesia Plan  ASA: 3  Anesthesia Plan: MAC   Post-op Pain Management: Minimal or no pain anticipated   Induction: Intravenous  PONV Risk Score and Plan: 2 and Propofol infusion and Treatment may vary due to age or medical condition  Airway Management Planned: Simple Face Mask  Additional Equipment:   Intra-op Plan:   Post-operative Plan:   Informed Consent: I have reviewed the patients History and Physical, chart, labs and discussed the procedure including the risks, benefits and alternatives for the proposed anesthesia with the patient or authorized representative who has indicated his/her understanding and acceptance.     Dental advisory given  Plan Discussed with: CRNA and Surgeon  Anesthesia Plan Comments:        Anesthesia Quick Evaluation

## 2022-07-21 LAB — SURGICAL PATHOLOGY

## 2022-07-24 ENCOUNTER — Encounter (HOSPITAL_COMMUNITY): Payer: Self-pay | Admitting: Gastroenterology

## 2022-07-24 ENCOUNTER — Encounter: Payer: Self-pay | Admitting: Gastroenterology

## 2022-07-25 DIAGNOSIS — E1165 Type 2 diabetes mellitus with hyperglycemia: Secondary | ICD-10-CM | POA: Diagnosis not present

## 2022-07-25 DIAGNOSIS — E7800 Pure hypercholesterolemia, unspecified: Secondary | ICD-10-CM | POA: Diagnosis not present

## 2022-07-25 DIAGNOSIS — J3081 Allergic rhinitis due to animal (cat) (dog) hair and dander: Secondary | ICD-10-CM | POA: Diagnosis not present

## 2022-07-25 DIAGNOSIS — J3089 Other allergic rhinitis: Secondary | ICD-10-CM | POA: Diagnosis not present

## 2022-07-25 DIAGNOSIS — E039 Hypothyroidism, unspecified: Secondary | ICD-10-CM | POA: Diagnosis not present

## 2022-07-25 DIAGNOSIS — J301 Allergic rhinitis due to pollen: Secondary | ICD-10-CM | POA: Diagnosis not present

## 2022-07-25 DIAGNOSIS — I10 Essential (primary) hypertension: Secondary | ICD-10-CM | POA: Diagnosis not present

## 2022-08-01 DIAGNOSIS — J3081 Allergic rhinitis due to animal (cat) (dog) hair and dander: Secondary | ICD-10-CM | POA: Diagnosis not present

## 2022-08-01 DIAGNOSIS — J3089 Other allergic rhinitis: Secondary | ICD-10-CM | POA: Diagnosis not present

## 2022-08-01 DIAGNOSIS — J301 Allergic rhinitis due to pollen: Secondary | ICD-10-CM | POA: Diagnosis not present

## 2022-08-07 DIAGNOSIS — J3089 Other allergic rhinitis: Secondary | ICD-10-CM | POA: Diagnosis not present

## 2022-08-07 DIAGNOSIS — J301 Allergic rhinitis due to pollen: Secondary | ICD-10-CM | POA: Diagnosis not present

## 2022-08-07 DIAGNOSIS — J3081 Allergic rhinitis due to animal (cat) (dog) hair and dander: Secondary | ICD-10-CM | POA: Diagnosis not present

## 2022-08-14 DIAGNOSIS — J3089 Other allergic rhinitis: Secondary | ICD-10-CM | POA: Diagnosis not present

## 2022-08-14 DIAGNOSIS — J301 Allergic rhinitis due to pollen: Secondary | ICD-10-CM | POA: Diagnosis not present

## 2022-08-14 DIAGNOSIS — J3081 Allergic rhinitis due to animal (cat) (dog) hair and dander: Secondary | ICD-10-CM | POA: Diagnosis not present

## 2022-08-16 DIAGNOSIS — E282 Polycystic ovarian syndrome: Secondary | ICD-10-CM | POA: Diagnosis not present

## 2022-08-16 DIAGNOSIS — K760 Fatty (change of) liver, not elsewhere classified: Secondary | ICD-10-CM | POA: Diagnosis not present

## 2022-08-16 DIAGNOSIS — E559 Vitamin D deficiency, unspecified: Secondary | ICD-10-CM | POA: Diagnosis not present

## 2022-08-16 DIAGNOSIS — L680 Hirsutism: Secondary | ICD-10-CM | POA: Diagnosis not present

## 2022-08-16 DIAGNOSIS — I10 Essential (primary) hypertension: Secondary | ICD-10-CM | POA: Diagnosis not present

## 2022-08-16 DIAGNOSIS — E538 Deficiency of other specified B group vitamins: Secondary | ICD-10-CM | POA: Diagnosis not present

## 2022-08-16 DIAGNOSIS — G4733 Obstructive sleep apnea (adult) (pediatric): Secondary | ICD-10-CM | POA: Diagnosis not present

## 2022-08-16 DIAGNOSIS — E039 Hypothyroidism, unspecified: Secondary | ICD-10-CM | POA: Diagnosis not present

## 2022-08-22 DIAGNOSIS — J301 Allergic rhinitis due to pollen: Secondary | ICD-10-CM | POA: Diagnosis not present

## 2022-08-22 DIAGNOSIS — J3089 Other allergic rhinitis: Secondary | ICD-10-CM | POA: Diagnosis not present

## 2022-08-22 DIAGNOSIS — J3081 Allergic rhinitis due to animal (cat) (dog) hair and dander: Secondary | ICD-10-CM | POA: Diagnosis not present

## 2022-08-24 DIAGNOSIS — E1165 Type 2 diabetes mellitus with hyperglycemia: Secondary | ICD-10-CM | POA: Diagnosis not present

## 2022-08-24 DIAGNOSIS — E7800 Pure hypercholesterolemia, unspecified: Secondary | ICD-10-CM | POA: Diagnosis not present

## 2022-08-24 DIAGNOSIS — E039 Hypothyroidism, unspecified: Secondary | ICD-10-CM | POA: Diagnosis not present

## 2022-08-24 DIAGNOSIS — I10 Essential (primary) hypertension: Secondary | ICD-10-CM | POA: Diagnosis not present

## 2022-08-29 DIAGNOSIS — J3089 Other allergic rhinitis: Secondary | ICD-10-CM | POA: Diagnosis not present

## 2022-08-29 DIAGNOSIS — J3081 Allergic rhinitis due to animal (cat) (dog) hair and dander: Secondary | ICD-10-CM | POA: Diagnosis not present

## 2022-08-29 DIAGNOSIS — J301 Allergic rhinitis due to pollen: Secondary | ICD-10-CM | POA: Diagnosis not present

## 2022-08-31 DIAGNOSIS — J3081 Allergic rhinitis due to animal (cat) (dog) hair and dander: Secondary | ICD-10-CM | POA: Diagnosis not present

## 2022-08-31 DIAGNOSIS — J3089 Other allergic rhinitis: Secondary | ICD-10-CM | POA: Diagnosis not present

## 2022-08-31 DIAGNOSIS — J301 Allergic rhinitis due to pollen: Secondary | ICD-10-CM | POA: Diagnosis not present

## 2022-09-05 DIAGNOSIS — J3089 Other allergic rhinitis: Secondary | ICD-10-CM | POA: Diagnosis not present

## 2022-09-05 DIAGNOSIS — J301 Allergic rhinitis due to pollen: Secondary | ICD-10-CM | POA: Diagnosis not present

## 2022-09-05 DIAGNOSIS — J3081 Allergic rhinitis due to animal (cat) (dog) hair and dander: Secondary | ICD-10-CM | POA: Diagnosis not present

## 2022-09-13 DIAGNOSIS — J3081 Allergic rhinitis due to animal (cat) (dog) hair and dander: Secondary | ICD-10-CM | POA: Diagnosis not present

## 2022-09-13 DIAGNOSIS — J301 Allergic rhinitis due to pollen: Secondary | ICD-10-CM | POA: Diagnosis not present

## 2022-09-13 DIAGNOSIS — J3089 Other allergic rhinitis: Secondary | ICD-10-CM | POA: Diagnosis not present

## 2022-09-20 DIAGNOSIS — J3089 Other allergic rhinitis: Secondary | ICD-10-CM | POA: Diagnosis not present

## 2022-09-20 DIAGNOSIS — J301 Allergic rhinitis due to pollen: Secondary | ICD-10-CM | POA: Diagnosis not present

## 2022-09-20 DIAGNOSIS — J3081 Allergic rhinitis due to animal (cat) (dog) hair and dander: Secondary | ICD-10-CM | POA: Diagnosis not present

## 2022-09-25 DIAGNOSIS — J3081 Allergic rhinitis due to animal (cat) (dog) hair and dander: Secondary | ICD-10-CM | POA: Diagnosis not present

## 2022-09-25 DIAGNOSIS — J3089 Other allergic rhinitis: Secondary | ICD-10-CM | POA: Diagnosis not present

## 2022-09-25 DIAGNOSIS — J301 Allergic rhinitis due to pollen: Secondary | ICD-10-CM | POA: Diagnosis not present

## 2022-09-28 DIAGNOSIS — E7800 Pure hypercholesterolemia, unspecified: Secondary | ICD-10-CM | POA: Diagnosis not present

## 2022-09-28 DIAGNOSIS — E039 Hypothyroidism, unspecified: Secondary | ICD-10-CM | POA: Diagnosis not present

## 2022-09-28 DIAGNOSIS — I10 Essential (primary) hypertension: Secondary | ICD-10-CM | POA: Diagnosis not present

## 2022-09-28 DIAGNOSIS — E1165 Type 2 diabetes mellitus with hyperglycemia: Secondary | ICD-10-CM | POA: Diagnosis not present

## 2022-09-29 ENCOUNTER — Other Ambulatory Visit: Payer: Self-pay | Admitting: Family Medicine

## 2022-09-29 DIAGNOSIS — I10 Essential (primary) hypertension: Secondary | ICD-10-CM | POA: Diagnosis not present

## 2022-09-29 DIAGNOSIS — R69 Illness, unspecified: Secondary | ICD-10-CM | POA: Diagnosis not present

## 2022-09-29 DIAGNOSIS — G454 Transient global amnesia: Secondary | ICD-10-CM | POA: Diagnosis not present

## 2022-09-29 DIAGNOSIS — E119 Type 2 diabetes mellitus without complications: Secondary | ICD-10-CM | POA: Diagnosis not present

## 2022-09-29 DIAGNOSIS — E039 Hypothyroidism, unspecified: Secondary | ICD-10-CM | POA: Diagnosis not present

## 2022-10-03 DIAGNOSIS — J301 Allergic rhinitis due to pollen: Secondary | ICD-10-CM | POA: Diagnosis not present

## 2022-10-03 DIAGNOSIS — J3081 Allergic rhinitis due to animal (cat) (dog) hair and dander: Secondary | ICD-10-CM | POA: Diagnosis not present

## 2022-10-03 DIAGNOSIS — J3089 Other allergic rhinitis: Secondary | ICD-10-CM | POA: Diagnosis not present

## 2022-10-09 ENCOUNTER — Encounter: Payer: Self-pay | Admitting: Family Medicine

## 2022-10-10 ENCOUNTER — Ambulatory Visit
Admission: RE | Admit: 2022-10-10 | Discharge: 2022-10-10 | Disposition: A | Discharge status: Home / Self Care | Payer: Medicare HMO | Source: Ambulatory Visit | Attending: Family Medicine | Admitting: Family Medicine

## 2022-10-10 DIAGNOSIS — G454 Transient global amnesia: Secondary | ICD-10-CM | POA: Diagnosis not present

## 2022-10-11 DIAGNOSIS — J3089 Other allergic rhinitis: Secondary | ICD-10-CM | POA: Diagnosis not present

## 2022-10-11 DIAGNOSIS — J301 Allergic rhinitis due to pollen: Secondary | ICD-10-CM | POA: Diagnosis not present

## 2022-10-11 DIAGNOSIS — J3081 Allergic rhinitis due to animal (cat) (dog) hair and dander: Secondary | ICD-10-CM | POA: Diagnosis not present

## 2022-10-17 DIAGNOSIS — J3081 Allergic rhinitis due to animal (cat) (dog) hair and dander: Secondary | ICD-10-CM | POA: Diagnosis not present

## 2022-10-17 DIAGNOSIS — J301 Allergic rhinitis due to pollen: Secondary | ICD-10-CM | POA: Diagnosis not present

## 2022-10-17 DIAGNOSIS — J3089 Other allergic rhinitis: Secondary | ICD-10-CM | POA: Diagnosis not present

## 2022-10-20 DIAGNOSIS — E039 Hypothyroidism, unspecified: Secondary | ICD-10-CM | POA: Diagnosis not present

## 2022-10-20 DIAGNOSIS — F3340 Major depressive disorder, recurrent, in remission, unspecified: Secondary | ICD-10-CM | POA: Diagnosis not present

## 2022-10-20 DIAGNOSIS — E1169 Type 2 diabetes mellitus with other specified complication: Secondary | ICD-10-CM | POA: Diagnosis not present

## 2022-10-20 DIAGNOSIS — I10 Essential (primary) hypertension: Secondary | ICD-10-CM | POA: Diagnosis not present

## 2022-10-23 DIAGNOSIS — J3089 Other allergic rhinitis: Secondary | ICD-10-CM | POA: Diagnosis not present

## 2022-10-23 DIAGNOSIS — J3081 Allergic rhinitis due to animal (cat) (dog) hair and dander: Secondary | ICD-10-CM | POA: Diagnosis not present

## 2022-10-23 DIAGNOSIS — J301 Allergic rhinitis due to pollen: Secondary | ICD-10-CM | POA: Diagnosis not present

## 2022-10-31 DIAGNOSIS — J301 Allergic rhinitis due to pollen: Secondary | ICD-10-CM | POA: Diagnosis not present

## 2022-10-31 DIAGNOSIS — J3089 Other allergic rhinitis: Secondary | ICD-10-CM | POA: Diagnosis not present

## 2022-10-31 DIAGNOSIS — J3081 Allergic rhinitis due to animal (cat) (dog) hair and dander: Secondary | ICD-10-CM | POA: Diagnosis not present

## 2022-11-08 DIAGNOSIS — J3089 Other allergic rhinitis: Secondary | ICD-10-CM | POA: Diagnosis not present

## 2022-11-08 DIAGNOSIS — J3081 Allergic rhinitis due to animal (cat) (dog) hair and dander: Secondary | ICD-10-CM | POA: Diagnosis not present

## 2022-11-08 DIAGNOSIS — J301 Allergic rhinitis due to pollen: Secondary | ICD-10-CM | POA: Diagnosis not present

## 2022-11-14 DIAGNOSIS — J301 Allergic rhinitis due to pollen: Secondary | ICD-10-CM | POA: Diagnosis not present

## 2022-11-14 DIAGNOSIS — J3089 Other allergic rhinitis: Secondary | ICD-10-CM | POA: Diagnosis not present

## 2022-11-14 DIAGNOSIS — J3081 Allergic rhinitis due to animal (cat) (dog) hair and dander: Secondary | ICD-10-CM | POA: Diagnosis not present

## 2022-11-17 DIAGNOSIS — E1169 Type 2 diabetes mellitus with other specified complication: Secondary | ICD-10-CM | POA: Diagnosis not present

## 2022-11-17 DIAGNOSIS — E039 Hypothyroidism, unspecified: Secondary | ICD-10-CM | POA: Diagnosis not present

## 2022-11-17 DIAGNOSIS — F3340 Major depressive disorder, recurrent, in remission, unspecified: Secondary | ICD-10-CM | POA: Diagnosis not present

## 2022-11-17 DIAGNOSIS — I10 Essential (primary) hypertension: Secondary | ICD-10-CM | POA: Diagnosis not present

## 2022-11-17 DIAGNOSIS — M1990 Unspecified osteoarthritis, unspecified site: Secondary | ICD-10-CM | POA: Diagnosis not present

## 2022-11-17 DIAGNOSIS — G4700 Insomnia, unspecified: Secondary | ICD-10-CM | POA: Diagnosis not present

## 2022-11-20 ENCOUNTER — Encounter (HOSPITAL_COMMUNITY): Payer: Self-pay

## 2022-11-20 DIAGNOSIS — J3089 Other allergic rhinitis: Secondary | ICD-10-CM | POA: Diagnosis not present

## 2022-11-20 DIAGNOSIS — J3081 Allergic rhinitis due to animal (cat) (dog) hair and dander: Secondary | ICD-10-CM | POA: Diagnosis not present

## 2022-11-20 DIAGNOSIS — J301 Allergic rhinitis due to pollen: Secondary | ICD-10-CM | POA: Diagnosis not present

## 2022-11-27 DIAGNOSIS — J3089 Other allergic rhinitis: Secondary | ICD-10-CM | POA: Diagnosis not present

## 2022-11-27 DIAGNOSIS — J3081 Allergic rhinitis due to animal (cat) (dog) hair and dander: Secondary | ICD-10-CM | POA: Diagnosis not present

## 2022-11-27 DIAGNOSIS — J301 Allergic rhinitis due to pollen: Secondary | ICD-10-CM | POA: Diagnosis not present

## 2022-12-04 DIAGNOSIS — J301 Allergic rhinitis due to pollen: Secondary | ICD-10-CM | POA: Diagnosis not present

## 2022-12-04 DIAGNOSIS — J3081 Allergic rhinitis due to animal (cat) (dog) hair and dander: Secondary | ICD-10-CM | POA: Diagnosis not present

## 2022-12-04 DIAGNOSIS — J3089 Other allergic rhinitis: Secondary | ICD-10-CM | POA: Diagnosis not present

## 2022-12-08 DIAGNOSIS — L680 Hirsutism: Secondary | ICD-10-CM | POA: Diagnosis not present

## 2022-12-08 DIAGNOSIS — E1169 Type 2 diabetes mellitus with other specified complication: Secondary | ICD-10-CM | POA: Diagnosis not present

## 2022-12-08 DIAGNOSIS — E039 Hypothyroidism, unspecified: Secondary | ICD-10-CM | POA: Diagnosis not present

## 2022-12-08 DIAGNOSIS — E113393 Type 2 diabetes mellitus with moderate nonproliferative diabetic retinopathy without macular edema, bilateral: Secondary | ICD-10-CM | POA: Diagnosis not present

## 2022-12-08 DIAGNOSIS — I10 Essential (primary) hypertension: Secondary | ICD-10-CM | POA: Diagnosis not present

## 2022-12-08 DIAGNOSIS — Z6841 Body Mass Index (BMI) 40.0 and over, adult: Secondary | ICD-10-CM | POA: Diagnosis not present

## 2022-12-08 DIAGNOSIS — E282 Polycystic ovarian syndrome: Secondary | ICD-10-CM | POA: Diagnosis not present

## 2022-12-12 DIAGNOSIS — J3081 Allergic rhinitis due to animal (cat) (dog) hair and dander: Secondary | ICD-10-CM | POA: Diagnosis not present

## 2022-12-12 DIAGNOSIS — J3089 Other allergic rhinitis: Secondary | ICD-10-CM | POA: Diagnosis not present

## 2022-12-12 DIAGNOSIS — J301 Allergic rhinitis due to pollen: Secondary | ICD-10-CM | POA: Diagnosis not present

## 2022-12-19 DIAGNOSIS — J3081 Allergic rhinitis due to animal (cat) (dog) hair and dander: Secondary | ICD-10-CM | POA: Diagnosis not present

## 2022-12-19 DIAGNOSIS — J3089 Other allergic rhinitis: Secondary | ICD-10-CM | POA: Diagnosis not present

## 2022-12-19 DIAGNOSIS — J301 Allergic rhinitis due to pollen: Secondary | ICD-10-CM | POA: Diagnosis not present

## 2022-12-26 DIAGNOSIS — J3081 Allergic rhinitis due to animal (cat) (dog) hair and dander: Secondary | ICD-10-CM | POA: Diagnosis not present

## 2022-12-26 DIAGNOSIS — J3089 Other allergic rhinitis: Secondary | ICD-10-CM | POA: Diagnosis not present

## 2022-12-26 DIAGNOSIS — J301 Allergic rhinitis due to pollen: Secondary | ICD-10-CM | POA: Diagnosis not present

## 2023-01-01 DIAGNOSIS — J301 Allergic rhinitis due to pollen: Secondary | ICD-10-CM | POA: Diagnosis not present

## 2023-01-01 DIAGNOSIS — J3089 Other allergic rhinitis: Secondary | ICD-10-CM | POA: Diagnosis not present

## 2023-01-01 DIAGNOSIS — J3081 Allergic rhinitis due to animal (cat) (dog) hair and dander: Secondary | ICD-10-CM | POA: Diagnosis not present

## 2023-01-04 DIAGNOSIS — F3340 Major depressive disorder, recurrent, in remission, unspecified: Secondary | ICD-10-CM | POA: Diagnosis not present

## 2023-01-04 DIAGNOSIS — M1990 Unspecified osteoarthritis, unspecified site: Secondary | ICD-10-CM | POA: Diagnosis not present

## 2023-01-04 DIAGNOSIS — I700 Atherosclerosis of aorta: Secondary | ICD-10-CM | POA: Diagnosis not present

## 2023-01-04 DIAGNOSIS — E119 Type 2 diabetes mellitus without complications: Secondary | ICD-10-CM | POA: Diagnosis not present

## 2023-01-04 DIAGNOSIS — E7800 Pure hypercholesterolemia, unspecified: Secondary | ICD-10-CM | POA: Diagnosis not present

## 2023-01-04 DIAGNOSIS — M62830 Muscle spasm of back: Secondary | ICD-10-CM | POA: Diagnosis not present

## 2023-01-04 DIAGNOSIS — R609 Edema, unspecified: Secondary | ICD-10-CM | POA: Diagnosis not present

## 2023-01-04 DIAGNOSIS — Z0000 Encounter for general adult medical examination without abnormal findings: Secondary | ICD-10-CM | POA: Diagnosis not present

## 2023-01-04 DIAGNOSIS — Z1159 Encounter for screening for other viral diseases: Secondary | ICD-10-CM | POA: Diagnosis not present

## 2023-01-04 DIAGNOSIS — I10 Essential (primary) hypertension: Secondary | ICD-10-CM | POA: Diagnosis not present

## 2023-01-04 DIAGNOSIS — E039 Hypothyroidism, unspecified: Secondary | ICD-10-CM | POA: Diagnosis not present

## 2023-01-04 DIAGNOSIS — G4700 Insomnia, unspecified: Secondary | ICD-10-CM | POA: Diagnosis not present

## 2023-01-08 DIAGNOSIS — J3089 Other allergic rhinitis: Secondary | ICD-10-CM | POA: Diagnosis not present

## 2023-01-08 DIAGNOSIS — J301 Allergic rhinitis due to pollen: Secondary | ICD-10-CM | POA: Diagnosis not present

## 2023-01-08 DIAGNOSIS — J3081 Allergic rhinitis due to animal (cat) (dog) hair and dander: Secondary | ICD-10-CM | POA: Diagnosis not present

## 2023-01-09 ENCOUNTER — Other Ambulatory Visit: Payer: Self-pay | Admitting: Family Medicine

## 2023-01-09 DIAGNOSIS — E2839 Other primary ovarian failure: Secondary | ICD-10-CM

## 2023-01-11 DIAGNOSIS — R350 Frequency of micturition: Secondary | ICD-10-CM | POA: Diagnosis not present

## 2023-01-11 DIAGNOSIS — N3941 Urge incontinence: Secondary | ICD-10-CM | POA: Diagnosis not present

## 2023-01-16 DIAGNOSIS — J3081 Allergic rhinitis due to animal (cat) (dog) hair and dander: Secondary | ICD-10-CM | POA: Diagnosis not present

## 2023-01-16 DIAGNOSIS — J3089 Other allergic rhinitis: Secondary | ICD-10-CM | POA: Diagnosis not present

## 2023-01-16 DIAGNOSIS — J301 Allergic rhinitis due to pollen: Secondary | ICD-10-CM | POA: Diagnosis not present

## 2023-01-22 DIAGNOSIS — J3089 Other allergic rhinitis: Secondary | ICD-10-CM | POA: Diagnosis not present

## 2023-01-22 DIAGNOSIS — J301 Allergic rhinitis due to pollen: Secondary | ICD-10-CM | POA: Diagnosis not present

## 2023-01-22 DIAGNOSIS — J3081 Allergic rhinitis due to animal (cat) (dog) hair and dander: Secondary | ICD-10-CM | POA: Diagnosis not present

## 2023-01-30 DIAGNOSIS — J3089 Other allergic rhinitis: Secondary | ICD-10-CM | POA: Diagnosis not present

## 2023-01-30 DIAGNOSIS — J301 Allergic rhinitis due to pollen: Secondary | ICD-10-CM | POA: Diagnosis not present

## 2023-01-30 DIAGNOSIS — J3081 Allergic rhinitis due to animal (cat) (dog) hair and dander: Secondary | ICD-10-CM | POA: Diagnosis not present

## 2023-02-06 DIAGNOSIS — J3089 Other allergic rhinitis: Secondary | ICD-10-CM | POA: Diagnosis not present

## 2023-02-06 DIAGNOSIS — J3081 Allergic rhinitis due to animal (cat) (dog) hair and dander: Secondary | ICD-10-CM | POA: Diagnosis not present

## 2023-02-06 DIAGNOSIS — J301 Allergic rhinitis due to pollen: Secondary | ICD-10-CM | POA: Diagnosis not present

## 2023-02-09 DIAGNOSIS — J301 Allergic rhinitis due to pollen: Secondary | ICD-10-CM | POA: Diagnosis not present

## 2023-02-09 DIAGNOSIS — J3081 Allergic rhinitis due to animal (cat) (dog) hair and dander: Secondary | ICD-10-CM | POA: Diagnosis not present

## 2023-02-09 DIAGNOSIS — J3089 Other allergic rhinitis: Secondary | ICD-10-CM | POA: Diagnosis not present

## 2023-02-13 DIAGNOSIS — J3081 Allergic rhinitis due to animal (cat) (dog) hair and dander: Secondary | ICD-10-CM | POA: Diagnosis not present

## 2023-02-13 DIAGNOSIS — J301 Allergic rhinitis due to pollen: Secondary | ICD-10-CM | POA: Diagnosis not present

## 2023-02-13 DIAGNOSIS — J3089 Other allergic rhinitis: Secondary | ICD-10-CM | POA: Diagnosis not present

## 2023-02-14 ENCOUNTER — Other Ambulatory Visit: Payer: Self-pay | Admitting: Family Medicine

## 2023-02-14 DIAGNOSIS — Z1231 Encounter for screening mammogram for malignant neoplasm of breast: Secondary | ICD-10-CM

## 2023-02-20 DIAGNOSIS — J301 Allergic rhinitis due to pollen: Secondary | ICD-10-CM | POA: Diagnosis not present

## 2023-02-20 DIAGNOSIS — J3081 Allergic rhinitis due to animal (cat) (dog) hair and dander: Secondary | ICD-10-CM | POA: Diagnosis not present

## 2023-02-20 DIAGNOSIS — J3089 Other allergic rhinitis: Secondary | ICD-10-CM | POA: Diagnosis not present

## 2023-02-27 DIAGNOSIS — J301 Allergic rhinitis due to pollen: Secondary | ICD-10-CM | POA: Diagnosis not present

## 2023-02-27 DIAGNOSIS — J3089 Other allergic rhinitis: Secondary | ICD-10-CM | POA: Diagnosis not present

## 2023-02-27 DIAGNOSIS — J3081 Allergic rhinitis due to animal (cat) (dog) hair and dander: Secondary | ICD-10-CM | POA: Diagnosis not present

## 2023-03-06 DIAGNOSIS — J3081 Allergic rhinitis due to animal (cat) (dog) hair and dander: Secondary | ICD-10-CM | POA: Diagnosis not present

## 2023-03-06 DIAGNOSIS — J301 Allergic rhinitis due to pollen: Secondary | ICD-10-CM | POA: Diagnosis not present

## 2023-03-06 DIAGNOSIS — J3089 Other allergic rhinitis: Secondary | ICD-10-CM | POA: Diagnosis not present

## 2023-03-13 ENCOUNTER — Emergency Department (HOSPITAL_COMMUNITY): Payer: Medicare HMO

## 2023-03-13 ENCOUNTER — Emergency Department (HOSPITAL_COMMUNITY)
Admission: EM | Admit: 2023-03-13 | Discharge: 2023-03-13 | Disposition: A | Discharge status: Home / Self Care | Payer: Medicare HMO | Attending: Emergency Medicine | Admitting: Emergency Medicine

## 2023-03-13 ENCOUNTER — Encounter (HOSPITAL_COMMUNITY): Payer: Self-pay

## 2023-03-13 ENCOUNTER — Other Ambulatory Visit: Payer: Self-pay

## 2023-03-13 DIAGNOSIS — M545 Low back pain, unspecified: Secondary | ICD-10-CM | POA: Insufficient documentation

## 2023-03-13 DIAGNOSIS — M47816 Spondylosis without myelopathy or radiculopathy, lumbar region: Secondary | ICD-10-CM | POA: Diagnosis not present

## 2023-03-13 DIAGNOSIS — M5136 Other intervertebral disc degeneration, lumbar region: Secondary | ICD-10-CM | POA: Diagnosis not present

## 2023-03-13 DIAGNOSIS — J301 Allergic rhinitis due to pollen: Secondary | ICD-10-CM | POA: Diagnosis not present

## 2023-03-13 DIAGNOSIS — S3992XA Unspecified injury of lower back, initial encounter: Secondary | ICD-10-CM | POA: Diagnosis not present

## 2023-03-13 DIAGNOSIS — M5459 Other low back pain: Secondary | ICD-10-CM | POA: Diagnosis not present

## 2023-03-13 DIAGNOSIS — J3081 Allergic rhinitis due to animal (cat) (dog) hair and dander: Secondary | ICD-10-CM | POA: Diagnosis not present

## 2023-03-13 DIAGNOSIS — J3089 Other allergic rhinitis: Secondary | ICD-10-CM | POA: Diagnosis not present

## 2023-03-13 MED ORDER — KETOROLAC TROMETHAMINE 15 MG/ML IJ SOLN
15.0000 mg | Freq: Once | INTRAMUSCULAR | Status: AC
  Administered 2023-03-13: 15 mg via INTRAMUSCULAR
  Filled 2023-03-13: qty 1

## 2023-03-13 MED ORDER — LIDOCAINE 5 % EX PTCH: 1.0000 | MEDICATED_PATCH | CUTANEOUS | 0 refills | Status: DC

## 2023-03-13 MED ORDER — OXYCODONE HCL 5 MG PO TABS: 5.0000 mg | ORAL_TABLET | ORAL | 0 refills | Status: DC | PRN

## 2023-03-13 MED ORDER — ETODOLAC 400 MG PO TABS: 400.0000 mg | ORAL_TABLET | Freq: Two times a day (BID) | ORAL | 0 refills | Status: DC

## 2023-03-13 MED ORDER — LIDOCAINE 5 % EX PTCH
1.0000 | MEDICATED_PATCH | CUTANEOUS | Status: DC
  Administered 2023-03-13: 1 via TRANSDERMAL
  Filled 2023-03-13: qty 1

## 2023-03-13 MED ORDER — ONDANSETRON HCL 4 MG/2ML IJ SOLN
4.0000 mg | Freq: Once | INTRAMUSCULAR | Status: AC
  Administered 2023-03-13: 4 mg via INTRAVENOUS
  Filled 2023-03-13: qty 2

## 2023-03-13 MED ORDER — OXYCODONE-ACETAMINOPHEN 5-325 MG PO TABS
1.0000 | ORAL_TABLET | Freq: Once | ORAL | Status: AC
  Administered 2023-03-13: 1 via ORAL
  Filled 2023-03-13: qty 1

## 2023-03-13 MED ORDER — METHOCARBAMOL 500 MG PO TABS: 500.0000 mg | ORAL_TABLET | Freq: Two times a day (BID) | ORAL | 0 refills | Status: DC

## 2023-03-13 NOTE — ED Triage Notes (Signed)
Pt with hx of multiple back surgeries and ruptured dics here for eval of lower back pain that started today when she was putting air in the tire and heard a pop. No new incontinence, though has baseline urinary incontinence.

## 2023-03-13 NOTE — ED Provider Notes (Signed)
[place] EMERGENCY DEPARTMENT AT [facility] Provider Note   CSN: [id number] Arrival date & time: 03/13/23  1431     History  Chief Complaint  Patient presents with   Back Pain    [name] is a 57 y.o. female.  57 year old presents today for evaluation of low back pain.  She states she was bent over inflating her husband's truck tires when suddenly she heard a pop.  This occurred around 9:30 AM.  Denies any radicular symptoms, or bowel or bladder incontinence.  States she has history of disc herniation.  reports lumbar surgery in the remote past.  The history is provided by the patient. No language interpreter was used.       Home Medications Prior to Admission medications   Medication Sig Start Date End Date Taking? Authorizing Provider  acetaminophen (TYLENOL) 650 MG CR tablet Take 650 mg by mouth 2 (two) times daily.    [provider]  albuterol (VENTOLIN HFA) 108 (90 Base) MCG/ACT inhaler Inhale 2 puffs into the lungs every 4 (four) hours as needed (Panic attacks). 05/31/22   [provider]  Ascorbic Acid (VITAMIN C) 1000 MG tablet Take 1,000 mg by mouth at bedtime.     [provider]  atorvastatin (LIPITOR) 10 MG tablet Take 10 mg by mouth at bedtime.     [provider]  clotrimazole (LOTRIMIN) 1 % cream Apply 1 Application topically daily as needed (Heat rash). 05/01/22   [provider]  COMFORT EZ PEN NEEDLES 32G X 6 MM MISC Inject into the muscle daily. 01/02/22   [provider]  COVID-19 mRNA bivalent vaccine, Pfizer, (PFIZER COVID-19 VAC BIVALENT) injection Inject into the muscle. 05/17/21   Judyann Munson, MD  cyanocobalamin (VITAMIN B12) 1000 MCG tablet Take 2,000 mcg by mouth every morning.    [provider]  cyclobenzaprine (FLEXERIL) 10 MG tablet Take 10 mg by mouth every 8 (eight) hours as needed for muscle spasms.    [provider]  EPINEPHrine 0.3 mg/0.3 mL IJ SOAJ  injection Inject 0.3 mg into the muscle as needed for anaphylaxis.    [provider]  Eszopiclone 3 MG TABS Take 3 mg by mouth at bedtime. 06/27/22   [provider]  fluticasone (FLONASE) 50 MCG/ACT nasal spray Place 2 sprays into both nostrils every morning.    [provider]  furosemide (LASIX) 20 MG tablet Take 60 mg by mouth every morning.    [provider]  influenza vac split quadrivalent PF (FLUARIX) 0.5 ML injection Inject into the muscle. 05/17/21   Judyann Munson, MD  Insulin Glargine [facility]) 100 UNIT/ML Inject 3 Units into the skin daily as needed (blood glucose is over 150). Sliding scale    [provider]  Lancets (ONETOUCH DELICA PLUS LANCET33G) MISC Apply topically 2 (two) times daily. 03/06/22   [provider]  levothyroxine (SYNTHROID) 125 MCG tablet Take 125 mcg by mouth every morning.    [provider]  lisinopril (PRINIVIL,ZESTRIL) 20 MG tablet Take 20 mg by mouth every morning.    [provider]  montelukast (SINGULAIR) 10 MG tablet Take 10 mg by mouth daily. 07/06/21   [provider]  omeprazole (PRILOSEC) 40 MG capsule Take 40 mg by mouth at bedtime. 05/06/19   [provider]  [facility] ULTRA test strip 3 (three) times daily. as directed 05/25/22   [provider]  oxybutynin (DITROPAN-XL) 10 MG 24 hr tablet Take 10 mg  by mouth 2 (two) times daily.     [provider]  Semaglutide, 1 MG/DOSE, (OZEMPIC, 1 MG/DOSE,) 2 MG/1.5ML SOPN Inject 1 mg into the skin once a week.    [provider]  SUTAB [phone] MG TABS Take 12 tablets by mouth as directed. 07/06/22   Meryl Dare, MD  triamcinolone cream (KENALOG) 0.1 % Apply 1 Application topically daily as needed (rash on lip).    [provider]      Allergies    Hydrocodone-acetaminophen, Prednisolone, Prednisone, Valium [diazepam], Morphine, and Vicodin  [hydrocodone-acetaminophen]    Review of Systems   Review of Systems  Constitutional:  Negative for chills and fever.  Respiratory:  Negative for shortness of breath.   Cardiovascular:  Negative for chest pain.  Gastrointestinal:  Negative for abdominal pain and nausea.  Genitourinary:  Negative for difficulty urinating and dysuria.  Musculoskeletal:  Positive for back pain.  All other systems reviewed and are negative.   Physical Exam Updated Vital Signs BP 117/73 (BP Location: Right Arm)   Pulse 89   Temp 98.1 F (36.7 C) (Oral)   Resp 17   Ht 5\' 1"  (1.549 m)   Wt 119.7 kg   LMP 05/18/2021 (Approximate)   SpO2 97%   BMI 49.86 kg/m  Physical Exam Vitals and nursing note reviewed.  Constitutional:      General: She is not in acute distress.    Appearance: Normal appearance. She is not ill-appearing.  HENT:     Head: Normocephalic and atraumatic.     Nose: Nose normal.  Eyes:     General: No scleral icterus.    Extraocular Movements: Extraocular movements intact.     Conjunctiva/sclera: Conjunctivae normal.  Cardiovascular:     Rate and Rhythm: Normal rate and regular rhythm.  Pulmonary:     Effort: Pulmonary effort is normal. No respiratory distress.  Abdominal:     General: There is no distension.     Tenderness: There is no abdominal tenderness.  Musculoskeletal:        General: Normal range of motion.     Cervical back: Normal range of motion.     Comments: Lumbar midline tenderness to palpation present.  Otherwise cervical, thoracic spine without tenderness to palpation.  Full range of motion bilateral lower extremities as well as upper extremities.  Skin:    General: Skin is warm and dry.  Neurological:     General: No focal deficit present.     Mental Status: She is alert. Mental status is at baseline.     ED Results / Procedures / Treatments   Labs (all labs ordered are listed, but only abnormal results are displayed) Labs Reviewed - No data to  display  EKG None  Radiology CT Lumbar Spine Wo Contrast  Result Date: 03/13/2023 CLINICAL DATA:  Low back pain after trauma. EXAM: CT LUMBAR SPINE WITHOUT CONTRAST TECHNIQUE: Multidetector CT imaging of the lumbar spine was performed without intravenous contrast administration. Multiplanar CT image reconstructions were also generated. RADIATION DOSE REDUCTION: This exam was performed according to the departmental dose-optimization program which includes automated exposure control, adjustment of the mA and/or kV according to patient size and/or use of iterative reconstruction technique. COMPARISON:  Lumbar spine localization radiographs 01/26/2016. MRI lumbar spine 01/01/2016 FINDINGS: Segmentation: 5 lumbar type vertebral bodies. Alignment: Normal alignment. Vertebrae: No vertebral compression deformities. No focal bone lesion or bone destruction. Bone cortex appears intact. Paraspinal and other soft tissues: Negative. Disc levels: Degenerative changes throughout  with disc space narrowing and endplate osteophyte formation. Degenerative changes most prominent at L4-5. Degenerative disc disease changes demonstrated L3-4, L4-5, and L5-S1 levels. Moderate central canal stenosis is demonstrated at L3-4 and L4-5 levels caused by combination of bulging disc annulus and facet joint hypertrophy. Diffuse bulging disc annulus at L5-S1 with prominent left paracentral component causes effacement of the left lateral recess. Degenerative changes in the facet joints. IMPRESSION: Normal alignment of the lumbar spine. Multilevel degenerative changes. No acute displaced fractures are identified. Electronically Signed   By: Burman Nieves M.D.   On: 03/13/2023 20:53    Procedures Procedures    Medications Ordered in ED Medications  lidocaine (LIDODERM) 5 % 1 patch (1 patch Transdermal Patch Applied 03/13/23 2046)  oxyCODONE-acetaminophen (PERCOCET/ROXICET) 5-325 MG per tablet 1 tablet (1 tablet Oral Given 03/13/23  2047)  ondansetron (ZOFRAN) injection 4 mg (4 mg Intravenous Given 03/13/23 2047)  ketorolac (TORADOL) 15 MG/ML injection 15 mg (15 mg Intramuscular Given 03/13/23 2047)    ED Course/ Medical Decision Making/ A&P                                 Medical Decision Making Amount and/or Complexity of Data Reviewed Radiology: ordered.  Risk Prescription drug management.   57 year old female with past medical history as noted above presents today for concern of low back pain.  No trauma or red flag signs or symptoms concerning for cauda equina syndrome or spinal epidural abscess.  CT imaging obtained.  No acute findings.  Multimodal pain regimen given with mild improvement in pain.  Discussion had regarding further evaluation with an MRI however patient defers and would like to be discharged to follow-up with neurosurgery outpatient.  Patient discharged in stable condition.  Short course of pain medication, muscle relaxer, as well as Lodine sent into patient's choice of pharmacy.  Strict return precautions given.   Final Clinical Impression(s) / ED Diagnoses Final diagnoses:  Acute midline low back pain without sciatica    Rx / DC Orders ED Discharge Orders          Ordered    etodolac (LODINE) 400 MG tablet  2 times daily        03/13/23 2153    oxyCODONE (ROXICODONE) 5 MG immediate release tablet  Every 4 hours PRN        03/13/23 2153    lidocaine (LIDODERM) 5 %  Every 24 hours        03/13/23 2153              Marita [place], PA-C 03/13/23 2153    Laurence Spates, MD 03/14/23 0020

## 2023-03-13 NOTE — ED Notes (Signed)
Pt states her legs have started tingling in the last 30 minutes

## 2023-03-13 NOTE — Discharge Instructions (Addendum)
Your CT scan did not show any concerning findings.  I have sent in a few medications into the pharmacy for you.  Discussed MRI however he wants to defer this for now and follow-up with neurosurgery.  If you have any worsening symptoms such as inability to control your bowel or bladder, worsening in pain, or fever please return to the emergency room for evaluation otherwise take 1000 mg of Tylenol every 6-8 hours, the anti-inflammatory medication called Lodine that I had sent into the pharmacy for you.  Robaxin which is a muscle relaxer, and Roxicodone which is a narcotic pain medication will make you drowsy.  Do not drive or do anything else dangerous after taking either of these medications.

## 2023-03-20 DIAGNOSIS — J301 Allergic rhinitis due to pollen: Secondary | ICD-10-CM | POA: Diagnosis not present

## 2023-03-20 DIAGNOSIS — J3081 Allergic rhinitis due to animal (cat) (dog) hair and dander: Secondary | ICD-10-CM | POA: Diagnosis not present

## 2023-03-20 DIAGNOSIS — J3089 Other allergic rhinitis: Secondary | ICD-10-CM | POA: Diagnosis not present

## 2023-03-26 DIAGNOSIS — J3089 Other allergic rhinitis: Secondary | ICD-10-CM | POA: Diagnosis not present

## 2023-03-26 DIAGNOSIS — J301 Allergic rhinitis due to pollen: Secondary | ICD-10-CM | POA: Diagnosis not present

## 2023-03-26 DIAGNOSIS — J3081 Allergic rhinitis due to animal (cat) (dog) hair and dander: Secondary | ICD-10-CM | POA: Diagnosis not present

## 2023-04-03 DIAGNOSIS — J3081 Allergic rhinitis due to animal (cat) (dog) hair and dander: Secondary | ICD-10-CM | POA: Diagnosis not present

## 2023-04-03 DIAGNOSIS — J3089 Other allergic rhinitis: Secondary | ICD-10-CM | POA: Diagnosis not present

## 2023-04-03 DIAGNOSIS — J301 Allergic rhinitis due to pollen: Secondary | ICD-10-CM | POA: Diagnosis not present

## 2023-04-09 DIAGNOSIS — E1169 Type 2 diabetes mellitus with other specified complication: Secondary | ICD-10-CM | POA: Diagnosis not present

## 2023-04-09 DIAGNOSIS — J3081 Allergic rhinitis due to animal (cat) (dog) hair and dander: Secondary | ICD-10-CM | POA: Diagnosis not present

## 2023-04-09 DIAGNOSIS — E559 Vitamin D deficiency, unspecified: Secondary | ICD-10-CM | POA: Diagnosis not present

## 2023-04-09 DIAGNOSIS — J301 Allergic rhinitis due to pollen: Secondary | ICD-10-CM | POA: Diagnosis not present

## 2023-04-09 DIAGNOSIS — J3089 Other allergic rhinitis: Secondary | ICD-10-CM | POA: Diagnosis not present

## 2023-04-09 DIAGNOSIS — E039 Hypothyroidism, unspecified: Secondary | ICD-10-CM | POA: Diagnosis not present

## 2023-04-16 DIAGNOSIS — J301 Allergic rhinitis due to pollen: Secondary | ICD-10-CM | POA: Diagnosis not present

## 2023-04-16 DIAGNOSIS — J3089 Other allergic rhinitis: Secondary | ICD-10-CM | POA: Diagnosis not present

## 2023-04-16 DIAGNOSIS — J3081 Allergic rhinitis due to animal (cat) (dog) hair and dander: Secondary | ICD-10-CM | POA: Diagnosis not present

## 2023-04-23 DIAGNOSIS — J301 Allergic rhinitis due to pollen: Secondary | ICD-10-CM | POA: Diagnosis not present

## 2023-04-23 DIAGNOSIS — J3081 Allergic rhinitis due to animal (cat) (dog) hair and dander: Secondary | ICD-10-CM | POA: Diagnosis not present

## 2023-04-23 DIAGNOSIS — J3089 Other allergic rhinitis: Secondary | ICD-10-CM | POA: Diagnosis not present

## 2023-05-01 DIAGNOSIS — J3089 Other allergic rhinitis: Secondary | ICD-10-CM | POA: Diagnosis not present

## 2023-05-01 DIAGNOSIS — J3081 Allergic rhinitis due to animal (cat) (dog) hair and dander: Secondary | ICD-10-CM | POA: Diagnosis not present

## 2023-05-01 DIAGNOSIS — J301 Allergic rhinitis due to pollen: Secondary | ICD-10-CM | POA: Diagnosis not present

## 2023-05-02 DIAGNOSIS — B372 Candidiasis of skin and nail: Secondary | ICD-10-CM | POA: Diagnosis not present

## 2023-05-02 DIAGNOSIS — Z8742 Personal history of other diseases of the female genital tract: Secondary | ICD-10-CM | POA: Diagnosis not present

## 2023-05-02 DIAGNOSIS — Z9189 Other specified personal risk factors, not elsewhere classified: Secondary | ICD-10-CM | POA: Diagnosis not present

## 2023-05-07 ENCOUNTER — Ambulatory Visit
Admission: RE | Admit: 2023-05-07 | Discharge: 2023-05-07 | Disposition: A | Discharge status: Home / Self Care | Payer: Medicare HMO | Source: Ambulatory Visit | Attending: Family Medicine | Admitting: Family Medicine

## 2023-05-07 DIAGNOSIS — Z1231 Encounter for screening mammogram for malignant neoplasm of breast: Secondary | ICD-10-CM | POA: Diagnosis not present

## 2023-05-08 DIAGNOSIS — J301 Allergic rhinitis due to pollen: Secondary | ICD-10-CM | POA: Diagnosis not present

## 2023-05-08 DIAGNOSIS — J3081 Allergic rhinitis due to animal (cat) (dog) hair and dander: Secondary | ICD-10-CM | POA: Diagnosis not present

## 2023-05-08 DIAGNOSIS — J3089 Other allergic rhinitis: Secondary | ICD-10-CM | POA: Diagnosis not present

## 2023-05-22 DIAGNOSIS — J3089 Other allergic rhinitis: Secondary | ICD-10-CM | POA: Diagnosis not present

## 2023-05-22 DIAGNOSIS — J3081 Allergic rhinitis due to animal (cat) (dog) hair and dander: Secondary | ICD-10-CM | POA: Diagnosis not present

## 2023-05-22 DIAGNOSIS — J301 Allergic rhinitis due to pollen: Secondary | ICD-10-CM | POA: Diagnosis not present

## 2023-06-05 DIAGNOSIS — J3089 Other allergic rhinitis: Secondary | ICD-10-CM | POA: Diagnosis not present

## 2023-06-05 DIAGNOSIS — J3081 Allergic rhinitis due to animal (cat) (dog) hair and dander: Secondary | ICD-10-CM | POA: Diagnosis not present

## 2023-06-05 DIAGNOSIS — J301 Allergic rhinitis due to pollen: Secondary | ICD-10-CM | POA: Diagnosis not present

## 2023-06-18 DIAGNOSIS — J301 Allergic rhinitis due to pollen: Secondary | ICD-10-CM | POA: Diagnosis not present

## 2023-06-18 DIAGNOSIS — J3081 Allergic rhinitis due to animal (cat) (dog) hair and dander: Secondary | ICD-10-CM | POA: Diagnosis not present

## 2023-06-18 DIAGNOSIS — J3089 Other allergic rhinitis: Secondary | ICD-10-CM | POA: Diagnosis not present

## 2023-06-28 DIAGNOSIS — G4700 Insomnia, unspecified: Secondary | ICD-10-CM | POA: Diagnosis not present

## 2023-06-28 DIAGNOSIS — Z6841 Body Mass Index (BMI) 40.0 and over, adult: Secondary | ICD-10-CM | POA: Diagnosis not present

## 2023-06-28 DIAGNOSIS — I10 Essential (primary) hypertension: Secondary | ICD-10-CM | POA: Diagnosis not present

## 2023-06-28 DIAGNOSIS — E039 Hypothyroidism, unspecified: Secondary | ICD-10-CM | POA: Diagnosis not present

## 2023-06-28 DIAGNOSIS — F3340 Major depressive disorder, recurrent, in remission, unspecified: Secondary | ICD-10-CM | POA: Diagnosis not present

## 2023-06-28 DIAGNOSIS — M62830 Muscle spasm of back: Secondary | ICD-10-CM | POA: Diagnosis not present

## 2023-06-28 DIAGNOSIS — R609 Edema, unspecified: Secondary | ICD-10-CM | POA: Diagnosis not present

## 2023-06-28 DIAGNOSIS — E7800 Pure hypercholesterolemia, unspecified: Secondary | ICD-10-CM | POA: Diagnosis not present

## 2023-06-28 DIAGNOSIS — M1990 Unspecified osteoarthritis, unspecified site: Secondary | ICD-10-CM | POA: Diagnosis not present

## 2023-06-28 DIAGNOSIS — N3281 Overactive bladder: Secondary | ICD-10-CM | POA: Diagnosis not present

## 2023-06-28 DIAGNOSIS — E119 Type 2 diabetes mellitus without complications: Secondary | ICD-10-CM | POA: Diagnosis not present

## 2023-07-02 DIAGNOSIS — J3089 Other allergic rhinitis: Secondary | ICD-10-CM | POA: Diagnosis not present

## 2023-07-02 DIAGNOSIS — J3081 Allergic rhinitis due to animal (cat) (dog) hair and dander: Secondary | ICD-10-CM | POA: Diagnosis not present

## 2023-07-02 DIAGNOSIS — J301 Allergic rhinitis due to pollen: Secondary | ICD-10-CM | POA: Diagnosis not present

## 2023-07-16 DIAGNOSIS — J3081 Allergic rhinitis due to animal (cat) (dog) hair and dander: Secondary | ICD-10-CM | POA: Diagnosis not present

## 2023-07-16 DIAGNOSIS — J3089 Other allergic rhinitis: Secondary | ICD-10-CM | POA: Diagnosis not present

## 2023-07-16 DIAGNOSIS — J301 Allergic rhinitis due to pollen: Secondary | ICD-10-CM | POA: Diagnosis not present

## 2023-07-21 DIAGNOSIS — Z96652 Presence of left artificial knee joint: Secondary | ICD-10-CM | POA: Diagnosis not present

## 2023-07-21 DIAGNOSIS — M25562 Pain in left knee: Secondary | ICD-10-CM | POA: Diagnosis not present

## 2023-07-23 ENCOUNTER — Telehealth: Payer: Self-pay | Admitting: Gastroenterology

## 2023-07-23 NOTE — Telephone Encounter (Signed)
 PT wants to speak about her family history and some concerns she has. She is also upset that her pathology reports from last procedure was not sent to her pcp. Please advise.

## 2023-07-24 NOTE — Telephone Encounter (Signed)
 Left message on machine to call back

## 2023-07-24 NOTE — Telephone Encounter (Signed)
 Spoke with the pt and she states that her sister may have colon cancer and is not sure that she should keep her recall for 7 years.  She was not aware that Dr Aneita retired. She will look on the website and choose another provider and call back to make an office visit to discuss.

## 2023-07-30 ENCOUNTER — Other Ambulatory Visit (HOSPITAL_COMMUNITY): Payer: Self-pay | Admitting: Physician Assistant

## 2023-07-30 DIAGNOSIS — M25562 Pain in left knee: Secondary | ICD-10-CM | POA: Diagnosis not present

## 2023-08-01 DIAGNOSIS — E1169 Type 2 diabetes mellitus with other specified complication: Secondary | ICD-10-CM | POA: Diagnosis not present

## 2023-08-03 DIAGNOSIS — J301 Allergic rhinitis due to pollen: Secondary | ICD-10-CM | POA: Diagnosis not present

## 2023-08-03 DIAGNOSIS — J3089 Other allergic rhinitis: Secondary | ICD-10-CM | POA: Diagnosis not present

## 2023-08-03 DIAGNOSIS — J3081 Allergic rhinitis due to animal (cat) (dog) hair and dander: Secondary | ICD-10-CM | POA: Diagnosis not present

## 2023-08-08 ENCOUNTER — Encounter (HOSPITAL_COMMUNITY)
Admission: RE | Admit: 2023-08-08 | Discharge: 2023-08-08 | Disposition: A | Discharge status: Home / Self Care | Payer: Medicare HMO | Source: Ambulatory Visit | Attending: Physician Assistant | Admitting: Physician Assistant

## 2023-08-08 DIAGNOSIS — Z96653 Presence of artificial knee joint, bilateral: Secondary | ICD-10-CM | POA: Diagnosis not present

## 2023-08-08 DIAGNOSIS — M25562 Pain in left knee: Secondary | ICD-10-CM | POA: Insufficient documentation

## 2023-08-08 MED ORDER — TECHNETIUM TC 99M MEDRONATE IV KIT
21.0000 | PACK | Freq: Once | INTRAVENOUS | Status: AC | PRN
  Administered 2023-08-08: 21 via INTRAVENOUS

## 2023-08-24 DIAGNOSIS — J3089 Other allergic rhinitis: Secondary | ICD-10-CM | POA: Diagnosis not present

## 2023-08-24 DIAGNOSIS — J301 Allergic rhinitis due to pollen: Secondary | ICD-10-CM | POA: Diagnosis not present

## 2023-08-24 DIAGNOSIS — J3081 Allergic rhinitis due to animal (cat) (dog) hair and dander: Secondary | ICD-10-CM | POA: Diagnosis not present

## 2023-08-31 DIAGNOSIS — T84033A Mechanical loosening of internal left knee prosthetic joint, initial encounter: Secondary | ICD-10-CM | POA: Diagnosis not present

## 2023-08-31 DIAGNOSIS — Z96652 Presence of left artificial knee joint: Secondary | ICD-10-CM | POA: Diagnosis not present

## 2023-08-31 DIAGNOSIS — M25562 Pain in left knee: Secondary | ICD-10-CM | POA: Diagnosis not present

## 2023-09-07 DIAGNOSIS — J3089 Other allergic rhinitis: Secondary | ICD-10-CM | POA: Diagnosis not present

## 2023-09-07 DIAGNOSIS — J301 Allergic rhinitis due to pollen: Secondary | ICD-10-CM | POA: Diagnosis not present

## 2023-09-07 DIAGNOSIS — J3081 Allergic rhinitis due to animal (cat) (dog) hair and dander: Secondary | ICD-10-CM | POA: Diagnosis not present

## 2023-09-12 ENCOUNTER — Ambulatory Visit
Admission: RE | Admit: 2023-09-12 | Discharge: 2023-09-12 | Disposition: A | Discharge status: Home / Self Care | Payer: Medicare HMO | Source: Ambulatory Visit | Attending: Family Medicine | Admitting: Family Medicine

## 2023-09-12 DIAGNOSIS — E2839 Other primary ovarian failure: Secondary | ICD-10-CM

## 2023-09-12 DIAGNOSIS — M8588 Other specified disorders of bone density and structure, other site: Secondary | ICD-10-CM | POA: Diagnosis not present

## 2023-09-12 DIAGNOSIS — N958 Other specified menopausal and perimenopausal disorders: Secondary | ICD-10-CM | POA: Diagnosis not present

## 2023-09-21 DIAGNOSIS — J301 Allergic rhinitis due to pollen: Secondary | ICD-10-CM | POA: Diagnosis not present

## 2023-09-21 DIAGNOSIS — J3089 Other allergic rhinitis: Secondary | ICD-10-CM | POA: Diagnosis not present

## 2023-09-21 DIAGNOSIS — J3081 Allergic rhinitis due to animal (cat) (dog) hair and dander: Secondary | ICD-10-CM | POA: Diagnosis not present

## 2023-10-05 DIAGNOSIS — J3089 Other allergic rhinitis: Secondary | ICD-10-CM | POA: Diagnosis not present

## 2023-10-05 DIAGNOSIS — J301 Allergic rhinitis due to pollen: Secondary | ICD-10-CM | POA: Diagnosis not present

## 2023-10-05 DIAGNOSIS — J3081 Allergic rhinitis due to animal (cat) (dog) hair and dander: Secondary | ICD-10-CM | POA: Diagnosis not present

## 2023-10-19 DIAGNOSIS — J3081 Allergic rhinitis due to animal (cat) (dog) hair and dander: Secondary | ICD-10-CM | POA: Diagnosis not present

## 2023-10-19 DIAGNOSIS — J301 Allergic rhinitis due to pollen: Secondary | ICD-10-CM | POA: Diagnosis not present

## 2023-10-19 DIAGNOSIS — J3089 Other allergic rhinitis: Secondary | ICD-10-CM | POA: Diagnosis not present

## 2023-10-21 NOTE — Progress Notes (Signed)
 COVID Vaccine received:  []  No [x]  Yes Date of any COVID positive Test in last 90 days:  PCP - Tally Joe, MD [phone] Cardiologist -   Chest x-ray - 10-15-2021   2v  Epic EKG -  10-17-21 Epic  will repeat Stress Test -  ECHO - 05-16-2019 Epic Cardiac Cath -  CT cardiac calcium score of 8.5 on 07-03-2019  Epic  PCR screen: [x]  Ordered & Completed []   No Order but Needs PROFEND     []   N/A for this surgery  Surgery Plan:  []  Ambulatory   []  Outpatient in bed  [x]  Admit Anesthesia:    []  General  [x]  Spinal  []   Choice []   MAC  Pacemaker / ICD device [x]  No []  Yes   Spinal Cord Stimulator:[x]  No []  Yes       History of Sleep Apnea? []  No [x]  Yes   CPAP used?- []  No []  Yes    Does the patient monitor blood sugar?   []  N/A   []  No []  Yes  Patient has: []  NO Hx DM   []  Pre-DM   []  DM1  [x]   DM2 Last A1c was: 5.5 Per Ashley's note on  10-19-23 ?    Does patient have a [employer] or Dexcom? []  No []  Yes   Fasting Blood Sugar Ranges-  Checks Blood Sugar _____ times a day NO Medications at this time  Blood Thinner / Instructions:  none Aspirin Instructions: none  ERAS Protocol Ordered: []  No  [x]  Yes PRE-SURGERY []  ENSURE  [x]  G2    Patient is to be NPO after: 0430  Dental hx: []  Dentures:  []  N/A      []  Bridge or Partial:                   []  Loose or Damaged teeth:   Comments: Patient was given the 5 CHG shower / bath instructions for TKA revision surgery along with 2 bottles of the CHG soap. Patient will start this on:  10-28-23 All questions were asked and answered, Patient voiced understanding of this process.   Activity level: Patient is able / unable to climb a flight of stairs without difficulty; []  No CP  []  No SOB, but would have ___   Patient can / can not perform ADLs without assistance.   Anesthesia review: DM2 (no meds), HTN, GERD, OSA-CPAP,  s/p ACDF C7-T1 (2019),  GAD  Patient denies shortness of breath, fever, cough and chest pain at PAT  appointment.  Patient verbalized understanding and agreement to the Pre-Surgical Instructions that were given to them at this PAT appointment. Patient was also educated of the need to review these PAT instructions again prior to her surgery.I reviewed the appropriate phone numbers to call if they have any and questions or concerns.

## 2023-10-21 NOTE — Patient Instructions (Signed)
 SURGICAL WAITING ROOM VISITATION Patients having surgery or a procedure may have no more than 2 support people in the waiting area - these visitors may rotate in the visitor waiting room.   If the patient needs to stay at the hospital during part of their recovery, the visitor guidelines for inpatient rooms apply.  PRE-OP VISITATION  Pre-op nurse will coordinate an appropriate time for 1 support person to accompany the patient in pre-op.  This support person may not rotate.  This visitor will be contacted when the time is appropriate for the visitor to come back in the pre-op area.  Please refer to the [facility] website for the visitor guidelines for Inpatients (after your surgery is over and you are in a regular room).  You are not required to quarantine at this time prior to your surgery. However, you must do this: Hand Hygiene often Do NOT share personal items Notify your provider if you are in close contact with someone who has COVID or you develop fever 100.4 or greater, new onset of sneezing, cough, sore throat, shortness of breath or body aches.  If you test positive for Covid or have been in contact with anyone that has tested positive in the last 10 days please notify you surgeon.    Your procedure is scheduled on:  THURSDAY  November 01, 2023  Report to [facility] Main Entrance: Leota [facility]sen entrance where the [employer] is available.   Report to admitting at: 05:15    AM  Call this number if you have any questions or problems the morning of surgery [phone]  Do not eat food after Midnight the night prior to your surgery/procedure.  After Midnight you may have the following liquids until  04:30 AM DAY OF SURGERY  Clear Liquid Diet Water Black Coffee (sugar ok, NO MILK/CREAM OR CREAMERS)  Tea (sugar ok, NO MILK/CREAM OR CREAMERS) regular and decaf                             Plain Jell-O  with no fruit (NO RED)                                           Fruit ices  (not with fruit pulp, NO RED)                                     Popsicles (NO RED)                                                                  Juice: NO CITRUS JUICES: only apple, WHITE grape, WHITE cranberry Sports drinks like Gatorade or Powerade (NO RED)                   The day of surgery:  Drink ONE (1) Pre-Surgery G2 at  05:15 AM the morning of surgery. Drink in one sitting. Do not sip.  This drink was given to you during your hospital pre-op appointment visit. Nothing else to drink after completing the  Pre-Surgery G2 : No candy, chewing gum or throat lozenges.    FOLLOW ANY ADDITIONAL PRE OP INSTRUCTIONS YOU RECEIVED FROM YOUR SURGEON'S OFFICE!!!   Oral Hygiene is also important to reduce your risk of infection.        Remember - BRUSH YOUR TEETH THE MORNING OF SURGERY WITH YOUR REGULAR TOOTHPASTE  Do NOT smoke after Midnight the night before surgery.  STOP TAKING all Vitamins, Herbs and supplements 1 week before your surgery.   DO NOT take Furosemide (Lasix) the morning of surgery, 11-01-23.  Take ONLY these medicines the morning of surgery with A SIP OF WATER: oxybutynin, levothyroxine, and you may take Tylenol if needed. You may use you Nasal sprays if needed.  If You have been diagnosed with Sleep Apnea - Bring CPAP mask and tubing day of surgery. We will provide you with a CPAP machine on the day of your surgery.                   You may not have any metal on your body including hair pins, jewelry, and body piercing  Do not wear make-up, lotions, powders, perfumes or deodorant  Do not wear nail polish including gel and S&S, artificial / acrylic nails, or any other type of covering on natural nails including finger and toenails. If you have artificial nails, gel coating, etc., that needs to be removed by a nail salon, Please have this removed prior to surgery. Not doing so may mean that your surgery could be cancelled or delayed if the Surgeon or anesthesia staff  feels like they are unable to monitor you safely.   Do not shave 48 hours prior to surgery to avoid nicks in your skin which may contribute to postoperative infections.   Contacts, Hearing Aids, dentures or bridgework may not be worn into surgery. DENTURES WILL BE REMOVED PRIOR TO SURGERY PLEASE DO NOT APPLY "Poly grip" OR ADHESIVES!!!  You may bring a small overnight bag with you on the day of surgery, only pack items that are not valuable. [facility] IS NOT RESPONSIBLE   FOR VALUABLES THAT ARE LOST OR STOLEN.   Do not bring your home medications to the hospital. The Pharmacy will dispense medications listed on your medication list to you during your admission in the Hospital.  Special Instructions: Bring a copy of your healthcare power of attorney and living will documents the day of surgery, if you wish to have them scanned into your [facility]- EPIC  Please read over the following fact sheets you were given: IF YOU HAVE QUESTIONS ABOUT YOUR PRE-OP INSTRUCTIONS, PLEASE CALL [phone].     Pre-operative 5 CHG Bath Instructions   You can play a key role in reducing the risk of infection after surgery. Your skin needs to be as free of germs as possible. You can reduce the number of germs on your skin by washing with CHG (chlorhexidine gluconate) soap before surgery. CHG is an antiseptic soap that kills germs and continues to kill germs even after washing.   DO NOT use if you have an allergy to chlorhexidine/CHG or antibacterial soaps. If your skin becomes reddened or irritated, stop using the CHG and notify one of our RNs at [phone]  Please shower with the CHG soap starting 4 days before surgery using the following schedule: START SHOWERS ON   SUNDAY  November 01, 2023  Please keep in mind the  following:  DO NOT shave, including legs and underarms, starting the day of your first shower.   You may shave your face at any point before/day of surgery.   Place clean sheets on your bed the day you start using CHG soap. Use a clean washcloth (not used since being washed) for each shower. DO NOT sleep with pets once you start using the CHG.   CHG Shower Instructions:  If you choose to wash your hair and private area, wash first with your normal shampoo/soap.  After you use shampoo/soap, rinse your hair and body thoroughly to remove shampoo/soap residue.  Turn the water OFF and apply about 3 tablespoons (45 ml) of CHG soap to a CLEAN washcloth.  Apply CHG soap ONLY FROM YOUR NECK DOWN TO YOUR TOES (washing for 3-5 minutes)  DO NOT use CHG soap on face, private areas, open wounds, or sores.  Pay special attention to the area where your surgery is being performed.  If you are having back surgery, having someone wash your back for you may be helpful.  Wait 2 minutes after CHG soap is applied, then you may rinse off the CHG soap.  Pat dry with a clean towel  Put on clean clothes/pajamas   If you choose to wear lotion, please use ONLY the CHG-compatible lotions on the back of this paper.     Additional instructions for the day of surgery: DO NOT APPLY any lotions, deodorants, cologne, or perfumes.   Put on clean/comfortable clothes.  Brush your teeth.  Ask your nurse before applying any prescription medications to the skin.      CHG Compatible Lotions   Aveeno Moisturizing lotion  Cetaphil Moisturizing Cream  Cetaphil Moisturizing Lotion  Clairol Herbal Essence Moisturizing Lotion, Dry Skin  Clairol Herbal Essence Moisturizing Lotion, Extra Dry Skin  Clairol Herbal Essence Moisturizing Lotion, Normal Skin  Curel Age Defying Therapeutic Moisturizing Lotion with Alpha Hydroxy  Curel Extreme Care Body Lotion  Curel Soothing Hands Moisturizing Hand Lotion  Curel Therapeutic  Moisturizing Cream, Fragrance-Free  Curel Therapeutic Moisturizing Lotion, Fragrance-Free  Curel Therapeutic Moisturizing Lotion, Original Formula  Eucerin Daily Replenishing Lotion  Eucerin Dry Skin Therapy Plus Alpha Hydroxy Crme  Eucerin Dry Skin Therapy Plus Alpha Hydroxy Lotion  Eucerin Original Crme  Eucerin Original Lotion  Eucerin Plus Crme Eucerin Plus Lotion  Eucerin TriLipid Replenishing Lotion  Keri Anti-Bacterial Hand Lotion  Keri Deep Conditioning Original Lotion Dry Skin Formula Softly Scented  Keri Deep Conditioning Original Lotion, Fragrance Free Sensitive Skin Formula  Keri Lotion Fast Absorbing Fragrance Free Sensitive Skin Formula  Keri Lotion Fast Absorbing Softly Scented Dry Skin Formula  Keri Original Lotion  Keri Skin Renewal Lotion Keri Silky Smooth Lotion  Keri Silky Smooth Sensitive Skin Lotion  Nivea Body Creamy Conditioning Oil  Nivea Body Extra Enriched Lotion  Nivea Body Original Lotion  Nivea Body Sheer Moisturizing Lotion Nivea Crme  Nivea Skin Firming Lotion  NutraDerm 30 Skin Lotion  NutraDerm Skin Lotion  NutraDerm Therapeutic Skin Cream  NutraDerm Therapeutic Skin Lotion  ProShield Protective Hand Cream  Provon moisturizing lotion   FAILURE TO FOLLOW THESE INSTRUCTIONS MAY RESULT IN THE CANCELLATION OF YOUR SURGERY  PATIENT SIGNATURE_________________________________  NURSE SIGNATURE__________________________________  ________________________________________________________________________         [name]    An incentive spirometer is a tool that can help keep your lungs clear and active. This tool measures how well you are filling your lungs with each  breath. Taking long deep breaths may help reverse or decrease the chance of developing breathing (pulmonary) problems (especially infection) following: A long period of time when you are unable to move or be active. BEFORE THE PROCEDURE  If the spirometer includes  an indicator to show your best effort, your nurse or respiratory therapist will set it to a desired goal. If possible, sit up straight or lean slightly forward. Try not to slouch. Hold the incentive spirometer in an upright position. INSTRUCTIONS FOR USE  Sit on the edge of your bed if possible, or sit up as far as you can in bed or on a chair. Hold the incentive spirometer in an upright position. Breathe out normally. Place the mouthpiece in your mouth and seal your lips tightly around it. Breathe in slowly and as deeply as possible, raising the piston or the ball toward the top of the column. Hold your breath for 3-5 seconds or for as long as possible. Allow the piston or ball to fall to the bottom of the column. Remove the mouthpiece from your mouth and breathe out normally. Rest for a few seconds and repeat Steps 1 through 7 at least 10 times every 1-2 hours when you are awake. Take your time and take a few normal breaths between deep breaths. The spirometer may include an indicator to show your best effort. Use the indicator as a goal to work toward during each repetition. After each set of 10 deep breaths, practice coughing to be sure your lungs are clear. If you have an incision (the cut made at the time of surgery), support your incision when coughing by placing a pillow or rolled up towels firmly against it. Once you are able to get out of bed, walk around indoors and cough well. You may stop using the incentive spirometer when instructed by your caregiver.  RISKS AND COMPLICATIONS Take your time so you do not get dizzy or light-headed. If you are in pain, you may need to take or ask for pain medication before doing incentive spirometry. It is harder to take a deep breath if you are having pain. AFTER USE Rest and breathe slowly and easily. It can be helpful to keep track of a log of your progress. Your caregiver can provide you with a simple table to help with this. If you are using  the spirometer at home, follow these instructions: SEEK MEDICAL CARE IF:  You are having difficultly using the spirometer. You have trouble using the spirometer as often as instructed. Your pain medication is not giving enough relief while using the spirometer. You develop fever of 100.5 F (38.1 C) or higher.                                                                                                    SEEK IMMEDIATE MEDICAL CARE IF:  You cough up bloody sputum that had not been present before. You develop fever of 102 F (38.9 C) or greater. You develop worsening pain at or near the incision site. MAKE SURE YOU:  Understand these instructions. Will watch  your condition. Will get help right away if you are not doing well or get worse. Document Released: 11/13/2006 Document Revised: 09/25/2011 Document Reviewed: 01/14/2007 [facility] Patient Information 2014 [place], [place].        WHAT IS A BLOOD TRANSFUSION? Blood Transfusion Information  A transfusion is the replacement of blood or some of its parts. Blood is made up of multiple cells which provide different functions. Red blood cells carry oxygen and are used for blood loss replacement. White blood cells fight against infection. Platelets control bleeding. Plasma helps clot blood. Other blood products are available for specialized needs, such as hemophilia or other clotting disorders. BEFORE THE TRANSFUSION  Who gives blood for transfusions?  Healthy volunteers who are fully evaluated to make sure their blood is safe. This is blood bank blood. Transfusion therapy is the safest it has ever been in the practice of medicine. Before blood is taken from a donor, a complete history is taken to make sure that person has no history of diseases nor engages in risky social behavior (examples are intravenous drug use or sexual activity with multiple partners). The donor's travel history is screened to minimize risk of transmitting  infections, such as malaria. The donated blood is tested for signs of infectious diseases, such as HIV and hepatitis. The blood is then tested to be sure it is compatible with you in order to minimize the chance of a transfusion reaction. If you or a relative donates blood, this is often done in anticipation of surgery and is not appropriate for emergency situations. It takes many days to process the donated blood. RISKS AND COMPLICATIONS Although transfusion therapy is very safe and saves many lives, the main dangers of transfusion include:  Getting an infectious disease. Developing a transfusion reaction. This is an allergic reaction to something in the blood you were given. Every precaution is taken to prevent this. The decision to have a blood transfusion has been considered carefully by your caregiver before blood is given. Blood is not given unless the benefits outweigh the risks. AFTER THE TRANSFUSION Right after receiving a blood transfusion, you will usually feel much better and more energetic. This is especially true if your red blood cells have gotten low (anemic). The transfusion raises the level of the red blood cells which carry oxygen, and this usually causes an energy increase. The nurse administering the transfusion will monitor you carefully for complications. HOME CARE INSTRUCTIONS  No special instructions are needed after a transfusion. You may find your energy is better. Speak with your caregiver about any limitations on activity for underlying diseases you may have. SEEK MEDICAL CARE IF:  Your condition is not improving after your transfusion. You develop redness or irritation at the intravenous (IV) site. SEEK IMMEDIATE MEDICAL CARE IF:  Any of the following symptoms occur over the next 12 hours: Shaking chills. You have a temperature by mouth above 102 F (38.9 C), not controlled by medicine. Chest, back, or muscle pain. People around you feel you are not acting correctly  or are confused. Shortness of breath or difficulty breathing. Dizziness and fainting. You get a rash or develop hives. You have a decrease in urine output. Your urine turns a dark color or changes to pink, red, or brown. Any of the following symptoms occur over the next 10 days: You have a temperature by mouth above 102 F (38.9 C), not controlled by medicine. Shortness of breath. Weakness after normal activity. The white part of the eye turns yellow (  jaundice). You have a decrease in the amount of urine or are urinating less often. Your urine turns a dark color or changes to pink, red, or brown. Document Released: 06/30/2000 Document Revised: 09/25/2011 Document Reviewed: 02/17/2008 [facility] Patient Information 2014 [place], [place].  _______________________________________________________________________           If you would like to see a video about joint replacement:   [email or web address]

## 2023-10-22 ENCOUNTER — Other Ambulatory Visit: Payer: Self-pay

## 2023-10-22 ENCOUNTER — Encounter (HOSPITAL_COMMUNITY)
Admission: RE | Admit: 2023-10-22 | Discharge: 2023-10-22 | Disposition: A | Discharge status: Home / Self Care | Source: Ambulatory Visit | Attending: Orthopedic Surgery | Admitting: Orthopedic Surgery

## 2023-10-22 ENCOUNTER — Encounter (HOSPITAL_COMMUNITY): Payer: Self-pay

## 2023-10-22 VITALS — BP 119/78 | HR 54 | Temp 98.9°F | Resp 20 | Ht 62.5 in | Wt 229.0 lb

## 2023-10-22 DIAGNOSIS — Z01818 Encounter for other preprocedural examination: Secondary | ICD-10-CM | POA: Insufficient documentation

## 2023-10-22 DIAGNOSIS — I10 Essential (primary) hypertension: Secondary | ICD-10-CM | POA: Insufficient documentation

## 2023-10-22 DIAGNOSIS — E119 Type 2 diabetes mellitus without complications: Secondary | ICD-10-CM | POA: Insufficient documentation

## 2023-10-22 DIAGNOSIS — R001 Bradycardia, unspecified: Secondary | ICD-10-CM | POA: Insufficient documentation

## 2023-10-22 DIAGNOSIS — M1712 Unilateral primary osteoarthritis, left knee: Secondary | ICD-10-CM | POA: Insufficient documentation

## 2023-10-22 HISTORY — DX: Methicillin resistant Staphylococcus aureus infection, unspecified site: A49.02

## 2023-10-22 LAB — BASIC METABOLIC PANEL WITH GFR
Anion gap: 10 (ref 5–15)
BUN: 20 mg/dL (ref 6–20)
CO2: 28 mmol/L (ref 22–32)
Calcium: 9.5 mg/dL (ref 8.9–10.3)
Chloride: 105 mmol/L (ref 98–111)
Creatinine, Ser: 0.62 mg/dL (ref 0.44–1.00)
GFR, Estimated: >60 mL/min (ref >60)
Glucose, Bld: 103 mg/dL — ABNORMAL HIGH (ref 70–99)
Potassium: 3.7 mmol/L (ref 3.5–5.1)
Sodium: 143 mmol/L (ref 135–145)

## 2023-10-22 LAB — GLUCOSE, CAPILLARY: Glucose-Capillary: 108 mg/dL — ABNORMAL HIGH (ref 70–99)

## 2023-10-22 LAB — CBC
HCT: 44.2 % (ref 36.0–46.0)
Hemoglobin: 14.2 g/dL (ref 12.0–15.0)
MCH: 29.9 pg (ref 26.0–34.0)
MCHC: 32.1 g/dL (ref 30.0–36.0)
MCV: 93.1 fL (ref 80.0–100.0)
Platelets: 197 10*3/uL (ref 150–400)
RBC: 4.75 MIL/uL (ref 3.87–5.11)
RDW: 13.5 % (ref 11.5–15.5)
WBC: 6.5 10*3/uL (ref 4.0–10.5)
nRBC: 0 % (ref 0.0–0.2)

## 2023-10-22 LAB — SURGICAL PCR SCREEN
MRSA, PCR: NEGATIVE
Staphylococcus aureus: NEGATIVE

## 2023-10-23 NOTE — Anesthesia Preprocedure Evaluation (Addendum)
 Anesthesia Evaluation  Patient identified by MRN, date of birth, ID band Patient awake    Reviewed: Allergy & Precautions, NPO status , Patient's Chart, lab work & pertinent test results  History of Anesthesia Complications (+) DIFFICULT AIRWAY  Airway Mallampati: II  TM Distance: >3 FB Neck ROM: Full    Dental  (+) Dental Advisory Given   Pulmonary sleep apnea (does not use CPAP) , COPD,  COPD inhaler   breath sounds clear to auscultation       Cardiovascular hypertension, Pt. on medications (-) angina  Rhythm:Regular Rate:Normal  '20 ECHO: EF 60-65%, normal LVF, normal RVF, no significant valvular abnormalities   Neuro/Psych  PSYCHIATRIC DISORDERS (PTSD) Anxiety Depression    negative neurological ROS     GI/Hepatic Neg liver ROS,GERD  Medicated and Controlled,,  Endo/Other  diabetesHypothyroidism  Class 3 obesityPCOS BMI 41  Renal/GU negative Renal ROS     Musculoskeletal  (+) Arthritis , Osteoarthritis,  Fibromyalgia -  Abdominal   Peds  Hematology Hb 14.2, plt 197k   Anesthesia Other Findings   Reproductive/Obstetrics                             Anesthesia Physical Anesthesia Plan  ASA: 3  Anesthesia Plan: Spinal   Post-op Pain Management: Regional block* and Tylenol PO (pre-op)*   Induction:   PONV Risk Score and Plan: 2 and Treatment may vary due to age or medical condition  Airway Management Planned: Natural Airway and Simple Face Mask  Additional Equipment: None  Intra-op Plan:   Post-operative Plan:   Informed Consent: I have reviewed the patients History and Physical, chart, labs and discussed the procedure including the risks, benefits and alternatives for the proposed anesthesia with the patient or authorized representative who has indicated his/her understanding and acceptance.     Dental advisory given  Plan Discussed with: CRNA and Surgeon  Anesthesia  Plan Comments: (Plan routine monitors, SAB with adductor canal block for post op analgesia  PAT note by Rudy Costain, PA-C: 58 year old female with pertinent history including HTN, dyslipidemia, diet controlled DM2 (most recent A1c 5.5 per notes from Dr. Marcelle Sergeant office), hypothyroidism, fibromyalgia, GERD on PPI, chest pain (occurred with anxiety, non-ischemic stress test 2012; mild non-obstructive CAD 06/2019 CCTA), asthma, PTSD, anxiety with panic attacks, OSA (severe OSA, inconsistent use of CPAP), spinal surgery (C7-T1 ACDF 03/26/18).  History of difficult intubation.  Per intubation note 03/26/2018 "Comments: DL x 3 with video-laryngoscope, view of posterior cords each time but difficulty passing ETT due to anterior larynx.  Pt sats dropped to 77%, recruited with PPV ( oral airway and two handed mask noted) and attempted intubation again.  Will check air leak prior to extubation, administering Solumedrol after discussing with PharmD and  Dr Marrie Sizer.  Dorina Garin, CRNA."  Glide scope was used electively for intubation on 06/05/2018 without complication.  Preop labs reviewed, WNL.  EKG 10/22/2023: Sinus bradycardia. Rate 55. Possible Anterior infarct , age undetermined  CT Coronary 07/03/19: IMPRESSION: 1. Coronary calcium score of 8.5. This was 54 percentile for age and sex matched control. 2. Normal coronary origin with right dominance. 3. Nonobstructive CAD with mild (25-49%) mixed plaque stenosis in proximal LAD; CADRADS-2.  Echo 05/16/19: IMPRESSIONS  1. Left ventricular ejection fraction, by visual estimation, is 60 to  65%. The left ventricle has normal function. There is no left ventricular  hypertrophy.  2. Left ventricular diastolic parameters are consistent with Grade I  diastolic dysfunction (impaired relaxation).  3. The left ventricle has no regional wall motion abnormalities.  4. Global right ventricle has normal systolic function.The right  ventricular size is normal. No  increase in right ventricular wall  thickness.  5. Left atrial size was normal.  6. Right atrial size was normal.  7. The mitral valve is normal in structure. No evidence of mitral valve  regurgitation. No evidence of mitral stenosis.  8. The tricuspid valve is normal in structure. Tricuspid valve  regurgitation is not demonstrated.  9. The aortic valve is normal in structure. Aortic valve regurgitation is  not visualized. No evidence of aortic valve sclerosis or stenosis.  10. The pulmonic valve was normal in structure. Pulmonic valve  regurgitation is not visualized.  11. The inferior vena cava is normal in size with greater than 50%  respiratory variability, suggesting right atrial pressure of 3 mmHg.   US  Carotid 05/14/19: Summary:  - Right Carotid: Velocities in the right ICA are consistent with a 1-39%  stenosis.  - Left Carotid: Velocities in the left ICA are consistent with a 1-39%  stenosis.  - Vertebrals: Bilateral vertebral arteries demonstrate antegrade flow.  - Subclavians: Normal flow hemodynamics were seen in bilateral subclavian arteries.    )        Anesthesia Quick Evaluation

## 2023-10-23 NOTE — Progress Notes (Signed)
 Anesthesia Chart Review:  58 year old female with pertinent history including HTN, dyslipidemia, diet controlled DM2 (most recent A1c 5.5 per notes from Dr. Nilsa Nutting office), hypothyroidism, fibromyalgia, GERD on PPI, chest pain (occurred with anxiety, non-ischemic stress test 2012; mild non-obstructive CAD 06/2019 CCTA), asthma, PTSD, anxiety with panic attacks, OSA (severe OSA, inconsistent use of CPAP), spinal surgery (C7-T1 ACDF 03/26/18).   History of difficult intubation.  Per intubation note 03/26/2018 "Comments: DL x 3 with video-laryngoscope, view of posterior cords each time but difficulty passing ETT due to anterior larynx.  Pt sats dropped to 77%, recruited with PPV ( oral airway and two handed mask noted) and attempted intubation again.  Will check air leak prior to extubation, administering Solumedrol after discussing with PharmD and  Dr Chilton Si.  Zigmund Gottron, CRNA."  Glide scope was used electively for intubation on 06/05/2018 without complication.  Preop labs reviewed, WNL.  EKG 10/22/2023: Sinus bradycardia. Rate 55. Possible Anterior infarct , age undetermined  CT Coronary 07/03/19: IMPRESSION: 1. Coronary calcium score of 8.5. This was 97 percentile for age and sex matched control. 2. Normal coronary origin with right dominance. 3. Nonobstructive CAD with mild (25-49%) mixed plaque stenosis in proximal LAD; CADRADS-2.   Echo 05/16/19: IMPRESSIONS   1. Left ventricular ejection fraction, by visual estimation, is 60 to  65%. The left ventricle has normal function. There is no left ventricular  hypertrophy.   2. Left ventricular diastolic parameters are consistent with Grade I  diastolic dysfunction (impaired relaxation).   3. The left ventricle has no regional wall motion abnormalities.   4. Global right ventricle has normal systolic function.The right  ventricular size is normal. No increase in right ventricular wall  thickness.   5. Left atrial size was normal.   6. Right atrial  size was normal.   7. The mitral valve is normal in structure. No evidence of mitral valve  regurgitation. No evidence of mitral stenosis.   8. The tricuspid valve is normal in structure. Tricuspid valve  regurgitation is not demonstrated.   9. The aortic valve is normal in structure. Aortic valve regurgitation is  not visualized. No evidence of aortic valve sclerosis or stenosis.  10. The pulmonic valve was normal in structure. Pulmonic valve  regurgitation is not visualized.  11. The inferior vena cava is normal in size with greater than 50%  respiratory variability, suggesting right atrial pressure of 3 mmHg.    US Carotid 05/14/19: Summary:  - Right Carotid: Velocities in the right ICA are consistent with a 1-39%  stenosis.  - Left Carotid: Velocities in the left ICA are consistent with a 1-39%  stenosis.  - Vertebrals:  Bilateral vertebral arteries demonstrate antegrade flow.  - Subclavians: Normal flow hemodynamics were seen in bilateral subclavian arteries.        Zannie Cove [facility] Short Stay Center/Anesthesiology Phone [phone] 10/23/2023 3:41 PM

## 2023-11-01 ENCOUNTER — Encounter (HOSPITAL_COMMUNITY)
Admission: RE | Disposition: A | Discharge status: Home / Self Care | Payer: Self-pay | Source: Home / Self Care | Attending: Orthopedic Surgery

## 2023-11-01 ENCOUNTER — Other Ambulatory Visit: Payer: Self-pay

## 2023-11-01 ENCOUNTER — Inpatient Hospital Stay (HOSPITAL_COMMUNITY): Payer: Self-pay | Admitting: Physician Assistant

## 2023-11-01 ENCOUNTER — Inpatient Hospital Stay (HOSPITAL_COMMUNITY): Admitting: Anesthesiology

## 2023-11-01 ENCOUNTER — Inpatient Hospital Stay (HOSPITAL_COMMUNITY)
Admission: RE | Admit: 2023-11-01 | Discharge: 2023-11-02 | DRG: 467 | Disposition: A | Discharge status: Home / Self Care | Payer: Medicare HMO | Attending: Orthopedic Surgery | Admitting: Orthopedic Surgery

## 2023-11-01 ENCOUNTER — Encounter (HOSPITAL_COMMUNITY): Payer: Self-pay | Admitting: Orthopedic Surgery

## 2023-11-01 DIAGNOSIS — I10 Essential (primary) hypertension: Secondary | ICD-10-CM | POA: Diagnosis not present

## 2023-11-01 DIAGNOSIS — Z96651 Presence of right artificial knee joint: Secondary | ICD-10-CM | POA: Diagnosis not present

## 2023-11-01 DIAGNOSIS — F411 Generalized anxiety disorder: Secondary | ICD-10-CM | POA: Diagnosis present

## 2023-11-01 DIAGNOSIS — J449 Chronic obstructive pulmonary disease, unspecified: Secondary | ICD-10-CM | POA: Diagnosis not present

## 2023-11-01 DIAGNOSIS — T84093A Other mechanical complication of internal left knee prosthesis, initial encounter: Secondary | ICD-10-CM | POA: Diagnosis not present

## 2023-11-01 DIAGNOSIS — Z823 Family history of stroke: Secondary | ICD-10-CM | POA: Diagnosis not present

## 2023-11-01 DIAGNOSIS — K219 Gastro-esophageal reflux disease without esophagitis: Secondary | ICD-10-CM | POA: Diagnosis not present

## 2023-11-01 DIAGNOSIS — G4730 Sleep apnea, unspecified: Secondary | ICD-10-CM | POA: Diagnosis not present

## 2023-11-01 DIAGNOSIS — Z6841 Body Mass Index (BMI) 40.0 and over, adult: Secondary | ICD-10-CM

## 2023-11-01 DIAGNOSIS — Z9841 Cataract extraction status, right eye: Secondary | ICD-10-CM

## 2023-11-01 DIAGNOSIS — Z01818 Encounter for other preprocedural examination: Secondary | ICD-10-CM

## 2023-11-01 DIAGNOSIS — Z96652 Presence of left artificial knee joint: Principal | ICD-10-CM

## 2023-11-01 DIAGNOSIS — G8918 Other acute postprocedural pain: Secondary | ICD-10-CM | POA: Diagnosis not present

## 2023-11-01 DIAGNOSIS — Z981 Arthrodesis status: Secondary | ICD-10-CM | POA: Diagnosis not present

## 2023-11-01 DIAGNOSIS — E039 Hypothyroidism, unspecified: Secondary | ICD-10-CM | POA: Diagnosis present

## 2023-11-01 DIAGNOSIS — N3281 Overactive bladder: Secondary | ICD-10-CM | POA: Diagnosis present

## 2023-11-01 DIAGNOSIS — E785 Hyperlipidemia, unspecified: Secondary | ICD-10-CM | POA: Diagnosis present

## 2023-11-01 DIAGNOSIS — M797 Fibromyalgia: Secondary | ICD-10-CM | POA: Diagnosis present

## 2023-11-01 DIAGNOSIS — Y792 Prosthetic and other implants, materials and accessory orthopedic devices associated with adverse incidents: Secondary | ICD-10-CM | POA: Diagnosis not present

## 2023-11-01 DIAGNOSIS — E119 Type 2 diabetes mellitus without complications: Secondary | ICD-10-CM | POA: Diagnosis not present

## 2023-11-01 DIAGNOSIS — M810 Age-related osteoporosis without current pathological fracture: Secondary | ICD-10-CM | POA: Diagnosis not present

## 2023-11-01 DIAGNOSIS — Z9842 Cataract extraction status, left eye: Secondary | ICD-10-CM | POA: Diagnosis not present

## 2023-11-01 DIAGNOSIS — F4310 Post-traumatic stress disorder, unspecified: Secondary | ICD-10-CM | POA: Diagnosis not present

## 2023-11-01 DIAGNOSIS — M25562 Pain in left knee: Secondary | ICD-10-CM | POA: Diagnosis not present

## 2023-11-01 DIAGNOSIS — G4733 Obstructive sleep apnea (adult) (pediatric): Secondary | ICD-10-CM | POA: Diagnosis present

## 2023-11-01 DIAGNOSIS — M1712 Unilateral primary osteoarthritis, left knee: Secondary | ICD-10-CM | POA: Diagnosis not present

## 2023-11-01 HISTORY — PX: TOTAL KNEE REVISION: SHX996

## 2023-11-01 LAB — TYPE AND SCREEN
ABO/RH(D): O NEG
Antibody Screen: NEGATIVE

## 2023-11-01 LAB — GLUCOSE, CAPILLARY
Glucose-Capillary: 95 mg/dL (ref 70–99)
Glucose-Capillary: 100 mg/dL — ABNORMAL HIGH (ref 70–99)
Glucose-Capillary: 95 mg/dL (ref 70–99)
Glucose-Capillary: 93 mg/dL (ref 70–99)

## 2023-11-01 SURGERY — TOTAL KNEE REVISION
Anesthesia: Spinal | Site: Knee | Laterality: Left

## 2023-11-01 MED ORDER — LACTATED RINGERS IV SOLN: INTRAVENOUS | Status: DC

## 2023-11-01 MED ORDER — KETOROLAC TROMETHAMINE 30 MG/ML IJ SOLN
INTRAMUSCULAR | Status: AC
  Filled 2023-11-01: qty 1

## 2023-11-01 MED ORDER — VANCOMYCIN HCL 1000 MG IV SOLR
INTRAVENOUS | Status: AC
  Filled 2023-11-01: qty 80

## 2023-11-01 MED ORDER — BUPIVACAINE IN DEXTROSE 0.75-8.25 % IT SOLN
INTRATHECAL | Status: DC | PRN
  Administered 2023-11-01: 12 mg via INTRATHECAL

## 2023-11-01 MED ORDER — SODIUM CHLORIDE (PF) 0.9 % IJ SOLN
INTRAMUSCULAR | Status: DC | PRN
  Administered 2023-11-01: 61 mL

## 2023-11-01 MED ORDER — MIDAZOLAM HCL 2 MG/2ML IJ SOLN: 0.5000 mg | Freq: Once | INTRAMUSCULAR | Status: DC | PRN

## 2023-11-01 MED ORDER — MEPERIDINE HCL 50 MG/ML IJ SOLN: 6.2500 mg | INTRAMUSCULAR | Status: DC | PRN

## 2023-11-01 MED ORDER — TOBRAMYCIN SULFATE 1.2 G IJ SOLR
INTRAMUSCULAR | Status: AC
  Filled 2023-11-01: qty 4.8

## 2023-11-01 MED ORDER — HYDROMORPHONE HCL 1 MG/ML IJ SOLN: 0.5000 mg | INTRAMUSCULAR | Status: DC | PRN

## 2023-11-01 MED ORDER — BUPIVACAINE-EPINEPHRINE (PF) 0.25% -1:200000 IJ SOLN
INTRAMUSCULAR | Status: AC
  Filled 2023-11-01: qty 30

## 2023-11-01 MED ORDER — OXYCODONE HCL 5 MG PO TABS
5.0000 mg | ORAL_TABLET | ORAL | Status: DC | PRN
  Administered 2023-11-01 – 2023-11-02 (×2): 5 mg via ORAL
  Administered 2023-11-02: 10 mg via ORAL
  Filled 2023-11-01 (×2): qty 1
  Filled 2023-11-01: qty 2

## 2023-11-01 MED ORDER — TRANEXAMIC ACID-NACL 1000-0.7 MG/100ML-% IV SOLN
1000.0000 mg | Freq: Once | INTRAVENOUS | Status: AC
  Administered 2023-11-01: 1000 mg via INTRAVENOUS
  Filled 2023-11-01: qty 100

## 2023-11-01 MED ORDER — TRANEXAMIC ACID-NACL 1000-0.7 MG/100ML-% IV SOLN
1000.0000 mg | INTRAVENOUS | Status: AC
  Administered 2023-11-01: 1000 mg via INTRAVENOUS
  Filled 2023-11-01: qty 100

## 2023-11-01 MED ORDER — PANTOPRAZOLE SODIUM 40 MG PO TBEC
40.0000 mg | DELAYED_RELEASE_TABLET | Freq: Every day | ORAL | Status: DC
  Administered 2023-11-01 – 2023-11-02 (×2): 40 mg via ORAL
  Filled 2023-11-01 (×2): qty 1

## 2023-11-01 MED ORDER — BISACODYL 10 MG RE SUPP: 10.0000 mg | Freq: Every day | RECTAL | Status: DC | PRN

## 2023-11-01 MED ORDER — OXYCODONE HCL 5 MG/5ML PO SOLN: 5.0000 mg | Freq: Once | ORAL | Status: DC | PRN

## 2023-11-01 MED ORDER — SENNA 8.6 MG PO TABS
2.0000 | ORAL_TABLET | Freq: Every day | ORAL | Status: DC
  Administered 2023-11-01: 17.2 mg via ORAL
  Filled 2023-11-01: qty 2

## 2023-11-01 MED ORDER — CEFAZOLIN SODIUM-DEXTROSE 2-4 GM/100ML-% IV SOLN
2.0000 g | Freq: Four times a day (QID) | INTRAVENOUS | Status: AC
  Administered 2023-11-01 (×2): 2 g via INTRAVENOUS
  Filled 2023-11-01 (×2): qty 100

## 2023-11-01 MED ORDER — SODIUM CHLORIDE (PF) 0.9 % IJ SOLN
INTRAMUSCULAR | Status: AC
  Filled 2023-11-01: qty 30

## 2023-11-01 MED ORDER — ATORVASTATIN CALCIUM 10 MG PO TABS
10.0000 mg | ORAL_TABLET | Freq: Every day | ORAL | Status: DC
  Filled 2023-11-01: qty 1

## 2023-11-01 MED ORDER — CHLORHEXIDINE GLUCONATE 0.12 % MT SOLN
15.0000 mL | Freq: Once | OROMUCOSAL | Status: AC
  Administered 2023-11-01: 15 mL via OROMUCOSAL

## 2023-11-01 MED ORDER — MIDAZOLAM HCL 2 MG/2ML IJ SOLN
INTRAMUSCULAR | Status: AC
  Filled 2023-11-01: qty 2

## 2023-11-01 MED ORDER — STERILE WATER FOR IRRIGATION IR SOLN
Status: DC | PRN
  Administered 2023-11-01: 1000 mL

## 2023-11-01 MED ORDER — ROPIVACAINE HCL 7.5 MG/ML IJ SOLN
INTRAMUSCULAR | Status: DC | PRN
  Administered 2023-11-01: 20 mL via PERINEURAL

## 2023-11-01 MED ORDER — ONDANSETRON HCL 4 MG/2ML IJ SOLN: 4.0000 mg | Freq: Four times a day (QID) | INTRAMUSCULAR | Status: DC | PRN

## 2023-11-01 MED ORDER — OXYCODONE HCL 5 MG PO TABS: 10.0000 mg | ORAL_TABLET | ORAL | Status: DC | PRN

## 2023-11-01 MED ORDER — DEXAMETHASONE SODIUM PHOSPHATE 10 MG/ML IJ SOLN
10.0000 mg | Freq: Once | INTRAMUSCULAR | Status: AC
  Administered 2023-11-02: 10 mg via INTRAVENOUS
  Filled 2023-11-01: qty 1

## 2023-11-01 MED ORDER — ASPIRIN 81 MG PO CHEW
81.0000 mg | CHEWABLE_TABLET | Freq: Two times a day (BID) | ORAL | Status: DC
  Administered 2023-11-01 – 2023-11-02 (×2): 81 mg via ORAL
  Filled 2023-11-01 (×2): qty 1

## 2023-11-01 MED ORDER — INSULIN ASPART 100 UNIT/ML IJ SOLN: 0.0000 [IU] | Freq: Three times a day (TID) | INTRAMUSCULAR | Status: DC

## 2023-11-01 MED ORDER — DIPHENHYDRAMINE HCL 12.5 MG/5ML PO ELIX
12.5000 mg | ORAL_SOLUTION | ORAL | Status: DC | PRN
Start: 2023-11-01 — End: 2023-11-02
  Administered 2023-11-02 (×2): 25 mg via ORAL
  Filled 2023-11-01 (×3): qty 10

## 2023-11-01 MED ORDER — MIDAZOLAM HCL 2 MG/2ML IJ SOLN
INTRAMUSCULAR | Status: DC | PRN
  Administered 2023-11-01 (×2): 1 mg via INTRAVENOUS

## 2023-11-01 MED ORDER — SODIUM CHLORIDE 0.9% FLUSH
3.0000 mL | Freq: Two times a day (BID) | INTRAVENOUS | Status: DC
  Administered 2023-11-01: 5 mL via INTRAVENOUS
  Administered 2023-11-02: 10 mL via INTRAVENOUS

## 2023-11-01 MED ORDER — ACETAMINOPHEN 500 MG PO TABS
1000.0000 mg | ORAL_TABLET | Freq: Four times a day (QID) | ORAL | Status: DC
  Administered 2023-11-01 – 2023-11-02 (×4): 1000 mg via ORAL
  Filled 2023-11-01 (×4): qty 2

## 2023-11-01 MED ORDER — CEFAZOLIN SODIUM-DEXTROSE 2-4 GM/100ML-% IV SOLN
2.0000 g | INTRAVENOUS | Status: AC
  Administered 2023-11-01: 2 g via INTRAVENOUS
  Filled 2023-11-01: qty 100

## 2023-11-01 MED ORDER — ONDANSETRON HCL 4 MG/2ML IJ SOLN
INTRAMUSCULAR | Status: DC | PRN
  Administered 2023-11-01: 4 mg via INTRAVENOUS

## 2023-11-01 MED ORDER — MENTHOL 3 MG MT LOZG: 1.0000 | LOZENGE | OROMUCOSAL | Status: DC | PRN

## 2023-11-01 MED ORDER — METOCLOPRAMIDE HCL 5 MG/ML IJ SOLN: 5.0000 mg | Freq: Three times a day (TID) | INTRAMUSCULAR | Status: DC | PRN

## 2023-11-01 MED ORDER — PROPOFOL 10 MG/ML IV BOLUS
INTRAVENOUS | Status: AC
  Filled 2023-11-01: qty 20

## 2023-11-01 MED ORDER — PROPOFOL 500 MG/50ML IV EMUL
INTRAVENOUS | Status: DC | PRN
  Administered 2023-11-01: 100 ug/kg/min via INTRAVENOUS

## 2023-11-01 MED ORDER — ZOLPIDEM TARTRATE 5 MG PO TABS: 5.0000 mg | ORAL_TABLET | Freq: Every evening | ORAL | Status: DC | PRN

## 2023-11-01 MED ORDER — TIZANIDINE HCL 4 MG PO TABS
2.0000 mg | ORAL_TABLET | Freq: Three times a day (TID) | ORAL | Status: DC | PRN
Start: 2023-11-01 — End: 2023-11-02
  Administered 2023-11-01 – 2023-11-02 (×2): 4 mg via ORAL
  Filled 2023-11-01 (×2): qty 1

## 2023-11-01 MED ORDER — FENTANYL CITRATE (PF) 100 MCG/2ML IJ SOLN
INTRAMUSCULAR | Status: AC
  Filled 2023-11-01: qty 2

## 2023-11-01 MED ORDER — OXYCODONE HCL 5 MG PO TABS: 5.0000 mg | ORAL_TABLET | Freq: Once | ORAL | Status: DC | PRN

## 2023-11-01 MED ORDER — METOCLOPRAMIDE HCL 5 MG PO TABS: 5.0000 mg | ORAL_TABLET | Freq: Three times a day (TID) | ORAL | Status: DC | PRN

## 2023-11-01 MED ORDER — OXYBUTYNIN CHLORIDE ER 5 MG PO TB24
10.0000 mg | ORAL_TABLET | Freq: Two times a day (BID) | ORAL | Status: DC
  Administered 2023-11-01 – 2023-11-02 (×2): 10 mg via ORAL
  Filled 2023-11-01 (×2): qty 2

## 2023-11-01 MED ORDER — SODIUM CHLORIDE 0.9% FLUSH: 3.0000 mL | INTRAVENOUS | Status: DC | PRN

## 2023-11-01 MED ORDER — MONTELUKAST SODIUM 10 MG PO TABS
10.0000 mg | ORAL_TABLET | Freq: Every day | ORAL | Status: DC
  Administered 2023-11-01: 10 mg via ORAL
  Filled 2023-11-01: qty 1

## 2023-11-01 MED ORDER — LEVOTHYROXINE SODIUM 112 MCG PO TABS
112.0000 ug | ORAL_TABLET | Freq: Every day | ORAL | Status: DC
  Administered 2023-11-02: 112 ug via ORAL
  Filled 2023-11-01: qty 1

## 2023-11-01 MED ORDER — HYDROMORPHONE HCL 1 MG/ML IJ SOLN: 0.2500 mg | INTRAMUSCULAR | Status: DC | PRN

## 2023-11-01 MED ORDER — POLYETHYLENE GLYCOL 3350 17 G PO PACK
17.0000 g | PACK | Freq: Two times a day (BID) | ORAL | Status: DC
  Administered 2023-11-01: 17 g via ORAL
  Filled 2023-11-01 (×2): qty 1

## 2023-11-01 MED ORDER — POVIDONE-IODINE 10 % EX SWAB: 2.0000 | Freq: Once | CUTANEOUS | Status: DC

## 2023-11-01 MED ORDER — FENTANYL CITRATE (PF) 250 MCG/5ML IJ SOLN
INTRAMUSCULAR | Status: DC | PRN
  Administered 2023-11-01: 50 ug via INTRAVENOUS
  Administered 2023-11-01 (×2): 25 ug via INTRAVENOUS

## 2023-11-01 MED ORDER — ALUM & MAG HYDROXIDE-SIMETH 200-200-20 MG/5ML PO SUSP: 30.0000 mL | ORAL | Status: DC | PRN

## 2023-11-01 MED ORDER — FUROSEMIDE 20 MG PO TABS: 20.0000 mg | ORAL_TABLET | Freq: Every day | ORAL | Status: DC | PRN

## 2023-11-01 MED ORDER — ONDANSETRON HCL 4 MG PO TABS: 4.0000 mg | ORAL_TABLET | Freq: Four times a day (QID) | ORAL | Status: DC | PRN

## 2023-11-01 MED ORDER — VANCOMYCIN HCL 1000 MG IV SOLR
INTRAVENOUS | Status: DC | PRN
  Administered 2023-11-01: 1000 mg
  Administered 2023-11-01: 1000 mg via TOPICAL

## 2023-11-01 MED ORDER — ORAL CARE MOUTH RINSE: 15.0000 mL | Freq: Once | OROMUCOSAL | Status: AC

## 2023-11-01 MED ORDER — 0.9 % SODIUM CHLORIDE (POUR BTL) OPTIME
TOPICAL | Status: DC | PRN
  Administered 2023-11-01: 1000 mL

## 2023-11-01 MED ORDER — SODIUM CHLORIDE 0.9 % IR SOLN
Status: DC | PRN
  Administered 2023-11-01: 3000 mL

## 2023-11-01 MED ORDER — PHENOL 1.4 % MT LIQD: 1.0000 | OROMUCOSAL | Status: DC | PRN

## 2023-11-01 MED ORDER — ACETAMINOPHEN 500 MG PO TABS
1000.0000 mg | ORAL_TABLET | Freq: Once | ORAL | Status: DC
  Filled 2023-11-01: qty 2

## 2023-11-01 MED ORDER — INSULIN ASPART 100 UNIT/ML IJ SOLN: 0.0000 [IU] | INTRAMUSCULAR | Status: DC | PRN

## 2023-11-01 SURGICAL SUPPLY — 60 items
ADAPTER BOLT FEMORAL +2/-2 (Knees) IMPLANT
BAG COUNTER SPONGE SURGICOUNT (BAG) IMPLANT
BAG DECANTER FOR FLEXI CONT (MISCELLANEOUS) IMPLANT
BAG ZIPLOCK 12X15 (MISCELLANEOUS) IMPLANT
BLADE SAW SGTL 11.0X1.19X90.0M (BLADE) IMPLANT
BLADE SAW SGTL 13.0X1.19X90.0M (BLADE) ×1 IMPLANT
BLADE SAW SGTL 81X20 HD (BLADE) ×1 IMPLANT
BNDG ELASTIC 6INX 5YD STR LF (GAUZE/BANDAGES/DRESSINGS) ×1 IMPLANT
BOWL SMART MIX CTS (DISPOSABLE) IMPLANT
BRUSH FEMORAL CANAL (MISCELLANEOUS) IMPLANT
CEMENT HV SMART SET (Cement) IMPLANT
CONE SLEEVE ATTUNE KNEE SM (Sleeve) IMPLANT
COVER SURGICAL LIGHT HANDLE (MISCELLANEOUS) ×1 IMPLANT
CUFF TRNQT CYL 34X4.125X (TOURNIQUET CUFF) ×1 IMPLANT
DERMABOND ADVANCED .7 DNX12 (GAUZE/BANDAGES/DRESSINGS) ×1 IMPLANT
DRAPE INCISE IOBAN 66X45 STRL (DRAPES) ×1 IMPLANT
DRAPE U-SHAPE 47X51 STRL (DRAPES) ×1 IMPLANT
DRSG AQUACEL AG ADV 3.5X10 (GAUZE/BANDAGES/DRESSINGS) ×1 IMPLANT
DRSG AQUACEL AG ADV 3.5X14 (GAUZE/BANDAGES/DRESSINGS) IMPLANT
DURAPREP 26ML APPLICATOR (WOUND CARE) ×2 IMPLANT
ELECT REM PT RETURN 15FT ADLT (MISCELLANEOUS) ×1 IMPLANT
FEMORAL ADAPTER (Orthopedic Implant) IMPLANT
FEMORAL PFC TC3 (Orthopedic Implant) IMPLANT
GAUZE SPONGE 2X2 8PLY STRL LF (GAUZE/BANDAGES/DRESSINGS) IMPLANT
GLOVE BIO SURGEON STRL SZ 6 (GLOVE) ×1 IMPLANT
GLOVE BIOGEL PI IND STRL 6.5 (GLOVE) ×1 IMPLANT
GLOVE BIOGEL PI IND STRL 7.5 (GLOVE) ×1 IMPLANT
GLOVE ORTHO TXT STRL SZ7.5 (GLOVE) ×2 IMPLANT
GOWN STRL REUS W/ TWL LRG LVL3 (GOWN DISPOSABLE) ×2 IMPLANT
HOLDER FOLEY CATH W/STRAP (MISCELLANEOUS) IMPLANT
INSERT TIBIAL TC3 RP SZ 2.5 (Knees) IMPLANT
KIT TURNOVER KIT A (KITS) IMPLANT
MANIFOLD NEPTUNE II (INSTRUMENTS) ×1 IMPLANT
NDL SAFETY ECLIPSE 18X1.5 (NEEDLE) ×1 IMPLANT
NS IRRIG 1000ML POUR BTL (IV SOLUTION) ×1 IMPLANT
PACK TOTAL KNEE CUSTOM (KITS) ×1 IMPLANT
PENCIL SMOKE EVACUATOR (MISCELLANEOUS) ×1 IMPLANT
PIN FIX SIGMA LCS THRD HI (PIN) IMPLANT
POST AUG PFC 4MM SZ 2.5 (Knees) IMPLANT
POST AUG PFC SIGMA 8MM 2.5 LT (Knees) IMPLANT
PROTECTOR NERVE ULNAR (MISCELLANEOUS) ×1 IMPLANT
RESTRICTOR CEMENT SZ 5 C-STEM (Cement) IMPLANT
SET HNDPC FAN SPRY TIP SCT (DISPOSABLE) ×1 IMPLANT
SET PAD KNEE POSITIONER (MISCELLANEOUS) ×1 IMPLANT
SOLUTION IRRIG SURGIPHOR (IV SOLUTION) IMPLANT
SPIKE FLUID TRANSFER (MISCELLANEOUS) IMPLANT
STAPLER SKIN PROX WIDE 3.9 (STAPLE) IMPLANT
STEM KNEE SIGMA TAPER 13X90 (Stem) IMPLANT
SUT MNCRL AB 3-0 PS2 18 (SUTURE) ×1 IMPLANT
SUT STRATAFIX PDS+ 0 24IN (SUTURE) ×1 IMPLANT
SUT VIC AB 1 CT1 36 (SUTURE) ×1 IMPLANT
SUT VIC AB 2-0 CT1 TAPERPNT 27 (SUTURE) ×3 IMPLANT
SYR 3ML LL SCALE MARK (SYRINGE) ×1 IMPLANT
TOWER CARTRIDGE SMART MIX (DISPOSABLE) IMPLANT
TRAY FOLEY MTR SLVR 14FR STAT (SET/KITS/TRAYS/PACK) IMPLANT
TRAY FOLEY MTR SLVR 16FR STAT (SET/KITS/TRAYS/PACK) ×1 IMPLANT
TUBE KAMVAC SUCTION (TUBING) IMPLANT
TUBE SUCTION HIGH CAP CLEAR NV (SUCTIONS) ×1 IMPLANT
WATER STERILE IRR 1000ML POUR (IV SOLUTION) ×1 IMPLANT
WRAP KNEE MAXI GEL POST OP (GAUZE/BANDAGES/DRESSINGS) ×1 IMPLANT

## 2023-11-01 NOTE — Anesthesia Procedure Notes (Signed)
 Anesthesia Regional Block: Adductor canal block   Pre-Anesthetic Checklist: , timeout performed,  Correct Patient, Correct Site, Correct Laterality,  Correct Procedure, Correct Position, site marked,  Risks and benefits discussed,  Surgical consent,  Pre-op evaluation,  At surgeon's request and post-op pain management  Laterality: Left and Lower  Prep: chloraprep       Needles:  Injection technique: Single-shot  Needle Type: Echogenic Needle     Needle Length: 9cm  Needle Gauge: 21     Additional Needles:   Procedures:,,,, ultrasound used (permanent image in chart),,    Narrative:  Start time: 11/01/2023 7:01 AM End time: 11/01/2023 7:07 AM Injection made incrementally with aspirations every 5 mL.  Performed by: Personally  Anesthesiologist: Jonne Netters, MD  Additional Notes: Pt identified in Holding room.  Monitors applied. Working IV access confirmed. Timeout, Sterile prep L thigh.  #21ga ECHOgenic Arrow block needle into adductor canal with US  guidance.  20cc 0.75% Ropivacaine injected incrementally after negative test dose.  Patient asymptomatic, VSS, no heme aspirated, tolerated well.   Fay Hoop, MD

## 2023-11-01 NOTE — Transfer of Care (Signed)
 Immediate Anesthesia Transfer of Care Note  Patient: [name]  Procedure(s) Performed: TOTAL KNEE REVISION (Left: Knee)  Patient Location: PACU  Anesthesia Type:MAC, Spinal, and Bier block  Level of Consciousness: awake, alert , and oriented  Airway & Oxygen Therapy: Patient Spontanous Breathing  Post-op Assessment: Report given to RN and Post -op Vital signs reviewed and stable  Post vital signs: Reviewed and stable  Last Vitals:  Vitals Value Taken Time  BP    Temp    Pulse 71 11/01/23 1009  Resp 14 11/01/23 1009  SpO2 95 % 11/01/23 1009  Vitals shown include unfiled device data.  Last Pain:  Vitals:   11/01/23 0610  TempSrc: Oral  PainSc:          Complications: No notable events documented.

## 2023-11-01 NOTE — Anesthesia Postprocedure Evaluation (Signed)
 Anesthesia Post Note  Patient: [name]  Procedure(s) Performed: TOTAL KNEE REVISION (Left: Knee)     Patient location during evaluation: PACU Anesthesia Type: Spinal Level of consciousness: awake and alert, patient cooperative and oriented Pain management: pain level controlled Vital Signs Assessment: post-procedure vital signs reviewed and stable Respiratory status: nonlabored ventilation, spontaneous breathing and respiratory function stable Cardiovascular status: blood pressure returned to baseline and stable Postop Assessment: patient able to bend at knees, spinal receding and no apparent nausea or vomiting Anesthetic complications: no   No notable events documented.  Last Vitals:  Vitals:   11/01/23 1203 11/01/23 1415  BP: 127/77 124/83  Pulse: (!) 59 65  Resp: 18 18  Temp: 36.4 C 36.4 C  SpO2: 100% 100%    Last Pain:  Vitals:   11/01/23 1415  TempSrc: Axillary  PainSc:                  Debera Sterba,E. Zabella Wease

## 2023-11-01 NOTE — Anesthesia Procedure Notes (Signed)
 Spinal  Patient location during procedure: OR End time: 11/01/2023 8:02 AM Reason for block: surgical anesthesia Staffing Performed: anesthesiologist  Anesthesiologist: Jonne Netters, MD Performed by: Jonne Netters, MD Authorized by: Jonne Netters, MD   Preanesthetic Checklist Completed: patient identified, IV checked, site marked, risks and benefits discussed, surgical consent, monitors and equipment checked, pre-op evaluation and timeout performed Spinal Block Patient position: sitting Prep: DuraPrep and site prepped and draped Patient monitoring: blood pressure, continuous pulse ox, cardiac monitor and heart rate Approach: midline Location: L3-4 Injection technique: single-shot Needle Needle type: Pencan and Introducer  Needle gauge: 24 G Needle length: 9 cm Assessment Events: CSF return Additional Notes Pt identified in Operating room.  Monitors applied. Working IV access confirmed. Sterile prep, drape lumbar spine.  1% lido local L 3,4.  #24ga Pencan into clear CSF L 3,4.  12mg  0.75% Bupivacaine with dextrose injected with asp CSF beginning and end of injection.  Patient asymptomatic, VSS, no heme aspirated, tolerated well.  Fay Hoop, MD

## 2023-11-01 NOTE — Discharge Instructions (Signed)

## 2023-11-01 NOTE — Progress Notes (Signed)
   11/01/23 2231  BiPAP/CPAP/SIPAP  BiPAP/CPAP/SIPAP Pt Type Adult  BiPAP/CPAP/SIPAP Resmed  Mask Type Nasal mask  Dentures removed? Not applicable  Mask Size Medium  FiO2 (%) 21 %  Patient Home Machine No  Patient Home Mask Yes  Patient Home Tubing Yes  Auto Titrate Yes  Minimum cmH2O 5 cmH2O  Maximum cmH2O 20 cmH2O  Device Plugged into [employer] Power Outlet Yes

## 2023-11-01 NOTE — Interval H&P Note (Signed)
 History and Physical Interval Note:  11/01/2023 7:39 AM  [name]  has presented today for surgery, with the diagnosis of Failed left total knee arthroplasty.  The various methods of treatment have been discussed with the patient and family. After consideration of risks, benefits and other options for treatment, the patient has consented to  Procedure(s): TOTAL KNEE REVISION (Left) as a surgical intervention.  The patient's history has been reviewed, patient examined, no change in status, stable for surgery.  I have reviewed the patient's chart and labs.  Questions were answered to the patient's satisfaction.     Bevin Bucks

## 2023-11-01 NOTE — Plan of Care (Signed)
   Problem: Education: Goal: Knowledge of General Education information will improve Description: Including pain rating scale, medication(s)/side effects and non-pharmacologic comfort measures Outcome: Progressing   Problem: Activity: Goal: Risk for activity intolerance will decrease Outcome: Progressing   Problem: Pain Managment: Goal: General experience of comfort will improve and/or be controlled Outcome: Progressing

## 2023-11-01 NOTE — Evaluation (Signed)
 Physical Therapy Evaluation Patient Details Name: [name] MRN: [id number] DOB: 08/18/1965 Today's Date: 11/01/2023  History of Present Illness  Pt s/p L TKR revision and with hx of bil TKR, PTSD, morbid obesity, panic atacks, Fibromyalgia, cervical fusion and osteoporosis.  Clinical Impression  Pt s/p L TKR and presents with decreased L LE strength/ROM and post op pain limiting functional mobility.  Pt should progress to dc home with family assist and reports first OP PT scheduled for 11/05/23.      If plan is discharge home, recommend the following: A little help with walking and/or transfers;A little help with bathing/dressing/bathroom;Assistance with cooking/housework;Assist for transportation;Help with stairs or ramp for entrance   Can travel by private vehicle        Equipment Recommendations  (pt checking on RW)  Recommendations for Other Services       Functional Status Assessment Patient has had a recent decline in their functional status and demonstrates the ability to make significant improvements in function in a reasonable and predictable amount of time.     Precautions / Restrictions Precautions Precautions: Knee Restrictions Weight Bearing Restrictions Per Provider Order: No Other Position/Activity Restrictions: WBAT      Mobility  Bed Mobility Overal bed mobility: Needs Assistance Bed Mobility: Supine to Sit     Supine to sit: Contact guard     General bed mobility comments: for safety    Transfers Overall transfer level: Needs assistance Equipment used: Rolling walker (2 wheels) Transfers: Sit to/from Stand Sit to Stand: Min assist, Contact guard assist           General transfer comment: cues for LE management and use of UEs to self assist    Ambulation/Gait Ambulation/Gait assistance: Min assist, Contact guard assist Gait Distance (Feet): 140 Feet Assistive device: Rolling walker (2 wheels) Gait Pattern/deviations: Step-to  pattern, Step-through pattern, Shuffle, Decreased step length - right, Decreased step length - left Gait velocity: dec     General Gait Details: cues for posture, position from RW and initial sequence  Stairs            Wheelchair Mobility     Tilt Bed    Modified Rankin (Stroke Patients Only)       Balance Overall balance assessment: Mild deficits observed, not formally tested                                           Pertinent Vitals/Pain Pain Assessment Pain Assessment: 0-10 Pain Score: 5  Pain Descriptors / Indicators: Aching, Sore Pain Intervention(s): Limited activity within patient's tolerance, Monitored during session, Premedicated before session, Ice applied    Home Living Family/patient expects to be discharged to:: Private residence Living Arrangements: Spouse/significant other Available Help at Discharge: Family Type of Home: House Home Access: Stairs to enter Entrance Stairs-Rails: None Secretary/administrator of Steps: 1   Home Layout: One level Home Equipment:  (checking on RW)      Prior Function Prior Level of Function : Independent/Modified Independent                     Extremity/Trunk Assessment   Upper Extremity Assessment Upper Extremity Assessment: Overall WFL for tasks assessed    Lower Extremity Assessment Lower Extremity Assessment: LLE deficits/detail    Cervical / Trunk Assessment Cervical / Trunk Assessment: Normal  Communication   Communication Communication: No apparent  difficulties    Cognition Arousal: Alert Behavior During Therapy: WFL for tasks assessed/performed   PT - Cognitive impairments: No apparent impairments                         Following commands: Intact       Cueing Cueing Techniques: Verbal cues     General Comments      Exercises Total Joint Exercises Ankle Circles/Pumps: AROM, Supine, Both, 15 reps   Assessment/Plan    PT Assessment Patient  needs continued PT services  PT Problem List Decreased strength;Decreased range of motion;Decreased activity tolerance;Decreased balance;Decreased mobility;Decreased safety awareness;Pain       PT Treatment Interventions DME instruction;Gait training;Stair training;Functional mobility training;Therapeutic activities;Therapeutic exercise;Patient/family education    PT Goals (Current goals can be found in the Care Plan section)  Acute Rehab PT Goals Patient Stated Goal: Regain IND PT Goal Formulation: With patient Time For Goal Achievement: 11/08/23 Potential to Achieve Goals: Good    Frequency 7X/week     Co-evaluation               AM-PAC PT "6 Clicks" Mobility  Outcome Measure Help needed turning from your back to your side while in a flat bed without using bedrails?: A Little Help needed moving from lying on your back to sitting on the side of a flat bed without using bedrails?: A Little Help needed moving to and from a bed to a chair (including a wheelchair)?: A Little Help needed standing up from a chair using your arms (e.g., wheelchair or bedside chair)?: A Little Help needed to walk in hospital room?: A Little Help needed climbing 3-5 steps with a railing? : A Little 6 Click Score: 18    End of Session Equipment Utilized During Treatment: Gait belt Activity Tolerance: Patient tolerated treatment well Patient left: in chair;with call bell/phone within reach;with chair alarm set Nurse Communication: Mobility status PT Visit Diagnosis: Difficulty in walking, not elsewhere classified (R26.2)    Time: 1610-9604 PT Time Calculation (min) (ACUTE ONLY): 24 min   Charges:   PT Evaluation $PT Eval Low Complexity: 1 Low PT Treatments $Gait Training: 8-22 mins PT General Charges $$ ACUTE PT VISIT: 1 Visit         Thedora Finlay PT Acute Rehabilitation Services Pager [phone] Office [phone]   Sharnetta Gielow 11/01/2023, 3:53 PM
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
## 2023-11-01 NOTE — Brief Op Note (Signed)
 11/01/2023  9:43 AM  PATIENT:  [place]  58 y.o. female  PRE-OPERATIVE DIAGNOSIS:  Failed left total knee arthroplasty  POST-OPERATIVE DIAGNOSIS:  Failed left total knee arthroplasty  PROCEDURE:  Procedure(s): TOTAL KNEE REVISION (Left)  SURGEON:  Surgeons and Role:    Claiborne Crew, MD - Primary  PHYSICIAN ASSISTANT: Kim Pen, PA-C  ANESTHESIA:   regional and spinal  EBL:  50 mL   BLOOD ADMINISTERED:none  DRAINS: none   LOCAL MEDICATIONS USED:  MARCAINE plus 1gm of Vancomycin powder  SPECIMEN:  No Specimen  DISPOSITION OF SPECIMEN:  N/A  COUNTS:  YES  TOURNIQUET:   Total Tourniquet Time Documented: Thigh (Left) - 57 minutes Total: Thigh (Left) - 57 minutes   DICTATION: .Other Dictation: Dictation Number [id number]  PLAN OF CARE: Admit to inpatient   PATIENT DISPOSITION:  PACU - hemodynamically stable.   Delay start of Pharmacological VTE agent (>24hrs) due to surgical blood loss or risk of bleeding: no

## 2023-11-02 LAB — CBC
HCT: 35.7 % — ABNORMAL LOW (ref 36.0–46.0)
Hemoglobin: 11.1 g/dL — ABNORMAL LOW (ref 12.0–15.0)
MCH: 30.1 pg (ref 26.0–34.0)
MCHC: 31.1 g/dL (ref 30.0–36.0)
MCV: 96.7 fL (ref 80.0–100.0)
Platelets: 124 10*3/uL — ABNORMAL LOW (ref 150–400)
RBC: 3.69 MIL/uL — ABNORMAL LOW (ref 3.87–5.11)
RDW: 13.5 % (ref 11.5–15.5)
WBC: 7.5 10*3/uL (ref 4.0–10.5)
nRBC: 0 % (ref 0.0–0.2)

## 2023-11-02 LAB — BASIC METABOLIC PANEL WITH GFR
Anion gap: 7 (ref 5–15)
BUN: 21 mg/dL — ABNORMAL HIGH (ref 6–20)
CO2: 26 mmol/L (ref 22–32)
Calcium: 8.7 mg/dL — ABNORMAL LOW (ref 8.9–10.3)
Chloride: 108 mmol/L (ref 98–111)
Creatinine, Ser: 0.89 mg/dL (ref 0.44–1.00)
GFR, Estimated: >60 mL/min (ref >60)
Glucose, Bld: 120 mg/dL — ABNORMAL HIGH (ref 70–99)
Potassium: 4 mmol/L (ref 3.5–5.1)
Sodium: 141 mmol/L (ref 135–145)

## 2023-11-02 LAB — GLUCOSE, CAPILLARY: Glucose-Capillary: 113 mg/dL — ABNORMAL HIGH (ref 70–99)

## 2023-11-02 LAB — HEMOGLOBIN A1C
Hgb A1c MFr Bld: 5.9 % — ABNORMAL HIGH (ref 4.8–5.6)
Mean Plasma Glucose: 123 mg/dL

## 2023-11-02 MED ORDER — POLYETHYLENE GLYCOL 3350 17 G PO PACK: 17.0000 g | PACK | Freq: Two times a day (BID) | ORAL | 0 refills | Status: AC

## 2023-11-02 MED ORDER — TIZANIDINE HCL 2 MG PO TABS
2.0000 mg | ORAL_TABLET | Freq: Three times a day (TID) | ORAL | 2 refills | Status: AC | PRN
Start: 2023-11-02 — End: ?

## 2023-11-02 MED ORDER — ASPIRIN 81 MG PO CHEW: 81.0000 mg | CHEWABLE_TABLET | Freq: Two times a day (BID) | ORAL | 0 refills | Status: AC

## 2023-11-02 MED ORDER — OXYCODONE HCL 5 MG PO TABS
5.0000 mg | ORAL_TABLET | ORAL | 0 refills | Status: AC | PRN
Start: 2023-11-02 — End: ?

## 2023-11-02 MED ORDER — SENNA 8.6 MG PO TABS: 2.0000 | ORAL_TABLET | Freq: Every day | ORAL | 0 refills | Status: AC

## 2023-11-02 NOTE — Plan of Care (Signed)
 Patient discharged home via private vehicle with husband Genessa Beman. AVS and discharge instruction provided. Patient verbalizes understanding. Ara Knee, RN 11/02/23 12:26 PM

## 2023-11-02 NOTE — TOC Initial Note (Addendum)
 Transition of Care [facility]) - Initial/Assessment Note    Patient Details  Name: [name] MRN: [id number] Date of Birth: [birth year]  Transition of Care [facility]) CM/SW Contact:    Levie Ream, RN Phone Number: 11/02/2023, 10:41 AM  Clinical Narrative:                 TOC for d/c planning; spoke w/ pt and husband [name] [phone]) in room; pt says she lives at home; she plans to return at d/c; her husband will provide transportation; pt verified insurance/PCP; pt says they "barely squeak by paying for food, housing, and utilities"; she denies IPV; pt says she has cane, Rolator, walker, walking sticks, BSC, and shower chair; she does not have HH services, or home oxygen ; pt says she has OPPT set up w/ [facility]; she agrees to receive resources for social services, and financial assistance; pt notified resources placed in d/c instructions; she verbalized understanding and will make her own appt w/ agencies of choice; also pt's walker is broken; she agrees to receive new RW; she does not have an agency preference; spoke w/ Kaitlyn at [facility] , and RW will be delivered to room; TOC signing off; please place consult if needed.  Expected Discharge Plan: Home/Self Care Barriers to Discharge: No Barriers Identified   Patient Goals and CMS Choice Patient states their goals for this hospitalization and ongoing recovery are:: home CMS Medicare.gov Compare Post Acute Care list provided to:: Patient        Expected Discharge Plan and Services   Discharge Planning Services: CM Consult Post Acute Care Choice: Durable Medical Equipment Living arrangements for the past 2 months: Single Family Home                 DME Arranged: Walker rolling DME Agency: [employer] Date DME Agency Contacted: 11/02/23 Time DME Agency Contacted: 1038 Representative spoke with at DME Agency: Joleen Navy HH Arranged: NA HH Agency: NA        Prior Living Arrangements/Services Living  arrangements for the past 2 months: Single Family Home Lives with:: Spouse Patient language and need for interpreter reviewed:: Yes Do you feel safe going back to the place where you live?: Yes      Need for Family Participation in Patient Care: Yes (Comment) Care giver support system in place?: Yes (comment) Current home services: DME (cane, Rolator, walker, BSC, shower chair) Criminal Activity/Legal Involvement Pertinent to Current Situation/Hospitalization: No - Comment as needed  Activities of Daily Living   ADL Screening (condition at time of admission) Independently performs ADLs?: Yes (appropriate for developmental age) Is the patient deaf or have difficulty hearing?: No Does the patient have difficulty seeing, even when wearing glasses/contacts?: Yes Does the patient have difficulty concentrating, remembering, or making decisions?: No  Permission Sought/Granted Permission sought to share information with : Case Manager Permission granted to share information with : Yes, Verbal Permission Granted  Share Information with NAME: Case Manager     Permission granted to share info w Relationship: Vylet Maffia (spouse) [phone]     Emotional Assessment Appearance:: Appears stated age Attitude/Demeanor/Rapport: Gracious Affect (typically observed): Accepting Orientation: : Oriented to Self, Oriented to Place, Oriented to  Time, Oriented to Situation Alcohol / Substance Use: Not Applicable Psych Involvement: No (comment)  Admission diagnosis:  S/P revision of total knee, left [Z96.652] Patient Active Problem List   Diagnosis Date Noted   S/P revision of total knee, left 11/01/2023   History of colonic polyps 07/20/2022  Benign neoplasm of descending colon 07/20/2022   Transient synovitis of left elbow 07/25/2021   Hallux limitus of right foot 12/17/2019   Hallux limitus, acquired, right 12/17/2019   Essential hypertension 05/13/2019   Gastroesophageal reflux disease  without esophagitis 02/17/2019   Hoarseness 02/17/2019   Obstructive sleep apnea 02/17/2019   Pharyngoesophageal dysphagia 02/17/2019   Pain in right knee 10/01/2018   Closed fracture of base of fifth metatarsal bone 05/13/2018   Cervical radiculopathy 03/26/2018   Aftercare 11/13/2017   Ulnar nerve entrapment at elbow 09/15/2017   Arthritis of carpometacarpal [facility]) joint of left thumb 08/17/2017   Arthritis of right hand 08/17/2017   HNP (herniated nucleus pulposus), lumbar 01/26/2016   Spinal stenosis of lumbar region 01/26/2016   Severe episode of recurrent major depressive disorder, without psychotic features (HCC) 05/11/2015   GAD (generalized anxiety disorder) 05/11/2015   Rotator cuff tear, non-traumatic 07/16/2014   Rotator cuff impingement syndrome 07/16/2014   Acute blood loss anemia 03/06/2013   Osteoarthritis of left knee 03/04/2013   S/P right TKA 09/21/2012   Chest pain    Ejection fraction    Tachycardia    Anxiety    Dyslipidemia    Hypothyroidism    Depression    Fibromyalgia    Dizziness    PCP:  Rae Bugler, MD Pharmacy:   [facility] [address] [place], [place] - [address] [place] [address] Phone: [phone] Fax: [phone]     Social Drivers of Health (SDOH) Social History: SDOH Screenings   Food Insecurity: No Food Insecurity (11/02/2023)  Housing: Low Risk  (11/02/2023)  Transportation Needs: No Transportation Needs (11/02/2023)  Utilities: Not At Risk (11/02/2023)  Depression (PHQ2-9): Low Risk  (03/03/2021)  Social Connections: Patient Declined (11/01/2023)  Tobacco Use: Low Risk  (11/01/2023)   SDOH Interventions: Food Insecurity Interventions: Intervention Not Indicated, Inpatient TOC Housing Interventions: Intervention Not Indicated, Inpatient TOC Transportation Interventions: Intervention Not Indicated, Inpatient TOC Utilities Interventions: Intervention Not Indicated, Inpatient  TOC   Readmission Risk Interventions     No data to display
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
## 2023-11-02 NOTE — Progress Notes (Signed)
 Physical Therapy Treatment Patient Details Name: [name] MRN: [id number] DOB: [birth year] Today's Date: 11/02/2023   History of Present Illness Pt s/p L TKR revision and with hx of bil TKR, PTSD, morbid obesity, panic atacks, Fibromyalgia, cervical fusion and osteoporosis.    PT Comments  Pt motivated and progressing well with mobility including up to bathroom for toileting, ambulated in hallway, negotiated stairs, and performed HEP with written instruction provided.  Pt requesting PT return to review with spouse later this am.   If plan is discharge home, recommend the following: A little help with walking and/or transfers;A little help with bathing/dressing/bathroom;Assistance with cooking/housework;Assist for transportation;Help with stairs or ramp for entrance   Can travel by private vehicle        Equipment Recommendations  None recommended by PT    Recommendations for Other Services       Precautions / Restrictions Precautions Precautions: Knee Restrictions Weight Bearing Restrictions Per Provider Order: No Other Position/Activity Restrictions: WBAT     Mobility  Bed Mobility Overal bed mobility: Needs Assistance Bed Mobility: Supine to Sit, Sit to Supine     Supine to sit: Supervision Sit to supine: Contact guard assist   General bed mobility comments: Pt self assisting with gait belt    Transfers Overall transfer level: Needs assistance Equipment used: Rolling walker (2 wheels) Transfers: Sit to/from Stand Sit to Stand: Contact guard assist, Supervision           General transfer comment: cues for LE management and use of UEs to self assist; up/down from EOB and comode    Ambulation/Gait Ambulation/Gait assistance: Contact guard assist Gait Distance (Feet): 75 Feet Assistive device: Rolling walker (2 wheels) Gait Pattern/deviations: Step-to pattern, Step-through pattern, Shuffle, Decreased step length - right, Decreased step length -  left Gait velocity: decr     General Gait Details: increased time with cues for posture, position from RW and initial sequence   Stairs Stairs: Yes Stairs assistance: Min assist Stair Management: No rails, Step to pattern, Backwards, With walker Number of Stairs: 3 General stair comments: single step x 3 with cues for sequence and foot/RW placement   Wheelchair Mobility     Tilt Bed    Modified Rankin (Stroke Patients Only)       Balance Overall balance assessment: Mild deficits observed, not formally tested                                          Communication Communication Communication: No apparent difficulties  Cognition Arousal: Alert Behavior During Therapy: WFL for tasks assessed/performed   PT - Cognitive impairments: No apparent impairments                         Following commands: Intact      Cueing Cueing Techniques: Verbal cues  Exercises Total Joint Exercises Ankle Circles/Pumps: AROM, Supine, Both, 15 reps Quad Sets: AROM, Both, 10 reps, Supine Short Arc Quad: AAROM, Left, 10 reps, Supine Heel Slides: AAROM, Left, 15 reps, Supine Straight Leg Raises: AAROM, Left, 10 reps, Supine    General Comments        Pertinent Vitals/Pain Pain Assessment Pain Assessment: 0-10 Pain Score: 5  Pain Descriptors / Indicators: Aching, Sore Pain Intervention(s): Limited activity within patient's tolerance, Monitored during session, Premedicated before session, Ice applied    Home Living  Prior Function            PT Goals (current goals can now be found in the care plan section) Acute Rehab PT Goals Patient Stated Goal: Regain IND PT Goal Formulation: With patient Time For Goal Achievement: 11/08/23 Potential to Achieve Goals: Good Progress towards PT goals: Progressing toward goals    Frequency    7X/week      PT Plan      Co-evaluation              AM-PAC PT 6  Clicks Mobility   Outcome Measure  Help needed turning from your back to your side while in a flat bed without using bedrails?: A Little Help needed moving from lying on your back to sitting on the side of a flat bed without using bedrails?: A Little Help needed moving to and from a bed to a chair (including a wheelchair)?: A Little Help needed standing up from a chair using your arms (e.g., wheelchair or bedside chair)?: A Little Help needed to walk in hospital room?: A Little Help needed climbing 3-5 steps with a railing? : A Little 6 Click Score: 18    End of Session Equipment Utilized During Treatment: Gait belt Activity Tolerance: Patient tolerated treatment well Patient left: in bed;with call bell/phone within reach;with bed alarm set Nurse Communication: Mobility status PT Visit Diagnosis: Difficulty in walking, not elsewhere classified (R26.2)     Time: 9191-9149 PT Time Calculation (min) (ACUTE ONLY): 42 min  Charges:    $Gait Training: 8-22 mins $Therapeutic Exercise: 8-22 mins $Therapeutic Activity: 8-22 mins PT General Charges $$ ACUTE PT VISIT: 1 Visit                     Katrinka Acton PT Acute Rehabilitation Services Pager [phone] Office [phone]    Jarett Dralle 11/02/2023, 12:08 PM

## 2023-11-02 NOTE — Progress Notes (Signed)
 Physical Therapy Treatment Patient Details Name: [name] MRN: [id number] DOB: [birth year] Today's Date: 11/02/2023   History of Present Illness Pt s/p L TKR revision and with hx of bil TKR, PTSD, morbid obesity, panic atacks, Fibromyalgia, cervical fusion and osteoporosis.    PT Comments  PT returned to room as requested by pt for family Ed with spouse and pt.  Pt reviewed HEP including written instructions and spouse in hallway to review stair training with written instruction provided.  Pt eager for return home this date.    If plan is discharge home, recommend the following: A little help with walking and/or transfers;A little help with bathing/dressing/bathroom;Assistance with cooking/housework;Assist for transportation;Help with stairs or ramp for entrance   Can travel by private vehicle        Equipment Recommendations  None recommended by PT    Recommendations for Other Services       Precautions / Restrictions Precautions Precautions: Knee Restrictions Weight Bearing Restrictions Per Provider Order: No Other Position/Activity Restrictions: WBAT     Mobility  Bed Mobility Overal bed mobility: Needs Assistance Bed Mobility: Supine to Sit, Sit to Supine     Supine to sit: Supervision Sit to supine: Contact guard assist   General bed mobility comments: Pt self assisting with gait belt    Transfers Overall transfer level: Needs assistance Equipment used: Rolling walker (2 wheels) Transfers: Sit to/from Stand Sit to Stand: Contact guard assist, Supervision           General transfer comment: cues for LE management and use of UEs to self assist; up/down from EOB and comode    Ambulation/Gait Ambulation/Gait assistance: Contact guard assist Gait Distance (Feet): 75 Feet Assistive device: Rolling walker (2 wheels) Gait Pattern/deviations: Step-to pattern, Step-through pattern, Shuffle, Decreased step length - right, Decreased step length - left Gait  velocity: decr     General Gait Details: increased time with cues for posture, position from RW and initial sequence   Stairs Stairs: Yes Stairs assistance: Min assist Stair Management: No rails, Step to pattern, Backwards, With walker Number of Stairs: 3 General stair comments: Reviewed with spouse 2* pt fatigue   Wheelchair Mobility     Tilt Bed    Modified Rankin (Stroke Patients Only)       Balance Overall balance assessment: Mild deficits observed, not formally tested                                          Communication Communication Communication: No apparent difficulties  Cognition Arousal: Alert Behavior During Therapy: WFL for tasks assessed/performed   PT - Cognitive impairments: No apparent impairments                         Following commands: Intact      Cueing Cueing Techniques: Verbal cues  Exercises Total Joint Exercises Ankle Circles/Pumps: AROM, Supine, Both, 15 reps Quad Sets: AROM, Both, Supine, 5 reps Short Arc Quad: AAROM, Left, Supine, 5 reps Heel Slides: AAROM, Left, Supine, 5 reps Straight Leg Raises: AAROM, Left, Supine, 5 reps    General Comments        Pertinent Vitals/Pain Pain Assessment Pain Assessment: 0-10 Pain Score: 5  Pain Descriptors / Indicators: Aching, Sore Pain Intervention(s): Limited activity within patient's tolerance, Monitored during session, Premedicated before session, Ice applied    Home Living  Prior Function            PT Goals (current goals can now be found in the care plan section) Acute Rehab PT Goals Patient Stated Goal: Regain IND PT Goal Formulation: With patient Time For Goal Achievement: 11/08/23 Potential to Achieve Goals: Good Progress towards PT goals: Progressing toward goals    Frequency    7X/week      PT Plan      Co-evaluation              AM-PAC PT "6 Clicks" Mobility   Outcome Measure   Help needed turning from your back to your side while in a flat bed without using bedrails?: A Little Help needed moving from lying on your back to sitting on the side of a flat bed without using bedrails?: A Little Help needed moving to and from a bed to a chair (including a wheelchair)?: A Little Help needed standing up from a chair using your arms (e.g., wheelchair or bedside chair)?: A Little Help needed to walk in hospital room?: A Little Help needed climbing 3-5 steps with a railing? : A Little 6 Click Score: 18    End of Session Equipment Utilized During Treatment: Gait belt Activity Tolerance: Patient tolerated treatment well Patient left: in bed;with call bell/phone within reach;with bed alarm set Nurse Communication: Mobility status PT Visit Diagnosis: Difficulty in walking, not elsewhere classified (R26.2)     Time: 1610-9604 PT Time Calculation (min) (ACUTE ONLY): 10 min  Charges:    $Gait Training: 8-22 mins $Therapeutic Exercise: 8-22 mins $Therapeutic Activity: 8-22 mins PT General Charges $$ ACUTE PT VISIT: 1 Visit                     Thedora Finlay PT Acute Rehabilitation Services Pager [phone] Office [phone]    Bekki Tavenner 11/02/2023, 12:13 PM

## 2023-11-05 DIAGNOSIS — J3089 Other allergic rhinitis: Secondary | ICD-10-CM | POA: Diagnosis not present

## 2023-11-05 DIAGNOSIS — J3081 Allergic rhinitis due to animal (cat) (dog) hair and dander: Secondary | ICD-10-CM | POA: Diagnosis not present

## 2023-11-05 DIAGNOSIS — J301 Allergic rhinitis due to pollen: Secondary | ICD-10-CM | POA: Diagnosis not present

## 2023-11-05 DIAGNOSIS — M25562 Pain in left knee: Secondary | ICD-10-CM | POA: Diagnosis not present

## 2023-11-05 DIAGNOSIS — M25662 Stiffness of left knee, not elsewhere classified: Secondary | ICD-10-CM | POA: Diagnosis not present

## 2023-11-05 DIAGNOSIS — G8918 Other acute postprocedural pain: Secondary | ICD-10-CM | POA: Diagnosis not present

## 2023-11-08 NOTE — Op Note (Unsigned)
 [name], [name] MEDICAL RECORD NO: [id number] ACCOUNT NO: [id number] DATE OF BIRTH: [birth year] FACILITY: [facility] LOCATION: [id number] PHYSICIAN: Azalea Lento. 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  POSTOPERATIVE DIAGNOSES:  Failed left total knee arthroplasty, failed left femoral component.  PROCEDURE:  Revision left total knee arthroplasty with revision of the femoral component.  COMPONENTS USED:  We used a DePuy Sigma revision femur, TC3 femur, size 2.5 with 8 mm medial and lateral distal augments and a 4 mm posteromedial augment with a small Attune press-fit cone and a 13 x 90 cemented stem.  We used 15 TC3 MBT revision  polyethylene.  FINDINGS:  Intraoperatively identified that the patella had significant parapatellar overgrowth of lateral facet bone, but examination of the patella did not reveal loosening.  SURGEON:  Azalea Lento. Bernard Brick, MD  ASSISTANT:  Kim Pen, PA-C.  Note that, PA, Cleotilde Dago was present for the entirety of the case from preoperative position, perioperative management of the operative extremity, general facilitation of the case and primary wound closure.  ANESTHESIA:  Regional plus spinal.  BLOOD LOSS:  Less than 100 mL.  TOURNIQUET:  225 mmHg for 57 minutes.  DRAINS:  None.  COMPLICATIONS:  None apparent.  INDICATIONS FOR THE PROCEDURE:  The patient is a 58 year old female with a history of left total knee arthroplasty performed 12 years ago.  Her left knee has given her pain throughout this period of time per her report.  There were no wound  complications.  Workup was negative for any clinical concern for infection.  Based on her radiographic appearance and concern for persistent pain and stiffness in her knee, we did order a bone scan.  Bone scan indicated increased uptake around the left  femoral component  as compared to systemic uptake with no evidence of tibial involvement.  There was some questionable concern about the patella with increased uptake as well.  Based on her pain level, she was interested in proceeding with revision total  knee replacement.  We reviewed that we would plan to revise the femoral component and evaluate the patella as well.  There are risks of infection, DVT, and component failure.  It is important for her to continue to work on weight loss based on her BMI  greater than 40.  She also had concerns regarding range of motion and stiffness.  The postoperative course and effort required to maximize her extension and flexion were reviewed and understood.  Consent was obtained for the benefit of pain relief and  improved function.  DESCRIPTION OF PROCEDURE:  The patient was brought to the operative theater.  Once adequate anesthesia, preoperative antibiotics, Ancef  administered as well as tranexamic acid  and Decadron , she was positioned supine with the left thigh tourniquet placed.   The left lower extremity was then prepped and draped in a sterile fashion.  A timeout was performed identifying the patient's planned procedure and extremity.  Leg was exsanguinated and the tourniquet elevated to 225 mmHg.  Her old incision was  excised.  Soft tissue planes were created.  We created then a medial arthrotomy.  We encountered clear synovial fluid.  Initially, I performed a significant scar and synovectomy to remove medial-based scar, suprapatellar scar, and significant  parapatellar scarring.  As I worked around the patella, there was noted to be significant bony ingrowth inferiorly, but particularly laterally in the area where the lateral  facet may have been.  Some of this bone was loose.  Evaluation of the patella did  not reveal gross loosening.  I did debride all this bone around the patella as well as the scar tissue.  Once this was done, I was able to flex the knee.  We removed the old  polyethylene insert.  I then used a thin ACL saw to undermine the cement bone  interface on the femoral side.  The femoral component was removed without difficulty and no significant bone loss.  Once this was done, further debridement of cement was carried out as well as soft tissues around the knee, particularly the femur.  At this point, I prepared the femur per protocol for a TC3 revision femur.  I sized and elected to use a 2.5 femoral component.  Also, during the cuts, I identified that we would use a 4 mm posteromedial augment.  I also determined that I would use a  90-mm cemented stem.  The femur was reamed for this.  During the initial trial reduction, we identified that with a 15-mm insert in place that the flexion balance was good; however, there was hyperextension.  For this reason, I re-trialed with 8 mm  distal medial and lateral augments.  With this, I found that the knee came to full extension and was balanced through flexion.  The patella button tracked through this trochlea without application of pressure.  Given all these findings, we removed all  the trial components.  We selected the final components, which included a TC3 left 2.5 femur with a 2-mm adapter and a +2-degree bolt with a 5-degree adapter and a 13 x 90 cemented stem.  We also selected the 8 mm distal medial and lateral augments as  well as the posteromedial augment.  These were all open and configured on the back table under direct supervision with Kim Pen.  While this was going on, I irrigated the knee with normal saline solution.  We placed a cement restrictor deep.  Once  the components were made and the femur prepared, the final components were cemented into the femur and the knee brought to extension with a 15-mm insert.  Once the cement fully cured, an excessive cement was removed throughout the knee.  Based on the  trial reductions, at this point, the final 15 revision TC3 mobile bearing insert was opened and  placed into the tibia.  The knee was re-irrigated.  The tourniquet was let down after 57 minutes without significant hemostasis required.  Again, I evaluated  the patella and was prepared to revise the patella; however, I did not find that it was grossly loose to probe examination and thus kept the patella due to concerns for potential bone stock.  I feel that some of her patella increased uptake on bone scan  could have been related to the significant bone formation and some of the loosening of the bone around this area.  Given these findings, we irrigated the knee again with normal saline solution.  I did place 1 g of vancomycin  powder in the wound.  The  knee was flexed to 60 degrees and the extensor mechanism reapproximated using #1 Vicryl and #1 Stratafix suture.  The remainder of the wound was closed in layers using 2-0 Vicryl and a running Monocryl stitch.  The wound was clean, dry, and dressed  sterilely using surgical glue and Aquacel dressing.  The patient was then brought to the recovery room in stable condition and tolerated  the procedure well.  Postoperatively, she will be weightbearing as tolerated.  Discharge will be dependent on her  activities with physical therapy.  We will otherwise follow up with her in 2 weeks to assess her range of motion.   PUS D: 11/01/2023 9:52:57 am T: 11/01/2023 11:59:00 am  JOB: [id number]/ [id number]

## 2023-11-09 ENCOUNTER — Encounter (HOSPITAL_COMMUNITY): Payer: Self-pay | Admitting: Orthopedic Surgery
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
## 2023-11-14 DIAGNOSIS — M25562 Pain in left knee: Secondary | ICD-10-CM | POA: Diagnosis not present

## 2023-11-16 DIAGNOSIS — J3081 Allergic rhinitis due to animal (cat) (dog) hair and dander: Secondary | ICD-10-CM | POA: Diagnosis not present

## 2023-11-16 DIAGNOSIS — J301 Allergic rhinitis due to pollen: Secondary | ICD-10-CM | POA: Diagnosis not present

## 2023-11-16 DIAGNOSIS — J3089 Other allergic rhinitis: Secondary | ICD-10-CM | POA: Diagnosis not present

## 2023-11-19 DIAGNOSIS — G8918 Other acute postprocedural pain: Secondary | ICD-10-CM | POA: Diagnosis not present

## 2023-11-19 DIAGNOSIS — M25662 Stiffness of left knee, not elsewhere classified: Secondary | ICD-10-CM | POA: Diagnosis not present

## 2023-11-19 DIAGNOSIS — M25562 Pain in left knee: Secondary | ICD-10-CM | POA: Diagnosis not present

## 2023-11-22 DIAGNOSIS — M25562 Pain in left knee: Secondary | ICD-10-CM | POA: Diagnosis not present

## 2023-11-22 DIAGNOSIS — G8918 Other acute postprocedural pain: Secondary | ICD-10-CM | POA: Diagnosis not present

## 2023-11-22 DIAGNOSIS — M25662 Stiffness of left knee, not elsewhere classified: Secondary | ICD-10-CM | POA: Diagnosis not present

## 2023-11-26 DIAGNOSIS — M25562 Pain in left knee: Secondary | ICD-10-CM | POA: Diagnosis not present

## 2023-11-26 DIAGNOSIS — M25662 Stiffness of left knee, not elsewhere classified: Secondary | ICD-10-CM | POA: Diagnosis not present

## 2023-11-26 DIAGNOSIS — G8918 Other acute postprocedural pain: Secondary | ICD-10-CM | POA: Diagnosis not present

## 2023-11-29 DIAGNOSIS — M25662 Stiffness of left knee, not elsewhere classified: Secondary | ICD-10-CM | POA: Diagnosis not present

## 2023-11-29 DIAGNOSIS — M25562 Pain in left knee: Secondary | ICD-10-CM | POA: Diagnosis not present

## 2023-11-29 DIAGNOSIS — G8918 Other acute postprocedural pain: Secondary | ICD-10-CM | POA: Diagnosis not present

## 2023-11-30 DIAGNOSIS — J301 Allergic rhinitis due to pollen: Secondary | ICD-10-CM | POA: Diagnosis not present

## 2023-11-30 DIAGNOSIS — J3089 Other allergic rhinitis: Secondary | ICD-10-CM | POA: Diagnosis not present

## 2023-11-30 DIAGNOSIS — J3081 Allergic rhinitis due to animal (cat) (dog) hair and dander: Secondary | ICD-10-CM | POA: Diagnosis not present

## 2023-12-03 DIAGNOSIS — M25662 Stiffness of left knee, not elsewhere classified: Secondary | ICD-10-CM | POA: Diagnosis not present

## 2023-12-03 DIAGNOSIS — G8918 Other acute postprocedural pain: Secondary | ICD-10-CM | POA: Diagnosis not present

## 2023-12-03 DIAGNOSIS — M25562 Pain in left knee: Secondary | ICD-10-CM | POA: Diagnosis not present

## 2023-12-05 DIAGNOSIS — M25562 Pain in left knee: Secondary | ICD-10-CM | POA: Diagnosis not present

## 2023-12-05 DIAGNOSIS — M25662 Stiffness of left knee, not elsewhere classified: Secondary | ICD-10-CM | POA: Diagnosis not present

## 2023-12-06 DIAGNOSIS — Z4789 Encounter for other orthopedic aftercare: Secondary | ICD-10-CM | POA: Diagnosis not present

## 2023-12-11 DIAGNOSIS — M25562 Pain in left knee: Secondary | ICD-10-CM | POA: Diagnosis not present

## 2023-12-11 DIAGNOSIS — M25662 Stiffness of left knee, not elsewhere classified: Secondary | ICD-10-CM | POA: Diagnosis not present

## 2023-12-14 DIAGNOSIS — M25662 Stiffness of left knee, not elsewhere classified: Secondary | ICD-10-CM | POA: Diagnosis not present

## 2023-12-14 DIAGNOSIS — M25562 Pain in left knee: Secondary | ICD-10-CM | POA: Diagnosis not present

## 2023-12-14 DIAGNOSIS — J3081 Allergic rhinitis due to animal (cat) (dog) hair and dander: Secondary | ICD-10-CM | POA: Diagnosis not present

## 2023-12-14 DIAGNOSIS — J301 Allergic rhinitis due to pollen: Secondary | ICD-10-CM | POA: Diagnosis not present

## 2023-12-14 DIAGNOSIS — J3089 Other allergic rhinitis: Secondary | ICD-10-CM | POA: Diagnosis not present

## 2023-12-21 DIAGNOSIS — M25662 Stiffness of left knee, not elsewhere classified: Secondary | ICD-10-CM | POA: Diagnosis not present

## 2023-12-21 DIAGNOSIS — M25562 Pain in left knee: Secondary | ICD-10-CM | POA: Diagnosis not present

## 2023-12-25 DIAGNOSIS — M25662 Stiffness of left knee, not elsewhere classified: Secondary | ICD-10-CM | POA: Diagnosis not present

## 2023-12-25 DIAGNOSIS — G8918 Other acute postprocedural pain: Secondary | ICD-10-CM | POA: Diagnosis not present

## 2023-12-25 DIAGNOSIS — M25562 Pain in left knee: Secondary | ICD-10-CM | POA: Diagnosis not present

## 2023-12-28 DIAGNOSIS — J3081 Allergic rhinitis due to animal (cat) (dog) hair and dander: Secondary | ICD-10-CM | POA: Diagnosis not present

## 2023-12-28 DIAGNOSIS — J301 Allergic rhinitis due to pollen: Secondary | ICD-10-CM | POA: Diagnosis not present

## 2023-12-28 DIAGNOSIS — M25562 Pain in left knee: Secondary | ICD-10-CM | POA: Diagnosis not present

## 2023-12-28 DIAGNOSIS — J3089 Other allergic rhinitis: Secondary | ICD-10-CM | POA: Diagnosis not present

## 2023-12-28 DIAGNOSIS — M25662 Stiffness of left knee, not elsewhere classified: Secondary | ICD-10-CM | POA: Diagnosis not present

## 2024-01-01 DIAGNOSIS — M25662 Stiffness of left knee, not elsewhere classified: Secondary | ICD-10-CM | POA: Diagnosis not present

## 2024-01-01 DIAGNOSIS — M25562 Pain in left knee: Secondary | ICD-10-CM | POA: Diagnosis not present

## 2024-01-04 DIAGNOSIS — M25562 Pain in left knee: Secondary | ICD-10-CM | POA: Diagnosis not present

## 2024-01-04 DIAGNOSIS — M25662 Stiffness of left knee, not elsewhere classified: Secondary | ICD-10-CM | POA: Diagnosis not present

## 2024-01-07 DIAGNOSIS — M25662 Stiffness of left knee, not elsewhere classified: Secondary | ICD-10-CM | POA: Diagnosis not present

## 2024-01-07 DIAGNOSIS — M25562 Pain in left knee: Secondary | ICD-10-CM | POA: Diagnosis not present

## 2024-01-11 DIAGNOSIS — J3089 Other allergic rhinitis: Secondary | ICD-10-CM | POA: Diagnosis not present

## 2024-01-11 DIAGNOSIS — J3081 Allergic rhinitis due to animal (cat) (dog) hair and dander: Secondary | ICD-10-CM | POA: Diagnosis not present

## 2024-01-11 DIAGNOSIS — J301 Allergic rhinitis due to pollen: Secondary | ICD-10-CM | POA: Diagnosis not present

## 2024-01-16 DIAGNOSIS — J3089 Other allergic rhinitis: Secondary | ICD-10-CM | POA: Diagnosis not present

## 2024-01-16 DIAGNOSIS — J3081 Allergic rhinitis due to animal (cat) (dog) hair and dander: Secondary | ICD-10-CM | POA: Diagnosis not present

## 2024-01-16 DIAGNOSIS — J301 Allergic rhinitis due to pollen: Secondary | ICD-10-CM | POA: Diagnosis not present

## 2024-01-17 DIAGNOSIS — J3081 Allergic rhinitis due to animal (cat) (dog) hair and dander: Secondary | ICD-10-CM | POA: Diagnosis not present

## 2024-01-17 DIAGNOSIS — J3089 Other allergic rhinitis: Secondary | ICD-10-CM | POA: Diagnosis not present

## 2024-01-17 DIAGNOSIS — J301 Allergic rhinitis due to pollen: Secondary | ICD-10-CM | POA: Diagnosis not present

## 2024-01-25 DIAGNOSIS — J3089 Other allergic rhinitis: Secondary | ICD-10-CM | POA: Diagnosis not present

## 2024-01-25 DIAGNOSIS — J3081 Allergic rhinitis due to animal (cat) (dog) hair and dander: Secondary | ICD-10-CM | POA: Diagnosis not present

## 2024-01-25 DIAGNOSIS — J301 Allergic rhinitis due to pollen: Secondary | ICD-10-CM | POA: Diagnosis not present

## 2024-01-30 DIAGNOSIS — E559 Vitamin D deficiency, unspecified: Secondary | ICD-10-CM | POA: Diagnosis not present

## 2024-01-30 DIAGNOSIS — E039 Hypothyroidism, unspecified: Secondary | ICD-10-CM | POA: Diagnosis not present

## 2024-02-01 DIAGNOSIS — J3089 Other allergic rhinitis: Secondary | ICD-10-CM | POA: Diagnosis not present

## 2024-02-01 DIAGNOSIS — J301 Allergic rhinitis due to pollen: Secondary | ICD-10-CM | POA: Diagnosis not present

## 2024-02-01 DIAGNOSIS — J3081 Allergic rhinitis due to animal (cat) (dog) hair and dander: Secondary | ICD-10-CM | POA: Diagnosis not present

## 2024-02-08 DIAGNOSIS — J301 Allergic rhinitis due to pollen: Secondary | ICD-10-CM | POA: Diagnosis not present

## 2024-02-08 DIAGNOSIS — J3081 Allergic rhinitis due to animal (cat) (dog) hair and dander: Secondary | ICD-10-CM | POA: Diagnosis not present

## 2024-02-08 DIAGNOSIS — J3089 Other allergic rhinitis: Secondary | ICD-10-CM | POA: Diagnosis not present

## 2024-02-15 DIAGNOSIS — J3081 Allergic rhinitis due to animal (cat) (dog) hair and dander: Secondary | ICD-10-CM | POA: Diagnosis not present

## 2024-02-15 DIAGNOSIS — J301 Allergic rhinitis due to pollen: Secondary | ICD-10-CM | POA: Diagnosis not present

## 2024-02-15 DIAGNOSIS — J3089 Other allergic rhinitis: Secondary | ICD-10-CM | POA: Diagnosis not present

## 2024-02-22 DIAGNOSIS — J301 Allergic rhinitis due to pollen: Secondary | ICD-10-CM | POA: Diagnosis not present

## 2024-02-22 DIAGNOSIS — J3089 Other allergic rhinitis: Secondary | ICD-10-CM | POA: Diagnosis not present

## 2024-02-22 DIAGNOSIS — J3081 Allergic rhinitis due to animal (cat) (dog) hair and dander: Secondary | ICD-10-CM | POA: Diagnosis not present

## 2024-03-04 DIAGNOSIS — E1169 Type 2 diabetes mellitus with other specified complication: Secondary | ICD-10-CM | POA: Diagnosis not present

## 2024-03-04 DIAGNOSIS — E7800 Pure hypercholesterolemia, unspecified: Secondary | ICD-10-CM | POA: Diagnosis not present

## 2024-03-10 DIAGNOSIS — J301 Allergic rhinitis due to pollen: Secondary | ICD-10-CM | POA: Diagnosis not present

## 2024-03-10 DIAGNOSIS — J3081 Allergic rhinitis due to animal (cat) (dog) hair and dander: Secondary | ICD-10-CM | POA: Diagnosis not present

## 2024-03-10 DIAGNOSIS — J3089 Other allergic rhinitis: Secondary | ICD-10-CM | POA: Diagnosis not present

## 2024-03-24 DIAGNOSIS — J3089 Other allergic rhinitis: Secondary | ICD-10-CM | POA: Diagnosis not present

## 2024-03-24 DIAGNOSIS — J301 Allergic rhinitis due to pollen: Secondary | ICD-10-CM | POA: Diagnosis not present

## 2024-03-24 DIAGNOSIS — J3081 Allergic rhinitis due to animal (cat) (dog) hair and dander: Secondary | ICD-10-CM | POA: Diagnosis not present

## 2024-04-07 DIAGNOSIS — K5900 Constipation, unspecified: Secondary | ICD-10-CM | POA: Diagnosis not present

## 2024-04-07 DIAGNOSIS — J301 Allergic rhinitis due to pollen: Secondary | ICD-10-CM | POA: Diagnosis not present

## 2024-04-07 DIAGNOSIS — J3081 Allergic rhinitis due to animal (cat) (dog) hair and dander: Secondary | ICD-10-CM | POA: Diagnosis not present

## 2024-04-07 DIAGNOSIS — Z1211 Encounter for screening for malignant neoplasm of colon: Secondary | ICD-10-CM | POA: Diagnosis not present

## 2024-04-07 DIAGNOSIS — E7800 Pure hypercholesterolemia, unspecified: Secondary | ICD-10-CM | POA: Diagnosis not present

## 2024-04-07 DIAGNOSIS — F331 Major depressive disorder, recurrent, moderate: Secondary | ICD-10-CM | POA: Diagnosis not present

## 2024-04-07 DIAGNOSIS — J3089 Other allergic rhinitis: Secondary | ICD-10-CM | POA: Diagnosis not present

## 2024-04-07 DIAGNOSIS — E1169 Type 2 diabetes mellitus with other specified complication: Secondary | ICD-10-CM | POA: Diagnosis not present

## 2024-04-21 ENCOUNTER — Other Ambulatory Visit: Payer: Self-pay | Admitting: Family Medicine

## 2024-04-21 DIAGNOSIS — J301 Allergic rhinitis due to pollen: Secondary | ICD-10-CM | POA: Diagnosis not present

## 2024-04-21 DIAGNOSIS — J3081 Allergic rhinitis due to animal (cat) (dog) hair and dander: Secondary | ICD-10-CM | POA: Diagnosis not present

## 2024-04-21 DIAGNOSIS — J3089 Other allergic rhinitis: Secondary | ICD-10-CM | POA: Diagnosis not present

## 2024-04-21 DIAGNOSIS — Z1231 Encounter for screening mammogram for malignant neoplasm of breast: Secondary | ICD-10-CM

## 2024-05-06 DIAGNOSIS — J301 Allergic rhinitis due to pollen: Secondary | ICD-10-CM | POA: Diagnosis not present

## 2024-05-06 DIAGNOSIS — J3089 Other allergic rhinitis: Secondary | ICD-10-CM | POA: Diagnosis not present

## 2024-05-06 DIAGNOSIS — J3081 Allergic rhinitis due to animal (cat) (dog) hair and dander: Secondary | ICD-10-CM | POA: Diagnosis not present

## 2024-05-08 ENCOUNTER — Ambulatory Visit
Admission: RE | Admit: 2024-05-08 | Discharge: 2024-05-08 | Disposition: A | Discharge status: Home / Self Care | Source: Ambulatory Visit | Attending: Family Medicine | Admitting: Family Medicine

## 2024-05-08 DIAGNOSIS — Z1231 Encounter for screening mammogram for malignant neoplasm of breast: Secondary | ICD-10-CM

## 2024-05-12 DIAGNOSIS — H524 Presbyopia: Secondary | ICD-10-CM | POA: Diagnosis not present

## 2024-05-12 DIAGNOSIS — H52223 Regular astigmatism, bilateral: Secondary | ICD-10-CM | POA: Diagnosis not present

## 2024-05-15 ENCOUNTER — Encounter: Payer: Self-pay | Admitting: Gastroenterology

## 2024-05-20 DIAGNOSIS — J3089 Other allergic rhinitis: Secondary | ICD-10-CM | POA: Diagnosis not present

## 2024-05-20 DIAGNOSIS — J3081 Allergic rhinitis due to animal (cat) (dog) hair and dander: Secondary | ICD-10-CM | POA: Diagnosis not present

## 2024-05-20 DIAGNOSIS — J301 Allergic rhinitis due to pollen: Secondary | ICD-10-CM | POA: Diagnosis not present

## 2024-05-27 DIAGNOSIS — Z8742 Personal history of other diseases of the female genital tract: Secondary | ICD-10-CM | POA: Diagnosis not present

## 2024-05-27 DIAGNOSIS — Z124 Encounter for screening for malignant neoplasm of cervix: Secondary | ICD-10-CM | POA: Diagnosis not present

## 2024-05-27 DIAGNOSIS — Z01419 Encounter for gynecological examination (general) (routine) without abnormal findings: Secondary | ICD-10-CM | POA: Diagnosis not present

## 2024-06-04 DIAGNOSIS — J301 Allergic rhinitis due to pollen: Secondary | ICD-10-CM | POA: Diagnosis not present

## 2024-06-04 DIAGNOSIS — J3089 Other allergic rhinitis: Secondary | ICD-10-CM | POA: Diagnosis not present

## 2024-06-04 DIAGNOSIS — J3081 Allergic rhinitis due to animal (cat) (dog) hair and dander: Secondary | ICD-10-CM | POA: Diagnosis not present

## 2024-06-17 DIAGNOSIS — J3081 Allergic rhinitis due to animal (cat) (dog) hair and dander: Secondary | ICD-10-CM | POA: Diagnosis not present

## 2024-06-17 DIAGNOSIS — J301 Allergic rhinitis due to pollen: Secondary | ICD-10-CM | POA: Diagnosis not present

## 2024-06-17 DIAGNOSIS — J3089 Other allergic rhinitis: Secondary | ICD-10-CM | POA: Diagnosis not present

## 2024-06-18 ENCOUNTER — Ambulatory Visit: Admitting: Gastroenterology

## 2024-07-01 DIAGNOSIS — J301 Allergic rhinitis due to pollen: Secondary | ICD-10-CM | POA: Diagnosis not present

## 2024-07-01 DIAGNOSIS — J3089 Other allergic rhinitis: Secondary | ICD-10-CM | POA: Diagnosis not present

## 2024-07-01 DIAGNOSIS — J3081 Allergic rhinitis due to animal (cat) (dog) hair and dander: Secondary | ICD-10-CM | POA: Diagnosis not present

## 2024-07-29 ENCOUNTER — Encounter: Payer: Self-pay | Admitting: Gastroenterology

## 2024-07-29 ENCOUNTER — Telehealth: Payer: Self-pay

## 2024-07-29 ENCOUNTER — Ambulatory Visit: Admitting: Gastroenterology

## 2024-07-29 VITALS — BP 130/84 | HR 91 | Ht 62.0 in | Wt 263.0 lb

## 2024-07-29 DIAGNOSIS — Z79899 Other long term (current) drug therapy: Secondary | ICD-10-CM | POA: Diagnosis not present

## 2024-07-29 DIAGNOSIS — R194 Change in bowel habit: Secondary | ICD-10-CM

## 2024-07-29 DIAGNOSIS — K219 Gastro-esophageal reflux disease without esophagitis: Secondary | ICD-10-CM

## 2024-07-29 DIAGNOSIS — M8580 Other specified disorders of bone density and structure, unspecified site: Secondary | ICD-10-CM | POA: Diagnosis not present

## 2024-07-29 DIAGNOSIS — T884XXS Failed or difficult intubation, sequela: Secondary | ICD-10-CM | POA: Diagnosis not present

## 2024-07-29 MED ORDER — OMEPRAZOLE 40 MG PO CPDR: DELAYED_RELEASE_CAPSULE | ORAL | 3 refills | Status: AC
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
## 2024-07-29 NOTE — Patient Instructions (Signed)
 You have been scheduled for an endoscopy at [facility] on March 9th. Please follow written instructions given to you at your visit today.  If you use inhalers (even only as needed), please bring them with you on the day of your procedure.  If you take any of the following medications, they will need to be adjusted prior to your procedure:   DO NOT TAKE 7 DAYS PRIOR TO TEST- Trulicity (dulaglutide) Ozempic, Wegovy (semaglutide) Mounjaro, Zepbound (tirzepatide) Bydureon Bcise (exanatide extended release)  DO NOT TAKE 1 DAY PRIOR TO YOUR TEST Rybelsus (semaglutide) Adlyxin (lixisenatide) Victoza (liraglutide) Byetta (exanatide) ____________________________________________________________  We have sent the following medications to your pharmacy for you to pick up at your convenience: Omeprazole  40 mg: Take once or twice daily as needed  Thank you for entrusting me with your care and for choosing [facility], Dr. Elspeth Naval   _______________________________________________________  If your blood pressure at your visit was 140/90 or greater, please contact your primary care physician to follow up on this.  _______________________________________________________  If you are age 17 or older, your body mass index should be between 23-30. Your Body mass index is 48.1 kg/m. If this is out of the aforementioned range listed, please consider follow up with your Primary Care Provider.  If you are age 41 or younger, your body mass index should be between 19-25. Your Body mass index is 48.1 kg/m. If this is out of the aformentioned range listed, please consider follow up with your Primary Care Provider.   ________________________________________________________  The [facility] providers would like to encourage you to use MYCHART to communicate with providers for non-urgent requests or questions.  Due to long hold times on the telephone, sending your provider a message by  [name] may be a faster and more efficient way to get a response.  Please allow 48 business hours for a response.  Please remember that this is for non-urgent requests.  _______________________________________________________  [facility] is using a team-based approach to care.  Your team is made up of your doctor and two to three APPS. Our APPS (Nurse Practitioners and Physician Assistants) work with your physician to ensure care continuity for you. They are fully qualified to address your health concerns and develop a treatment plan. They communicate directly with your gastroenterologist to care for you. Seeing the Advanced Practice Practitioners on your physician's team can help you by facilitating care more promptly, often allowing for earlier appointments, access to diagnostic testing, procedures, and other specialty referrals.

## 2024-07-29 NOTE — Telephone Encounter (Signed)
 Patient is scheduled for EGD at [facility] on 3-9.  She is on Ozempic but it was not on her med list. Called and spoke to patient to make sure she is aware she has to hold for 7 days before the procedure.  She confirmed she did see that in her instructions and she has noted on her calendar to hold Ozempic for 7 days prior to procedure. Medication added to med list

## 2024-08-19 ENCOUNTER — Telehealth: Payer: Self-pay

## 2024-08-21 NOTE — Progress Notes (Signed)
 [name]                                          MRN: [id number]   08/21/2024   The [facility] Quality Team Specialist reviewed this patient medical record for the purposes of chart review for care gap closure. The following were reviewed: chart review for care gap closure-diabetic eye exam.    [facility] Quality Team

## 2024-08-22 ENCOUNTER — Telehealth: Payer: Self-pay

## 2024-08-22 NOTE — Telephone Encounter (Signed)
 Procedure:EGD Procedure date: 09/01/2024 Procedure location: [facility] Arrival Time: 7:08 Spoke with the patient Y/N: Y Any prep concerns? N Has the patient obtained the prep from the pharmacy ? N Do you have a care partner and transportation: Y Any additional concerns? N

## 2024-09-01 ENCOUNTER — Encounter (HOSPITAL_COMMUNITY): Payer: Self-pay

## 2024-09-01 ENCOUNTER — Ambulatory Visit (HOSPITAL_COMMUNITY): Admit: 2024-09-01 | Admitting: Gastroenterology
